# Patient Record
Sex: Female | Born: 1955
Health system: Southern US, Community
[De-identification: ages and names within clinical notes are randomized; demographics above are authoritative.]

## PROBLEM LIST (undated history)

## (undated) DIAGNOSIS — R011 Cardiac murmur, unspecified: Secondary | ICD-10-CM

## (undated) DIAGNOSIS — I10 Essential (primary) hypertension: Secondary | ICD-10-CM

## (undated) DIAGNOSIS — E119 Type 2 diabetes mellitus without complications: Secondary | ICD-10-CM

## (undated) DIAGNOSIS — G4730 Sleep apnea, unspecified: Secondary | ICD-10-CM

## (undated) DIAGNOSIS — E785 Hyperlipidemia, unspecified: Secondary | ICD-10-CM

## (undated) DIAGNOSIS — F32A Depression, unspecified: Secondary | ICD-10-CM

## (undated) DIAGNOSIS — R000 Tachycardia, unspecified: Secondary | ICD-10-CM

## (undated) DIAGNOSIS — I499 Cardiac arrhythmia, unspecified: Secondary | ICD-10-CM

## (undated) DIAGNOSIS — F419 Anxiety disorder, unspecified: Secondary | ICD-10-CM

## (undated) HISTORY — DX: Hyperlipidemia, unspecified: E78.5

## (undated) HISTORY — DX: Type 2 diabetes mellitus without complications: E11.9

## (undated) HISTORY — DX: Essential (primary) hypertension: I10

## (undated) HISTORY — PX: APPENDECTOMY: SHX54

---
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
## 1978-07-29 DIAGNOSIS — A159 Respiratory tuberculosis unspecified: Secondary | ICD-10-CM

## 1978-07-29 HISTORY — DX: Respiratory tuberculosis unspecified: A15.9

## 1998-06-06 ENCOUNTER — Other Ambulatory Visit: Admission: RE | Admit: 1998-06-06 | Discharge: 1998-06-06 | Payer: Self-pay | Admitting: Obstetrics & Gynecology

## 1999-01-13 ENCOUNTER — Encounter: Payer: Self-pay | Admitting: Emergency Medicine

## 1999-01-13 ENCOUNTER — Emergency Department (HOSPITAL_COMMUNITY): Admission: EM | Admit: 1999-01-13 | Discharge: 1999-01-13 | Payer: Self-pay | Admitting: Emergency Medicine

## 1999-06-28 ENCOUNTER — Other Ambulatory Visit: Admission: RE | Admit: 1999-06-28 | Discharge: 1999-06-28 | Payer: Self-pay | Admitting: Obstetrics & Gynecology

## 1999-07-29 ENCOUNTER — Ambulatory Visit (HOSPITAL_COMMUNITY): Admission: RE | Admit: 1999-07-29 | Discharge: 1999-07-29 | Payer: Self-pay | Admitting: *Deleted

## 2000-04-24 ENCOUNTER — Encounter: Payer: Self-pay | Admitting: Emergency Medicine

## 2000-04-24 ENCOUNTER — Emergency Department (HOSPITAL_COMMUNITY): Admission: EM | Admit: 2000-04-24 | Discharge: 2000-04-24 | Payer: Self-pay | Admitting: Emergency Medicine

## 2000-09-04 ENCOUNTER — Other Ambulatory Visit: Admission: RE | Admit: 2000-09-04 | Discharge: 2000-09-04 | Payer: Self-pay | Admitting: Obstetrics & Gynecology

## 2000-11-13 ENCOUNTER — Encounter: Payer: Self-pay | Admitting: Emergency Medicine

## 2000-11-13 ENCOUNTER — Emergency Department (HOSPITAL_COMMUNITY): Admission: EM | Admit: 2000-11-13 | Discharge: 2000-11-13 | Payer: Self-pay | Admitting: Emergency Medicine

## 2002-02-08 ENCOUNTER — Emergency Department (HOSPITAL_COMMUNITY): Admission: EM | Admit: 2002-02-08 | Discharge: 2002-02-08 | Payer: Self-pay | Admitting: *Deleted

## 2002-04-21 ENCOUNTER — Other Ambulatory Visit: Admission: RE | Admit: 2002-04-21 | Discharge: 2002-04-21 | Payer: Self-pay | Admitting: Obstetrics & Gynecology

## 2006-12-19 ENCOUNTER — Ambulatory Visit (HOSPITAL_COMMUNITY): Admission: RE | Admit: 2006-12-19 | Discharge: 2006-12-19 | Payer: Self-pay | Admitting: Obstetrics & Gynecology

## 2007-04-28 ENCOUNTER — Encounter: Admission: RE | Admit: 2007-04-28 | Discharge: 2007-04-28 | Payer: Self-pay | Admitting: Obstetrics & Gynecology

## 2007-12-09 ENCOUNTER — Ambulatory Visit: Payer: Self-pay | Admitting: Cardiovascular Disease

## 2007-12-10 ENCOUNTER — Encounter: Payer: Self-pay | Admitting: Cardiovascular Disease

## 2007-12-10 ENCOUNTER — Ambulatory Visit: Payer: Self-pay

## 2007-12-22 ENCOUNTER — Ambulatory Visit: Payer: Self-pay | Admitting: Internal Medicine

## 2008-01-22 ENCOUNTER — Encounter: Admission: RE | Admit: 2008-01-22 | Discharge: 2008-01-22 | Payer: Self-pay | Admitting: Obstetrics & Gynecology

## 2008-07-18 DIAGNOSIS — E782 Mixed hyperlipidemia: Secondary | ICD-10-CM | POA: Insufficient documentation

## 2008-07-18 DIAGNOSIS — I471 Supraventricular tachycardia: Secondary | ICD-10-CM | POA: Insufficient documentation

## 2008-07-18 DIAGNOSIS — R002 Palpitations: Secondary | ICD-10-CM | POA: Insufficient documentation

## 2008-07-18 DIAGNOSIS — I10 Essential (primary) hypertension: Secondary | ICD-10-CM | POA: Insufficient documentation

## 2009-09-19 ENCOUNTER — Encounter: Admission: RE | Admit: 2009-09-19 | Discharge: 2009-09-19 | Payer: Self-pay | Admitting: Family Medicine

## 2009-11-10 ENCOUNTER — Emergency Department (HOSPITAL_COMMUNITY): Admission: EM | Admit: 2009-11-10 | Discharge: 2009-11-10 | Payer: Self-pay | Admitting: Emergency Medicine

## 2009-11-21 ENCOUNTER — Ambulatory Visit: Payer: Self-pay | Admitting: Internal Medicine

## 2009-11-21 DIAGNOSIS — I951 Orthostatic hypotension: Secondary | ICD-10-CM | POA: Insufficient documentation

## 2009-11-21 DIAGNOSIS — R079 Chest pain, unspecified: Secondary | ICD-10-CM | POA: Insufficient documentation

## 2009-11-21 DIAGNOSIS — R55 Syncope and collapse: Secondary | ICD-10-CM | POA: Insufficient documentation

## 2009-11-27 ENCOUNTER — Encounter: Payer: Self-pay | Admitting: Internal Medicine

## 2009-11-29 ENCOUNTER — Telehealth (INDEPENDENT_AMBULATORY_CARE_PROVIDER_SITE_OTHER): Payer: Self-pay | Admitting: *Deleted

## 2009-11-30 ENCOUNTER — Encounter (HOSPITAL_COMMUNITY): Admission: RE | Admit: 2009-11-30 | Discharge: 2010-01-26 | Payer: Self-pay | Admitting: Internal Medicine

## 2009-11-30 ENCOUNTER — Ambulatory Visit: Payer: Self-pay | Admitting: Internal Medicine

## 2009-11-30 ENCOUNTER — Encounter (INDEPENDENT_AMBULATORY_CARE_PROVIDER_SITE_OTHER): Payer: Self-pay | Admitting: *Deleted

## 2009-11-30 ENCOUNTER — Ambulatory Visit: Payer: Self-pay

## 2010-02-12 ENCOUNTER — Ambulatory Visit: Payer: Self-pay | Admitting: Internal Medicine

## 2010-06-29 ENCOUNTER — Encounter: Admission: RE | Admit: 2010-06-29 | Discharge: 2010-06-29 | Payer: Self-pay | Admitting: Family Medicine

## 2010-07-20 ENCOUNTER — Encounter
Admission: RE | Admit: 2010-07-20 | Discharge: 2010-07-20 | Payer: Self-pay | Source: Home / Self Care | Attending: Family Medicine | Admitting: Family Medicine

## 2010-08-28 NOTE — Miscellaneous (Signed)
Summary: Orders Update  Clinical Lists Changes  Orders: Added new Referral order of Nuclear Stress Test (Nuc Stress Test) - Signed 

## 2010-08-28 NOTE — Letter (Signed)
Summary: Outpatient Coinsurance Notice  Outpatient Coinsurance Notice   Imported By: Marylou Mccoy 12/01/2009 15:33:35  _____________________________________________________________________  External Attachment:    Type:   Image     Comment:   External Document

## 2010-08-28 NOTE — Assessment & Plan Note (Signed)
Summary: f2y   CC:  pt passed out February 6 of this year.  This had caused pt to have a concussion. Pt quit smoking March 17 and 2011.Marland Kitchen  History of Present Illness: [name] is seen at the request of Dr. Tenny Craw because of syncope.  We first met many years ago when she had SVT. It was a adenosin sensitive. She had recurrent episode of SVT for which Dr. Tenny Craw obtained electrogram. This was sinus tachycardia.  Her issues now are quite different. It turns that she has a long-standing I. a greater than 30 year history of hypertension.   She's had problems with orthostatic intolerance for some time but in February she fell and cut her head on a chair. EMS was called the patient remembers being told her blood pressure was low her husband has no recollection. Since that time she is recorded her blood pressures at home and as noted blood pressure falls from sitting to standing of 30-50 mm this is with heart rate increases of 10-30 beats per minute. These have been symptomatic. She also has shower intolerance;  standing intolerance and heat intolerance. in the interim, her blood pressure medications have had to be down titrated.   She has a severe back problems she spends a lot of her time in bed.    She's also had significant problems of late with dyspnea on exertion as well as exertional chest discomfort. These have worsened over the last 6-12 months.  She has a strong family history of heart disease. She is abnormal cholesterol. she did stop smoking a couple of months ago.  After recent fall, she was evaluated in the hospital. She was noted to have vascular disease after her carotid arteries were noted to be calcified on x-ray. She was seen by Dr. early  Current Medications (verified): 1)  Activella 1-0.5 Mg Tabs (Estradiol-Norethindrone Acet) .... Once Daily 2)  Labetalol Hcl 300 Mg Tabs (Labetalol Hcl) .... Take 1/2 Tablet Two Times A Day 3)  Micardis Hct 80-12.5 Mg Tabs (Telmisartan-Hctz) ....  1/2 Tablet Once Daily 4)  Pravachol 20 Mg Tabs (Pravastatin Sodium) .... Take One Tablet Once Daily 5)  Sertraline Hcl 50 Mg Tabs (Sertraline Hcl) .... Take One Tablet Once Daily 6)  Vicodin 5-500 Mg Tabs (Hydrocodone-Acetaminophen) .... As Needed For Pain 7)  Xanax 1 Mg Tabs (Alprazolam) .... Take One Tablet Daily  Allergies (verified): 1)  ! Penicillin  Past History:  Past Medical History: Last updated: 30-Jul-2008 PSVT smoking clinical bronchitis Anxiety Hyperlipidemia Hypertension  Family History: Last updated: 07/30/2008 Mother: Deceased at 38 Heart attack brother alive has blockage Father: father alive had bypass surgery  Social History: Last updated: 11/21/2009 Ongoing heavy cigarrete use. Retired  Tobacco Use - Yes. recently stopped 4 children  Past Surgical History: appendectomy Abdominal surgery Pregnancy surgeries Rectocele Tonsillectomy  Social History: Ongoing heavy cigarrete use. Retired  Tobacco Use - Yes. recently stopped 4 children  Vital Signs:  Patient profile:   55 year old female Height:      56 inches Weight:      158 pounds BMI:     35.55 Pulse rate:   61 / minute Pulse rhythm:   regular BP sitting:   140 / 72  (left arm) Cuff size:   large  Vitals Entered By: Judithe Modest CMA (November 21, 2009 3:35 PM)  Physical Exam  General:  Alert and oriented middle aged caucasion female appearing older than her stated age in no acute distress. HEENT  normal . Neck veins were flat; carotids brisk and full without bruits. No lymphadenopathy. Back without kyphosis. Lungs clear. Heart sounds regular without murmurs or gallops. PMI nondisplaced. Abdomen soft with active bowel sounds without midline pulsation or hepatomegaly. Femoral pulses and distal pulses intact. Extremities were without clubbing cyanosis or edemaSkin warm and dry. Neurological exam grossly normal. affect flat     Impression & Recommendations:  Problem # 1:  HYPOTENSION,  ORTHOSTATIC (ICD-458.0) Pt has significant orthostasis which she documents with her home machine.  In the context of long-standing hypertension this is likely related to vascular stiffness aggravated by her prolonged bed rest issues.  Treatment options would include raising the head of bed 6 inches, increasing volume, using isometric maneuvers, It and not hot baths, as well as possibly Mestinon. I would like to avoid medications right now. Also like to avoid compression hose.  Problem # 2:  CHEST PAIN (ICD-786.50) The patient has multiple risk factors for coronary disease. She has exertional chest discomfort and shortness of breath. I was to rule out recurrent admissions to undergo Myoview scanning. She is unable to walk because of her back. We will need to look at structural integrity of her heart and evidence of diastolic heart failure by 2-D echocardiography.  congratulations on worse and she stop smoking Her updated medication list for this problem includes:    Labetalol Hcl 300 Mg Tabs (Labetalol hcl) .Marland Kitchen... Take 1/2 tablet two times a day  Problem # 3:  HYPERTENSION, BENIGN (ICD-401.1) her blood pressure is quite significant. At this point her symptoms of dizziness are overwhelming and I agree with the don't titration of her antihypertensives. Her updated medication list for this problem includes:    Labetalol Hcl 300 Mg Tabs (Labetalol hcl) .Marland Kitchen... Take 1/2 tablet two times a day    Micardis Hct 80-12.5 Mg Tabs (Telmisartan-hctz) .Marland Kitchen... 1/2 tablet once daily  Other Orders: EKG w/ Interpretation (93000)  Patient Instructions: 1)  Your physician recommends that you schedule a follow-up appointment in: 3 months with Dr Graciela Husbands.

## 2010-08-28 NOTE — Assessment & Plan Note (Signed)
Summary: Cardiology Nuclear Study  Nuclear Med Background Indications for Stress Test: Evaluation for Ischemia   History: Myocardial Perfusion Study  History Comments: '03 ZOX:WRUEAV, EF=64%; h/o PSVT  Symptoms: Chest Pain, Chest Pain with Exertion, Dizziness, DOE, Palpitations, Rapid HR, SOB, Syncope  Symptoms Comments: Last episode of CP:2 days ago.   Nuclear Pre-Procedure Cardiac Risk Factors: Carotid Disease, Family History - CAD, History of Smoking, Hypertension, Lipids Caffeine/Decaff Intake: None NPO After: 7:30 PM Lungs: Clear.  O2 Sat 98% on RA. IV 0.9% NS with Angio Cath: 22g     IV Site: (R) AC IV Started by: Irean Hong RN Chest Size (in) 36     Cup Size C     Height (in): 63 Weight (lb): 161 BMI: 28.62 Tech Comments: Labetalol held x 12 hours. Recently quit smoking, 10/12/09.  Nuclear Med Study 1 or 2 day study:  1 day     Stress Test Type:  Eugenie Birks Reading MD:  Arvilla Meres, MD     Referring MD:  Berton Mount, MD Resting Radionuclide:  Technetium 13m Tetrofosmin     Resting Radionuclide Dose:  11.0 mCi  Stress Radionuclide:  Technetium 96m Tetrofosmin     Stress Radionuclide Dose:  33.0 mCi   Stress Protocol   Lexiscan: 0.4 mg   Stress Test Technologist:  Rea College CMA-N     Nuclear Technologist:  Domenic Polite CNMT  Rest Procedure  Myocardial perfusion imaging was performed at rest 45 minutes following the intravenous administration of Myoview Technetium 32m Tetrofosmin.  Stress Procedure  The patient received IV Lexiscan 0.4 mg over 15-seconds.  Myoview injected at 30-seconds.  There were minor nonspecific ST-T wave changes with lexiscan.  She did c/o chest tightness with lexiscan.  Quantitative spect images were obtained after a 45 minute delay.  QPS Raw Data Images:  Normal; no motion artifact; normal heart/lung ratio. Stress Images:  There is normal uptake in all areas. Rest Images:  Normal homogeneous uptake in all areas of the  myocardium. Subtraction (SDS):  Normal Transient Ischemic Dilatation:  1.06  (Normal <1.22)  Lung/Heart Ratio:  .24  (Normal <0.45)  Quantitative Gated Spect Images QGS EDV:  101 ml QGS ESV:  36 ml QGS EF:  64 % QGS cine images:  Normal  Findings Normal nuclear study      Overall Impression  Exercise Capacity: Lexiscan study with no exercise. ECG Impression: Baseline: NSR; No significant ST segment change with Lexiscan. Overall Impression: Normal stress nuclear study.  Appended Document: Cardiology Nuclear Study Patient notified.

## 2010-08-28 NOTE — Assessment & Plan Note (Signed)
Summary: 3 month rov/sl   CC:  3 month rov.  Pt states she is a little stressed at this time.  She is having a hard time regulating her BP and pulse.  Marland Kitchen  History of Present Illness: [name] is seen in followup for hypertension and orthostatic hypotension as well as a remote history of adenosine sensitive to SVT.  In the interim she has stopped smoking. Her blood pressure is now running lower. She feels her dizziness however is still a problem. Theree remains dyspnea on exertion.  Current Medications (verified): 1)  Activella 1-0.5 Mg Tabs (Estradiol-Norethindrone Acet) .... Once Daily 2)  Labetalol Hcl 300 Mg Tabs (Labetalol Hcl) .... Take 1/2 Tablet Once Daily 3)  Micardis Hct 80-12.5 Mg Tabs (Telmisartan-Hctz) .... 1/2 Tablet Once Daily 4)  Pravachol 20 Mg Tabs (Pravastatin Sodium) .... Take One Tablet Once Daily 5)  Sertraline Hcl 50 Mg Tabs (Sertraline Hcl) .... Take One Tablet Once Daily 6)  Hydrocodone-Acetaminophen 7.5-500 Mg Tabs (Hydrocodone-Acetaminophen) .... Take One Tablet As Needed For Pain 7)  Xanax 1 Mg Tabs (Alprazolam) .... Take One Tablet Daily  Allergies (verified): 1)  ! Penicillin  Past History:  Past Medical History: Last updated: 15-Aug-2008 PSVT smoking clinical bronchitis Anxiety Hyperlipidemia Hypertension  Past Surgical History: Last updated: 11/21/2009 appendectomy Abdominal surgery Pregnancy surgeries Rectocele Tonsillectomy  Family History: Last updated: Aug 15, 2008 Mother: Deceased at 78 Heart attack brother alive has blockage Father: father alive had bypass surgery  Social History: Last updated: 11/21/2009 Ongoing heavy cigarrete use. Retired  Tobacco Use - Yes. recently stopped 4 children  Vital Signs:  Patient profile:   55 year old female Height:      63 inches Weight:      161 pounds BMI:     28.62 Pulse rate:   70 / minute Pulse (ortho):   78 / minute Pulse rhythm:   regular BP sitting:   123 / 82  (left arm) BP  standing:   93 / 69  (left arm) Cuff size:   regular  Vitals Entered By: Judithe Modest CMA (February 12, 2010 10:33 AM)  Physical Exam  General:  The patient was alert and oriented in no acute distress. HEENT Normal.  Neck veins were flat, carotids were brisk.  Lungs were clear.  Heart sounds were regular without murmurs or gallops.  Abdomen was soft with active bowel sounds. There is no clubbing cyanosis or edema. Skin Warm and dry    Impression & Recommendations:  Problem # 1:  HYPERTENSION, BENIGN (ICD-401.1) the patient's blood pressure is much improved following her stopping smoking. We'll plan to discontinue her labetalol and her my card is at this time. She is to follow up with Dr. Tenny Craw in just a few weeks and she will take blood pressure recordings with her to him. The following medications were removed from the medication list:    Labetalol Hcl 300 Mg Tabs (Labetalol hcl) .Marland Kitchen... Take 1/2 tablet once daily    Micardis Hct 80-12.5 Mg Tabs (Telmisartan-hctz) .Marland Kitchen... 1/2 tablet once daily  Problem # 2:  HYPOTENSION, ORTHOSTATIC (ICD-458.0) her orthostasis remains a problem and this may now be aggravated by her relatively low blood pressure. See above  Problem # 3:  PSVT (ICD-427.0) no recurrent SVT The following medications were removed from the medication list:    Labetalol Hcl 300 Mg Tabs (Labetalol hcl) .Marland Kitchen... Take 1/2 tablet once daily  Patient Instructions: 1)  Your physician has recommended you make the following change in your medication:  Stop Labetolol and Micardis 2)  Your physician recommends that you schedule a follow-up appointment in: as needed.

## 2010-08-28 NOTE — Progress Notes (Signed)
Summary: Nuclear Pre-Procedure  Phone Note Outgoing Call   Call placed by: Milana Na, EMT-P,  Nov 29, 2009 3:35 PM Summary of Call: Reviewed information on Myoview Information Sheet (see scanned document for further details).  Spoke with paient.     Nuclear Med Background Indications for Stress Test: Evaluation for Ischemia   History: Myocardial Perfusion Study  History Comments: '03 MPS NL EF 64% H/O PSVT  Symptoms: Chest Pain, Chest Pain with Exertion, Syncope    Nuclear Pre-Procedure Cardiac Risk Factors: Carotid Disease, Family History - CAD, History of Smoking, Hypertension, Lipids Height (in): 56  Nuclear Med Study Referring MD:  S.Klein

## 2010-10-16 LAB — DIFFERENTIAL
Basophils Absolute: 0 10*3/uL (ref 0.0–0.1)
Basophils Relative: 0 % (ref 0–1)
Eosinophils Absolute: 0.2 10*3/uL (ref 0.0–0.7)
Eosinophils Relative: 2 % (ref 0–5)
Lymphocytes Relative: 28 % (ref 12–46)
Lymphs Abs: 2.5 10*3/uL (ref 0.7–4.0)
Monocytes Absolute: 0.7 10*3/uL (ref 0.1–1.0)
Monocytes Relative: 8 % (ref 3–12)
Neutro Abs: 5.8 10*3/uL (ref 1.7–7.7)
Neutrophils Relative %: 62 % (ref 43–77)

## 2010-10-16 LAB — URINE MICROSCOPIC-ADD ON

## 2010-10-16 LAB — CBC
HCT: 40.4 % (ref 36.0–46.0)
Hemoglobin: 14.4 g/dL (ref 12.0–15.0)
MCHC: 35.5 g/dL (ref 30.0–36.0)
MCV: 92.9 fL (ref 78.0–100.0)
Platelets: 157 10*3/uL (ref 150–400)
RBC: 4.35 MIL/uL (ref 3.87–5.11)
RDW: 12.9 % (ref 11.5–15.5)
WBC: 9.3 10*3/uL (ref 4.0–10.5)

## 2010-10-16 LAB — POCT I-STAT, CHEM 8
BUN: 18 mg/dL (ref 6–23)
Calcium, Ion: 1.07 mmol/L — ABNORMAL LOW (ref 1.12–1.32)
Chloride: 105 mEq/L (ref 96–112)
Creatinine, Ser: 0.6 mg/dL (ref 0.4–1.2)
Glucose, Bld: 125 mg/dL — ABNORMAL HIGH (ref 70–99)
HCT: 44 % (ref 36.0–46.0)
Hemoglobin: 15 g/dL (ref 12.0–15.0)
Potassium: 4.2 mEq/L (ref 3.5–5.1)
Sodium: 139 mEq/L (ref 135–145)
TCO2: 25 mmol/L (ref 0–100)

## 2010-10-16 LAB — URINALYSIS, ROUTINE W REFLEX MICROSCOPIC
Bilirubin Urine: NEGATIVE
Glucose, UA: NEGATIVE mg/dL
Ketones, ur: NEGATIVE mg/dL
Leukocytes, UA: NEGATIVE
Nitrite: NEGATIVE
Protein, ur: NEGATIVE mg/dL
Specific Gravity, Urine: 1.006 (ref 1.005–1.030)
Urobilinogen, UA: 0.2 mg/dL (ref 0.0–1.0)
pH: 6 (ref 5.0–8.0)

## 2010-10-16 LAB — POCT CARDIAC MARKERS
CKMB, poc: <1 ng/mL — ABNORMAL LOW (ref 1.0–8.0)
Myoglobin, poc: 56.7 ng/mL (ref 12–200)
Troponin i, poc: <0.05 ng/mL (ref 0.00–0.09)

## 2010-12-11 NOTE — Letter (Signed)
Dec 22, 2007    C. Duane Lope, M.D.  [name]  [place], [place] [address]   RE:  [name], [name]  MRN:  [id number]  /  DOB:  1955-08-09   Dear [name]:   I hope this letter finds you and your family well.  I hope you are  looking to having some time with the family for the summer.   [name] was seen in your office on April 23 because of tachycardia  occurring the day after colonoscopy.  She then got seen by Charlton Haws  here in our office on the 13th who asked her to come back to see me.  I  had seen her in 2003 for recurrent supraventricular tachycardia and  there is a note referring to a tachycardia terminated with adenosine  although I do not have a copy of the strips for this.  She says the  episodes that she had around the time of her colonoscopy was identical  to the prior strip.  When she got to your office, the electrocardiogram  that was obtained demonstrates what appears to be a sinus rhythm  although the P-wave morphology leads V1 and V2 are both monophasic  upright as opposed to the typical biphasic, suggesting that this may  have been an atrial tachycardia.   Given the fact that this is her first episode in six years, her desire  was to continue doing what she is doing and not pursue catheter  ablation.   Her past medical history is notable for:  1. Ongoing heavy cigarette use.  2. Normal thyroid status.  3. Recent normal echocardiogram apart from flattening of the mitral      valve.   REVIEW OF SYSTEMS:  Is notable for significant psychosocial social  stress related to her children.   Her medications currently include:  1. Micardis 80/12.5.  2. Labetalol 300 b.i.d.  3. Diltiazem 360 a day.  4. Activella.  5. Aspirin 81.   SHE IS ALLERGIC TO PENICILLIN.   On examination today, she is a middle-aged Caucasian female appearing  her stated age of 52.  Her blood pressure was 112/60, her weight was  159, and her pulse was 68.  Her neck veins were  flat.  Her carotids were brisk and full bilaterally  without bruits.  HEENT EXAM:  Demonstrated no icterus or xanthomata.  BACK:  Without kyphosis or scoliosis.  LUNGS:  Were clear.  HEART:  Sounds were regular without murmurs or gallops.  ABDOMEN:  Soft with active bowel sounds.  EXTREMITIES:  Without edema.  NEUROLOGICAL EXAM:  Was grossly normal.   IMPRESSION:  1. Supraventricular tachycardia by history.  2. Electrocardiogram demonstrated sinus tachycardia at your office.  3. Hypertension.  4. Recent intercurrent weight loss - intentional.  5. Ongoing cigarette abuse.   [name], [name] has a history of supraventricular tachycardia that was  adenosine sensitive.  We do not have strips of that available.  She says  the episode that she had in the office was similar to that, although the  recorded rhythm was probably a sinus tachycardia.  This means either  that:  (a) the tachycardia had terminated prior to getting to the  office, (b) she has different tachycardia syndrome, (c) that this is in  fact an atrial tachycardia that was adenosine sensitive.  I am not sure  which of these it is; however, I am not sure that it matters a great  deal in  that she would like to do nothing at the current time given the  fact there has been a six-year hiatus since her last episode.   I offered her the opportunity to come back and see [place] in a year to two  years' time.  She said she would just like to follow up with you and get  back with [place] if there is a need.   Thank you very much for allowing [place] to participate in her care.    Sincerely,      Kelly Salvia, MD, [facility]  Electronically Signed    SCK/MedQ  DD: 12/22/2007  DT: 12/22/2007  Job #: [id number]

## 2010-12-11 NOTE — Assessment & Plan Note (Signed)
[facility]                            CARDIOLOGY OFFICE NOTE   [name]                          MRN:          [id number]  DATE:12/09/2007                            DOB:          [birth year]    [name] is a 55 year old patient referred for palpitations.  She has  a history of MVP.  She has seen Dr. Graciela Husbands before.  I reviewed his old  notes.  The patient has had an emergency room visit with PSVT that broke  with adenosine.  Dr. Odessa Fleming note from August 2003 indicated that he  discussed catheter ablation with her.  She was a bit scared to wave the  small likelihood of needing a pacemaker.   She has not had a lot of episodes.  The patient had a colonoscopy on  November 18, 2007, with polypectomy.  After this, on November 19, 2006, she  had tachycardia.  There was somewhat sudden onset.  The tachycardia  slowed over the course of 3-4 hours and then did not recur.  She does  not get very frequent episodes of tachycardia.  There is no associated  presyncope, chest pain, she does get some shortness of breath with it.   She has had normal thyroid studies.  She is a very heavy smoker, two  packs a day, with significant bronchitis.  She does not take any other  stimulants or drugs.   Since seeing her doctor on November 19, 2007, being referred back here, she  has not had any recurrences.  There has also been a lot of stress around  the house.  She has four daughters, one of them recently moved back in.  The husband's parents recently had to be moved to a nursing home.  There  seemed to be a lot of stress at home.   REVIEW OF SYSTEMS:  Otherwise negative.  She has had a normal stress  test back in July 2003.  There is a questionable history of mitral valve  prolapse but I do not have an echocardiogram on the patient.   She previously has had endocarditis prophylaxis but did not receive any  during her colonoscopy.   PAST MEDICAL HISTORY:  Remarkable  for  1. Significant smoking and clinical bronchitis.  2. Hypertension.  3. Hyperlipidemia.  4. PSVT.   ALLERGIES:  She is allergic to PENICILLIN.   MEDICATIONS:  She is on  1. Micardis 80/12.5.  2. Labetalol 300 b.i.d.  3. Cardiac C 360 Activella.   SOCIAL HISTORY:  She is retired from the [employer].  She is  married.  Her husband is with her today.  She has four daughter.  She  still seems to be under a lot of stress.  She smokes a pack a day and  does not drink.  She is otherwise not very active.   FAMILY HISTORY:  Noncontributory.   PHYSICAL EXAMINATION:  GENERAL APPEARANCE:  Remarkable for a bronchitic  white female in no distress.  VITAL SIGNS:  Her weight is 158, blood pressure is 130/70, pulse 71 and  regular,  afebrile, respiratory rate 14.  HEENT:  Unremarkable.  NECK:  Carotids are normal without bruit, no lymphadenopathy,  thyromegaly or JVP elevation.  LUNGS:  Expiratory wheezes and rhonchi.  CARDIOVASCULAR:  S1-S2 with normal heart sounds.  No evidence of MVP or  murmur.  PMI normal.  ABDOMEN:  Benign.  Bowel sounds positive.  No AAA no tenderness, no  bruit, no hepatosplenomegaly or hepatojugular reflux, no tenderness.  EXTREMITIES:  Distal pulses are intact, no edema.  NEURO:  Nonfocal.  SKIN:  Warm and dry.  MUSCULOSKELETAL:  No muscular weakness.   Baseline EKG shows sinus rhythm with nonspecific ST-T wave changes.   I reviewed her records from the family practice   IMPRESSION:  1. Presumed paroxysmal supraventricular tachycardia, infrequent      recurrence possibly precipitated by nicotine and stress.  Continue      beta-blocker and Cartia.  Follow-up with Dr. Graciela Husbands to reassess need      for ablation, although I think the patient will not want to do      this.  2. Question history of mitral valve prolapse, no murmur on exam.      Given her recent paroxysmal supraventricular tachycardia, we will      repeat an echo since we have none in the chart  and assess right      ventricular and left ventricular function and rule out mitral valve      prolapse.  She does not need subacute bacterial endocarditis      prophylaxis in either case.  3. Anxiety.  Lorazepam p.r.n.  4. History of colonoscopy with polyp removal.  Probable follow-up      colonoscopy in 3 years per gastrointestinal.  5. Clinical bronchitis and smoking.  Follow up with primary care      physician.  She has failed Chantix in the past.  May be a candidate      for Wellbutrin.  We will do a chest x-ray today since she has not      had one in a year and I am concerned about her risk of developing      lung cancer.   The patient will follow up with Dr. Graciela Husbands.     Kelly Pick. Eden Emms, MD, [facility]  Electronically Signed    PCN/MedQ  DD: 12/09/2007  DT: 12/09/2007  Job #: [id number]   cc:   Raechel Ache, M.D.

## 2010-12-14 NOTE — Op Note (Signed)
NAMEAMBRIELLE, [name]                 ACCOUNT NO.:  [id number]   MEDICAL RECORD NO.:  [id number]          PATIENT TYPE:  AMB   LOCATION:  SDC                           FACILITY:  [facility]   PHYSICIAN:  Freddy Finner, M.D.   DATE OF BIRTH:  1955/09/11   DATE OF PROCEDURE:  12/19/2006  DATE OF DISCHARGE:                               OPERATIVE REPORT   PREOPERATIVE DIAGNOSIS:  Rectocele with prolapse.   POSTOPERATIVE DIAGNOSIS:  Rectocele with prolapse.   OPERATIVE PROCEDURE:  Posterior vaginal repair.   SURGEON:  Freddy Finner, M.D.   ANESTHESIA:  General.   ESTIMATED INTRAOPERATIVE BLOOD LOSS:  60 mL.   INTRAOPERATIVE COMPLICATIONS:  None.   The patient is a 55 year old who presented with a complaint of prolapse  of a rectocele. She has requested surgical intervention and is admitted  at this time for that purpose. She was admitted on the morning of  surgery. She was given a bolus of Ancef preoperatively. She was brought  to the operating room and placed under adequate general anesthesia,  placed in the dorsal lithotomy position using the [place] stirrup system.  Betadine prep using scrub followed by Betadine solution was carried out  prepping the mons, perineum and vagina. Sterile drapes were applied.  Allis's were placed to the fourchette on either side. A pyramidal-shaped  segment of skin was incised from the perineal body with the apex  inferiorly. Mucosa of the underlying rectum and rectocele was tented  with Allis's, and progressive dissection was carried out and an incision  made along the middle of the vaginal mucosa for a distance of  approximately 8 cm. The rectum was carefully freed from the vaginal  mucosa using sharp and blunt dissection. Plication sutures were then  used to approximate perirectal supportive tissue in the midline to  recreate the rectovaginal shelf. Levators were approximated with  interrupted 0 Monocryl suture. Perineal body was closed with an  interrupted 0 Monocryl suture. The mucosa was closed in a fashion  similar to episiotomy with a running 2-0 Monocryl suture running along  midline with a locking stitch and then along the perineal body to  approximate the subcutaneous tissue and then to reapproximate the mucosa  with a subcuticular stitch.   Approximately 20 mL of half percent Marcaine was then injected into the  incision site and into the deep tissue for postoperative analgesia. The  patient was awakened and taken to recovery in good condition.   The plan is to discharge her home with outpatient surgical instructions  for posterior repair. She has hydrocodone which she can take at home for  pain. She is to return to the office in approximately 2 weeks for  postoperative followup.      Freddy Finner, M.D.  Electronically Signed     WRN/MEDQ  D:  12/19/2006  T:  12/19/2006  Job:  [id number]

## 2011-11-22 ENCOUNTER — Other Ambulatory Visit: Payer: Self-pay | Admitting: Neurosurgery

## 2011-11-22 DIAGNOSIS — M5020 Other cervical disc displacement, unspecified cervical region: Secondary | ICD-10-CM

## 2014-02-16 ENCOUNTER — Other Ambulatory Visit: Payer: Self-pay | Admitting: Family Medicine

## 2014-02-16 DIAGNOSIS — I739 Peripheral vascular disease, unspecified: Principal | ICD-10-CM

## 2014-02-16 DIAGNOSIS — I779 Disorder of arteries and arterioles, unspecified: Secondary | ICD-10-CM

## 2014-02-28 ENCOUNTER — Ambulatory Visit
Admission: RE | Admit: 2014-02-28 | Discharge: 2014-02-28 | Disposition: A | Discharge status: Home / Self Care | Payer: Federal, State, Local not specified - PPO | Source: Ambulatory Visit | Attending: Family Medicine | Admitting: Family Medicine

## 2014-02-28 DIAGNOSIS — I739 Peripheral vascular disease, unspecified: Principal | ICD-10-CM

## 2014-02-28 DIAGNOSIS — I779 Disorder of arteries and arterioles, unspecified: Secondary | ICD-10-CM

## 2014-06-17 ENCOUNTER — Encounter: Payer: Self-pay | Admitting: Cardiovascular Disease

## 2014-06-17 ENCOUNTER — Encounter: Payer: Self-pay | Admitting: Internal Medicine

## 2015-12-21 DIAGNOSIS — F3342 Major depressive disorder, recurrent, in full remission: Secondary | ICD-10-CM | POA: Diagnosis not present

## 2015-12-21 DIAGNOSIS — F900 Attention-deficit hyperactivity disorder, predominantly inattentive type: Secondary | ICD-10-CM | POA: Diagnosis not present

## 2015-12-21 DIAGNOSIS — F410 Panic disorder [episodic paroxysmal anxiety] without agoraphobia: Secondary | ICD-10-CM | POA: Diagnosis not present

## 2016-06-13 DIAGNOSIS — F422 Mixed obsessional thoughts and acts: Secondary | ICD-10-CM | POA: Diagnosis not present

## 2016-06-13 DIAGNOSIS — F410 Panic disorder [episodic paroxysmal anxiety] without agoraphobia: Secondary | ICD-10-CM | POA: Diagnosis not present

## 2016-06-25 DIAGNOSIS — L089 Local infection of the skin and subcutaneous tissue, unspecified: Secondary | ICD-10-CM | POA: Diagnosis not present

## 2016-06-25 DIAGNOSIS — L723 Sebaceous cyst: Secondary | ICD-10-CM | POA: Diagnosis not present

## 2016-08-29 DIAGNOSIS — Z01419 Encounter for gynecological examination (general) (routine) without abnormal findings: Secondary | ICD-10-CM | POA: Diagnosis not present

## 2016-08-29 DIAGNOSIS — Z1231 Encounter for screening mammogram for malignant neoplasm of breast: Secondary | ICD-10-CM | POA: Diagnosis not present

## 2016-08-29 DIAGNOSIS — Z23 Encounter for immunization: Secondary | ICD-10-CM | POA: Diagnosis not present

## 2016-08-29 DIAGNOSIS — Z6833 Body mass index (BMI) 33.0-33.9, adult: Secondary | ICD-10-CM | POA: Diagnosis not present

## 2016-12-05 DIAGNOSIS — F410 Panic disorder [episodic paroxysmal anxiety] without agoraphobia: Secondary | ICD-10-CM | POA: Diagnosis not present

## 2016-12-05 DIAGNOSIS — F3342 Major depressive disorder, recurrent, in full remission: Secondary | ICD-10-CM | POA: Diagnosis not present

## 2017-03-21 DIAGNOSIS — E782 Mixed hyperlipidemia: Secondary | ICD-10-CM | POA: Diagnosis not present

## 2017-03-21 DIAGNOSIS — I10 Essential (primary) hypertension: Secondary | ICD-10-CM | POA: Diagnosis not present

## 2017-03-21 DIAGNOSIS — R000 Tachycardia, unspecified: Secondary | ICD-10-CM | POA: Diagnosis not present

## 2017-05-23 DIAGNOSIS — J209 Acute bronchitis, unspecified: Secondary | ICD-10-CM | POA: Diagnosis not present

## 2017-05-23 DIAGNOSIS — M533 Sacrococcygeal disorders, not elsewhere classified: Secondary | ICD-10-CM | POA: Diagnosis not present

## 2017-05-26 DIAGNOSIS — F3342 Major depressive disorder, recurrent, in full remission: Secondary | ICD-10-CM | POA: Diagnosis not present

## 2017-05-26 DIAGNOSIS — F410 Panic disorder [episodic paroxysmal anxiety] without agoraphobia: Secondary | ICD-10-CM | POA: Diagnosis not present

## 2017-05-28 DIAGNOSIS — S61210A Laceration without foreign body of right index finger without damage to nail, initial encounter: Secondary | ICD-10-CM | POA: Diagnosis not present

## 2017-09-10 DIAGNOSIS — Z6833 Body mass index (BMI) 33.0-33.9, adult: Secondary | ICD-10-CM | POA: Diagnosis not present

## 2017-09-10 DIAGNOSIS — Z1231 Encounter for screening mammogram for malignant neoplasm of breast: Secondary | ICD-10-CM | POA: Diagnosis not present

## 2017-09-10 DIAGNOSIS — Z01419 Encounter for gynecological examination (general) (routine) without abnormal findings: Secondary | ICD-10-CM | POA: Diagnosis not present

## 2017-11-18 DIAGNOSIS — F411 Generalized anxiety disorder: Secondary | ICD-10-CM | POA: Diagnosis not present

## 2017-11-18 DIAGNOSIS — F3342 Major depressive disorder, recurrent, in full remission: Secondary | ICD-10-CM | POA: Diagnosis not present

## 2017-11-27 DIAGNOSIS — K5904 Chronic idiopathic constipation: Secondary | ICD-10-CM | POA: Diagnosis not present

## 2017-11-27 DIAGNOSIS — R1012 Left upper quadrant pain: Secondary | ICD-10-CM | POA: Diagnosis not present

## 2017-11-27 DIAGNOSIS — K5730 Diverticulosis of large intestine without perforation or abscess without bleeding: Secondary | ICD-10-CM | POA: Diagnosis not present

## 2017-11-27 DIAGNOSIS — K625 Hemorrhage of anus and rectum: Secondary | ICD-10-CM | POA: Diagnosis not present

## 2017-12-08 DIAGNOSIS — D120 Benign neoplasm of cecum: Secondary | ICD-10-CM | POA: Diagnosis not present

## 2017-12-08 DIAGNOSIS — K635 Polyp of colon: Secondary | ICD-10-CM | POA: Diagnosis not present

## 2017-12-08 DIAGNOSIS — R1012 Left upper quadrant pain: Secondary | ICD-10-CM | POA: Diagnosis not present

## 2017-12-08 DIAGNOSIS — Z1211 Encounter for screening for malignant neoplasm of colon: Secondary | ICD-10-CM | POA: Diagnosis not present

## 2017-12-08 DIAGNOSIS — K621 Rectal polyp: Secondary | ICD-10-CM | POA: Diagnosis not present

## 2017-12-08 DIAGNOSIS — K625 Hemorrhage of anus and rectum: Secondary | ICD-10-CM | POA: Diagnosis not present

## 2017-12-19 DIAGNOSIS — M25569 Pain in unspecified knee: Secondary | ICD-10-CM | POA: Diagnosis not present

## 2018-01-06 DIAGNOSIS — M25562 Pain in left knee: Secondary | ICD-10-CM | POA: Diagnosis not present

## 2018-01-06 DIAGNOSIS — M1711 Unilateral primary osteoarthritis, right knee: Secondary | ICD-10-CM | POA: Diagnosis not present

## 2018-01-06 DIAGNOSIS — M25561 Pain in right knee: Secondary | ICD-10-CM | POA: Diagnosis not present

## 2018-01-06 DIAGNOSIS — M1712 Unilateral primary osteoarthritis, left knee: Secondary | ICD-10-CM | POA: Diagnosis not present

## 2018-01-28 DIAGNOSIS — K080 Exfoliation of teeth due to systemic causes: Secondary | ICD-10-CM | POA: Diagnosis not present

## 2018-02-03 DIAGNOSIS — M25561 Pain in right knee: Secondary | ICD-10-CM | POA: Diagnosis not present

## 2018-02-04 DIAGNOSIS — K080 Exfoliation of teeth due to systemic causes: Secondary | ICD-10-CM | POA: Diagnosis not present

## 2018-02-09 DIAGNOSIS — K080 Exfoliation of teeth due to systemic causes: Secondary | ICD-10-CM | POA: Diagnosis not present

## 2018-03-31 DIAGNOSIS — R0789 Other chest pain: Secondary | ICD-10-CM | POA: Diagnosis not present

## 2018-04-01 ENCOUNTER — Ambulatory Visit
Admission: RE | Admit: 2018-04-01 | Discharge: 2018-04-01 | Disposition: A | Discharge status: Home / Self Care | Payer: Federal, State, Local not specified - PPO | Source: Ambulatory Visit | Attending: Family Medicine | Admitting: Family Medicine

## 2018-04-01 ENCOUNTER — Other Ambulatory Visit: Payer: Self-pay | Admitting: Obstetrics and Gynecology

## 2018-04-01 DIAGNOSIS — R079 Chest pain, unspecified: Secondary | ICD-10-CM | POA: Diagnosis not present

## 2018-04-01 DIAGNOSIS — S299XXA Unspecified injury of thorax, initial encounter: Secondary | ICD-10-CM | POA: Diagnosis not present

## 2018-04-22 DIAGNOSIS — M1711 Unilateral primary osteoarthritis, right knee: Secondary | ICD-10-CM | POA: Diagnosis not present

## 2018-05-11 DIAGNOSIS — F3342 Major depressive disorder, recurrent, in full remission: Secondary | ICD-10-CM | POA: Diagnosis not present

## 2018-05-11 DIAGNOSIS — F411 Generalized anxiety disorder: Secondary | ICD-10-CM | POA: Diagnosis not present

## 2018-05-23 DIAGNOSIS — M25561 Pain in right knee: Secondary | ICD-10-CM | POA: Diagnosis not present

## 2018-05-26 DIAGNOSIS — M1711 Unilateral primary osteoarthritis, right knee: Secondary | ICD-10-CM | POA: Diagnosis not present

## 2018-05-26 DIAGNOSIS — S83241A Other tear of medial meniscus, current injury, right knee, initial encounter: Secondary | ICD-10-CM | POA: Diagnosis not present

## 2018-06-05 DIAGNOSIS — Z23 Encounter for immunization: Secondary | ICD-10-CM | POA: Diagnosis not present

## 2018-06-23 DIAGNOSIS — F419 Anxiety disorder, unspecified: Secondary | ICD-10-CM | POA: Diagnosis not present

## 2018-06-23 DIAGNOSIS — I10 Essential (primary) hypertension: Secondary | ICD-10-CM | POA: Diagnosis not present

## 2018-06-23 DIAGNOSIS — E782 Mixed hyperlipidemia: Secondary | ICD-10-CM | POA: Diagnosis not present

## 2018-06-23 DIAGNOSIS — R0602 Shortness of breath: Secondary | ICD-10-CM | POA: Diagnosis not present

## 2018-07-07 DIAGNOSIS — S83241A Other tear of medial meniscus, current injury, right knee, initial encounter: Secondary | ICD-10-CM | POA: Diagnosis not present

## 2018-09-16 DIAGNOSIS — Z1231 Encounter for screening mammogram for malignant neoplasm of breast: Secondary | ICD-10-CM | POA: Diagnosis not present

## 2018-09-16 DIAGNOSIS — Z6834 Body mass index (BMI) 34.0-34.9, adult: Secondary | ICD-10-CM | POA: Diagnosis not present

## 2018-09-16 DIAGNOSIS — Z01419 Encounter for gynecological examination (general) (routine) without abnormal findings: Secondary | ICD-10-CM | POA: Diagnosis not present

## 2018-09-17 ENCOUNTER — Other Ambulatory Visit: Payer: Self-pay | Admitting: Obstetrics & Gynecology

## 2018-09-17 DIAGNOSIS — N6310 Unspecified lump in the right breast, unspecified quadrant: Secondary | ICD-10-CM

## 2018-09-23 ENCOUNTER — Ambulatory Visit
Admission: RE | Admit: 2018-09-23 | Discharge: 2018-09-23 | Disposition: A | Discharge status: Home / Self Care | Payer: Federal, State, Local not specified - PPO | Source: Ambulatory Visit | Attending: Obstetrics & Gynecology | Admitting: Obstetrics & Gynecology

## 2018-09-23 DIAGNOSIS — N6310 Unspecified lump in the right breast, unspecified quadrant: Secondary | ICD-10-CM

## 2018-09-23 DIAGNOSIS — R928 Other abnormal and inconclusive findings on diagnostic imaging of breast: Secondary | ICD-10-CM | POA: Diagnosis not present

## 2018-09-23 DIAGNOSIS — N6312 Unspecified lump in the right breast, upper inner quadrant: Secondary | ICD-10-CM | POA: Diagnosis not present

## 2018-11-02 DIAGNOSIS — F3342 Major depressive disorder, recurrent, in full remission: Secondary | ICD-10-CM | POA: Diagnosis not present

## 2018-11-02 DIAGNOSIS — F410 Panic disorder [episodic paroxysmal anxiety] without agoraphobia: Secondary | ICD-10-CM | POA: Diagnosis not present

## 2019-04-27 DIAGNOSIS — F3342 Major depressive disorder, recurrent, in full remission: Secondary | ICD-10-CM | POA: Diagnosis not present

## 2019-04-27 DIAGNOSIS — F410 Panic disorder [episodic paroxysmal anxiety] without agoraphobia: Secondary | ICD-10-CM | POA: Diagnosis not present

## 2019-05-13 ENCOUNTER — Other Ambulatory Visit: Payer: Self-pay | Admitting: Family Medicine

## 2019-05-13 DIAGNOSIS — R911 Solitary pulmonary nodule: Secondary | ICD-10-CM

## 2019-05-13 DIAGNOSIS — R9389 Abnormal findings on diagnostic imaging of other specified body structures: Secondary | ICD-10-CM

## 2019-05-17 IMAGING — CT CT CHEST W/O CM
1 series · 14 of 34 positions shown, 18 images · non-contrast
Comparison: 03/31/2018 chest and left rib radiographs.

CLINICAL DATA: Recent fall.  Nonfocal chest pain.

EXAM:
CT CHEST WITHOUT CONTRAST
TECHNIQUE: Multidetector CT imaging of the chest was performed following the
standard protocol without IV contrast.

[Series 2: chest w/(date) · axial · 0.81mm/px · z∈[-327,-61]mm · 14 of 157 slices shown, 18 images]
[im 12/157  mediastinal]
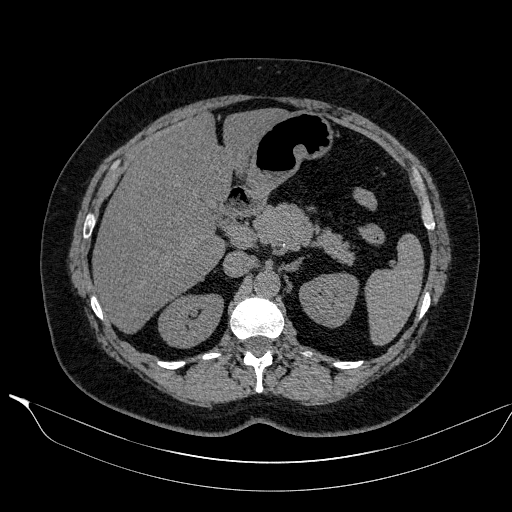
[im 12/157  lung]
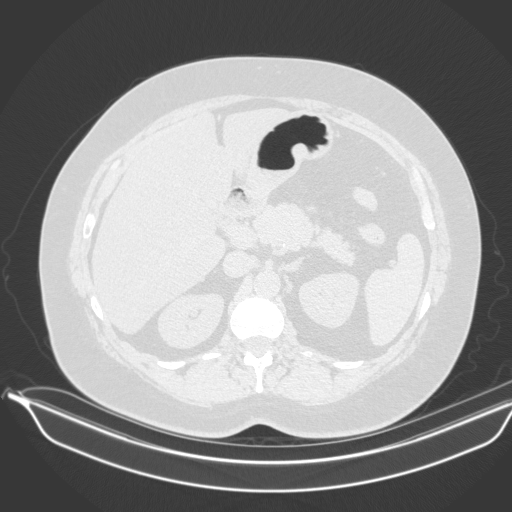
[im 24/157  lung]
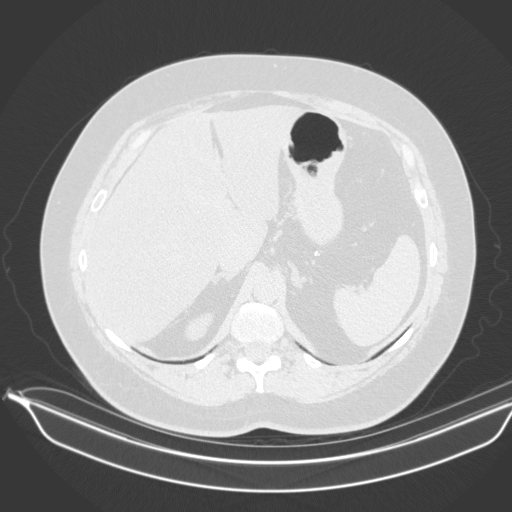
[im 32/157  lung]
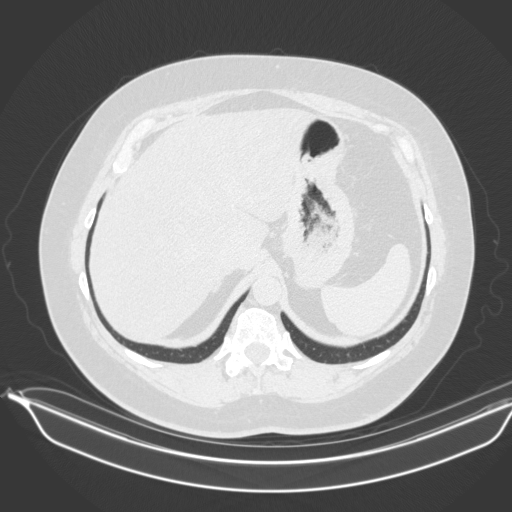
[im 47/157  lung]
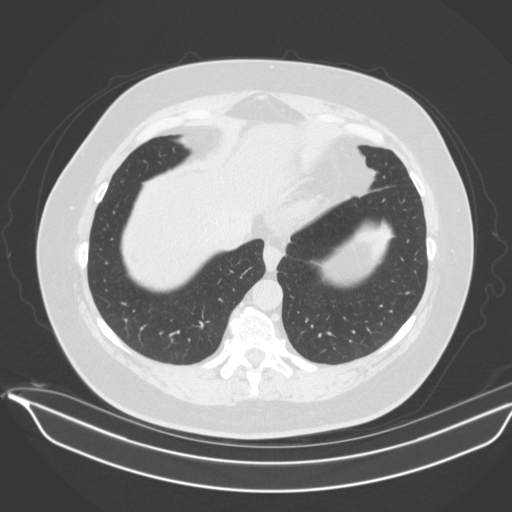
[im 58/157  mediastinal]
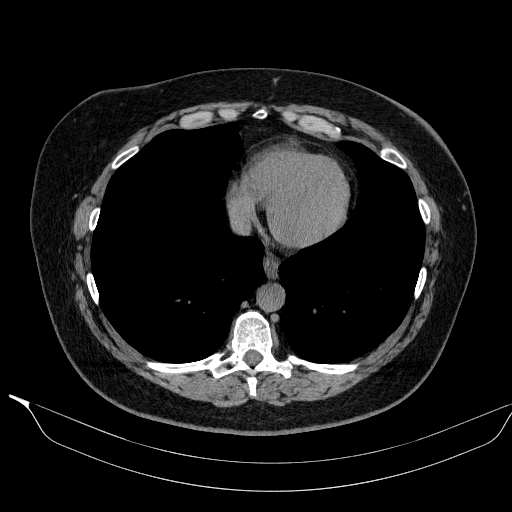
[im 58/157  lung]
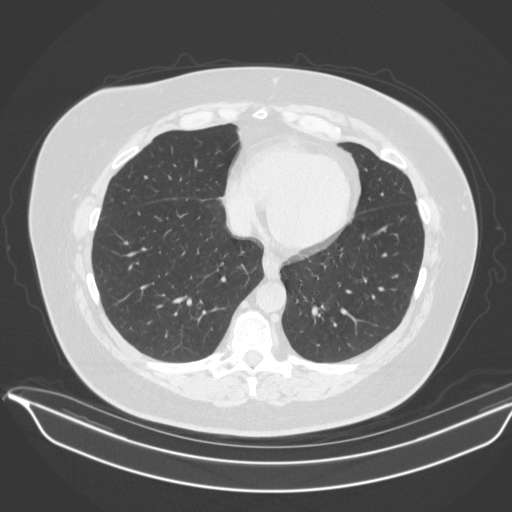
[im 64/157  lung]
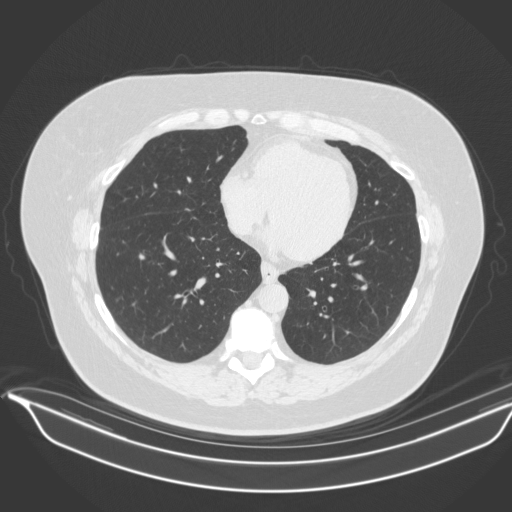
[im 74/157  lung]
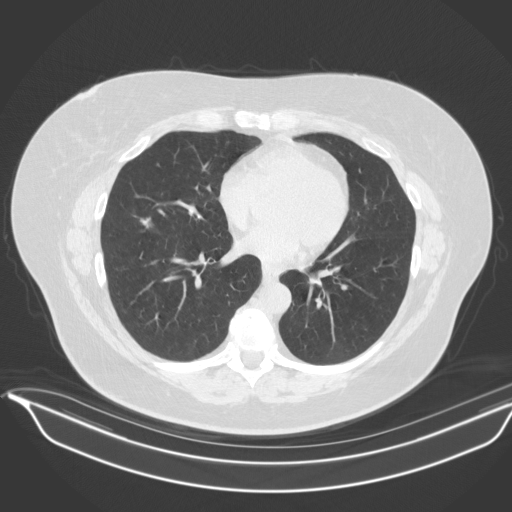
[im 84/157  lung]
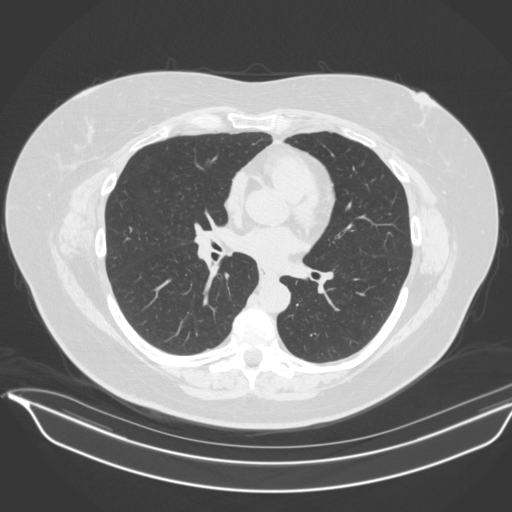
[im 93/157  mediastinal]
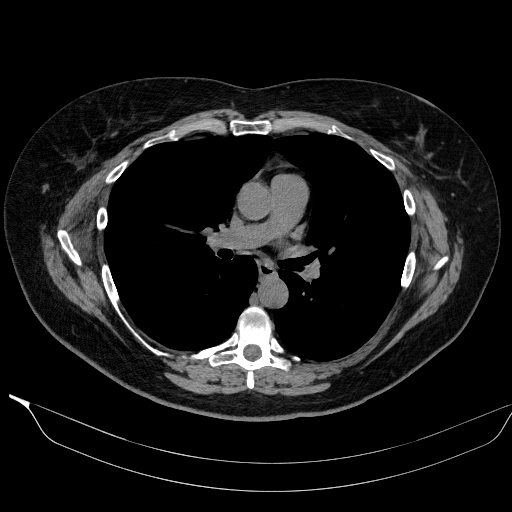
[im 93/157  lung]
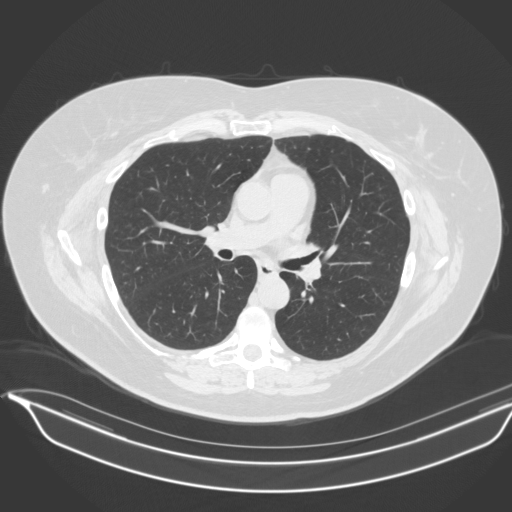
[im 99/157  lung]
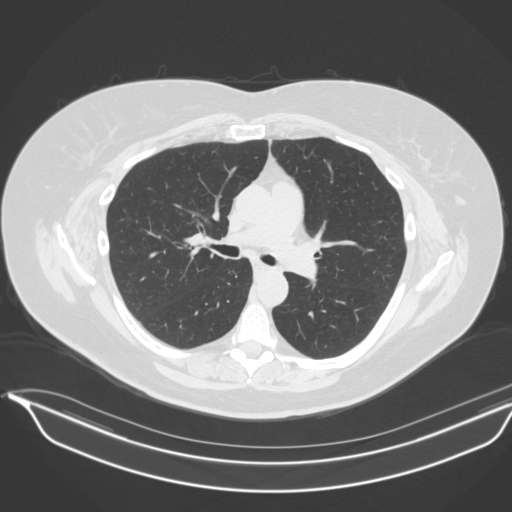
[im 116/157  lung]
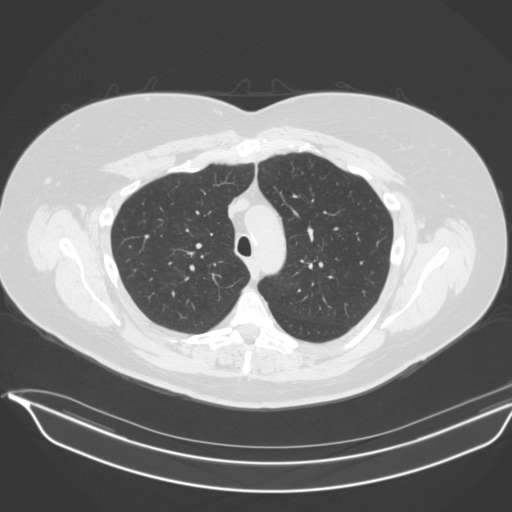
[im 125/157  lung]
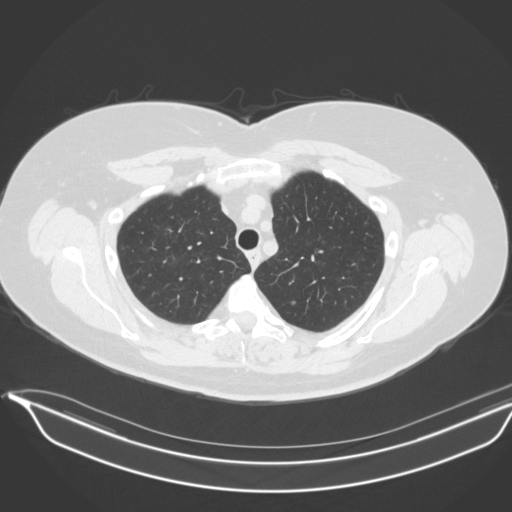
[im 133/157  mediastinal]
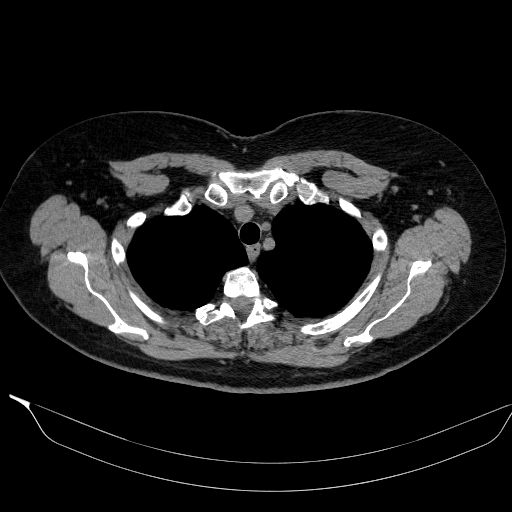
[im 133/157  lung]
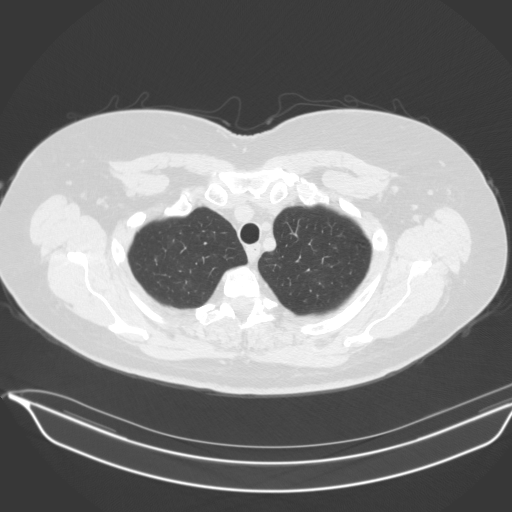
[im 145/157  lung]
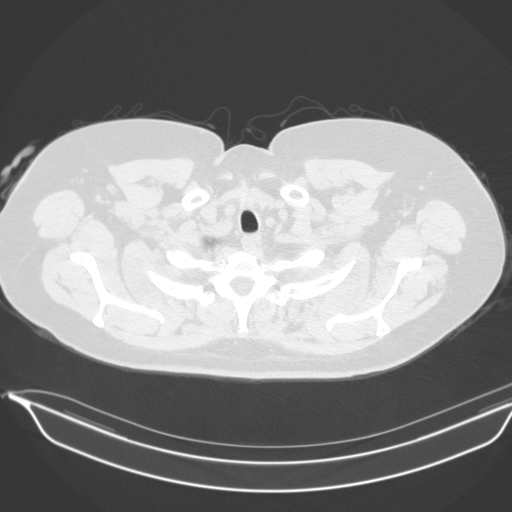

[14 of 34 positions shown; findings below may reference images not displayed]

FINDINGS: Cardiovascular: Normal heart size. No significant pericardial
effusion/thickening. Atherosclerotic nonaneurysmal thoracic aorta.
Normal caliber pulmonary arteries.

Mediastinum/Nodes: No discrete thyroid nodules. Unremarkable
esophagus. No pathologically enlarged axillary, mediastinal or hilar
lymph nodes, noting limited sensitivity for the detection of hilar
adenopathy on this noncontrast study.

Lungs/Pleura: No pneumothorax. No pleural effusion. Mild diffuse
bronchial wall thickening. Irregular 0.8 cm posterior right middle
lobe pulmonary nodule (series 3/image 84). No acute consolidative
airspace disease or lung masses. There is mild patchy upper lobe
predominant centrilobular ground-glass micronodularity in both
lungs.

Upper abdomen: Diffuse hepatic steatosis.

Musculoskeletal: No aggressive appearing focal osseous lesions. No
fractures. Moderate thoracic spondylosis.
IMPRESSION: 1. No acute traumatic injury in the chest.
2. Irregular 0.8 cm posterior right middle lobe pulmonary nodule.
Non-contrast chest CT at 6-12 months is recommended. If the nodule
is stable at time of repeat CT, then future CT at 18-24 months (from
today's scan) is considered optional for low-risk patients, but is
recommended for high-risk patients. This recommendation follows the
consensus statement: Guidelines for Management of Incidental
Pulmonary Nodules Detected on CT Images:From the [HOSPITAL]
1556; published online before print (10.1148/radiol.1143414184).
3. Mild patchy upper lobe predominant centrilobular ground-glass
micronodularity in both lungs. If the patient is a current smoker,
findings are most compatible with smoking related interstitial lung
disease (respiratory bronchiolitis-interstitial lung disease if the
patient has pulmonary symptoms versus respiratory bronchiolitis if
the patient does not have pulmonary symptoms). If the patient is not
a current smoker, findings are suggestive of subacute
hypersensitivity pneumonitis.
4. Diffuse hepatic steatosis.

Aortic Atherosclerosis ([id number]-AYD.D).

## 2019-05-18 DIAGNOSIS — Z23 Encounter for immunization: Secondary | ICD-10-CM | POA: Diagnosis not present

## 2019-05-20 ENCOUNTER — Ambulatory Visit
Admission: RE | Admit: 2019-05-20 | Discharge: 2019-05-20 | Disposition: A | Discharge status: Home / Self Care | Payer: Federal, State, Local not specified - PPO | Source: Ambulatory Visit | Attending: Family Medicine | Admitting: Family Medicine

## 2019-05-20 DIAGNOSIS — R911 Solitary pulmonary nodule: Secondary | ICD-10-CM | POA: Diagnosis not present

## 2019-05-20 DIAGNOSIS — R9389 Abnormal findings on diagnostic imaging of other specified body structures: Secondary | ICD-10-CM

## 2019-07-19 DIAGNOSIS — I700 Atherosclerosis of aorta: Secondary | ICD-10-CM | POA: Diagnosis not present

## 2019-07-19 DIAGNOSIS — I779 Disorder of arteries and arterioles, unspecified: Secondary | ICD-10-CM | POA: Diagnosis not present

## 2019-07-19 DIAGNOSIS — E782 Mixed hyperlipidemia: Secondary | ICD-10-CM | POA: Diagnosis not present

## 2019-07-19 DIAGNOSIS — I10 Essential (primary) hypertension: Secondary | ICD-10-CM | POA: Diagnosis not present

## 2019-09-23 DIAGNOSIS — M7661 Achilles tendinitis, right leg: Secondary | ICD-10-CM | POA: Diagnosis not present

## 2019-09-23 DIAGNOSIS — M25562 Pain in left knee: Secondary | ICD-10-CM | POA: Diagnosis not present

## 2019-10-13 DIAGNOSIS — Z13820 Encounter for screening for osteoporosis: Secondary | ICD-10-CM | POA: Diagnosis not present

## 2019-10-13 DIAGNOSIS — Z01419 Encounter for gynecological examination (general) (routine) without abnormal findings: Secondary | ICD-10-CM | POA: Diagnosis not present

## 2019-10-13 DIAGNOSIS — Z1231 Encounter for screening mammogram for malignant neoplasm of breast: Secondary | ICD-10-CM | POA: Diagnosis not present

## 2019-10-25 DIAGNOSIS — F410 Panic disorder [episodic paroxysmal anxiety] without agoraphobia: Secondary | ICD-10-CM | POA: Diagnosis not present

## 2019-10-25 DIAGNOSIS — F3342 Major depressive disorder, recurrent, in full remission: Secondary | ICD-10-CM | POA: Diagnosis not present

## 2020-01-12 DIAGNOSIS — Z0000 Encounter for general adult medical examination without abnormal findings: Secondary | ICD-10-CM | POA: Diagnosis not present

## 2020-01-12 DIAGNOSIS — E782 Mixed hyperlipidemia: Secondary | ICD-10-CM | POA: Diagnosis not present

## 2020-01-12 DIAGNOSIS — I10 Essential (primary) hypertension: Secondary | ICD-10-CM | POA: Diagnosis not present

## 2020-01-12 DIAGNOSIS — R000 Tachycardia, unspecified: Secondary | ICD-10-CM | POA: Diagnosis not present

## 2020-03-15 DIAGNOSIS — R7309 Other abnormal glucose: Secondary | ICD-10-CM | POA: Diagnosis not present

## 2020-03-27 DIAGNOSIS — E1169 Type 2 diabetes mellitus with other specified complication: Secondary | ICD-10-CM | POA: Diagnosis not present

## 2020-03-27 DIAGNOSIS — Z23 Encounter for immunization: Secondary | ICD-10-CM | POA: Diagnosis not present

## 2020-03-27 DIAGNOSIS — R109 Unspecified abdominal pain: Secondary | ICD-10-CM | POA: Diagnosis not present

## 2020-03-27 DIAGNOSIS — R202 Paresthesia of skin: Secondary | ICD-10-CM | POA: Diagnosis not present

## 2020-04-17 DIAGNOSIS — F410 Panic disorder [episodic paroxysmal anxiety] without agoraphobia: Secondary | ICD-10-CM | POA: Diagnosis not present

## 2020-04-17 DIAGNOSIS — F3342 Major depressive disorder, recurrent, in full remission: Secondary | ICD-10-CM | POA: Diagnosis not present

## 2020-06-15 ENCOUNTER — Encounter: Payer: Self-pay | Admitting: Skilled Nursing Facility1

## 2020-06-15 ENCOUNTER — Encounter: Payer: Federal, State, Local not specified - PPO | Attending: Family Medicine | Admitting: Skilled Nursing Facility1

## 2020-06-15 ENCOUNTER — Other Ambulatory Visit: Payer: Self-pay

## 2020-06-15 DIAGNOSIS — E119 Type 2 diabetes mellitus without complications: Secondary | ICD-10-CM | POA: Insufficient documentation

## 2020-06-15 NOTE — Progress Notes (Signed)
Pt came with her seemingly supportive husband. Pt states her husband does the shopping part. Pt states she is stress eater. Pt states she is not hungry during the day and tend to overeat later during night.  Pt states she is willing to test blood glucose if its prescribed and taught how to use. Pt wanted to know about what food she can eat and what not. Pt collecting information from friends and Internet.   Dietitian educated on physiology of diabetes and long term complication. Dietitian educated pt on risk factors for diabetes like genetics, aging, stress. Dietitian suggested to check blood glucose regularly.  Dietitian educated pt on benefits of physical activity. Dietitian educated pt on detailed my plate. Pt states that she is more willing to drink ensure for breakfast. Dietitian advised glucerna instead.  Pt states she has depression and hyperness which usually last for 4 days.  Pt states she is not aware about the connection between nutrition and mental health. Pt states she doesn't like to take bunch of medication pills. Pt is not taking  Medication for depression stating she was previously but states she took herself off due to not wanting to take multiple medication. Dietitian recommended pt to work with a Financial trader. Pt is not necessarily open to this at this time.  Goals: 10 mins of activity daily Eating within an hour of waking up and then every 3-5 hr after that. In breakfast you can start with yogurt and nuts  Snacks: humus and carrots   For lunch and Dinner: follow detailed my plate guide   Diabetes Self-Management Education  Visit Type: First/Initial  Appt. Start Time: 2 pm Appt. End Time: 3:09pm  06/15/2020  [name], identified by name and date of birth, is a 64 y.o. female with a diagnosis of Diabetes: Type 2.   ASSESSMENT  There were no vitals taken for this visit. There is no height or weight on file to calculate BMI.   Diabetes  Self-Management Education - 06/15/20 1409      Visit Information   Visit Type First/Initial      Initial Visit   Diabetes Type Type 2    Are you currently following a meal plan? No    Are you taking your medications as prescribed? Not on Medications      Health Coping   How would you rate your overall health? Good      Psychosocial Assessment   Patient Belief/Attitude about Diabetes Motivated to manage diabetes    Self-care barriers None    Self-management support Family;Friends    Other persons present Spouse/SO    Patient Concerns Nutrition/Meal planning    Special Needs None    Learning Readiness Contemplating    How often do you need to have someone help you when you read instructions, pamphlets, or other written materials from your doctor or pharmacy? 1 - Never    What is the last grade level you completed in school? 13      Pre-Education Assessment   Patient understands the diabetes disease and treatment process. Needs Instruction    Patient understands incorporating nutritional management into lifestyle. Needs Instruction    Patient undertands incorporating physical activity into lifestyle. Needs Instruction    Patient understands using medications safely. Needs Instruction    Patient understands monitoring blood glucose, interpreting and using results Needs Instruction    Patient understands prevention, detection, and treatment of acute complications. Needs Instruction    Patient understands prevention, detection, and  treatment of chronic complications. Needs Instruction    Patient understands how to develop strategies to address psychosocial issues. Needs Instruction    Patient understands how to develop strategies to promote health/change behavior. Needs Instruction      Complications   Last HgB A1C per patient/outside source 6.7 %    How often do you check your blood sugar? 0 times/day (not testing)    Have you had a dilated eye exam in the past 12 months? No    Have  you had a dental exam in the past 12 months? No    Are you checking your feet? No      Exercise   Exercise Type ADL's      Patient Education   Previous Diabetes Education No    Disease state  Definition of diabetes, type 1 and 2, and the diagnosis of diabetes;Factors that contribute to the development of diabetes    Acute complications Taught treatment of hypoglycemia - the 15 rule.;Discussed and identified patients' treatment of hyperglycemia.;Covered sick day management with medication and food.      Individualized Goals (developed by patient)   Nutrition Follow meal plan discussed    Physical Activity Exercise 5-7 days per week;15 minutes per day    Medications Not Applicable    Monitoring  test my blood glucose as discussed      Post-Education Assessment   Patient understands the diabetes disease and treatment process. Demonstrates understanding / competency    Patient understands incorporating nutritional management into lifestyle. Demonstrates understanding / competency    Patient undertands incorporating physical activity into lifestyle. Demonstrates understanding / competency    Patient understands using medications safely. Demonstrates understanding / competency    Patient understands monitoring blood glucose, interpreting and using results Demonstrates understanding / competency    Patient understands prevention, detection, and treatment of acute complications. Demonstrates understanding / competency    Patient understands prevention, detection, and treatment of chronic complications. Demonstrates understanding / competency    Patient understands how to develop strategies to address psychosocial issues. Demonstrates understanding / competency    Patient understands how to develop strategies to promote health/change behavior. Demonstrates understanding / competency      Outcomes   Expected Outcomes Demonstrated interest in learning. Expect positive outcomes    Future DMSE 4-6  wks    Program Status Completed           Individualized Plan for Diabetes Self-Management Training:   Learning Objective:  Patient will have a greater understanding of diabetes self-management. Patient education plan is to attend individual and/or group sessions per assessed needs and concerns.   Plan:   There are no Patient Instructions on file for this visit.  Expected Outcomes:  Demonstrated interest in learning. Expect positive outcomes  Education material provided: ADA - How to Thrive: A Guide for Your Journey with Diabetes  If problems or questions, patient to contact team via:  Phone  Future DSME appointment: 4-6 wks

## 2020-07-03 DIAGNOSIS — I10 Essential (primary) hypertension: Secondary | ICD-10-CM | POA: Diagnosis not present

## 2020-07-03 DIAGNOSIS — H5203 Hypermetropia, bilateral: Secondary | ICD-10-CM | POA: Diagnosis not present

## 2020-07-03 DIAGNOSIS — E119 Type 2 diabetes mellitus without complications: Secondary | ICD-10-CM | POA: Diagnosis not present

## 2020-07-03 DIAGNOSIS — H35033 Hypertensive retinopathy, bilateral: Secondary | ICD-10-CM | POA: Diagnosis not present

## 2020-07-06 ENCOUNTER — Encounter: Payer: Federal, State, Local not specified - PPO | Attending: Family Medicine | Admitting: Skilled Nursing Facility1

## 2020-07-06 ENCOUNTER — Other Ambulatory Visit: Payer: Self-pay

## 2020-07-06 DIAGNOSIS — E119 Type 2 diabetes mellitus without complications: Secondary | ICD-10-CM | POA: Diagnosis not present

## 2020-07-06 NOTE — Progress Notes (Signed)
Pt came with her seemingly supportive husband. Pt states her husband does the shopping part. Pt states she is stress eater. Pt states she is not hungry during the day and tend to overeat later during night.  Pt states she is willing to test blood glucose if its prescribed and taught how to use. Pt wanted to know about what food she can eat and what not. Pt collecting information from friends and Internet.   Dietitian educated on physiology of diabetes and long term complication. Dietitian educated pt on risk factors for diabetes like genetics, aging, stress. Dietitian suggested to check blood glucose regularly.  Dietitian educated pt on benefits of physical activity. Dietitian educated pt on detailed my plate. Pt states that she is more willing to drink ensure for breakfast. Dietitian advised glucerna instead.  Pt states she has depression and hyperness which usually last for 4 days.  Pt states she is not aware about the connection between nutrition and mental health. Pt states she doesn't like to take bunch of medication pills. Pt is not taking  Medication for depression stating she was previously but states she took herself off due to not wanting to take multiple medication. Dietitian recommended pt to work with a Financial trader. Pt is not necessarily open to this at this time.  Pt states she wonders if she has to eat with balance every time? Dietitiana dvseid pt there is benefit to having the different food groups on the same plate due to suagr release and different nutrients being unlocked when eaten with other nutrients.  Pt states she typically does not eat throughout the day. Pt states she is thinking she will start walking some paths near her house. Pt states she checked her blood sugar once and it was 94.  Dietitian advised pt to focus on activity and eating at least 3 meals to ensure she is reaching her nutrient needs. Dietitian educated pt on her age group needing appropriate  nutrients to mitigate risks of morbidity and mortality.   24 hr recall: First meal: equate meal replacement Snack: cuties  Second meal: sloppy joe-cheese + chips  Snack: ginger snaps Third meal: skipped Snack:  Beverages: water, tea unsweet   Goals: 30 mins of activity daily Eating within an hour of waking up and then every 3-5 hr after that. In breakfast you can start with yogurt and nuts  Snacks: humus and carrots   For lunch and Dinner: follow detailed my plate guide Choose equate supplement for those diabetes to ensure it has an appropriate amount of carbohydrates

## 2020-09-25 DIAGNOSIS — M549 Dorsalgia, unspecified: Secondary | ICD-10-CM | POA: Diagnosis not present

## 2020-09-25 DIAGNOSIS — E1169 Type 2 diabetes mellitus with other specified complication: Secondary | ICD-10-CM | POA: Diagnosis not present

## 2020-09-25 DIAGNOSIS — R9389 Abnormal findings on diagnostic imaging of other specified body structures: Secondary | ICD-10-CM | POA: Diagnosis not present

## 2020-09-27 ENCOUNTER — Other Ambulatory Visit: Payer: Self-pay | Admitting: Family Medicine

## 2020-09-27 DIAGNOSIS — R9389 Abnormal findings on diagnostic imaging of other specified body structures: Secondary | ICD-10-CM

## 2020-10-06 DIAGNOSIS — F4321 Adjustment disorder with depressed mood: Secondary | ICD-10-CM | POA: Diagnosis not present

## 2020-10-06 DIAGNOSIS — F410 Panic disorder [episodic paroxysmal anxiety] without agoraphobia: Secondary | ICD-10-CM | POA: Diagnosis not present

## 2020-10-07 ENCOUNTER — Ambulatory Visit
Admission: RE | Admit: 2020-10-07 | Discharge: 2020-10-07 | Disposition: A | Discharge status: Home / Self Care | Payer: Federal, State, Local not specified - PPO | Source: Ambulatory Visit | Attending: Family Medicine | Admitting: Family Medicine

## 2020-10-07 DIAGNOSIS — R9389 Abnormal findings on diagnostic imaging of other specified body structures: Secondary | ICD-10-CM

## 2020-10-07 DIAGNOSIS — J984 Other disorders of lung: Secondary | ICD-10-CM | POA: Diagnosis not present

## 2020-10-07 DIAGNOSIS — R911 Solitary pulmonary nodule: Secondary | ICD-10-CM | POA: Diagnosis not present

## 2020-10-07 DIAGNOSIS — K449 Diaphragmatic hernia without obstruction or gangrene: Secondary | ICD-10-CM | POA: Diagnosis not present

## 2020-10-07 DIAGNOSIS — I700 Atherosclerosis of aorta: Secondary | ICD-10-CM | POA: Diagnosis not present

## 2020-10-10 ENCOUNTER — Other Ambulatory Visit: Payer: Self-pay | Admitting: Family Medicine

## 2020-10-10 ENCOUNTER — Other Ambulatory Visit (HOSPITAL_COMMUNITY): Payer: Self-pay | Admitting: Family Medicine

## 2020-10-10 DIAGNOSIS — R9389 Abnormal findings on diagnostic imaging of other specified body structures: Secondary | ICD-10-CM

## 2020-10-25 ENCOUNTER — Other Ambulatory Visit: Payer: Self-pay

## 2020-10-25 ENCOUNTER — Ambulatory Visit (HOSPITAL_COMMUNITY)
Admission: RE | Admit: 2020-10-25 | Discharge: 2020-10-25 | Disposition: A | Discharge status: Home / Self Care | Payer: Federal, State, Local not specified - PPO | Source: Ambulatory Visit | Attending: Family Medicine | Admitting: Family Medicine

## 2020-10-25 DIAGNOSIS — R911 Solitary pulmonary nodule: Secondary | ICD-10-CM | POA: Diagnosis not present

## 2020-10-25 DIAGNOSIS — R9389 Abnormal findings on diagnostic imaging of other specified body structures: Secondary | ICD-10-CM | POA: Diagnosis not present

## 2020-10-25 LAB — GLUCOSE, CAPILLARY: Glucose-Capillary: 197 mg/dL — ABNORMAL HIGH (ref 70–99)

## 2020-10-25 MED ORDER — FLUDEOXYGLUCOSE F - 18 (FDG) INJECTION
9.8000 | Freq: Once | INTRAVENOUS | Status: AC
  Administered 2020-10-25: 9.83 via INTRAVENOUS

## 2020-11-06 DIAGNOSIS — Z1231 Encounter for screening mammogram for malignant neoplasm of breast: Secondary | ICD-10-CM | POA: Diagnosis not present

## 2020-11-06 DIAGNOSIS — Z7689 Persons encountering health services in other specified circumstances: Secondary | ICD-10-CM | POA: Diagnosis not present

## 2020-11-06 DIAGNOSIS — Z01419 Encounter for gynecological examination (general) (routine) without abnormal findings: Secondary | ICD-10-CM | POA: Diagnosis not present

## 2021-03-06 DIAGNOSIS — J45909 Unspecified asthma, uncomplicated: Secondary | ICD-10-CM | POA: Diagnosis not present

## 2021-03-06 DIAGNOSIS — I10 Essential (primary) hypertension: Secondary | ICD-10-CM | POA: Diagnosis not present

## 2021-03-06 DIAGNOSIS — E1169 Type 2 diabetes mellitus with other specified complication: Secondary | ICD-10-CM | POA: Diagnosis not present

## 2021-03-06 DIAGNOSIS — E782 Mixed hyperlipidemia: Secondary | ICD-10-CM | POA: Diagnosis not present

## 2021-03-06 DIAGNOSIS — Z0000 Encounter for general adult medical examination without abnormal findings: Secondary | ICD-10-CM | POA: Diagnosis not present

## 2021-03-28 DIAGNOSIS — F411 Generalized anxiety disorder: Secondary | ICD-10-CM | POA: Diagnosis not present

## 2021-09-07 ENCOUNTER — Other Ambulatory Visit: Payer: Self-pay | Admitting: Family Medicine

## 2021-09-07 DIAGNOSIS — E1169 Type 2 diabetes mellitus with other specified complication: Secondary | ICD-10-CM | POA: Diagnosis not present

## 2021-09-07 DIAGNOSIS — I10 Essential (primary) hypertension: Secondary | ICD-10-CM | POA: Diagnosis not present

## 2021-09-07 DIAGNOSIS — R9389 Abnormal findings on diagnostic imaging of other specified body structures: Secondary | ICD-10-CM | POA: Diagnosis not present

## 2021-09-24 DIAGNOSIS — F3342 Major depressive disorder, recurrent, in full remission: Secondary | ICD-10-CM | POA: Diagnosis not present

## 2021-09-24 DIAGNOSIS — F410 Panic disorder [episodic paroxysmal anxiety] without agoraphobia: Secondary | ICD-10-CM | POA: Diagnosis not present

## 2021-09-24 DIAGNOSIS — F4321 Adjustment disorder with depressed mood: Secondary | ICD-10-CM | POA: Diagnosis not present

## 2021-10-05 ENCOUNTER — Ambulatory Visit
Admission: RE | Admit: 2021-10-05 | Discharge: 2021-10-05 | Disposition: A | Discharge status: Home / Self Care | Payer: Federal, State, Local not specified - PPO | Source: Ambulatory Visit | Attending: Family Medicine | Admitting: Family Medicine

## 2021-10-05 DIAGNOSIS — R9389 Abnormal findings on diagnostic imaging of other specified body structures: Secondary | ICD-10-CM

## 2021-10-05 DIAGNOSIS — I700 Atherosclerosis of aorta: Secondary | ICD-10-CM | POA: Diagnosis not present

## 2021-10-05 DIAGNOSIS — R911 Solitary pulmonary nodule: Secondary | ICD-10-CM | POA: Diagnosis not present

## 2022-02-25 DIAGNOSIS — R000 Tachycardia, unspecified: Secondary | ICD-10-CM | POA: Diagnosis not present

## 2022-02-25 DIAGNOSIS — R079 Chest pain, unspecified: Secondary | ICD-10-CM | POA: Diagnosis not present

## 2022-02-25 DIAGNOSIS — E119 Type 2 diabetes mellitus without complications: Secondary | ICD-10-CM | POA: Diagnosis not present

## 2022-02-28 ENCOUNTER — Emergency Department (HOSPITAL_COMMUNITY)
Admission: EM | Admit: 2022-02-28 | Discharge: 2022-03-01 | Disposition: A | Discharge status: Home / Self Care | Payer: Federal, State, Local not specified - PPO | Attending: Emergency Medicine | Admitting: Emergency Medicine

## 2022-02-28 ENCOUNTER — Other Ambulatory Visit: Payer: Self-pay

## 2022-02-28 ENCOUNTER — Emergency Department (HOSPITAL_COMMUNITY): Payer: Federal, State, Local not specified - PPO

## 2022-02-28 DIAGNOSIS — D72829 Elevated white blood cell count, unspecified: Secondary | ICD-10-CM | POA: Diagnosis not present

## 2022-02-28 DIAGNOSIS — I4891 Unspecified atrial fibrillation: Secondary | ICD-10-CM | POA: Insufficient documentation

## 2022-02-28 DIAGNOSIS — R002 Palpitations: Secondary | ICD-10-CM | POA: Diagnosis not present

## 2022-02-28 DIAGNOSIS — I499 Cardiac arrhythmia, unspecified: Secondary | ICD-10-CM | POA: Diagnosis not present

## 2022-02-28 DIAGNOSIS — R079 Chest pain, unspecified: Secondary | ICD-10-CM | POA: Diagnosis not present

## 2022-02-28 DIAGNOSIS — I10 Essential (primary) hypertension: Secondary | ICD-10-CM | POA: Diagnosis not present

## 2022-02-28 LAB — COMPREHENSIVE METABOLIC PANEL
ALT: 19 U/L (ref 0–44)
AST: 23 U/L (ref 15–41)
Albumin: 3.8 g/dL (ref 3.5–5.0)
Alkaline Phosphatase: 51 U/L (ref 38–126)
Anion gap: 12 (ref 5–15)
BUN: 20 mg/dL (ref 8–23)
CO2: 26 mmol/L (ref 22–32)
Calcium: 9.3 mg/dL (ref 8.9–10.3)
Chloride: 101 mmol/L (ref 98–111)
Creatinine, Ser: 0.93 mg/dL (ref 0.44–1.00)
GFR, Estimated: >60 mL/min (ref >60)
Glucose, Bld: 197 mg/dL — ABNORMAL HIGH (ref 70–99)
Potassium: 3.2 mmol/L — ABNORMAL LOW (ref 3.5–5.1)
Sodium: 139 mmol/L (ref 135–145)
Total Bilirubin: 0.5 mg/dL (ref 0.3–1.2)
Total Protein: 5.8 g/dL — ABNORMAL LOW (ref 6.5–8.1)

## 2022-02-28 LAB — CBC WITH DIFFERENTIAL/PLATELET
Abs Immature Granulocytes: 0.03 10*3/uL (ref 0.00–0.07)
Basophils Absolute: 0.1 10*3/uL (ref 0.0–0.1)
Basophils Relative: 1 %
Eosinophils Absolute: 0.3 10*3/uL (ref 0.0–0.5)
Eosinophils Relative: 3 %
HCT: 49.3 % — ABNORMAL HIGH (ref 36.0–46.0)
Hemoglobin: 17.2 g/dL — ABNORMAL HIGH (ref 12.0–15.0)
Immature Granulocytes: 0 %
Lymphocytes Relative: 42 %
Lymphs Abs: 4.7 10*3/uL — ABNORMAL HIGH (ref 0.7–4.0)
MCH: 31 pg (ref 26.0–34.0)
MCHC: 34.9 g/dL (ref 30.0–36.0)
MCV: 88.8 fL (ref 80.0–100.0)
Monocytes Absolute: 0.9 10*3/uL (ref 0.1–1.0)
Monocytes Relative: 8 %
Neutro Abs: 5.3 10*3/uL (ref 1.7–7.7)
Neutrophils Relative %: 46 %
Platelets: 160 10*3/uL (ref 150–400)
RBC: 5.55 MIL/uL — ABNORMAL HIGH (ref 3.87–5.11)
RDW: 13.9 % (ref 11.5–15.5)
WBC: 11.4 10*3/uL — ABNORMAL HIGH (ref 4.0–10.5)
nRBC: 0 % (ref 0.0–0.2)

## 2022-02-28 LAB — PROTIME-INR
INR: 1 (ref 0.8–1.2)
Prothrombin Time: 13 seconds (ref 11.4–15.2)

## 2022-02-28 LAB — TSH: TSH: 3.532 u[IU]/mL (ref 0.350–4.500)

## 2022-02-28 LAB — MAGNESIUM: Magnesium: 1.9 mg/dL (ref 1.7–2.4)

## 2022-02-28 MED ORDER — POTASSIUM CHLORIDE CRYS ER 20 MEQ PO TBCR: 20.0000 meq | EXTENDED_RELEASE_TABLET | Freq: Once | ORAL | Status: DC

## 2022-02-28 MED ORDER — LACTATED RINGERS IV BOLUS
500.0000 mL | Freq: Once | INTRAVENOUS | Status: AC
  Administered 2022-02-28: 500 mL via INTRAVENOUS

## 2022-02-28 NOTE — ED Triage Notes (Signed)
Pt here from  home via [facility] for new onset atrial fibrillation.Pt states she has noticed palpitations and HR fluctuations over the last week (ish). Pt saw pcp last week, but was not in afib. EMS found pt to be in afib w rate 140-150. EMS gave approx fluids - pt converted to NSR at rate of 90. GCS 15, other vitals stable. 20g LFA.

## 2022-02-28 NOTE — ED Provider Notes (Signed)
[facility] EMERGENCY DEPARTMENT Provider Note   CSN: [id number] Arrival date & time: 02/28/22  2111     History  Chief Complaint  Patient presents with   Atrial Fibrillation    [name] is a 66 y.o. female past medical history PSVT who is presenting with palpitations.  Patient brought in by EMS, who contribute to the history.  They report that the patient was experiencing "palpitations", and when they arrived on scene, they found her to be in A-fib with RVR, with rates in the 140s to 150s.  Patient also reported some chest pain that began in the center of her chest, and radiated to the left arm.  This has since improved.  Patient says that she has had these episodes of palpitations on and off for the last week, and saw her primary care doctor for them earlier this week, where she was subsequently referred to cardiology.  She was unable to get an appointment until September.  She denies any dizziness, lightheadedness, but did note some diaphoresis.  She denies any difficulty breathing, abdominal pain, nausea, vomiting, diarrhea.  She denies any cough, congestion, sore throat, fever, dysuria, hematuria, rash.  The history is provided by the patient and the EMS personnel.  Atrial Fibrillation    Home Medications Prior to Admission medications   Not on File      Allergies    Penicillins and Wellbutrin [bupropion]    Review of Systems   Review of Systems As in HPI.  Physical Exam Updated Vital Signs BP (!) 157/82   Pulse 86   Temp 98.6 F (37 C) (Oral)   Resp 17   LMP 10/26/1998 (Within Years) Comment: ~IN 40s  SpO2 96%  Physical Exam Vitals and nursing note reviewed.  Constitutional:      General: She is not in acute distress.    Appearance: Normal appearance. She is well-developed. She is not ill-appearing or diaphoretic.  HENT:     Head: Normocephalic and atraumatic.     Right Ear: External ear normal.     Left Ear: External ear normal.     Nose:  Nose normal.     Mouth/Throat:     Mouth: Mucous membranes are moist.  Cardiovascular:     Rate and Rhythm: Normal rate. Rhythm irregular.     Pulses: Normal pulses.     Heart sounds: No murmur heard. Pulmonary:     Effort: Pulmonary effort is normal. No respiratory distress.     Breath sounds: Normal breath sounds. No wheezing.  Abdominal:     General: There is no distension.     Palpations: Abdomen is soft.     Tenderness: There is no abdominal tenderness. There is no guarding.  Musculoskeletal:     Cervical back: Neck supple.     Right lower leg: No edema.     Left lower leg: No edema.  Skin:    General: Skin is warm and dry.  Neurological:     Mental Status: She is alert.     ED Results / Procedures / Treatments   Labs (all labs ordered are listed, but only abnormal results are displayed) Labs Reviewed - No data to display  EKG EKG Interpretation  Date/Time:  Thursday February 28 2022 21:16:36 EDT Ventricular Rate:  93 PR Interval:  174 QRS Duration: 99 QT Interval:  319 QTC Calculation: 397 R Axis:   70 Text Interpretation: Sinus tachycardia Paired ventricular premature complexes Nonspecific T abnormalities, diffuse leads Baseline wander  in lead(s) II III aVF Confirmed by Pricilla Loveless [phone]) on 02/28/2022 9:37:34 PM  Radiology DG Chest Portable 1 View  Result Date: 02/28/2022 CLINICAL DATA:  Encounter for AFib EXAM: PORTABLE CHEST 1 VIEW COMPARISON:  CT chest 10/05/2021 FINDINGS: No focal consolidation, pleural effusion, or pneumothorax. The right upper lobe nodule on chest CT 10/05/2021 is not visualized radiographically. Normal cardiomediastinal silhouette. No acute osseous abnormality. IMPRESSION: No active disease. Electronically Signed   By: Minerva Fester M.D.   On: 02/28/2022 22:30    Procedures Procedures    Medications Ordered in ED Medications - No data to display  ED Course/ Medical Decision Making/ A&P                           Medical  Decision Making Amount and/or Complexity of Data Reviewed Labs: ordered. Radiology: ordered.  Risk Prescription drug management.   [name] is a 66 y.o. female with significant PMHx PSVT who presented to the ED with palpitations, found to be in A fib w RVR by EMS, and spontaneously converted back to sinus rhythm prior to arrival.  Vitals at presentation within normal limits.  Patient is hemodynamically stable, afebrile, satting well on room air.  Physical exam reassuring.  Normal heart sounds.  Lungs clear to auscultation bilaterally.  Abdomen is soft, nontender to palpation.  No pitting edema.  Patient placed on cardiac monitor, which demonstrates sinus rhythm with frequent PVCs.  EKG obtained, with similar results.  No evidence of acute ischemia.  EKG independently interpreted by myself and my attending.  Lab work ordered, with results pending at the time of signout.  Initial lab work back, CBC with leukocytosis 11.4, and hemoglobin elevated at 17.2.  Imaging was pursued, independently interpreted by me.  Chest x-ray reveals no evidence of significant pleural effusion, pneumothorax, cardiomegaly.  CHA2DS2-VASc score 3.  Patient will likely require anticoagulation upon discharge from the ED and additional outpatient follow-up with primary care doctor as well as cardiology.  The rest of patient's labs are pending.  Patient will remain on cardiac monitor to continue to monitor for potential return to A-fib with RVR.  Ultimate disposition will depend on the results of the studies.  The plan for this patient was discussed with Dr. Criss Alvine, who voiced agreement and who oversaw evaluation and treatment of this patient.    Final Clinical Impression(s) / ED Diagnoses Final diagnoses:  Palpitations    Rx / DC Orders ED Discharge Orders     None         Skeet Simmer, MD 03/01/22 2426    Pricilla Loveless, MD 03/04/22 [phone]

## 2022-03-01 LAB — TROPONIN I (HIGH SENSITIVITY)
Troponin I (High Sensitivity): 8 ng/L (ref <18)
Troponin I (High Sensitivity): 8 ng/L (ref <18)

## 2022-03-01 MED ORDER — POTASSIUM CHLORIDE CRYS ER 20 MEQ PO TBCR: 20.0000 meq | EXTENDED_RELEASE_TABLET | Freq: Two times a day (BID) | ORAL | 0 refills | Status: DC

## 2022-03-01 MED ORDER — APIXABAN 5 MG PO TABS
5.0000 mg | ORAL_TABLET | Freq: Once | ORAL | Status: AC
  Administered 2022-03-01: 5 mg via ORAL
  Filled 2022-03-01: qty 1

## 2022-03-01 MED ORDER — APIXABAN 5 MG PO TABS: 5.0000 mg | ORAL_TABLET | Freq: Two times a day (BID) | ORAL | 0 refills | Status: DC

## 2022-03-01 NOTE — ED Provider Notes (Signed)
I assumed care at signout to follow-up on labs.  Repeat troponin negative.  I reviewed EMS notes, she did have a rhythm strip to reveal heart rate in the 140s in atrial fibrillation She is now back in sinus rhythm She is due to be placed on Eliquis and referral to cardiology Patient has no new complaints this time.  Also advised to take her potassium at home   Zadie Rhine, MD 03/01/22 [phone]

## 2022-03-01 NOTE — Discharge Instructions (Signed)
You were seen today for palpitations.  Your blood work and imaging studies showed you have low potassium.  You should start taking a multivitamin for this.  Please follow-up with cardiology regarding your palpitations. You have been prescribed eliquis. You should take this medication by mouth twice daily until you see your cardiologist. Please follow-up with your primary care doctor in the next 2 to 3 days regarding today's visit and your symptoms. Please return to the emergency department if you experience any chest pain, difficulty breathing, abdominal pain, altered mental status, severe fever, or any other concerning symptom.  Thank you for allowing [place] to participate in your care.

## 2022-03-12 ENCOUNTER — Ambulatory Visit (HOSPITAL_COMMUNITY)
Admission: RE | Admit: 2022-03-12 | Discharge: 2022-03-12 | Disposition: A | Discharge status: Home / Self Care | Payer: Federal, State, Local not specified - PPO | Source: Ambulatory Visit | Attending: Nurse Practitioner | Admitting: Nurse Practitioner

## 2022-03-12 ENCOUNTER — Encounter (HOSPITAL_COMMUNITY): Payer: Self-pay | Admitting: Nurse Practitioner

## 2022-03-12 ENCOUNTER — Other Ambulatory Visit (HOSPITAL_COMMUNITY): Payer: Self-pay

## 2022-03-12 VITALS — BP 154/76 | HR 68 | Ht 63.0 in | Wt 174.8 lb

## 2022-03-12 DIAGNOSIS — I4891 Unspecified atrial fibrillation: Secondary | ICD-10-CM

## 2022-03-12 DIAGNOSIS — Z7901 Long term (current) use of anticoagulants: Secondary | ICD-10-CM | POA: Diagnosis not present

## 2022-03-12 DIAGNOSIS — F17210 Nicotine dependence, cigarettes, uncomplicated: Secondary | ICD-10-CM | POA: Diagnosis not present

## 2022-03-12 DIAGNOSIS — R0681 Apnea, not elsewhere classified: Secondary | ICD-10-CM | POA: Diagnosis not present

## 2022-03-12 DIAGNOSIS — R0683 Snoring: Secondary | ICD-10-CM | POA: Diagnosis not present

## 2022-03-12 DIAGNOSIS — I10 Essential (primary) hypertension: Secondary | ICD-10-CM | POA: Diagnosis not present

## 2022-03-12 DIAGNOSIS — D6869 Other thrombophilia: Secondary | ICD-10-CM | POA: Diagnosis not present

## 2022-03-12 MED ORDER — APIXABAN 5 MG PO TABS: 5.0000 mg | ORAL_TABLET | Freq: Two times a day (BID) | ORAL | 3 refills | Status: DC

## 2022-03-12 NOTE — Progress Notes (Signed)
Primary Care Physician: Daisy Floro, MD Referring Physician: ED f/u    [name] is a 66 y.o. female with a h/o  PSVT who is presenting with palpitations.  Patient brought in by EMS, who contribute to the history.  They report that the patient was experiencing "palpitations", and when they arrived on scene, they found her to be in A-fib with RVR, with rates in the 140s to 150s.  Patient also reported some chest pain that began in the center of her chest, and radiated to the left arm.  This has since improved.  Patient says that she has had these episodes of palpitations on and off for the last week, and saw her primary care doctor for them earlier this week, where she was subsequently referred to cardiology.  When placed on the monitor in the ED, she was  in SR. No evidence of acute ischemia. She has a CHA2DS2VASc  score of 3. She was placed on  anticoagulation, eliquis 5 mg bid.   In the afib clinic today, she has not noted any further afib. She is taking eliquis 5 mg bid. She was already on metoprolol daily. She does not drink alcohol but does smoke around one pack a day, minimal caffeine. She has been witnessed to snore and have periods of apnea. She did have low K+ of 3.3 in the ED and was given 3 days of K+ replacement. She does see her PCP tomorrow and plans to have her K+ rechecked.   Today, she denies symptoms of palpitations, chest pain, shortness of breath, orthopnea, PND, lower extremity edema, dizziness, presyncope, syncope, or neurologic sequela. The patient is tolerating medications without difficulties and is otherwise without complaint today.   Past Medical History:  Diagnosis Date   Diabetes mellitus without complication (HCC)    Hyperlipidemia    Hypertension    Past Surgical History:  Procedure Laterality Date   APPENDECTOMY     CESAREAN SECTION      Current Outpatient Medications  Medication Sig Dispense Refill   apixaban (ELIQUIS) 5 MG TABS tablet Take 1  tablet (5 mg total) by mouth 2 (two) times daily. 60 tablet 0   chlorthalidone (HYGROTON) 25 MG tablet 1 tablet in the morning     metaxalone (SKELAXIN) 800 MG tablet Take 800 mg by mouth 3 (three) times daily as needed.     metoprolol succinate (TOPROL-XL) 50 MG 24 hr tablet 1 TABLET ORALLY ONCE A DAY 90 DAYS     Potassium 99 MG TABS Take 2 tablets by mouth in the morning.     pravastatin (PRAVACHOL) 40 MG tablet Take 1 tablet by mouth daily.     propranolol (INDERAL) 10 MG tablet Taking 1-2 tablets by mouth as needed for A-fib     traZODone (DESYREL) 50 MG tablet Take 50-150 mg by mouth at bedtime.     Coenzyme Q10 (COQ10) 50 MG CAPS See admin instructions.     JARDIANCE 10 MG TABS tablet Take 10 mg by mouth daily.     potassium chloride SA (KLOR-CON M) 20 MEQ tablet Take 1 tablet (20 mEq total) by mouth 2 (two) times daily for 4 days. (Patient not taking: Reported on 03/12/2022) 8 tablet 0   No current facility-administered medications for this encounter.    Allergies  Allergen Reactions   Diltiazem Hcl Hives    Not sure if she has a problem with this medication.   Penicillins    Simvastatin Other (See Comments)  myalgia   Wellbutrin [Bupropion] Hives    Social History   Socioeconomic History   Marital status: Married    Spouse name: Not on file   Number of children: Not on file   Years of education: Not on file   Highest education level: Not on file  Occupational History   Not on file  Tobacco Use   Smoking status: Not on file   Smokeless tobacco: Not on file  Substance and Sexual Activity   Alcohol use: Not on file   Drug use: Not on file   Sexual activity: Not on file  Other Topics Concern   Not on file  Social History Narrative   Not on file   Social Determinants of Health   Financial Resource Strain: Not on file  Food Insecurity: Not on file  Transportation Needs: Not on file  Physical Activity: Not on file  Stress: Not on file  Social Connections: Not  on file  Intimate Partner Violence: Not on file    No family history on file.  ROS- All systems are reviewed and negative except as per the HPI above  Physical Exam: Vitals:   03/12/22 0952  Weight: 79.3 kg  Height: 5\' 3"  (1.6 m)   Wt Readings from Last 3 Encounters:  03/12/22 79.3 kg  07/06/20 89.2 kg    Labs: Lab Results  Component Value Date   NA 139 02/28/2022   K 3.2 (L) 02/28/2022   CL 101 02/28/2022   CO2 26 02/28/2022   GLUCOSE 197 (H) 02/28/2022   BUN 20 02/28/2022   CREATININE 0.93 02/28/2022   CALCIUM 9.3 02/28/2022   MG 1.9 02/28/2022   Lab Results  Component Value Date   INR 1.0 02/28/2022   No results found for: "CHOL", "HDL", "LDLCALC", "TRIG"   GEN- The patient is well appearing, alert and oriented x 3 today.   Head- normocephalic, atraumatic Eyes-  Sclera clear, conjunctiva pink Ears- hearing intact Oropharynx- clear Neck- supple, no JVP Lymph- no cervical lymphadenopathy Lungs- Clear to ausculation bilaterally, normal work of breathing Heart- Regular rate and rhythm, no murmurs, rubs or gallops, PMI not laterally displaced GI- soft, NT, ND, + BS Extremities- no clubbing, cyanosis, or edema MS- no significant deformity or atrophy Skin- no rash or lesion Psych- euthymic mood, full affect Neuro- strength and sensation are intact  EKG-Vent. rate 68 BPM PR interval 148 ms QRS duration 88 ms QT/QTcB 438/465 ms P-R-T axes 58 -8 44 Normal sinus rhythm Normal ECG When compared with ECG of 28-Feb-2022 21:57, PREVIOUS ECG IS PRESENT    Assessment and Plan:  1. Afib General education re afib Triggers discussed  Smoking cessation encouraged Sleep study recommended for snoring and apnea per husband Pt prefers a home study as she feels she will not sleep outside of the home  Echo ordered   2. CHA2DS2VASc  score of 2 female, HTN, echo pending for any LV dysfunction ( ER stated 3) For now continue eliquis 5 mg bid  Bleeding precautions  dicussed   3. HTN Elevated here Check BP at home for f/u  F/u with Dr. 02-Mar-2022 9/5 as scheduled  Afib clinic as needed   Elease Hashimoto C. Lup[facility] [address] [place], [place] [phone]

## 2022-03-13 DIAGNOSIS — E782 Mixed hyperlipidemia: Secondary | ICD-10-CM | POA: Diagnosis not present

## 2022-03-13 DIAGNOSIS — Z0000 Encounter for general adult medical examination without abnormal findings: Secondary | ICD-10-CM | POA: Diagnosis not present

## 2022-03-13 DIAGNOSIS — I471 Supraventricular tachycardia: Secondary | ICD-10-CM | POA: Diagnosis not present

## 2022-03-13 DIAGNOSIS — I10 Essential (primary) hypertension: Secondary | ICD-10-CM | POA: Diagnosis not present

## 2022-03-13 DIAGNOSIS — E1169 Type 2 diabetes mellitus with other specified complication: Secondary | ICD-10-CM | POA: Diagnosis not present

## 2022-03-14 ENCOUNTER — Other Ambulatory Visit: Payer: Self-pay | Admitting: Family Medicine

## 2022-03-14 DIAGNOSIS — N63 Unspecified lump in unspecified breast: Secondary | ICD-10-CM

## 2022-03-18 DIAGNOSIS — F3342 Major depressive disorder, recurrent, in full remission: Secondary | ICD-10-CM | POA: Diagnosis not present

## 2022-03-18 DIAGNOSIS — F410 Panic disorder [episodic paroxysmal anxiety] without agoraphobia: Secondary | ICD-10-CM | POA: Diagnosis not present

## 2022-03-22 ENCOUNTER — Ambulatory Visit (HOSPITAL_COMMUNITY)
Admission: RE | Admit: 2022-03-22 | Discharge: 2022-03-22 | Disposition: A | Discharge status: Home / Self Care | Payer: Federal, State, Local not specified - PPO | Source: Ambulatory Visit | Attending: Nurse Practitioner | Admitting: Nurse Practitioner

## 2022-03-22 DIAGNOSIS — E119 Type 2 diabetes mellitus without complications: Secondary | ICD-10-CM | POA: Insufficient documentation

## 2022-03-22 DIAGNOSIS — I4891 Unspecified atrial fibrillation: Secondary | ICD-10-CM | POA: Insufficient documentation

## 2022-03-22 DIAGNOSIS — E785 Hyperlipidemia, unspecified: Secondary | ICD-10-CM | POA: Insufficient documentation

## 2022-03-22 DIAGNOSIS — I10 Essential (primary) hypertension: Secondary | ICD-10-CM | POA: Insufficient documentation

## 2022-03-22 LAB — ECHOCARDIOGRAM COMPLETE
Area-P 1/2: 2.23 cm2
S' Lateral: 3 cm

## 2022-03-22 NOTE — Progress Notes (Signed)
  Echocardiogram 2D Echocardiogram has been performed.  Leta Jungling M 03/22/2022, 2:26 PM

## 2022-03-25 ENCOUNTER — Telehealth: Payer: Self-pay | Admitting: *Deleted

## 2022-03-25 NOTE — Telephone Encounter (Signed)
Prior Authorization for [facility] TEST sent to [facility] via Phone. Reference # . NO PA REQ -CALL REF# NORMA S. 8/28

## 2022-03-25 NOTE — Telephone Encounter (Signed)
This is not an [facility] Sleep study. This is a Home sleep study set up by our sleep dept. I will remove this from my basket.

## 2022-04-02 ENCOUNTER — Encounter: Payer: Self-pay | Admitting: Cardiovascular Disease

## 2022-04-02 ENCOUNTER — Ambulatory Visit: Payer: Federal, State, Local not specified - PPO | Attending: Cardiovascular Disease | Admitting: Cardiovascular Disease

## 2022-04-02 VITALS — BP 140/86 | HR 88 | Ht 63.0 in | Wt 172.6 lb

## 2022-04-02 DIAGNOSIS — Z01810 Encounter for preprocedural cardiovascular examination: Secondary | ICD-10-CM

## 2022-04-02 DIAGNOSIS — R400 Somnolence: Secondary | ICD-10-CM

## 2022-04-02 DIAGNOSIS — I480 Paroxysmal atrial fibrillation: Secondary | ICD-10-CM | POA: Diagnosis not present

## 2022-04-02 DIAGNOSIS — R079 Chest pain, unspecified: Secondary | ICD-10-CM

## 2022-04-02 MED ORDER — METOPROLOL TARTRATE 100 MG PO TABS: ORAL_TABLET | ORAL | 0 refills | Status: DC

## 2022-04-02 NOTE — Progress Notes (Signed)
Cardiology Office Note:    Date:  04/02/2022   ID:  [name], DOB Jul 02, 1956, MRN [id number]  PCP:  Lawerance Cruel, MD   [place] [facility] Cardiologist: Leanard Dimaio   Referring MD: Angelina Pih, MD   Chief Complaint  Patient presents with   Atrial Fibrillation        History of Present Illness:   Sept. 5, 2023   [name] is a 66 y.o. female with a hx of  atrial fib, HTN, HLD , PSVT  Seen with husband, Sherrese Sanantonio was recently seen in the ER with palpitations - found to have Afib with RVR   Has been started on Elilquis 5 BID Has discussed sleep study with Doristine Devoid Smoking cessation encouraged   Echo 03/22/22: EF 60-65%, indeterminate diastolic function Trivial MR  Had stopped smoking for years,  Then restarted when her daughter passed away 16 months ago .  She had chest pain / back pain / left arm pain with the rapid atrial fib .   Has daytime sleepiness, Fatigue   Past Medical History:  Diagnosis Date   Diabetes mellitus without complication ([place])    Hyperlipidemia    Hypertension     Past Surgical History:  Procedure Laterality Date   APPENDECTOMY     CESAREAN SECTION      Current Medications: Current Meds  Medication Sig   ALPRAZolam (XANAX) 1 MG tablet Take 1 mg by mouth 3 (three) times daily as needed.   apixaban (ELIQUIS) 5 MG TABS tablet Take 1 tablet (5 mg total) by mouth 2 (two) times daily.   B Complex Vitamins (VITAMIN B COMPLEX) TABS Take 1 tablet by mouth 4 (four) times a week.   chlorthalidone (HYGROTON) 25 MG tablet 1 tablet in the morning   cholecalciferol (VITAMIN D3) 25 MCG (1000 UNIT) tablet Take 1,000 Units by mouth as needed. Vitamin D3/K2   Coenzyme Q10 (COQ10) 50 MG CAPS Take 1 tablet by mouth 4 (four) times a week.   JARDIANCE 10 MG TABS tablet Take 10 mg by mouth daily.   levalbuterol (XOPENEX HFA) 45 MCG/ACT inhaler 1 puff as needed   metaxalone (SKELAXIN) 800 MG tablet Take 800 mg by mouth 3  (three) times daily as needed.   metoprolol succinate (TOPROL-XL) 50 MG 24 hr tablet 1 TABLET ORALLY ONCE A DAY 90 DAYS   metoprolol tartrate (LOPRESSOR) 100 MG tablet Take 1 tablet by mouth two hours prior to scan   Multiple Vitamin (MULTIVITAMIN) tablet Take 1 tablet by mouth 4 (four) times a week. Calcium, Magnesium and Zinc   Potassium 99 MG TABS Take 2 tablets by mouth in the morning.   pravastatin (PRAVACHOL) 40 MG tablet Take 1 tablet by mouth daily.   propranolol (INDERAL) 10 MG tablet Taking 1-2 tablets by mouth as needed for A-fib   traZODone (DESYREL) 50 MG tablet Take 50-150 mg by mouth at bedtime.     Allergies:   Diltiazem hcl, Penicillins, Simvastatin, and Wellbutrin [bupropion]   Social History   Socioeconomic History   Marital status: Married    Spouse name: Not on file   Number of children: Not on file   Years of education: Not on file   Highest education level: Not on file  Occupational History   Not on file  Tobacco Use   Smoking status: Every Day    Types: Cigarettes   Smokeless tobacco: Never  Substance and Sexual Activity   Alcohol use: Not on file  Drug use: Not on file   Sexual activity: Not on file  Other Topics Concern   Not on file  Social History Narrative   Not on file   Social Determinants of Health   Financial Resource Strain: Not on file  Food Insecurity: Not on file  Transportation Needs: Not on file  Physical Activity: Not on file  Stress: Not on file  Social Connections: Not on file     Family History: The patient's family history is not on file.  ROS:   Please see the history of present illness.     All other systems reviewed and are negative.  EKGs/Labs/Other Studies Reviewed:    The following studies were reviewed today:   EKG:           Recent Labs: 02/28/2022: ALT 19; BUN 20; Creatinine, Ser 0.93; Hemoglobin 17.2; Magnesium 1.9; Platelets 160; Potassium 3.2; Sodium 139; TSH 3.532  Recent Lipid Panel No results  found for: "CHOL", "TRIG", "HDL", "CHOLHDL", "VLDL", "LDLCALC", "LDLDIRECT"  Risk Assessment/Calculations:    CHA2DS2-VASc Score = 2  This indicates a 2.2% annual risk of stroke. The patient's score is based upon: CHF History: 0 HTN History: 1 Diabetes History: 0 Stroke History: 0 Vascular Disease History: 0 Age Score: 0 Gender Score: 1      STOP-Bang Score:  5       Physical Exam:    VS:  BP (!) 140/86   Pulse 88   Ht 5\' 3"  (1.6 m)   Wt 172 lb 9.6 oz (78.3 kg)   LMP 10/26/1998 (Within Years) Comment: ~IN 40s  SpO2 98%   BMI 30.57 kg/m     Wt Readings from Last 3 Encounters:  04/02/22 172 lb 9.6 oz (78.3 kg)  03/12/22 174 lb 12.8 oz (79.3 kg)  07/06/20 196 lb 11.2 oz (89.2 kg)     GEN:  Well nourished, well developed in no acute distress HEENT: Normal NECK: No JVD; No carotid bruits LYMPHATICS: No lymphadenopathy CARDIAC: RRR, no murmurs, rubs, gallops RESPIRATORY:  Clear to auscultation without rales, wheezing or rhonchi  ABDOMEN: Soft, non-tender, non-distended MUSCULOSKELETAL:  No edema; No deformity  SKIN: Warm and dry NEUROLOGIC:  Alert and oriented x 3 PSYCHIATRIC:  Normal affect   ASSESSMENT:    1. Chest pain, unspecified type   2. Daytime somnolence   3. PAF (paroxysmal atrial fibrillation) (HCC)   4. Preop cardiovascular exam    PLAN:    In order of problems listed above:  Chest tightness: The patient had an episode of chest tightness and pressure with intrascapular pain while she was in atrial fibrillation.  While this may have been just related to her rapid ventricular response she does have a long history of cigarette smoking and is at high risk of having coronary artery disease.  We will schedule her for a coronary CT angiogram for further evaluation  2.  Paroxysmal atrial fibrillation: She was noted to be in atrial fibrillation by EMS.  She is on Eliquis.  We will continue current medications.  3.  Daytime somnolence: She complains of  having lots of sleepiness.  Her husband notes that she snores.  We will do a screening for the home sleep apnea study.           Medication Adjustments/Labs and Tests Ordered: Current medicines are reviewed at length with the patient today.  Concerns regarding medicines are outlined above.  Orders Placed This Encounter  Procedures   CT CORONARY MORPH W/CTA COR W/SCORE  W/CA W/CM &/OR WO/CM   Basic metabolic panel   Ambulatory referral to Cardiac Electrophysiology   Split night study   Meds ordered this encounter  Medications   metoprolol tartrate (LOPRESSOR) 100 MG tablet    Sig: Take 1 tablet by mouth two hours prior to scan    Dispense:  1 tablet    Refill:  0    Patient Instructions  Medication Instructions:  Your physician recommends that you continue on your current medications as directed. Please refer to the Current Medication list given to you today.  *If you need a refill on your cardiac medications before your next appointment, please call your pharmacy*   Lab Work: BMET Today If you have labs (blood work) drawn today and your tests are completely normal, you will receive your results only by: MyChart Message (if you have MyChart) OR A paper copy in the mail If you have any lab test that is abnormal or we need to change your treatment, we will call you to review the results.   Testing/Procedures: [facility] sleep study Your physician has recommended that you have a sleep study. This test records several body functions during sleep, including: brain activity, eye movement, oxygen and carbon dioxide blood levels, heart rate and rhythm, breathing rate and rhythm, the flow of air through your mouth and nose, snoring, body muscle movements, and chest and belly movement.  Ambulatory Referral to EP [facility])  Coronary CT Angiogram Your physician has requested that you have cardiac CT. Cardiac computed tomography (CT) is a painless test that uses an x-ray machine to take  clear, detailed pictures of your heart. For further information please visit [email or web address] Please follow instruction sheet as given.  Follow-Up: At [facility], you and your health needs are our priority.  As part of our continuing mission to provide you with exceptional heart care, we have created designated Provider Care Teams.  These Care Teams include your primary Cardiologist (physician) and Advanced Practice Providers (APPs -  Physician Assistants and Nurse Practitioners) who all work together to provide you with the care you need, when you need it.  We recommend signing up for the patient portal called "MyChart".  Sign up information is provided on this After Visit Summary.  MyChart is used to connect with patients for Virtual Visits (Telemedicine).  Patients are able to view lab/test results, encounter notes, upcoming appointments, etc.  Non-urgent messages can be sent to your provider as well.   To learn more about what you can do with MyChart, go to [email or web address].    Your next appointment:   3 month(s)  The format for your next appointment:   In Person  Provider:   Kristeen Miss, MD  Other Instructions   Your cardiac CT will be scheduled at:   [facility] [address] [place], [place] [address] [phone]  please arrive at the [facility] and Children's Entrance (Entrance C2) of [facility] 30 minutes prior to test start time. You can use the FREE valet parking offered at entrance C (encouraged to control the heart rate for the test)  Proceed to the [facility] Radiology Department (first floor) to check-in and test prep.  All radiology patients and guests should use entrance C2 at [facility], accessed from [facility], even though the hospital's physical address listed is [address]     Please follow these instructions carefully (unless otherwise directed):  On the Night Before the  Test: Be  sure to Drink plenty of water. Do not consume any caffeinated/decaffeinated beverages or chocolate 12 hours prior to your test. Do not take any antihistamines 12 hours prior to your test.  On the Day of the Test: Drink plenty of water until 1 hour prior to the test. Do not eat any food 4 hours prior to the test. You may take your regular medications prior to the test.  Take metoprolol (Lopressor) two hours prior to test. FEMALES- please wear underwire-free bra if available, avoid dresses & tight clothing      After the Test: Drink plenty of water. After receiving IV contrast, you may experience a mild flushed feeling. This is normal. On occasion, you may experience a mild rash up to 24 hours after the test. This is not dangerous. If this occurs, you can take Benadryl 25 mg and increase your fluid intake. If you experience trouble breathing, this can be serious. If it is severe call 911 IMMEDIATELY. If it is mild, please call our office. If you take any of these medications: Glipizide/Metformin, Avandament, Glucavance, please do not take 48 hours after completing test unless otherwise instructed.  We will call to schedule your test 2-4 weeks out understanding that some insurance companies will need an authorization prior to the service being performed.   For non-scheduling related questions, please contact the cardiac imaging nurse navigator should you have any questions/concerns: Marchia Bond, Cardiac Imaging Nurse Navigator Gordy Clement, Cardiac Imaging Nurse Navigator [place] [facility] Direct Office Dial: [phone]   For scheduling needs, including cancellations and rescheduling, please call [place], [phone].   Important Information About Sugar         Signed, Mertie Moores, MD  04/02/2022 5:27 PM    [place]

## 2022-04-02 NOTE — Patient Instructions (Addendum)
Medication Instructions:  Your physician recommends that you continue on your current medications as directed. Please refer to the Current Medication list given to you today.  *If you need a refill on your cardiac medications before your next appointment, please call your pharmacy*   Lab Work: BMET Today If you have labs (blood work) drawn today and your tests are completely normal, you will receive your results only by: MyChart Message (if you have MyChart) OR A paper copy in the mail If you have any lab test that is abnormal or we need to change your treatment, we will call you to review the results.   Testing/Procedures: [facility] sleep study Your physician has recommended that you have a sleep study. This test records several body functions during sleep, including: brain activity, eye movement, oxygen and carbon dioxide blood levels, heart rate and rhythm, breathing rate and rhythm, the flow of air through your mouth and nose, snoring, body muscle movements, and chest and belly movement.  Ambulatory Referral to EP Lalla Brothers)  Coronary CT Angiogram Your physician has requested that you have cardiac CT. Cardiac computed tomography (CT) is a painless test that uses an x-ray machine to take clear, detailed pictures of your heart. For further information please visit [email or web address] Please follow instruction sheet as given.  Follow-Up: At [facility], you and your health needs are our priority.  As part of our continuing mission to provide you with exceptional heart care, we have created designated Provider Care Teams.  These Care Teams include your primary Cardiologist (physician) and Advanced Practice Providers (APPs -  Physician Assistants and Nurse Practitioners) who all work together to provide you with the care you need, when you need it.  We recommend signing up for the patient portal called "MyChart".  Sign up information is provided on this After Visit Summary.  MyChart  is used to connect with patients for Virtual Visits (Telemedicine).  Patients are able to view lab/test results, encounter notes, upcoming appointments, etc.  Non-urgent messages can be sent to your provider as well.   To learn more about what you can do with MyChart, go to [email or web address].    Your next appointment:   3 month(s)  The format for your next appointment:   In Person  Provider:   Kristeen Miss, MD  Other Instructions   Your cardiac CT will be scheduled at:   [facility] [address] [place], [place] [address] [phone]  please arrive at the [facility] and Children's Entrance (Entrance C2) of [facility] 30 minutes prior to test start time. You can use the FREE valet parking offered at entrance C (encouraged to control the heart rate for the test)  Proceed to the [facility] Radiology Department (first floor) to check-in and test prep.  All radiology patients and guests should use entrance C2 at [facility], accessed from [facility], even though the hospital's physical address listed is [address]     Please follow these instructions carefully (unless otherwise directed):  On the Night Before the Test: Be sure to Drink plenty of water. Do not consume any caffeinated/decaffeinated beverages or chocolate 12 hours prior to your test. Do not take any antihistamines 12 hours prior to your test.  On the Day of the Test: Drink plenty of water until 1 hour prior to the test. Do not eat any food 4 hours prior to the test. You may take your regular medications prior to the test.  Take metoprolol (Lopressor) two hours prior to test. FEMALES- please wear underwire-free bra if available, avoid dresses & tight clothing      After the Test: Drink plenty of water. After receiving IV contrast, you may experience a mild flushed feeling. This is normal. On occasion, you may experience a mild rash up to 24 hours  after the test. This is not dangerous. If this occurs, you can take Benadryl 25 mg and increase your fluid intake. If you experience trouble breathing, this can be serious. If it is severe call 911 IMMEDIATELY. If it is mild, please call our office. If you take any of these medications: Glipizide/Metformin, Avandament, Glucavance, please do not take 48 hours after completing test unless otherwise instructed.  We will call to schedule your test 2-4 weeks out understanding that some insurance companies will need an authorization prior to the service being performed.   For non-scheduling related questions, please contact the cardiac imaging nurse navigator should you have any questions/concerns: Rockwell Alexandria, Cardiac Imaging Nurse Navigator Larey Brick, Cardiac Imaging Nurse Navigator  [facility] Services Direct Office Dial: [phone]   For scheduling needs, including cancellations and rescheduling, please call [place], [phone].   Important Information About Sugar

## 2022-04-03 LAB — BASIC METABOLIC PANEL
BUN/Creatinine Ratio: 27 (ref 12–28)
BUN: 27 mg/dL (ref 8–27)
CO2: 29 mmol/L (ref 20–29)
Calcium: 10 mg/dL (ref 8.7–10.3)
Chloride: 97 mmol/L (ref 96–106)
Creatinine, Ser: 1.01 mg/dL — ABNORMAL HIGH (ref 0.57–1.00)
Glucose: 106 mg/dL — ABNORMAL HIGH (ref 70–99)
Potassium: 3.5 mmol/L (ref 3.5–5.2)
Sodium: 142 mmol/L (ref 134–144)
eGFR: 62 mL/min/{1.73_m2} (ref >59)

## 2022-04-11 ENCOUNTER — Other Ambulatory Visit: Payer: Federal, State, Local not specified - PPO

## 2022-04-29 ENCOUNTER — Ambulatory Visit
Admission: RE | Admit: 2022-04-29 | Discharge: 2022-04-29 | Disposition: A | Discharge status: Home / Self Care | Payer: Federal, State, Local not specified - PPO | Source: Ambulatory Visit | Attending: Family Medicine | Admitting: Family Medicine

## 2022-04-29 DIAGNOSIS — N63 Unspecified lump in unspecified breast: Secondary | ICD-10-CM

## 2022-04-29 DIAGNOSIS — N6312 Unspecified lump in the right breast, upper inner quadrant: Secondary | ICD-10-CM | POA: Diagnosis not present

## 2022-04-29 DIAGNOSIS — R928 Other abnormal and inconclusive findings on diagnostic imaging of breast: Secondary | ICD-10-CM | POA: Diagnosis not present

## 2022-05-01 ENCOUNTER — Telehealth (HOSPITAL_COMMUNITY): Payer: Self-pay | Admitting: Emergency Medicine

## 2022-05-01 ENCOUNTER — Telehealth (HOSPITAL_COMMUNITY): Payer: Self-pay | Admitting: *Deleted

## 2022-05-01 NOTE — Telephone Encounter (Signed)
Patient returning call regarding upcoming cardiac imaging study; pt verbalizes understanding of appt date/time, parking situation and where to check in, medications ordered,; name and call back number provided for further questions should they arise  Gordy Clement RN Navigator Cardiac Imaging [facility] [phone] office [phone] cell   Patient to take 100mg  metoprolol tartrate two hours prior to her cardiac CT scan. She is aware to arrive at 12:30pm.

## 2022-05-01 NOTE — Telephone Encounter (Signed)
Attempted to call patient regarding upcoming cardiac CT appointment. °Left message on voicemail with name and callback number °Moises Terpstra RN Navigator Cardiac Imaging °[place] [facility] °[phone] Office °[phone] Cell ° °

## 2022-05-02 ENCOUNTER — Ambulatory Visit (HOSPITAL_COMMUNITY)
Admission: RE | Admit: 2022-05-02 | Discharge: 2022-05-02 | Disposition: A | Discharge status: Home / Self Care | Payer: Federal, State, Local not specified - PPO | Source: Ambulatory Visit | Attending: Cardiovascular Disease | Admitting: Cardiovascular Disease

## 2022-05-02 DIAGNOSIS — R079 Chest pain, unspecified: Secondary | ICD-10-CM | POA: Insufficient documentation

## 2022-05-02 MED ORDER — NITROGLYCERIN 0.4 MG SL SUBL
SUBLINGUAL_TABLET | SUBLINGUAL | Status: AC
  Filled 2022-05-02: qty 2

## 2022-05-02 MED ORDER — IOHEXOL 350 MG/ML SOLN
95.0000 mL | Freq: Once | INTRAVENOUS | Status: AC | PRN
  Administered 2022-05-02: 95 mL via INTRAVENOUS

## 2022-05-02 MED ORDER — NITROGLYCERIN 0.4 MG SL SUBL
0.8000 mg | SUBLINGUAL_TABLET | Freq: Once | SUBLINGUAL | Status: AC
  Administered 2022-05-02: 0.8 mg via SUBLINGUAL

## 2022-05-03 ENCOUNTER — Ambulatory Visit (HOSPITAL_BASED_OUTPATIENT_CLINIC_OR_DEPARTMENT_OTHER): Payer: Federal, State, Local not specified - PPO | Admitting: Cardiology

## 2022-05-03 DIAGNOSIS — R0683 Snoring: Secondary | ICD-10-CM

## 2022-05-03 DIAGNOSIS — R0681 Apnea, not elsewhere classified: Secondary | ICD-10-CM

## 2022-05-07 NOTE — Procedures (Signed)
Erroneous encounter

## 2022-05-07 NOTE — Progress Notes (Signed)
This encounter was created in error - please disregard.

## 2022-05-08 ENCOUNTER — Telehealth: Payer: Self-pay | Admitting: Cardiovascular Disease

## 2022-05-08 ENCOUNTER — Telehealth: Payer: Self-pay

## 2022-05-08 NOTE — Telephone Encounter (Signed)
Pt is returning call in regards to CT results. Transferred to [place], [place]

## 2022-05-08 NOTE — Telephone Encounter (Signed)
-----   Message from Thayer Headings, MD sent at 05/06/2022  5:29 PM EDT ----- RUL lung nodule  Please refer to pulmonary nodule clinic for follow up   Coronary artery calcium score 47 Agatston units. This places the patient in the [place] percentile for age and gender, suggesting intermediate risk for future events.  She has non obstructive CAD presently  Please encourage her to stop smoking . Her LDL goal is < 55. I do not have lipid levels available  She is on Pravastatin,  did not tolerate Simvastatin   Will ask her to work on improving her diet , exercise Lipids / ALT in 3 months   Consider referral to lipid clinic for consideration of a PCSK9 inhibitor if her levels are not at goal.

## 2022-05-08 NOTE — Telephone Encounter (Signed)
Called and spoke with patient about CT results. She states that Dr Harrington Challenger, her PCP, monitors her lipids and she will be having them checked again in February. I asked that she have a copy of these sent to our office and she understands that based on her coronary calcium, her LDL goal needs to be less than 55. She is also aware of the pulmonary nodule, states she has this scanned annually to bi-annually also ordered through PCP and that she knows the nodule has grown slightly, but states it's been there since she was in her early 20's. She does not want the referral placed and states she will call them to have it checked in 6 months as recommended.

## 2022-05-17 ENCOUNTER — Ambulatory Visit (HOSPITAL_BASED_OUTPATIENT_CLINIC_OR_DEPARTMENT_OTHER): Payer: Federal, State, Local not specified - PPO | Attending: Nurse Practitioner | Admitting: Cardiovascular Disease

## 2022-05-17 DIAGNOSIS — R0683 Snoring: Secondary | ICD-10-CM | POA: Insufficient documentation

## 2022-05-17 DIAGNOSIS — G4733 Obstructive sleep apnea (adult) (pediatric): Secondary | ICD-10-CM | POA: Insufficient documentation

## 2022-05-17 DIAGNOSIS — R0681 Apnea, not elsewhere classified: Secondary | ICD-10-CM

## 2022-05-27 ENCOUNTER — Encounter (HOSPITAL_BASED_OUTPATIENT_CLINIC_OR_DEPARTMENT_OTHER): Payer: Self-pay | Admitting: Cardiovascular Disease

## 2022-05-27 NOTE — Procedures (Signed)
      Patient Name: [name], [name] Date: 05/17/2022 Gender: Female D.O.B: 1956-04-13 Age (years): 66 Referring Provider: Sherran Needs Height (inches): 27 Interpreting Physician: Shelva Majestic MD, ABSM Weight (lbs): 168 RPSGT: Jacolyn Reedy BMI: 30 MRN: [id number] Neck Size: 14.00  CLINICAL INFORMATION Sleep Study Type: HST  Indication for sleep study: Atrial Fibrillation, HTN, snoring, witnessed apnea  Epworth Sleepiness Score: 4  SLEEP STUDY TECHNIQUE A multi-channel overnight portable sleep study was performed. The channels recorded were: nasal airflow, thoracic respiratory movement, and oxygen saturation with a pulse oximetry. Snoring was also monitored.  MEDICATIONS ALPRAZolam (XANAX) 1 MG tablet apixaban (ELIQUIS) 5 MG TABS tablet (Expired) B Complex Vitamins (VITAMIN B COMPLEX) TABS chlorthalidone (HYGROTON) 25 MG tablet cholecalciferol (VITAMIN D3) 25 MCG (1000 UNIT) tablet Coenzyme Q10 (COQ10) 50 MG CAPS JARDIANCE 10 MG TABS tablet levalbuterol (XOPENEX HFA) 45 MCG/ACT inhaler metaxalone (SKELAXIN) 800 MG tablet metoprolol succinate (TOPROL-XL) 50 MG 24 hr tablet metoprolol tartrate (LOPRESSOR) 100 MG tablet Multiple Vitamin (MULTIVITAMIN) tablet Potassium 99 MG TABS pravastatin (PRAVACHOL) 40 MG tablet propranolol (INDERAL) 10 MG tablet traZODone (DESYREL) 50 MG tablet Patient self administered medications include: N/A.  SLEEP ARCHITECTURE Patient was studied for 431 minutes. The sleep efficiency was 100.0 % and the patient was supine for 0%. The arousal index was 0.0 per hour.  RESPIRATORY PARAMETERS The overall AHI was 24.2 per hour, with a central apnea index of 0 per hour. There is a positional component with severe slep apnea with supine sleep (AHI 41.6/h) versus non-supine sleep (AHI 11.4/h)  The oxygen nadir was 88% during sleep.  CARDIAC DATA Mean heart rate during sleep was 68.5 bpm. Heart rate range 51 - 88 bpm.    IMPRESSIONS - Moderate obstructive sleep apnea overall (AHI  24.2/h); however, sleep apnea was severe with supine sleep (AHI 41.6/h) - Mild oxygen desaturation to a nmair of 88%. - Patient snored for 116.7 minutes (27.1%) during the sleep.  DIAGNOSIS - Obstructive Sleep Apnea (G47.33)  RECOMMENDATIONS - In this patient with cardiovascular comorbidities recommend therapeutic CPAP for treatment of her sleep disordered breathing. Can itiate a trial of Auto-PAP with EPR of 3 at 7 - 16 cm of water. - Effort should be made to optimize nasal and oropharyngeal patency - Positional therapy avoiding supine position during sleep. - Avoid alcohol, sedatives and other CNS depressants that may worsen sleep apnea and disrupt normal sleep architecture. - Sleep hygiene should be reviewed to assess factors that may improve sleep quality. - Weight management and regular exercise should be initiated or continued. - Return to Sleep Center to discuss the results of this study   [Electronically signed] 05/27/2022 07:20 AM  Shelva Majestic MD, [facility], ABSM Diplomate, American Board of Sleep Medicine  NPI: [id number]  [place] PH: [phone]   FX: [phone] [place]

## 2022-05-29 NOTE — Progress Notes (Unsigned)
Electrophysiology Office Note:    Date:  05/29/2022   ID:  [name], DOB 09-11-1955, MRN [id number]  PCP:  Lawerance Cruel, MD  [facility] Cardiologist:  None  [facility] Electrophysiologist:  Vickie Epley, MD   Referring MD: Thayer Headings, MD   Chief Complaint: Atrial fibrillation  History of Present Illness:    [name] is a 66 y.o. female who presents for an evaluation of atrial fibrillation at the request of Dr. Acie Fredrickson. Their medical history includes diabetes, hypertension, hyperlipidemia.  The patient last saw Dr. Acie Fredrickson April 02, 2022.  She is on Eliquis 5 mg by mouth twice daily. While in atrial fibrillation she experienced chest pain.  EMS found her in tachycardia.  The automated read of the EKG showed atrial fibrillation although the rhythm is regular.    Past Medical History:  Diagnosis Date   Diabetes mellitus without complication ([place])    Hyperlipidemia    Hypertension     Past Surgical History:  Procedure Laterality Date   APPENDECTOMY     CESAREAN SECTION      Current Medications: No outpatient medications have been marked as taking for the 05/30/22 encounter (Appointment) with Vickie Epley, MD.     Allergies:   Diltiazem hcl, Penicillins, Simvastatin, and Wellbutrin [bupropion]   Social History   Socioeconomic History   Marital status: Married    Spouse name: Not on file   Number of children: Not on file   Years of education: Not on file   Highest education level: Not on file  Occupational History   Not on file  Tobacco Use   Smoking status: Every Day    Types: Cigarettes   Smokeless tobacco: Never  Substance and Sexual Activity   Alcohol use: Not on file   Drug use: Not on file   Sexual activity: Not on file  Other Topics Concern   Not on file  Social History Narrative   Not on file   Social Determinants of Health   Financial Resource Strain: Not on file  Food Insecurity: Not on file  Transportation  Needs: Not on file  Physical Activity: Not on file  Stress: Not on file  Social Connections: Not on file     Family History: The patient's family history includes Breast cancer in her paternal aunt. There is no history of BRCA 1/2.  ROS:   Please see the history of present illness.    All other systems reviewed and are negative.  EKGs/Labs/Other Studies Reviewed:    The following studies were reviewed today:  EMS records reviewed.  Her initial EKG appears to be in SVT.  It is narrow complex and regular with intermittent PVCs.   May 02, 2022 CTA coronary  IMPRESSION: 1. Coronary artery calcium score 47 Agatston units. This places the patient in the [place] percentile for age and gender, suggesting intermediate risk for future events. 2.  Mild nonobstructive coronary disease.   March 22, 2022 echo EF normal, 60% RV normal Trivial MR   EKG:  The ekg ordered today demonstrates ***   Recent Labs: 02/28/2022: ALT 19; Hemoglobin 17.2; Magnesium 1.9; Platelets 160; TSH 3.532 04/02/2022: BUN 27; Creatinine, Ser 1.01; Potassium 3.5; Sodium 142  Recent Lipid Panel No results found for: "CHOL", "TRIG", "HDL", "CHOLHDL", "VLDL", "LDLCALC", "LDLDIRECT"  Physical Exam:    VS:  LMP 10/26/1998 (Within Years) Comment: ~IN 40s    Wt Readings from Last 3 Encounters:  04/02/22 172 lb 9.6 oz (78.3  kg)  03/12/22 174 lb 12.8 oz (79.3 kg)  07/06/20 196 lb 11.2 oz (89.2 kg)     GEN: *** Well nourished, well developed in no acute distress HEENT: Normal NECK: No JVD; No carotid bruits LYMPHATICS: No lymphadenopathy CARDIAC: ***RRR, no murmurs, rubs, gallops RESPIRATORY:  Clear to auscultation without rales, wheezing or rhonchi  ABDOMEN: Soft, non-tender, non-distended MUSCULOSKELETAL:  No edema; No deformity  SKIN: Warm and dry NEUROLOGIC:  Alert and oriented x 3 PSYCHIATRIC:  Normal affect       ASSESSMENT:    1. Supraventricular tachycardia    PLAN:    In order of  problems listed above:  #SVT The patient carries a diagnosis of paroxysmal atrial fibrillation although I can only see an EKG from an EMS crew that appears to demonstrate SVT.  2-week ZIO monitor  Follow-up in 4 weeks.  If no recurrent episode, loop recorder.       Total time spent with patient today *** minutes. This includes reviewing records, evaluating the patient and coordinating care.  Medication Adjustments/Labs and Tests Ordered: Current medicines are reviewed at length with the patient today.  Concerns regarding medicines are outlined above.  No orders of the defined types were placed in this encounter.  No orders of the defined types were placed in this encounter.    Signed, Hilton Cork. Quentin Ore, MD, [facility], [facility] 05/29/2022 1:29 PM    Electrophysiology [facility]

## 2022-05-30 ENCOUNTER — Ambulatory Visit: Payer: Federal, State, Local not specified - PPO | Attending: Cardiology | Admitting: Cardiology

## 2022-05-30 ENCOUNTER — Ambulatory Visit: Payer: Federal, State, Local not specified - PPO | Attending: Cardiology

## 2022-05-30 ENCOUNTER — Encounter: Payer: Self-pay | Admitting: Cardiology

## 2022-05-30 VITALS — BP 132/82 | HR 83 | Ht 63.0 in | Wt 170.0 lb

## 2022-05-30 DIAGNOSIS — I471 Supraventricular tachycardia, unspecified: Secondary | ICD-10-CM

## 2022-05-30 NOTE — Progress Notes (Signed)
Electrophysiology Office Note:    Date:  05/30/2022   ID:  Darleny Turner, DOB [birth year], MRN [id number]  PCP:  Lawerance Cruel, MD  [facility] Cardiologist:  None  [facility] Electrophysiologist:  Vickie Epley, MD   Referring MD: Thayer Headings, MD   Chief Complaint: Atrial fibrillation  History of Present Illness:    [name] is a 66 y.o. female who presents for an evaluation of atrial fibrillation at the request of Dr. Acie Fredrickson. Their medical history includes diabetes, hypertension, hyperlipidemia.  The patient last saw Dr. Acie Fredrickson April 02, 2022.  She is on Eliquis 5 mg by mouth twice daily.  While in atrial fibrillation she experienced chest pain.  EMS found her in tachycardia.  The automated read of the EKG showed atrial fibrillation although the rhythm is regular.  Today, she is accompanied by a family member. She reports knowing about her fast heart beats since her mid 20's. These episodes may occur more frequently depending on factors such as stress. However, she has not noticed it enough to be sure how frequent the episodes may be in a month. She has only presented to the hospital 3-4 times to resolve her arrhythmia, which was resolved each time with adenosine.  Of note, she states that she passed multiple blood clots through her bowels when she was last at the hospital.  Typically she is active with yard work.  She denies any chest pain, shortness of breath, or peripheral edema. No lightheadedness, headaches, syncope, orthopnea, or PND.     Past Medical History:  Diagnosis Date   Diabetes mellitus without complication ([place])    Hyperlipidemia    Hypertension     Past Surgical History:  Procedure Laterality Date   APPENDECTOMY     CESAREAN SECTION      Current Medications: Current Meds  Medication Sig   ALPRAZolam (XANAX) 1 MG tablet Take 1 mg by mouth 3 (three) times daily as needed.   B Complex Vitamins (VITAMIN B COMPLEX) TABS Take 1 tablet  by mouth 4 (four) times a week.   chlorthalidone (HYGROTON) 25 MG tablet 1 tablet in the morning   cholecalciferol (VITAMIN D3) 25 MCG (1000 UNIT) tablet Take 1,000 Units by mouth as needed. Vitamin D3/K2   Coenzyme Q10 (COQ10) 50 MG CAPS Take 1 tablet by mouth 4 (four) times a week.   JARDIANCE 10 MG TABS tablet Take 10 mg by mouth daily.   levalbuterol (XOPENEX HFA) 45 MCG/ACT inhaler 1 puff as needed   metaxalone (SKELAXIN) 800 MG tablet Take 800 mg by mouth 3 (three) times daily as needed.   metoprolol succinate (TOPROL-XL) 50 MG 24 hr tablet 1 TABLET ORALLY ONCE A DAY 90 DAYS   metoprolol tartrate (LOPRESSOR) 100 MG tablet Take 1 tablet by mouth two hours prior to scan   Multiple Vitamin (MULTIVITAMIN) tablet Take 1 tablet by mouth 4 (four) times a week. Calcium, Magnesium and Zinc   Potassium 99 MG TABS Take 2 tablets by mouth in the morning.   pravastatin (PRAVACHOL) 40 MG tablet Take 1 tablet by mouth daily.   propranolol (INDERAL) 10 MG tablet Taking 1-2 tablets by mouth as needed for A-fib   traZODone (DESYREL) 50 MG tablet Take 50-150 mg by mouth at bedtime.     Allergies:   Diltiazem hcl, Penicillins, Simvastatin, and Wellbutrin [bupropion]   Social History   Socioeconomic History   Marital status: Married    Spouse name: Not on file   Number  of children: Not on file   Years of education: Not on file   Highest education level: Not on file  Occupational History   Not on file  Tobacco Use   Smoking status: Every Day    Types: Cigarettes   Smokeless tobacco: Never  Substance and Sexual Activity   Alcohol use: Not on file   Drug use: Not on file   Sexual activity: Not on file  Other Topics Concern   Not on file  Social History Narrative   Not on file   Social Determinants of Health   Financial Resource Strain: Not on file  Food Insecurity: Not on file  Transportation Needs: Not on file  Physical Activity: Not on file  Stress: Not on file  Social Connections:  Not on file     Family History: The patient's family history includes Breast cancer in her paternal aunt. There is no history of BRCA 1/2.  ROS:   Please see the history of present illness.    (+) Palpitations All other systems reviewed and are negative.  EKGs/Labs/Other Studies Reviewed:    The following studies were reviewed today:  EMS records reviewed.  Her initial EKG appears to be in SVT.  It is narrow complex and regular with intermittent PVCs.   May 02, 2022 CTA coronary  IMPRESSION: 1. Coronary artery calcium score 47 Agatston units. This places the patient in the [place] percentile for age and gender, suggesting intermediate risk for future events. 2.  Mild nonobstructive coronary disease.  March 22, 2022 echo EF normal, 60% RV normal Trivial MR  EKG:  The ekg ordered today demonstrates sinus rhythm.  No preexcitation.  1 PVC.   Recent Labs: 02/28/2022: ALT 19; Hemoglobin 17.2; Magnesium 1.9; Platelets 160; TSH 3.532 04/02/2022: BUN 27; Creatinine, Ser 1.01; Potassium 3.5; Sodium 142   Recent Lipid Panel No results found for: "CHOL", "TRIG", "HDL", "CHOLHDL", "VLDL", "LDLCALC", "LDLDIRECT"  Physical Exam:    VS:  BP 132/82   Pulse 83   Ht _0  (1.6 m)   Wt 170 lb (77.1 kg)   LMP 10/26/1998 (Within Years) Comment: ~IN 40s  SpO2 97%   BMI 30.11 kg/m     Wt Readings from Last 3 Encounters:  05/30/22 170 lb (77.1 kg)  04/02/22 172 lb 9.6 oz (78.3 kg)  03/12/22 174 lb 12.8 oz (79.3 kg)     GEN: Well nourished, well developed in no acute distress HEENT: Normal NECK: No JVD; No carotid bruits LYMPHATICS: No lymphadenopathy CARDIAC: RRR, no murmurs, rubs, gallops RESPIRATORY:  Clear to auscultation without rales, wheezing or rhonchi  ABDOMEN: Soft, non-tender, non-distended MUSCULOSKELETAL:  No edema; No deformity  SKIN: Warm and dry NEUROLOGIC:  Alert and oriented x 3 PSYCHIATRIC:  Normal affect       ASSESSMENT:    1. Supraventricular  tachycardia    PLAN:    In order of problems listed above:  #SVT The patient carries a diagnosis of paroxysmal atrial fibrillation although I can only see an EKG from an EMS crew that appears to demonstrate SVT.  I suspect she is actually having salvos of SVT.  This also fits with her history better since her symptoms started in her 4s.  I am planning to do a 2-week ZIO monitor to confirm no episodes of atrial fibrillation.  If there are no episodes of atrial fibrillation she can stop her Eliquis and start a baby aspirin once daily.  I will have her follow-up in 6 to 8  weeks with an APP.  2-week ZIO monitor  Medication Adjustments/Labs and Tests Ordered: Current medicines are reviewed at length with the patient today.  Concerns regarding medicines are outlined above.   No orders of the defined types were placed in this encounter.  No orders of the defined types were placed in this encounter.  I,Mathew Stumpf,acting as a Education administrator for Vickie Epley, MD.,have documented all relevant documentation on the behalf of Vickie Epley, MD,as directed by  Vickie Epley, MD while in the presence of Vickie Epley, MD.  I, Vickie Epley, MD, have reviewed all documentation for this visit. The documentation on 05/30/22 for the exam, diagnosis, procedures, and orders are all accurate and complete.   Signed, Hilton Cork. Quentin Ore, MD, [facility], [facility] 05/30/2022 10:14 AM    Electrophysiology [facility]

## 2022-05-30 NOTE — Progress Notes (Unsigned)
Enrolled patient for a 14 day Zio XT  monitor to be mailed to patients home  °

## 2022-05-30 NOTE — Patient Instructions (Signed)
Medication Instructions:  none *If you need a refill on your cardiac medications before your next appointment, please call your pharmacy*   Lab Work: none If you have labs (blood work) drawn today and your tests are completely normal, you will receive your results only by: [place] (if you have MyChart) OR A paper copy in the mail If you have any lab test that is abnormal or we need to change your treatment, we will call you to review the results.   Testing/Procedures: Your physician has recommended that you wear a heart monitor. Heart monitors are medical devices that record the heart's electrical activity. Doctors most often use these monitors to diagnose arrhythmias. Arrhythmias are problems with the speed or rhythm of the heartbeat. The monitor is a small, portable device. You can wear one while you do your normal daily activities. This is usually used to diagnose what is causing palpitations/syncope (passing out).    Follow-Up: At [facility], you and your health needs are our priority.  As part of our continuing mission to provide you with exceptional heart care, we have created designated Provider Care Teams.  These Care Teams include your primary Cardiologist (physician) and Advanced Practice Providers (APPs -  Physician Assistants and Nurse Practitioners) who all work together to provide you with the care you need, when you need it.  We recommend signing up for the patient portal called "MyChart".  Sign up information is provided on this After Visit Summary.  MyChart is used to connect with patients for Virtual Visits (Telemedicine).  Patients are able to view lab/test results, encounter notes, upcoming appointments, etc.  Non-urgent messages can be sent to your provider as well.   To learn more about what you can do with MyChart, go to [email or web address].    Your next appointment:   6-8 week(s)  The format for your next appointment:   In Person  Provider:    You will see one of the following Advanced Practice Providers on your designated Care Team:   Tommye Standard, [place] Legrand Como "Jonni Sanger" Chalmers Cater, [place]      Other Instructions Bryn Gulling- Long Term Monitor Instructions  Your physician has requested you wear a ZIO patch monitor for 14 days.  This is a single patch monitor. [employer] supplies one patch monitor per enrollment. Additional stickers are not available. Please do not apply patch if you will be having a Nuclear Stress Test,  Echocardiogram, Cardiac CT, MRI, or Chest Xray during the period you would be wearing the  monitor. The patch cannot be worn during these tests. You cannot remove and re-apply the  ZIO XT patch monitor.  Your ZIO patch monitor will be mailed 3 day USPS to your address on file. It may take 3-5 days  to receive your monitor after you have been enrolled.  Once you have received your monitor, please review the enclosed instructions. Your monitor  has already been registered assigning a specific monitor serial # to you.  Billing and Patient Assistance Program Information  We have supplied [employer] with any of your insurance information on file for billing purposes. [employer] offers a sliding scale Patient Assistance Program for patients that do not have  insurance, or whose insurance does not completely cover the cost of the ZIO monitor.  You must apply for the Patient Assistance Program to qualify for this discounted rate.  To apply, please call [employer] at [phone], select option 4, select option 2, ask to apply for  Patient Assistance Program. [name]  will ask your household income, and how many people  are in your household. They will quote your out-of-pocket cost based on that information.  [employer] will also be able to set up a 58-month, interest-free payment plan if needed.  Applying the monitor   Shave hair from upper left chest.  Hold abrader disc by orange tab. Rub abrader in 40 strokes over the upper left chest  as  indicated in your monitor instructions.  Clean area with 4 enclosed alcohol pads. Let dry.  Apply patch as indicated in monitor instructions. Patch will be placed under collarbone on left  side of chest with arrow pointing upward.  Rub patch adhesive wings for 2 minutes. Remove white label marked "1". Remove the white  label marked "2". Rub patch adhesive wings for 2 additional minutes.  While looking in a mirror, press and release button in center of patch. A small green light will  flash 3-4 times. This will be your only indicator that the monitor has been turned on.  Do not shower for the first 24 hours. You may shower after the first 24 hours.  Press the button if you feel a symptom. You will hear a small click. Record Date, Time and  Symptom in the Patient Logbook.  When you are ready to remove the patch, follow instructions on the last 2 pages of Patient  Logbook. Stick patch monitor onto the last page of Patient Logbook.  Place Patient Logbook in the blue and white box. Use locking tab on box and tape box closed  securely. The blue and white box has prepaid postage on it. Please place it in the mailbox as  soon as possible. Your physician should have your test results approximately 7 days after the  monitor has been mailed back to [facility].  Call [facility] at [phone] if you have questions regarding  your ZIO XT patch monitor. Call them immediately if you see an orange light blinking on your  monitor.  If your monitor falls off in less than 4 days, contact our Monitor department at [phone].  If your monitor becomes loose or falls off after 4 days call [employer] at [phone] for  suggestions on securing your monitor   Important Information About [place]

## 2022-06-02 DIAGNOSIS — I471 Supraventricular tachycardia, unspecified: Secondary | ICD-10-CM | POA: Diagnosis not present

## 2022-06-04 ENCOUNTER — Telehealth: Payer: Self-pay | Admitting: *Deleted

## 2022-06-04 NOTE — Telephone Encounter (Signed)
Patient notified of sleep study results and recommendations. She agrees to proceed with MD recommendations. Order will be sent to [facility].

## 2022-06-04 NOTE — Telephone Encounter (Signed)
-----   Message from Troy Sine, MD sent at 05/27/2022  7:24 AM EDT ----- Plese ,please notify pt of results; can initiate a trial of Auto-PAP

## 2022-07-07 NOTE — Progress Notes (Unsigned)
Cardiology Office Note Date:  07/10/2022  Patient ID:  Kelly, Turner 12-04-1955, MRN [id number] PCP:  Lawerance Cruel, MD  Cardiologist:  Dr. Acie Fredrickson Electrophysiologist: Dr. Quentin Ore    Chief Complaint:  6-8 week f/u  History of Present Illness: [name] is a 66 y.o. female with history of HTN, HLD, SVT, AFib.  She saw Dr. Acie Fredrickson 04/02/22, unfortunately back to smoking triggered by the death of her daughter a year or so prior. Had ER visit with CP, palpitations and diagnosed with AFib in August > Afib clinic. Started on eliquis. Echo 03/22/22: EF 60-65%, indeterminate diastolic function Trivial MR Planned for coronary CTa, and sleep study.  CT with mild, nonobstructive disease Sleep study showed severe sleep apnea during supine sleep  Referred to EP, saw Dr. Quentin Ore 05/30/22, in discussion with her, she had had previous heart racing going to the ER and reported being given adenosine with success. (Going back to her 50's). In his review of her record, only EMS tracings to review with her tachycardia and felt to represent an SVT NOT Afib -> 2wk zio If no AFib to stop Eliquis > ASA 81mg  daily  + sleep study pending auto PAP trial  HR 52 - 182 bpm, average 76 bpm. 2 nonsustained VT episodes, longest 9 beats 2 nonsustained SVT episodes, longest 6 beats. Occasional supraventricular ectopy, 1.6% Frequent ventricular ectopy, 7% No sustained arrhythmias. Symptom triggered episodes correspond to sinus rhythm with PVCs.  Today, patient reports doing well. She has not had any episodes of sustained elevated HR, continues to have palpitations every now and then but they are very manageable. She has not needed any PRN propanolol. Denies CP, SOB, DOE, edema.  She continues to smoke about 1ppd. Husband also smokes, but he has significantly reduce amount to about 4 cigarettes/day. They both desire to quit.  She is diligent about taking eliquis, requests to stop it if possible.     Past Medical History:  Diagnosis Date   Diabetes mellitus without complication ([place])    Hyperlipidemia    Hypertension     Past Surgical History:  Procedure Laterality Date   APPENDECTOMY     CESAREAN SECTION      Current Outpatient Medications  Medication Sig Dispense Refill   ALPRAZolam (XANAX) 1 MG tablet Take 1 mg by mouth 3 (three) times daily as needed.     apixaban (ELIQUIS) 5 MG TABS tablet Take 1 tablet (5 mg total) by mouth 2 (two) times daily. 60 tablet 3   B Complex Vitamins (VITAMIN B COMPLEX) TABS Take 1 tablet by mouth 4 (four) times a week.     chlorthalidone (HYGROTON) 25 MG tablet 1 tablet in the morning     cholecalciferol (VITAMIN D3) 25 MCG (1000 UNIT) tablet Take 1,000 Units by mouth as needed. Vitamin D3/K2     Coenzyme Q10 (COQ10) 50 MG CAPS Take 1 tablet by mouth 4 (four) times a week.     JARDIANCE 10 MG TABS tablet Take 10 mg by mouth daily.     levalbuterol (XOPENEX HFA) 45 MCG/ACT inhaler 1 puff as needed     metaxalone (SKELAXIN) 800 MG tablet Take 800 mg by mouth 3 (three) times daily as needed.     metoprolol succinate (TOPROL-XL) 50 MG 24 hr tablet 1 TABLET ORALLY ONCE A DAY 90 DAYS     Multiple Vitamin (MULTIVITAMIN) tablet Take 1 tablet by mouth 4 (four) times a week. Calcium, Magnesium and Zinc  pravastatin (PRAVACHOL) 40 MG tablet Take 1 tablet by mouth daily.     propranolol (INDERAL) 10 MG tablet Taking 1-2 tablets by mouth as needed for A-fib     traZODone (DESYREL) 50 MG tablet Take 50-150 mg by mouth at bedtime.     Potassium 99 MG TABS Take 2 tablets by mouth in the morning. (Patient not taking: Reported on 07/10/2022)     No current facility-administered medications for this visit.    Allergies:   Diltiazem hcl, Penicillins, Simvastatin, and Wellbutrin [bupropion]   Social History:  The patient  reports that she has been smoking cigarettes. She has never used smokeless tobacco.   Family History:  The patient's family history  includes Breast cancer in her paternal aunt.  ROS:  Please see the history of present illness.    All other systems are reviewed and otherwise negative.   PHYSICAL EXAM:  VS:  BP 138/70 (BP Location: Left Arm, Patient Position: Sitting, Cuff Size: Normal)   Pulse 72   Ht 5\' 3"  (1.6 m)   Wt 166 lb 6 oz (75.5 kg)   LMP 10/26/1998 (Within Years) Comment: ~IN 40s  SpO2 99%   BMI 29.47 kg/m  BMI: Body mass index is 29.47 kg/m. Well nourished, well developed, in no acute distress HEENT: normocephalic, atraumatic Neck: no JVD, carotid bruits or masses Cardiac:  RRR; no significant murmurs, no rubs, or gallops Lungs:  CTA b/l, no wheezing, rhonchi or rales Abd: soft, nontender MS: no deformity or atrophy Ext: no edema Skin: warm and dry, no rash Neuro:  No gross deficits appreciated Psych: euthymic mood, full affect   EKG:  not ordered today   Nov 2023. Monitor HR 52 - 182 bpm, average 76 bpm. 2 nonsustained VT episodes, longest 9 beats 2 nonsustained SVT episodes, longest 6 beats. Occasional supraventricular ectopy, 1.6% Frequent ventricular ectopy, 7% No sustained arrhythmias. Symptom triggered episodes correspond to sinus rhythm with PVCs.  05/02/22: Coronary CT IMPRESSION: 1. Coronary artery calcium score 47 Agatston units. This places the patient in the [place] percentile for age and gender, suggesting intermediate risk for future events. 2.  Mild nonobstructive coronary disease.  Radiology over-read IMPRESSION: Grossly stable 11 x 6 mm right upper lobe nodular density is noted. Follow-up unenhanced chest CT in 6 months is recommended to ensure stability.   03/22/2022: TTE 1. Left ventricular ejection fraction, by estimation, is 60 to 65%. The  left ventricle has normal function. The left ventricle has no regional  wall motion abnormalities. Left ventricular diastolic parameters are  indeterminate.   2. Right ventricular systolic function is normal. The right  ventricular  size is normal.   3. The mitral valve is normal in structure. Trivial mitral valve  regurgitation.   4. The aortic valve is tricuspid. Aortic valve regurgitation is not  visualized.   5. The inferior vena cava is normal in size with greater than 50%  respiratory variability, suggesting right atrial pressure of 3 mmHg.   Recent Labs: 02/28/2022: ALT 19; Hemoglobin 17.2; Magnesium 1.9; Platelets 160; TSH 3.532 04/02/2022: BUN 27; Creatinine, Ser 1.01; Potassium 3.5; Sodium 142  No results found for requested labs within last 365 days.   CrCl cannot be calculated (Patient's most recent lab result is older than the maximum 21 days allowed.).   Wt Readings from Last 3 Encounters:  07/10/22 166 lb 6 oz (75.5 kg)  05/30/22 170 lb (77.1 kg)  04/02/22 172 lb 9.6 oz (78.3 kg)     Other  studies reviewed: Additional studies/records reviewed today include: summarized above  ASSESSMENT AND PLAN:  SVT NSVT Symptom episodes on zio were associated with PVCs No AF; stop eliquis Preserved LVEF No significant CAD She is happy with current level of control with metop xl 50mg  daily Has PRN propranolol, not needed   HTN Well-controlled,   4. OSA Recently diagnosed Recommended she continue to work with sleep med colleagues about moving forward with obtaining equipment  Disposition: F/u with Dr. in 6 months  Current medicines are reviewed at length with the patient today.  The patient did not have any concerns regarding medicines.  SignedLalla Brothers, NP 07/10/22 11:53 AM   [facility] [address] [place] [phone] (office)  [phone] (fax)

## 2022-07-08 ENCOUNTER — Ambulatory Visit: Payer: Federal, State, Local not specified - PPO | Admitting: Cardiovascular Disease

## 2022-07-10 ENCOUNTER — Encounter: Payer: Self-pay | Admitting: Physician Assistant

## 2022-07-10 ENCOUNTER — Ambulatory Visit: Payer: Federal, State, Local not specified - PPO | Attending: Physician Assistant | Admitting: Cardiology

## 2022-07-10 VITALS — BP 138/70 | HR 72 | Ht 63.0 in | Wt 166.4 lb

## 2022-07-10 DIAGNOSIS — I493 Ventricular premature depolarization: Secondary | ICD-10-CM

## 2022-07-10 DIAGNOSIS — I4729 Other ventricular tachycardia: Secondary | ICD-10-CM | POA: Diagnosis not present

## 2022-07-10 DIAGNOSIS — I10 Essential (primary) hypertension: Secondary | ICD-10-CM | POA: Diagnosis not present

## 2022-07-10 DIAGNOSIS — G4733 Obstructive sleep apnea (adult) (pediatric): Secondary | ICD-10-CM

## 2022-07-10 MED ORDER — METOPROLOL SUCCINATE ER 50 MG PO TB24: ORAL_TABLET | ORAL | 3 refills | Status: AC

## 2022-07-10 NOTE — Patient Instructions (Addendum)
Medication Instructions:   START TAKING : ASPIRIN 81 MG ONCE A DAY   STOP TAKING : ELIQUIS   STOP TAKING :  METOPROLOL ( LOPRESSOR).  CONTINUE TAKING METOPROLOL XL EVERY DAY  *If you need a refill on your cardiac medications before your next appointment, please call your pharmacy*   Lab Work: NONE ORDERED  TODAY    If you have labs (blood work) drawn today and your tests are completely normal, you will receive your results only by: MyChart Message (if you have MyChart) OR A paper copy in the mail If you have any lab test that is abnormal or we need to change your treatment, we will call you to review the results.   Testing/Procedures: NONE ORDERED  TODAY     Follow-Up: At [facility], you and your health needs are our priority.  As part of our continuing mission to provide you with exceptional heart care, we have created designated Provider Care Teams.  These Care Teams include your primary Cardiologist (physician) and Advanced Practice Providers (APPs -  Physician Assistants and Nurse Practitioners) who all work together to provide you with the care you need, when you need it.  We recommend signing up for the patient portal called "MyChart".  Sign up information is provided on this After Visit Summary.  MyChart is used to connect with patients for Virtual Visits (Telemedicine).  Patients are able to view lab/test results, encounter notes, upcoming appointments, etc.  Non-urgent messages can be sent to your provider as well.   To learn more about what you can do with MyChart, go to [email or web address].    Your next appointment:   6 month(s)  The format for your next appointment:   In Person  Provider:   You may see Lanier Prude, MD or one of the following Advanced Practice Providers on your designated Care Team:   Francis Dowse, [place] Casimiro Needle "Mardelle Matte" Lanna Poche, [place]    Other Instructions   Important Information About Sugar

## 2022-07-19 ENCOUNTER — Telehealth: Payer: Self-pay | Admitting: *Deleted

## 2022-07-19 NOTE — Telephone Encounter (Signed)
Received a fax from [facility] informing us patient has refused the machine. Order has been voided. Ordering provider will be notified.

## 2022-09-05 ENCOUNTER — Encounter (HOSPITAL_COMMUNITY): Payer: Self-pay | Admitting: *Deleted

## 2022-09-12 DIAGNOSIS — F3342 Major depressive disorder, recurrent, in full remission: Secondary | ICD-10-CM | POA: Diagnosis not present

## 2022-09-12 DIAGNOSIS — F411 Generalized anxiety disorder: Secondary | ICD-10-CM | POA: Diagnosis not present

## 2022-09-12 DIAGNOSIS — G4700 Insomnia, unspecified: Secondary | ICD-10-CM | POA: Diagnosis not present

## 2022-09-18 DIAGNOSIS — E1169 Type 2 diabetes mellitus with other specified complication: Secondary | ICD-10-CM | POA: Diagnosis not present

## 2022-09-18 DIAGNOSIS — R799 Abnormal finding of blood chemistry, unspecified: Secondary | ICD-10-CM | POA: Diagnosis not present

## 2023-03-06 DIAGNOSIS — F3342 Major depressive disorder, recurrent, in full remission: Secondary | ICD-10-CM | POA: Diagnosis not present

## 2023-03-06 DIAGNOSIS — F5101 Primary insomnia: Secondary | ICD-10-CM | POA: Diagnosis not present

## 2023-03-06 DIAGNOSIS — F410 Panic disorder [episodic paroxysmal anxiety] without agoraphobia: Secondary | ICD-10-CM | POA: Diagnosis not present

## 2023-03-28 DIAGNOSIS — E1169 Type 2 diabetes mellitus with other specified complication: Secondary | ICD-10-CM | POA: Diagnosis not present

## 2023-03-28 DIAGNOSIS — E782 Mixed hyperlipidemia: Secondary | ICD-10-CM | POA: Diagnosis not present

## 2023-03-28 DIAGNOSIS — I10 Essential (primary) hypertension: Secondary | ICD-10-CM | POA: Diagnosis not present

## 2023-03-28 DIAGNOSIS — R799 Abnormal finding of blood chemistry, unspecified: Secondary | ICD-10-CM | POA: Diagnosis not present

## 2023-04-02 DIAGNOSIS — E782 Mixed hyperlipidemia: Secondary | ICD-10-CM | POA: Diagnosis not present

## 2023-04-02 DIAGNOSIS — R000 Tachycardia, unspecified: Secondary | ICD-10-CM | POA: Diagnosis not present

## 2023-04-02 DIAGNOSIS — J45909 Unspecified asthma, uncomplicated: Secondary | ICD-10-CM | POA: Diagnosis not present

## 2023-04-02 DIAGNOSIS — I10 Essential (primary) hypertension: Secondary | ICD-10-CM | POA: Diagnosis not present

## 2023-04-02 DIAGNOSIS — E119 Type 2 diabetes mellitus without complications: Secondary | ICD-10-CM | POA: Diagnosis not present

## 2023-04-02 DIAGNOSIS — Z0000 Encounter for general adult medical examination without abnormal findings: Secondary | ICD-10-CM | POA: Diagnosis not present

## 2023-04-02 DIAGNOSIS — E1169 Type 2 diabetes mellitus with other specified complication: Secondary | ICD-10-CM | POA: Diagnosis not present

## 2023-04-02 DIAGNOSIS — Z9181 History of falling: Secondary | ICD-10-CM | POA: Diagnosis not present

## 2023-06-28 ENCOUNTER — Other Ambulatory Visit: Payer: Self-pay

## 2023-06-28 ENCOUNTER — Emergency Department (HOSPITAL_COMMUNITY)
Admission: EM | Admit: 2023-06-28 | Discharge: 2023-06-28 | Disposition: A | Discharge status: Home / Self Care | Payer: Medicare Other | Attending: Emergency Medicine | Admitting: Emergency Medicine

## 2023-06-28 ENCOUNTER — Encounter (HOSPITAL_COMMUNITY): Payer: Self-pay

## 2023-06-28 ENCOUNTER — Emergency Department (HOSPITAL_COMMUNITY): Payer: Medicare Other

## 2023-06-28 DIAGNOSIS — R319 Hematuria, unspecified: Secondary | ICD-10-CM | POA: Diagnosis not present

## 2023-06-28 DIAGNOSIS — Z7982 Long term (current) use of aspirin: Secondary | ICD-10-CM | POA: Insufficient documentation

## 2023-06-28 DIAGNOSIS — I10 Essential (primary) hypertension: Secondary | ICD-10-CM | POA: Insufficient documentation

## 2023-06-28 DIAGNOSIS — Z7984 Long term (current) use of oral hypoglycemic drugs: Secondary | ICD-10-CM | POA: Diagnosis not present

## 2023-06-28 DIAGNOSIS — R1032 Left lower quadrant pain: Secondary | ICD-10-CM | POA: Diagnosis not present

## 2023-06-28 DIAGNOSIS — E119 Type 2 diabetes mellitus without complications: Secondary | ICD-10-CM | POA: Insufficient documentation

## 2023-06-28 DIAGNOSIS — D72829 Elevated white blood cell count, unspecified: Secondary | ICD-10-CM | POA: Diagnosis not present

## 2023-06-28 DIAGNOSIS — Z79899 Other long term (current) drug therapy: Secondary | ICD-10-CM | POA: Insufficient documentation

## 2023-06-28 DIAGNOSIS — R109 Unspecified abdominal pain: Secondary | ICD-10-CM | POA: Diagnosis not present

## 2023-06-28 DIAGNOSIS — D696 Thrombocytopenia, unspecified: Secondary | ICD-10-CM | POA: Insufficient documentation

## 2023-06-28 DIAGNOSIS — K760 Fatty (change of) liver, not elsewhere classified: Secondary | ICD-10-CM | POA: Diagnosis not present

## 2023-06-28 DIAGNOSIS — K4020 Bilateral inguinal hernia, without obstruction or gangrene, not specified as recurrent: Secondary | ICD-10-CM | POA: Diagnosis not present

## 2023-06-28 DIAGNOSIS — K5900 Constipation, unspecified: Secondary | ICD-10-CM | POA: Diagnosis not present

## 2023-06-28 DIAGNOSIS — D751 Secondary polycythemia: Secondary | ICD-10-CM | POA: Diagnosis not present

## 2023-06-28 DIAGNOSIS — K5730 Diverticulosis of large intestine without perforation or abscess without bleeding: Secondary | ICD-10-CM | POA: Insufficient documentation

## 2023-06-28 LAB — COMPREHENSIVE METABOLIC PANEL
ALT: 12 U/L (ref 0–44)
AST: 14 U/L — ABNORMAL LOW (ref 15–41)
Albumin: 4.4 g/dL (ref 3.5–5.0)
Alkaline Phosphatase: 51 U/L (ref 38–126)
Anion gap: 11 (ref 5–15)
BUN: 18 mg/dL (ref 8–23)
CO2: 25 mmol/L (ref 22–32)
Calcium: 9.4 mg/dL (ref 8.9–10.3)
Chloride: 99 mmol/L (ref 98–111)
Creatinine, Ser: 0.87 mg/dL (ref 0.44–1.00)
GFR, Estimated: >60 mL/min (ref >60)
Glucose, Bld: 108 mg/dL — ABNORMAL HIGH (ref 70–99)
Potassium: 3.5 mmol/L (ref 3.5–5.1)
Sodium: 135 mmol/L (ref 135–145)
Total Bilirubin: 0.9 mg/dL (ref <1.2)
Total Protein: 7 g/dL (ref 6.5–8.1)

## 2023-06-28 LAB — URINALYSIS, ROUTINE W REFLEX MICROSCOPIC
Bacteria, UA: NONE SEEN
Bilirubin Urine: NEGATIVE
Glucose, UA: >=500 mg/dL — AB
Ketones, ur: NEGATIVE mg/dL
Nitrite: NEGATIVE
Protein, ur: NEGATIVE mg/dL
Specific Gravity, Urine: 1.003 — ABNORMAL LOW (ref 1.005–1.030)
pH: 6 (ref 5.0–8.0)

## 2023-06-28 LAB — CBC
HCT: 48.4 % — ABNORMAL HIGH (ref 36.0–46.0)
Hemoglobin: 17.2 g/dL — ABNORMAL HIGH (ref 12.0–15.0)
MCH: 32.8 pg (ref 26.0–34.0)
MCHC: 35.5 g/dL (ref 30.0–36.0)
MCV: 92.4 fL (ref 80.0–100.0)
Platelets: 143 10*3/uL — ABNORMAL LOW (ref 150–400)
RBC: 5.24 MIL/uL — ABNORMAL HIGH (ref 3.87–5.11)
RDW: 12.8 % (ref 11.5–15.5)
WBC: 10.9 10*3/uL — ABNORMAL HIGH (ref 4.0–10.5)
nRBC: 0 % (ref 0.0–0.2)

## 2023-06-28 LAB — LIPASE, BLOOD: Lipase: 30 U/L (ref 11–51)

## 2023-06-28 LAB — CBG MONITORING, ED: Glucose-Capillary: 110 mg/dL — ABNORMAL HIGH (ref 70–99)

## 2023-06-28 MED ORDER — DICYCLOMINE HCL 20 MG PO TABS: 20.0000 mg | ORAL_TABLET | Freq: Two times a day (BID) | ORAL | 0 refills | Status: DC | PRN

## 2023-06-28 MED ORDER — ONDANSETRON 4 MG PO TBDP: 4.0000 mg | ORAL_TABLET | Freq: Three times a day (TID) | ORAL | 0 refills | Status: DC | PRN

## 2023-06-28 MED ORDER — ONDANSETRON HCL 4 MG/2ML IJ SOLN
4.0000 mg | Freq: Once | INTRAMUSCULAR | Status: AC
Start: 2023-06-28 — End: 2023-06-28
  Administered 2023-06-28: 4 mg via INTRAVENOUS
  Filled 2023-06-28: qty 2

## 2023-06-28 MED ORDER — DICYCLOMINE HCL 10 MG PO CAPS
10.0000 mg | ORAL_CAPSULE | Freq: Once | ORAL | Status: AC
  Administered 2023-06-28: 10 mg via ORAL
  Filled 2023-06-28: qty 1

## 2023-06-28 MED ORDER — IOHEXOL 300 MG/ML  SOLN
100.0000 mL | Freq: Once | INTRAMUSCULAR | Status: AC | PRN
  Administered 2023-06-28: 100 mL via INTRAVENOUS

## 2023-06-28 MED ORDER — MORPHINE SULFATE (PF) 4 MG/ML IV SOLN
4.0000 mg | Freq: Once | INTRAVENOUS | Status: AC
  Administered 2023-06-28: 4 mg via INTRAVENOUS
  Filled 2023-06-28: qty 1

## 2023-06-28 NOTE — Discharge Instructions (Signed)
You have been seen today for your complaint of abdominal pain. Your lab work was reassuring, however does show blood in your urine. Your imaging showed some constipation but was otherwise reassuring. Your discharge medications include Zofran.  This is a nausea medicine.  Take it as needed in order to eat and drink normal diet. Bentyl.  This is a medicine used to help with your abdominal pain.  Only taking as needed as this may cause significant side effects. MiraLAX.  Take 1 capful twice per day until you have a normal bowel movement and your symptoms improved. Follow up with: Dr. Ewing Schlein.  He is a GI doctor.  Call to schedule an appointment for a follow-up visit if your symptoms do not improve after successful bowel movements. Dr. Arita Miss.  She is a urologist.  Call to schedule a follow-up appointment regarding your blood in your urine. Please seek immediate medical care if you develop any of the following symptoms: You have a fever and your symptoms suddenly get worse. You leak stool or have blood in your stool. Your abdomen is bloated. You have severe pain in your abdomen. You feel dizzy or you faint. At this time there does not appear to be the presence of an emergent medical condition, however there is always the potential for conditions to change. Please read and follow the below instructions.  Do not take your medicine if  develop an itchy rash, swelling in your mouth or lips, or difficulty breathing; call 911 and seek immediate emergency medical attention if this occurs.  You may review your lab tests and imaging results in their entirety on your MyChart account.  Please discuss all results of fully with your primary care provider and other specialist at your follow-up visit.  Note: Portions of this text may have been transcribed using voice recognition software. Every effort was made to ensure accuracy; however, inadvertent computerized transcription errors may still be present.

## 2023-06-28 NOTE — ED Provider Notes (Signed)
[place] EMERGENCY DEPARTMENT AT [facility] Provider Note   CSN: [id number] Arrival date & time: 06/28/23  1130     History  Chief Complaint  Patient presents with   Diarrhea    [name] is a 67 y.o. female.  With history of type 2 diabetes, hypertension, hyperlipidemia presenting to the emergency department for evaluation of abdominal pain.  This began 3 to 4 weeks ago.  She had diarrhea for the first 2 weeks of this abdominal pain.  This is since stopped and her bowel movements are now more normal.  She denies any melena or hematochezia but reports that she has a bleeding hemorrhoid and has had some blood on the toilet paper.  Pain is localized to the left lower quadrant and occasionally radiates to the left low back.  No aggravating or alleviating factors that she is aware of.  It is constant and described as a sharp stabbing sensation.  She denies any fevers or chills, reports some nausea but no vomiting.  No dysuria, frequency or urgency.  No vaginal complaints.  She does report history of diverticulosis found on colonoscopy.   Diarrhea Associated symptoms: abdominal pain        Home Medications Prior to Admission medications   Medication Sig Start Date End Date Taking? Authorizing Provider  dicyclomine (BENTYL) 20 MG tablet Take 1 tablet (20 mg total) by mouth 2 (two) times daily as needed for up to 3 days for spasms. 06/28/23 07/01/23 Yes Merrill Villarruel, Edsel Petrin, PA-C  ondansetron (ZOFRAN-ODT) 4 MG disintegrating tablet Take 1 tablet (4 mg total) by mouth every 8 (eight) hours as needed for nausea or vomiting. 06/28/23  Yes Pharell Rolfson, Edsel Petrin, PA-C  ALPRAZolam Prudy Feeler) 1 MG tablet Take 1 mg by mouth 3 (three) times daily as needed. 02/11/22   [provider]  aspirin EC 81 MG tablet Take 81 mg by mouth daily. Swallow whole.    [provider]  B Complex Vitamins (VITAMIN B COMPLEX) TABS Take 1 tablet by mouth 4 (four) times a week.    [provider]  chlorthalidone (HYGROTON) 25 MG tablet 1 tablet in the morning 09/11/15   [provider]  cholecalciferol (VITAMIN D3) 25 MCG (1000 UNIT) tablet Take 1,000 Units by mouth as needed. Vitamin D3/K2    [provider]  Coenzyme Q10 (COQ10) 50 MG CAPS Take 1 tablet by mouth 4 (four) times a week.    [provider]  JARDIANCE 10 MG TABS tablet Take 10 mg by mouth daily. 02/01/22   [provider]  levalbuterol Pauline Aus HFA) 45 MCG/ACT inhaler 1 puff as needed 06/23/18   [provider]  metaxalone (SKELAXIN) 800 MG tablet Take 800 mg by mouth 3 (three) times daily as needed. 03/03/22   [provider]  metoprolol succinate (TOPROL-XL) 50 MG 24 hr tablet 1 TABLET ORALLY ONCE A DAY 90 DAYS 07/10/22   Sherie Don, NP  Multiple Vitamin (MULTIVITAMIN) tablet Take 1 tablet by mouth 4 (four) times a week. Calcium, Magnesium and Zinc    [provider]  Potassium 99 MG TABS Take 2 tablets by mouth in the morning. Patient not taking: Reported on 07/10/2022    [provider]  pravastatin (PRAVACHOL) 40 MG tablet Take 1 tablet by mouth daily.    [provider]  propranolol (INDERAL) 10 MG tablet Taking 1-2 tablets by mouth as needed for A-fib 09/11/15   [provider]  traZODone (DESYREL) 50 MG tablet  Take 50-150 mg by mouth at bedtime. 02/13/22   [provider]      Allergies    Diltiazem hcl, Penicillins, Simvastatin, and Wellbutrin [bupropion]    Review of Systems   Review of Systems  Gastrointestinal:  Positive for abdominal pain and diarrhea.  All other systems reviewed and are negative.   Physical Exam Updated Vital Signs BP (!) 179/67   Pulse 65   Temp 98.5 F (36.9 C) (Oral)   Resp 18   Ht 5\' 3"  (1.6 m)   Wt 68 kg   LMP 10/26/1998 (Within Years) Comment: ~IN 40s  SpO2 100%   BMI 26.57 kg/m  Physical Exam Vitals and nursing note reviewed.  Constitutional:      General:  She is not in acute distress.    Appearance: She is well-developed.     Comments: Resting comfortably in bed  HENT:     Head: Normocephalic and atraumatic.  Eyes:     Conjunctiva/sclera: Conjunctivae normal.  Cardiovascular:     Rate and Rhythm: Normal rate and regular rhythm.     Heart sounds: No murmur heard. Pulmonary:     Effort: Pulmonary effort is normal. No respiratory distress.     Breath sounds: Normal breath sounds.  Abdominal:     Palpations: Abdomen is soft.     Comments: Minimal tenderness to the left lower quadrant.  Abdomen soft.  No rigidity or guarding  Musculoskeletal:        General: No swelling.     Cervical back: Neck supple.  Skin:    General: Skin is warm and dry.     Capillary Refill: Capillary refill takes less than 2 seconds.  Neurological:     General: No focal deficit present.     Mental Status: She is alert and oriented to person, place, and time.  Psychiatric:        Mood and Affect: Mood normal.     ED Results / Procedures / Treatments   Labs (all labs ordered are listed, but only abnormal results are displayed) Labs Reviewed  COMPREHENSIVE METABOLIC PANEL - Abnormal; Notable for the following components:      Result Value   Glucose, Bld 108 (*)    AST 14 (*)    All other components within normal limits  CBC - Abnormal; Notable for the following components:   WBC 10.9 (*)    RBC 5.24 (*)    Hemoglobin 17.2 (*)    HCT 48.4 (*)    Platelets 143 (*)    All other components within normal limits  URINALYSIS, ROUTINE W REFLEX MICROSCOPIC - Abnormal; Notable for the following components:   Color, Urine STRAW (*)    Specific Gravity, Urine 1.003 (*)    Glucose, UA >=500 (*)    Hgb urine dipstick MODERATE (*)    Leukocytes,Ua TRACE (*)    All other components within normal limits  CBG MONITORING, ED - Abnormal; Notable for the following components:   Glucose-Capillary 110 (*)    All other components within normal limits  LIPASE, BLOOD     EKG None  Radiology CT ABDOMEN PELVIS W CONTRAST  Result Date: 06/28/2023 CLINICAL DATA:  Left lower quadrant abdominal pain. EXAM: CT ABDOMEN AND PELVIS WITH CONTRAST TECHNIQUE: Multidetector CT imaging of the abdomen and pelvis was performed using the standard protocol following bolus administration of intravenous contrast. RADIATION DOSE REDUCTION: This exam was performed according to the departmental dose-optimization program which includes automated exposure control, adjustment of  the mA and/or kV according to patient size and/or use of iterative reconstruction technique. CONTRAST:  OMNIPAQUE IOHEXOL 300 MG/ML  SOLN COMPARISON:  PET-CT scan 10/25/2020. FINDINGS: Lower chest: Mild linear opacity along the lung bases likely scar or atelectasis. No pleural effusion Hepatobiliary: Fatty liver infiltration. Patent portal vein. Gallbladder is nondilated. Pancreas: Unremarkable. No pancreatic ductal dilatation or surrounding inflammatory changes. Spleen: Normal in size without focal abnormality. Adrenals/Urinary Tract: Stable nodular thickening of the adrenal glands. 9 mm low-attenuation lesion in the right kidney on series 12, image 11, too small to completely characterize but likely a benign cystic lesion, Bosniak 2. No specific imaging follow-up. No enhancing renal mass. Preserved contours of the urinary bladder. Stomach/Bowel: Diffuse colonic stool. Large bowel has a normal course and caliber. Few sigmoid colon diverticula. Stomach is underdistended. There are some small bowel loops in the midabdomen which are mildly distended up to 2.7 cm. There is some small bowel stool appearance and some fluid in the bowel in this location, nonspecific. Appendix not well seen in the right lower quadrant but no pericecal stranding or fluid. Vascular/Lymphatic: Aortic atherosclerosis. No enlarged abdominal or pelvic lymph nodes. Circumaortic left renal vein. Reproductive: Uterus and bilateral adnexa are  unremarkable. Other: No free air or free fluid. Small bilateral fat containing inguinal hernias Musculoskeletal: Mild degenerative changes along the spine. IMPRESSION: Fatty liver infiltration. No bowel obstruction, free air or free fluid. Scattered stool. Few colonic diverticula. Few small bowel loops distally are mildly distended with debris and fluid. Nonspecific Electronically Signed   By: Karen Kays M.D.   On: 06/28/2023 13:13    Procedures Procedures    Medications Ordered in ED Medications  ondansetron (ZOFRAN) injection 4 mg (4 mg Intravenous Given 06/28/23 1227)  morphine (PF) 4 MG/ML injection 4 mg (4 mg Intravenous Given 06/28/23 1226)  iohexol (OMNIPAQUE) 300 MG/ML solution 100 mL (100 mLs Intravenous Contrast Given 06/28/23 1249)  dicyclomine (BENTYL) capsule 10 mg (10 mg Oral Given 06/28/23 1339)    ED Course/ Medical Decision Making/ A&P                                 Medical Decision Making Amount and/or Complexity of Data Reviewed Labs: ordered.  This patient presents to the ED for concern of left lower quadrant abdominal pain, this involves an extensive number of treatment options, and is a complaint that carries with it a high risk of complications and morbidity. Differential diagnosis of her lower abdominal considerations include pelvic inflammatory disease, ectopic pregnancy, appendicitis, urinary calculi, primary dysmenorrhea, septic abortion, ruptured ovarian cyst or tumor, ovarian torsion, tubo-ovarian abscess, degeneration of fibroid, endometriosis, diverticulitis, cystitis.   My initial workup includes labs, imaging, symptom control  Additional history obtained from: Nursing notes from this visit. Daughter at bedside provides a portion of the history  I ordered, reviewed and interpreted labs which include: CBC, CMP, lipase, urinalysis.  Leukocytosis of 10.9.  Polycythemia with a hemoglobin of 17.2.  Mild thrombocytopenia with platelets of 143.  I ordered  imaging studies including CT abdomen pelvis I independently visualized and interpreted imaging which showed diverticulosis without diverticulitis.  No free air or fluid.  Moderate diffuse colonic stool burden I agree with the radiologist interpretation  Afebrile, hypertensive but otherwise hemodynamically stable.  67 year old female presenting for evaluation of left lower abdominal pain for the past 4 weeks.  Initially had some diarrhea, however has since progressed to having some  constipation.  She denies any fevers or chills.  No urinary complaints.  No vaginal complaints.  Nausea but no vomiting.  She appears well on physical exam.  Minimal left lower quadrant abdominal tenderness.  No rebounding, guarding or rigidity.  Lab workup as stated above.  CT abdomen pelvis with evidence of constipation.  No acute emergent intra-abdominal abnormalities.  Suspect constipation is the cause of her symptoms.  She was encouraged to take a laxative in order to have normal bowel movements.  She sent a short course of Bentyl for her symptoms and a prescription for Zofran.  She was given contact information for GI and encouraged to follow-up if her symptoms do not improve in 2 days.  She was given contact information for urology to follow-up regarding her hematuria.  She was given return precautions.  Stable at discharge.  At this time there does not appear to be any evidence of an acute emergency medical condition and the patient appears stable for discharge with appropriate outpatient follow up. Diagnosis was discussed with patient who verbalizes understanding of care plan and is agreeable to discharge. I have discussed return precautions with patient and daughter who verbalizes understanding. Patient encouraged to follow-up with their PCP within 1 week. All questions answered.  Patient's case discussed with Dr. Wallace Cullens who agrees with plan to discharge with follow-up.   Note: Portions of this report may have been  transcribed using voice recognition software. Every effort was made to ensure accuracy; however, inadvertent computerized transcription errors may still be present.        Final Clinical Impression(s) / ED Diagnoses Final diagnoses:  Left lower quadrant abdominal pain  Hematuria, unspecified type  Constipation, unspecified constipation type    Rx / DC Orders ED Discharge Orders          Ordered    ondansetron (ZOFRAN-ODT) 4 MG disintegrating tablet  Every 8 hours PRN        06/28/23 1416    dicyclomine (BENTYL) 20 MG tablet  2 times daily PRN        06/28/23 1416              Michelle Piper, PA-C 06/28/23 1420    Franne Forts, DO 07/04/23 2353

## 2023-06-28 NOTE — ED Triage Notes (Signed)
Patient reports left flank pain x 3-4 weeks. Diarrhea and nausea x 2 weeks. Patient is diabetic, BGL 110 on arrival.

## 2023-07-07 ENCOUNTER — Observation Stay (HOSPITAL_COMMUNITY)
Admission: EM | Admit: 2023-07-07 | Discharge: 2023-07-08 | Disposition: A | Discharge status: Home / Self Care | Payer: Medicare Other | Attending: Family Medicine | Admitting: Family Medicine

## 2023-07-07 ENCOUNTER — Other Ambulatory Visit: Payer: Self-pay

## 2023-07-07 ENCOUNTER — Emergency Department (HOSPITAL_COMMUNITY): Payer: Medicare Other

## 2023-07-07 ENCOUNTER — Observation Stay (HOSPITAL_COMMUNITY): Payer: Medicare Other

## 2023-07-07 DIAGNOSIS — F17210 Nicotine dependence, cigarettes, uncomplicated: Secondary | ICD-10-CM | POA: Insufficient documentation

## 2023-07-07 DIAGNOSIS — I10 Essential (primary) hypertension: Secondary | ICD-10-CM | POA: Insufficient documentation

## 2023-07-07 DIAGNOSIS — K760 Fatty (change of) liver, not elsewhere classified: Secondary | ICD-10-CM | POA: Diagnosis not present

## 2023-07-07 DIAGNOSIS — R9389 Abnormal findings on diagnostic imaging of other specified body structures: Secondary | ICD-10-CM | POA: Diagnosis not present

## 2023-07-07 DIAGNOSIS — Z79899 Other long term (current) drug therapy: Secondary | ICD-10-CM | POA: Insufficient documentation

## 2023-07-07 DIAGNOSIS — K648 Other hemorrhoids: Secondary | ICD-10-CM | POA: Insufficient documentation

## 2023-07-07 DIAGNOSIS — K644 Residual hemorrhoidal skin tags: Secondary | ICD-10-CM

## 2023-07-07 DIAGNOSIS — R1032 Left lower quadrant pain: Secondary | ICD-10-CM | POA: Diagnosis not present

## 2023-07-07 DIAGNOSIS — R1084 Generalized abdominal pain: Secondary | ICD-10-CM | POA: Diagnosis not present

## 2023-07-07 DIAGNOSIS — N854 Malposition of uterus: Secondary | ICD-10-CM | POA: Diagnosis not present

## 2023-07-07 DIAGNOSIS — K5730 Diverticulosis of large intestine without perforation or abscess without bleeding: Secondary | ICD-10-CM | POA: Diagnosis not present

## 2023-07-07 DIAGNOSIS — K922 Gastrointestinal hemorrhage, unspecified: Secondary | ICD-10-CM | POA: Diagnosis not present

## 2023-07-07 DIAGNOSIS — E119 Type 2 diabetes mellitus without complications: Secondary | ICD-10-CM | POA: Insufficient documentation

## 2023-07-07 DIAGNOSIS — D259 Leiomyoma of uterus, unspecified: Secondary | ICD-10-CM | POA: Diagnosis not present

## 2023-07-07 DIAGNOSIS — R109 Unspecified abdominal pain: Secondary | ICD-10-CM | POA: Diagnosis present

## 2023-07-07 DIAGNOSIS — B029 Zoster without complications: Secondary | ICD-10-CM | POA: Insufficient documentation

## 2023-07-07 DIAGNOSIS — Z7982 Long term (current) use of aspirin: Secondary | ICD-10-CM | POA: Insufficient documentation

## 2023-07-07 LAB — CBC
HCT: 45.2 % (ref 36.0–46.0)
Hemoglobin: 15.8 g/dL — ABNORMAL HIGH (ref 12.0–15.0)
MCH: 33.1 pg (ref 26.0–34.0)
MCHC: 35 g/dL (ref 30.0–36.0)
MCV: 94.6 fL (ref 80.0–100.0)
Platelets: 129 10*3/uL — ABNORMAL LOW (ref 150–400)
RBC: 4.78 MIL/uL (ref 3.87–5.11)
RDW: 12.9 % (ref 11.5–15.5)
WBC: 9.9 10*3/uL (ref 4.0–10.5)
nRBC: 0 % (ref 0.0–0.2)

## 2023-07-07 LAB — URINALYSIS, ROUTINE W REFLEX MICROSCOPIC
Bilirubin Urine: NEGATIVE
Glucose, UA: >=500 mg/dL — AB
Ketones, ur: NEGATIVE mg/dL
Leukocytes,Ua: NEGATIVE
Nitrite: NEGATIVE
Protein, ur: NEGATIVE mg/dL
Specific Gravity, Urine: 1.016 (ref 1.005–1.030)
pH: 5 (ref 5.0–8.0)

## 2023-07-07 LAB — COMPREHENSIVE METABOLIC PANEL
ALT: 10 U/L (ref 0–44)
AST: 12 U/L — ABNORMAL LOW (ref 15–41)
Albumin: 3.6 g/dL (ref 3.5–5.0)
Alkaline Phosphatase: 48 U/L (ref 38–126)
Anion gap: 8 (ref 5–15)
BUN: 17 mg/dL (ref 8–23)
CO2: 25 mmol/L (ref 22–32)
Calcium: 8.4 mg/dL — ABNORMAL LOW (ref 8.9–10.3)
Chloride: 106 mmol/L (ref 98–111)
Creatinine, Ser: 0.78 mg/dL (ref 0.44–1.00)
GFR, Estimated: >60 mL/min (ref >60)
Glucose, Bld: 99 mg/dL (ref 70–99)
Potassium: 3.5 mmol/L (ref 3.5–5.1)
Sodium: 139 mmol/L (ref 135–145)
Total Bilirubin: 0.7 mg/dL (ref <1.2)
Total Protein: 5.9 g/dL — ABNORMAL LOW (ref 6.5–8.1)

## 2023-07-07 LAB — POC OCCULT BLOOD, ED: Fecal Occult Bld: POSITIVE — AB

## 2023-07-07 LAB — GLUCOSE, CAPILLARY
Glucose-Capillary: 128 mg/dL — ABNORMAL HIGH (ref 70–99)
Glucose-Capillary: 146 mg/dL — ABNORMAL HIGH (ref 70–99)

## 2023-07-07 LAB — HEMOGLOBIN A1C
Hgb A1c MFr Bld: 5.7 % — ABNORMAL HIGH (ref 4.8–5.6)
Mean Plasma Glucose: 116.89 mg/dL

## 2023-07-07 LAB — LIPASE, BLOOD: Lipase: 36 U/L (ref 11–51)

## 2023-07-07 MED ORDER — ENOXAPARIN SODIUM 40 MG/0.4ML IJ SOSY
40.0000 mg | PREFILLED_SYRINGE | INTRAMUSCULAR | Status: DC
  Administered 2023-07-07: 40 mg via SUBCUTANEOUS
  Filled 2023-07-07: qty 0.4

## 2023-07-07 MED ORDER — ONDANSETRON HCL 4 MG/2ML IJ SOLN
4.0000 mg | Freq: Four times a day (QID) | INTRAMUSCULAR | Status: DC | PRN
  Administered 2023-07-08: 4 mg via INTRAVENOUS
  Filled 2023-07-07: qty 2

## 2023-07-07 MED ORDER — INSULIN ASPART 100 UNIT/ML IJ SOLN
0.0000 [IU] | Freq: Three times a day (TID) | INTRAMUSCULAR | Status: DC
  Administered 2023-07-08: 2 [IU] via SUBCUTANEOUS
  Filled 2023-07-07: qty 0.15

## 2023-07-07 MED ORDER — IRBESARTAN 150 MG PO TABS
150.0000 mg | ORAL_TABLET | Freq: Every day | ORAL | Status: DC
  Administered 2023-07-07 – 2023-07-08 (×2): 150 mg via ORAL
  Filled 2023-07-07 (×3): qty 1

## 2023-07-07 MED ORDER — INSULIN ASPART 100 UNIT/ML IJ SOLN
0.0000 [IU] | Freq: Every day | INTRAMUSCULAR | Status: DC
  Filled 2023-07-07: qty 0.05

## 2023-07-07 MED ORDER — HYDROMORPHONE HCL 1 MG/ML IJ SOLN
1.0000 mg | Freq: Once | INTRAMUSCULAR | Status: AC
  Administered 2023-07-07: 1 mg via INTRAVENOUS
  Filled 2023-07-07: qty 1

## 2023-07-07 MED ORDER — SODIUM CHLORIDE 0.9 % IV BOLUS
1000.0000 mL | Freq: Once | INTRAVENOUS | Status: AC
  Administered 2023-07-07: 1000 mL via INTRAVENOUS

## 2023-07-07 MED ORDER — IOHEXOL 300 MG/ML  SOLN
100.0000 mL | Freq: Once | INTRAMUSCULAR | Status: AC | PRN
  Administered 2023-07-07: 100 mL via INTRAVENOUS

## 2023-07-07 MED ORDER — PRAVASTATIN SODIUM 20 MG PO TABS
40.0000 mg | ORAL_TABLET | Freq: Every day | ORAL | Status: DC
  Administered 2023-07-07: 40 mg via ORAL
  Filled 2023-07-07: qty 2

## 2023-07-07 MED ORDER — METOPROLOL SUCCINATE ER 50 MG PO TB24
50.0000 mg | ORAL_TABLET | Freq: Every day | ORAL | Status: DC
  Administered 2023-07-08: 50 mg via ORAL
  Filled 2023-07-07: qty 1

## 2023-07-07 MED ORDER — ACETAMINOPHEN 325 MG PO TABS: 650.0000 mg | ORAL_TABLET | Freq: Four times a day (QID) | ORAL | Status: DC | PRN

## 2023-07-07 MED ORDER — FENTANYL CITRATE PF 50 MCG/ML IJ SOSY
50.0000 ug | PREFILLED_SYRINGE | Freq: Once | INTRAMUSCULAR | Status: AC
  Administered 2023-07-07: 50 ug via INTRAVENOUS
  Filled 2023-07-07: qty 1

## 2023-07-07 MED ORDER — ALBUTEROL SULFATE (2.5 MG/3ML) 0.083% IN NEBU: 2.5000 mg | INHALATION_SOLUTION | RESPIRATORY_TRACT | Status: DC | PRN

## 2023-07-07 MED ORDER — ONDANSETRON HCL 4 MG PO TABS: 4.0000 mg | ORAL_TABLET | Freq: Four times a day (QID) | ORAL | Status: DC | PRN

## 2023-07-07 MED ORDER — HYDROMORPHONE HCL 1 MG/ML IJ SOLN
0.5000 mg | INTRAMUSCULAR | Status: DC | PRN
  Administered 2023-07-07 – 2023-07-08 (×4): 1 mg via INTRAVENOUS
  Filled 2023-07-07 (×4): qty 1

## 2023-07-07 MED ORDER — ASPIRIN 81 MG PO TBEC
81.0000 mg | DELAYED_RELEASE_TABLET | Freq: Every day | ORAL | Status: DC
  Administered 2023-07-07 – 2023-07-08 (×2): 81 mg via ORAL
  Filled 2023-07-07 (×2): qty 1

## 2023-07-07 MED ORDER — OXYCODONE HCL 5 MG PO TABS
5.0000 mg | ORAL_TABLET | ORAL | Status: DC | PRN
Start: 2023-07-07 — End: 2023-07-08
  Administered 2023-07-07: 5 mg via ORAL
  Filled 2023-07-07 (×2): qty 1

## 2023-07-07 MED ORDER — ACETAMINOPHEN 650 MG RE SUPP: 650.0000 mg | Freq: Four times a day (QID) | RECTAL | Status: DC | PRN

## 2023-07-07 MED ORDER — ALPRAZOLAM 0.5 MG PO TABS: 1.0000 mg | ORAL_TABLET | Freq: Three times a day (TID) | ORAL | Status: DC | PRN

## 2023-07-07 MED ORDER — TRAZODONE HCL 50 MG PO TABS
25.0000 mg | ORAL_TABLET | Freq: Every evening | ORAL | Status: DC | PRN
  Administered 2023-07-07: 25 mg via ORAL
  Filled 2023-07-07: qty 1

## 2023-07-07 NOTE — ED Provider Notes (Signed)
[place] EMERGENCY DEPARTMENT AT [facility] Provider Note   CSN: [id number] Arrival date & time: 07/07/23  1057     History  Chief Complaint  Patient presents with   Abdominal Pain   Constipation    [name] is a 67 y.o. female.   Abdominal Pain Associated symptoms: no nausea and no vomiting   Constipation Associated symptoms: abdominal pain   Associated symptoms: no nausea and no vomiting      67 year old female with medical history significant for diabetes mellitus, hypertension, hyperlipidemia presenting to the emergency department with abdominal pain.  The patient was seen in the emergency department on 06/28/2023 during which time she underwent CT imaging and was told she was constipated.  She has been trying MiraLAX at home and has had only worsening abdominal pain.  She endorses 8 out of 10 pain diffusely throughout her abdomen.  She is moving her bowels only small amounts.  She denies any fevers or chills, denies any nausea or vomiting.  She has had episodes of bright red blood per rectum which she attributes to her known hemorrhoids.  She denies any genitourinary symptoms.  Home Medications Prior to Admission medications   Medication Sig Start Date End Date Taking? Authorizing Provider  irbesartan (AVAPRO) 150 MG tablet Take 150 mg by mouth daily. 05/12/23  Yes [provider]  sertraline (ZOLOFT) 100 MG tablet Take 100 mg by mouth daily. 06/14/23  Yes [provider]  ALPRAZolam Prudy Feeler) 1 MG tablet Take 1 mg by mouth 3 (three) times daily as needed. 02/11/22   [provider]  aspirin EC 81 MG tablet Take 81 mg by mouth daily. Swallow whole.    [provider]  B Complex Vitamins (VITAMIN B COMPLEX) TABS Take 1 tablet by mouth 4 (four) times a week.    [provider]  chlorthalidone (HYGROTON) 25 MG tablet Take 25 mg by mouth daily. 09/11/15   [provider]  cholecalciferol (VITAMIN D3) 25 MCG (1000  UNIT) tablet Take 1,000 Units by mouth as needed. Vitamin D3/K2    [provider]  Coenzyme Q10 (COQ10) 50 MG CAPS Take 1 tablet by mouth 4 (four) times a week.    [provider]  dicyclomine (BENTYL) 20 MG tablet Take 1 tablet (20 mg total) by mouth 2 (two) times daily as needed for up to 3 days for spasms. 06/28/23 07/07/23  Schutt, Edsel Petrin, PA-C  JARDIANCE 10 MG TABS tablet Take 10 mg by mouth daily. 02/01/22   [provider]  levalbuterol (XOPENEX HFA) 45 MCG/ACT inhaler Inhale 1 puff into the lungs every 8 (eight) hours as needed for wheezing or shortness of breath. 06/23/18   [provider]  metaxalone (SKELAXIN) 800 MG tablet Take 800 mg by mouth 3 (three) times daily as needed. 03/03/22   [provider]  metoprolol succinate (TOPROL-XL) 50 MG 24 hr tablet 1 TABLET ORALLY ONCE A DAY 90 DAYS 07/10/22   Sherie Don, NP  Multiple Vitamin (MULTIVITAMIN) tablet Take 1 tablet by mouth 4 (four) times a week. Calcium, Magnesium and Zinc    [provider]  ondansetron (ZOFRAN-ODT) 4 MG disintegrating tablet Take 1 tablet (4 mg total) by mouth every 8 (eight) hours as needed for nausea or vomiting. 06/28/23   Schutt, Edsel Petrin, PA-C  Potassium 99 MG TABS Take 2 tablets by mouth in the morning. Patient not taking: Reported on 07/10/2022    [provider]  pravastatin (PRAVACHOL) 40 MG  tablet Take 1 tablet by mouth daily.    [provider]  propranolol (INDERAL) 10 MG tablet Take 10-20 mg by mouth daily as needed. 09/11/15   [provider]  traZODone (DESYREL) 50 MG tablet Take 50-150 mg by mouth at bedtime. 02/13/22   [provider]      Allergies    Diltiazem hcl, Penicillins, Simvastatin, and Wellbutrin [bupropion]    Review of Systems   Review of Systems  Gastrointestinal:  Positive for abdominal pain and anal bleeding. Negative for nausea and vomiting.  All other systems reviewed and are  negative.   Physical Exam Updated Vital Signs BP (!) 202/90   Pulse 78   Temp 98 F (36.7 C) (Oral)   Resp 18   Ht 5\' 3"  (1.6 m)   Wt 65.8 kg   LMP 10/26/1998 (Within Years) Comment: ~IN 40s  SpO2 99%   BMI 25.69 kg/m  Physical Exam Vitals and nursing note reviewed. Exam conducted with a chaperone present.  Constitutional:      General: She is not in acute distress.    Appearance: She is well-developed.  HENT:     Head: Normocephalic and atraumatic.  Eyes:     Conjunctiva/sclera: Conjunctivae normal.  Cardiovascular:     Rate and Rhythm: Normal rate and regular rhythm.     Heart sounds: No murmur heard. Pulmonary:     Effort: Pulmonary effort is normal. No respiratory distress.     Breath sounds: Normal breath sounds.  Abdominal:     Palpations: Abdomen is soft.     Tenderness: There is generalized abdominal tenderness. There is guarding. There is no rebound.  Genitourinary:    Comments: External hemorrhoid present, nonthrombosed and not actively bleeding, guaiac positive Musculoskeletal:        General: No swelling.     Cervical back: Neck supple.  Skin:    General: Skin is warm and dry.     Capillary Refill: Capillary refill takes less than 2 seconds.  Neurological:     Mental Status: She is alert.  Psychiatric:        Mood and Affect: Mood normal.     ED Results / Procedures / Treatments   Labs (all labs ordered are listed, but only abnormal results are displayed) Labs Reviewed  COMPREHENSIVE METABOLIC PANEL - Abnormal; Notable for the following components:      Result Value   Calcium 8.4 (*)    Total Protein 5.9 (*)    AST 12 (*)    All other components within normal limits  CBC - Abnormal; Notable for the following components:   Hemoglobin 15.8 (*)    Platelets 129 (*)    All other components within normal limits  URINALYSIS, ROUTINE W REFLEX MICROSCOPIC - Abnormal; Notable for the following components:   Glucose, UA >=500 (*)    Hgb urine  dipstick MODERATE (*)    Bacteria, UA RARE (*)    All other components within normal limits  POC OCCULT BLOOD, ED - Abnormal; Notable for the following components:   Fecal Occult Bld POSITIVE (*)    All other components within normal limits  LIPASE, BLOOD    EKG None  Radiology CT ABDOMEN PELVIS W CONTRAST  Result Date: 07/07/2023 CLINICAL DATA:  Acute non localized abdominal pain. EXAM: CT ABDOMEN AND PELVIS WITH CONTRAST TECHNIQUE: Multidetector CT imaging of the abdomen and pelvis was performed using the standard protocol following bolus administration of intravenous contrast. RADIATION DOSE REDUCTION: This exam  was performed according to the departmental dose-optimization program which includes automated exposure control, adjustment of the mA and/or kV according to patient size and/or use of iterative reconstruction technique. CONTRAST:  OMNIPAQUE IOHEXOL 300 MG/ML  SOLN COMPARISON:  06/28/2023 FINDINGS: Lower Chest: No acute findings. Hepatobiliary: No suspicious hepatic masses identified. Mild diffuse hepatic steatosis noted. Gallbladder is unremarkable. No evidence of biliary ductal dilatation. Pancreas:  No mass or inflammatory changes. Spleen: Within normal limits in size and appearance. Adrenals/Urinary Tract: No suspicious masses identified. No evidence of ureteral calculi or hydronephrosis. Stomach/Bowel: No evidence of obstruction, inflammatory process or abnormal fluid collections. Diverticulosis is seen mainly involving the sigmoid colon, however there is no evidence of diverticulitis. Vascular/Lymphatic: No pathologically enlarged lymph nodes. No acute vascular findings. Reproductive: 1 cm right-sided uterine fibroid noted. Adnexal regions are unremarkable. Other:  None. Musculoskeletal:  No suspicious bone lesions identified. IMPRESSION: No acute findings. Colonic diverticulosis, without radiographic evidence of diverticulitis. Mild hepatic steatosis. Tiny uterine fibroid.  Electronically Signed   By: Danae Orleans M.D.   On: 07/07/2023 15:21    Procedures Procedures    Medications Ordered in ED Medications  fentaNYL (SUBLIMAZE) injection 50 mcg (50 mcg Intravenous Given 07/07/23 1146)  sodium chloride 0.9 % bolus 1,000 mL (1,000 mLs Intravenous New Bag/Given 07/07/23 1147)  fentaNYL (SUBLIMAZE) injection 50 mcg (50 mcg Intravenous Given 07/07/23 1258)  iohexol (OMNIPAQUE) 300 MG/ML solution 100 mL (100 mLs Intravenous Contrast Given 07/07/23 1345)  fentaNYL (SUBLIMAZE) injection 50 mcg (50 mcg Intravenous Given 07/07/23 1513)  HYDROmorphone (DILAUDID) injection 1 mg (1 mg Intravenous Given 07/07/23 1616)    ED Course/ Medical Decision Making/ A&P Clinical Course as of 07/07/23 1629  Mon Jul 07, 2023  1621 Fecal Occult Blood, POC(!): POSITIVE [JL]    Clinical Course User Index [JL] Ernie Avena, MD                                 Medical Decision Making Amount and/or Complexity of Data Reviewed Labs: ordered. Decision-making details documented in ED Course. Radiology: ordered.  Risk Prescription drug management. Decision regarding hospitalization.    67 year old female with medical history significant for diabetes mellitus, hypertension, hyperlipidemia presenting to the emergency department with abdominal pain.  The patient was seen in the emergency department on 06/28/2023 during which time she underwent CT imaging and was told she was constipated.  She has been trying MiraLAX at home and has had only worsening abdominal pain.  She endorses 8 out of 10 pain diffusely throughout her abdomen.  She is moving her bowels only small amounts.  She denies any fevers or chills, denies any nausea or vomiting.  She has had episodes of bright red blood per rectum which she attributes to her known hemorrhoids.  She denies any genitourinary symptoms.  On arrival, the patient was afebrile, not tachycardic or tachypneic, hemodynamically stable, BP 134/66, saturating  94% on room air.  Physical exam revealed an abdomen that was mildly tender to palpation, no melena or hematochezia, external hemorrhoid present, not actively bleeding and nonthrombosed, fecal occult positive.  Initial Assessment:   With the patient's presentation of abdominal pain, most likely diagnosis is constipation, colitis, diverticulitis. Other diagnoses were considered including (but not limited to) gastroenteritis, small bowel obstruction, appendicitis, cholecystitis, pancreatitis, nephrolithiasis, UTI, pyelonephritis. These are considered less likely due to history of present illness and physical exam findings.      Initial Plan:  CBC/CMP to evaluate for underlying infectious/metabolic etiology for patient's abdominal pain  Lipase to evaluate for pancreatitis  EKG to evaluate for cardiac source of pain  CTAB/Pelvis with contrast to evaluate for structural/surgical etiology of patients' severe abdominal pain.  Urinalysis and repeat physical assessment to evaluate for UTI/Pyelonpehritis  Empiric management of symptoms with escalating pain control and antiemetics as needed.   Initial Study Results:   Laboratory  All laboratory results reviewed without evidence of clinically relevant pathology.   Exceptions include: Guaiac positive stool, 3 bacteria in the urinalysis without clear evidence of UTI, moderate hemoglobin present    Radiology  CT ABDOMEN PELVIS W CONTRAST  Result Date: 07/07/2023 CLINICAL DATA:  Acute non localized abdominal pain. EXAM: CT ABDOMEN AND PELVIS WITH CONTRAST TECHNIQUE: Multidetector CT imaging of the abdomen and pelvis was performed using the standard protocol following bolus administration of intravenous contrast. RADIATION DOSE REDUCTION: This exam was performed according to the departmental dose-optimization program which includes automated exposure control, adjustment of the mA and/or kV according to patient size and/or use of iterative reconstruction  technique. CONTRAST:  OMNIPAQUE IOHEXOL 300 MG/ML  SOLN COMPARISON:  06/28/2023 FINDINGS: Lower Chest: No acute findings. Hepatobiliary: No suspicious hepatic masses identified. Mild diffuse hepatic steatosis noted. Gallbladder is unremarkable. No evidence of biliary ductal dilatation. Pancreas:  No mass or inflammatory changes. Spleen: Within normal limits in size and appearance. Adrenals/Urinary Tract: No suspicious masses identified. No evidence of ureteral calculi or hydronephrosis. Stomach/Bowel: No evidence of obstruction, inflammatory process or abnormal fluid collections. Diverticulosis is seen mainly involving the sigmoid colon, however there is no evidence of diverticulitis. Vascular/Lymphatic: No pathologically enlarged lymph nodes. No acute vascular findings. Reproductive: 1 cm right-sided uterine fibroid noted. Adnexal regions are unremarkable. Other:  None. Musculoskeletal:  No suspicious bone lesions identified. IMPRESSION: No acute findings. Colonic diverticulosis, without radiographic evidence of diverticulitis. Mild hepatic steatosis. Tiny uterine fibroid. Electronically Signed   By: Danae Orleans M.D.   On: 07/07/2023 15:21   CT ABDOMEN PELVIS W CONTRAST  Result Date: 06/28/2023 CLINICAL DATA:  Left lower quadrant abdominal pain. EXAM: CT ABDOMEN AND PELVIS WITH CONTRAST TECHNIQUE: Multidetector CT imaging of the abdomen and pelvis was performed using the standard protocol following bolus administration of intravenous contrast. RADIATION DOSE REDUCTION: This exam was performed according to the departmental dose-optimization program which includes automated exposure control, adjustment of the mA and/or kV according to patient size and/or use of iterative reconstruction technique. CONTRAST:  OMNIPAQUE IOHEXOL 300 MG/ML  SOLN COMPARISON:  PET-CT scan 10/25/2020. FINDINGS: Lower chest: Mild linear opacity along the lung bases likely scar or atelectasis. No pleural effusion  Hepatobiliary: Fatty liver infiltration. Patent portal vein. Gallbladder is nondilated. Pancreas: Unremarkable. No pancreatic ductal dilatation or surrounding inflammatory changes. Spleen: Normal in size without focal abnormality. Adrenals/Urinary Tract: Stable nodular thickening of the adrenal glands. 9 mm low-attenuation lesion in the right kidney on series 12, image 11, too small to completely characterize but likely a benign cystic lesion, Bosniak 2. No specific imaging follow-up. No enhancing renal mass. Preserved contours of the urinary bladder. Stomach/Bowel: Diffuse colonic stool. Large bowel has a normal course and caliber. Few sigmoid colon diverticula. Stomach is underdistended. There are some small bowel loops in the midabdomen which are mildly distended up to 2.7 cm. There is some small bowel stool appearance and some fluid in the bowel in this location, nonspecific. Appendix not well seen in the right lower quadrant but no pericecal stranding or fluid. Vascular/Lymphatic: Aortic  atherosclerosis. No enlarged abdominal or pelvic lymph nodes. Circumaortic left renal vein. Reproductive: Uterus and bilateral adnexa are unremarkable. Other: No free air or free fluid. Small bilateral fat containing inguinal hernias Musculoskeletal: Mild degenerative changes along the spine. IMPRESSION: Fatty liver infiltration. No bowel obstruction, free air or free fluid. Scattered stool. Few colonic diverticula. Few small bowel loops distally are mildly distended with debris and fluid. Nonspecific Electronically Signed   By: Karen Kays M.D.   On: 06/28/2023 13:13       Final Reassessment and Plan:   Patient required escalating doses of opiates in the emergency department, received 150 mcg of fentanyl and subsequently required 1 mg of IV Dilaudid.  She continued to endorse severe abdominal pain.  She remains hemodynamically stable.  Given her intractable pain, I recommended admission for further pain control and  further management.  Hospitalist medicine consulted for admission, Dr. Erenest Blank accepting.   Final Clinical Impression(s) / ED Diagnoses Final diagnoses:  Generalized abdominal pain  Lower GI bleed  External hemorrhoid    Rx / DC Orders ED Discharge Orders     None         Ernie Avena, MD 07/07/23 [phone]

## 2023-07-07 NOTE — ED Triage Notes (Signed)
Pt arrived via POV. C/o LLQ abd pain for several weeks. Was seen recently for same. Has been taking miralax daily w/no relief.    AOx4

## 2023-07-07 NOTE — ED Notes (Signed)
ED TO INPATIENT HANDOFF REPORT  Name/Age/Gender [name] 67 y.o. female  Code Status    Code Status Orders  (From admission, onward)           Start     Ordered   07/07/23 1642  Full code  Continuous       Question:  By:  Answer:  Consent: discussion documented in EHR   07/07/23 1642           Code Status History     This patient has a current code status but no historical code status.       Home/SNF/Other Home  Chief Complaint Abdominal pain [R10.9]  Level of Care/Admitting Diagnosis ED Disposition     ED Disposition  Admit   Condition  --   Comment  Hospital Area: [facility] [[id number]]  Level of Care: Med-Surg [16]  May place patient in observation at [facility] or [facility] if equivalent level of care is available:: Yes  Covid Evaluation: Asymptomatic - no recent exposure (last 10 days) testing not required  Diagnosis: Abdominal pain [744753]  Admitting Physician: Maryln Gottron [[id number]]  Attending Physician: Kirby Crigler, MIR [email or web address] [[id number]]          Medical History Past Medical History:  Diagnosis Date   Diabetes mellitus without complication (HCC)    Hyperlipidemia    Hypertension     Allergies Allergies  Allergen Reactions   Diltiazem Hcl Hives    Not sure if she has a problem with this medication.   Penicillins    Simvastatin Other (See Comments)    myalgia   Wellbutrin [Bupropion] Hives    IV Location/Drains/Wounds Patient Lines/Drains/Airways Status     Active Line/Drains/Airways     Name Placement date Placement time Site Days   Peripheral IV 07/07/23 Anterior;Left;Proximal Forearm 07/07/23  1126  Forearm  less than 1            Labs/Imaging Results for orders placed or performed during the hospital encounter of 07/07/23 (from the past 48 hour(s))  Lipase, blood     Status: None   Collection Time: 07/07/23 11:11 AM  Result Value Ref Range   Lipase 36 11 - 51 U/L    Comment: Performed at  [facility], [address] [place], [place] [address]  Comprehensive metabolic panel     Status: Abnormal   Collection Time: 07/07/23 11:11 AM  Result Value Ref Range   Sodium 139 135 - 145 mmol/L   Potassium 3.5 3.5 - 5.1 mmol/L   Chloride 106 98 - 111 mmol/L   CO2 25 22 - 32 mmol/L   Glucose, Bld 99 70 - 99 mg/dL    Comment: Glucose reference range applies only to samples taken after fasting for at least 8 hours.   BUN 17 8 - 23 mg/dL   Creatinine, Ser 8.29 0.44 - 1.00 mg/dL   Calcium 8.4 (L) 8.9 - 10.3 mg/dL   Total Protein 5.9 (L) 6.5 - 8.1 g/dL   Albumin 3.6 3.5 - 5.0 g/dL   AST 12 (L) 15 - 41 U/L   ALT 10 0 - 44 U/L   Alkaline Phosphatase 48 38 - 126 U/L   Total Bilirubin 0.7 <1.2 mg/dL   GFR, Estimated >56 >21 mL/min    Comment: (NOTE) Calculated using the CKD-EPI Creatinine Equation (2021)    Anion gap 8 5 - 15    Comment: Performed at [facility], [address]  [place], [place] [address]  CBC     Status: Abnormal   Collection Time: 07/07/23 11:11 AM  Result Value Ref Range   WBC 9.9 4.0 - 10.5 K/uL   RBC 4.78 3.87 - 5.11 MIL/uL   Hemoglobin 15.8 (H) 12.0 - 15.0 g/dL   HCT 78.2 95.6 - 21.3 %   MCV 94.6 80.0 - 100.0 fL   MCH 33.1 26.0 - 34.0 pg   MCHC 35.0 30.0 - 36.0 g/dL   RDW 08.6 57.8 - 46.9 %   Platelets 129 (L) 150 - 400 K/uL   nRBC 0.0 0.0 - 0.2 %    Comment: Performed at [facility], [address], [place], [place] [address]  Urinalysis, Routine w reflex microscopic -Urine, Clean Catch     Status: Abnormal   Collection Time: 07/07/23  1:16 PM  Result Value Ref Range   Color, Urine YELLOW YELLOW   APPearance CLEAR CLEAR   Specific Gravity, Urine 1.016 1.005 - 1.030   pH 5.0 5.0 - 8.0   Glucose, UA >=500 (A) NEGATIVE mg/dL   Hgb urine dipstick MODERATE (A) NEGATIVE   Bilirubin Urine NEGATIVE NEGATIVE   Ketones, ur NEGATIVE NEGATIVE mg/dL   Protein, ur NEGATIVE NEGATIVE mg/dL   Nitrite  NEGATIVE NEGATIVE   Leukocytes,Ua NEGATIVE NEGATIVE   RBC / HPF 0-5 0 - 5 RBC/hpf   WBC, UA 0-5 0 - 5 WBC/hpf   Bacteria, UA RARE (A) NONE SEEN   Squamous Epithelial / HPF 0-5 0 - 5 /HPF    Comment: Performed at [facility], [address] [place], [place] [address]  POC occult blood, ED     Status: Abnormal   Collection Time: 07/07/23  4:18 PM  Result Value Ref Range   Fecal Occult Bld POSITIVE (A) NEGATIVE   CT ABDOMEN PELVIS W CONTRAST  Result Date: 07/07/2023 CLINICAL DATA:  Acute non localized abdominal pain. EXAM: CT ABDOMEN AND PELVIS WITH CONTRAST TECHNIQUE: Multidetector CT imaging of the abdomen and pelvis was performed using the standard protocol following bolus administration of intravenous contrast. RADIATION DOSE REDUCTION: This exam was performed according to the departmental dose-optimization program which includes automated exposure control, adjustment of the mA and/or kV according to patient size and/or use of iterative reconstruction technique. CONTRAST:  OMNIPAQUE IOHEXOL 300 MG/ML  SOLN COMPARISON:  06/28/2023 FINDINGS: Lower Chest: No acute findings. Hepatobiliary: No suspicious hepatic masses identified. Mild diffuse hepatic steatosis noted. Gallbladder is unremarkable. No evidence of biliary ductal dilatation. Pancreas:  No mass or inflammatory changes. Spleen: Within normal limits in size and appearance. Adrenals/Urinary Tract: No suspicious masses identified. No evidence of ureteral calculi or hydronephrosis. Stomach/Bowel: No evidence of obstruction, inflammatory process or abnormal fluid collections. Diverticulosis is seen mainly involving the sigmoid colon, however there is no evidence of diverticulitis. Vascular/Lymphatic: No pathologically enlarged lymph nodes. No acute vascular findings. Reproductive: 1 cm right-sided uterine fibroid noted. Adnexal regions are unremarkable. Other:  None. Musculoskeletal:  No suspicious bone lesions identified.  IMPRESSION: No acute findings. Colonic diverticulosis, without radiographic evidence of diverticulitis. Mild hepatic steatosis. Tiny uterine fibroid. Electronically Signed   By: Danae Orleans M.D.   On: 07/07/2023 15:21    Pending Labs Unresulted Labs (From admission, onward)     Start     Ordered   07/08/23 0500  Basic metabolic panel  Tomorrow morning,   R        07/07/23 1642   07/08/23 0500  CBC  Tomorrow morning,  R        07/07/23 1642   07/07/23 1642  HIV Antibody (routine testing w rflx)  (HIV Antibody (Routine testing w reflex) panel)  Once,   R        07/07/23 1642   07/07/23 1642  Hemoglobin A1c  (Glycemic Control (SSI)  Q 4 Hours / Glycemic Control (SSI)  AC +/- HS)  Once,   R       Comments: To assess prior glycemic control    07/07/23 1642            Vitals/Pain Today's Vitals   07/07/23 1415 07/07/23 1537 07/07/23 1605 07/07/23 1618  BP: (!) 178/95  (!) 202/90   Pulse: 71  78   Resp: 18  18   Temp:  98 F (36.7 C)    TempSrc:  Oral    SpO2: 97%  99%   Weight:      Height:      PainSc:    10-Worst pain ever    Isolation Precautions No active isolations  Medications Medications  enoxaparin (LOVENOX) injection 40 mg (has no administration in time range)  insulin aspart (novoLOG) injection 0-15 Units (has no administration in time range)  insulin aspart (novoLOG) injection 0-5 Units (has no administration in time range)  acetaminophen (TYLENOL) tablet 650 mg (has no administration in time range)    Or  acetaminophen (TYLENOL) suppository 650 mg (has no administration in time range)  oxyCODONE (Oxy IR/ROXICODONE) immediate release tablet 5 mg (has no administration in time range)  HYDROmorphone (DILAUDID) injection 0.5-1 mg (has no administration in time range)  traZODone (DESYREL) tablet 25 mg (has no administration in time range)  ondansetron (ZOFRAN) tablet 4 mg (has no administration in time range)    Or  ondansetron (ZOFRAN) injection 4 mg (has no  administration in time range)  albuterol (PROVENTIL) (2.5 MG/3ML) 0.083% nebulizer solution 2.5 mg (has no administration in time range)  fentaNYL (SUBLIMAZE) injection 50 mcg (50 mcg Intravenous Given 07/07/23 1146)  sodium chloride 0.9 % bolus 1,000 mL (1,000 mLs Intravenous New Bag/Given 07/07/23 1147)  fentaNYL (SUBLIMAZE) injection 50 mcg (50 mcg Intravenous Given 07/07/23 1258)  iohexol (OMNIPAQUE) 300 MG/ML solution 100 mL (100 mLs Intravenous Contrast Given 07/07/23 1345)  fentaNYL (SUBLIMAZE) injection 50 mcg (50 mcg Intravenous Given 07/07/23 1513)  HYDROmorphone (DILAUDID) injection 1 mg (1 mg Intravenous Given 07/07/23 1616)    Mobility walks

## 2023-07-07 NOTE — ED Notes (Addendum)
Pt allowed to take metoprolol from home, pt endorses taking her dose of metoprolol

## 2023-07-07 NOTE — H&P (Signed)
History and Physical  [name] UVO:[id number] DOB: 1955-08-01 DOA: 07/07/2023  PCP: Daisy Floro, MD   Chief Complaint: LLQ abdominal pain   HPI: [name] is a 67 y.o. female with medical history significant for hypertension, hyperlipidemia, non-insulin-dependent type 2 diabetes being admitted to the hospital with continued left lower quadrant abdominal pain.  Patient states she has been having this pain for the last month, it seems to be intermittent, but has gradually been worsening, in both frequency and intensity.  She was evaluated in the emergency department 11/30 for the same complaints was told that she was constipated has been aggressively taking bowel regimen since that time.  Has been having bowel movements on a regular basis, does not feel constipated.  Denies any nausea, vomiting, fevers, chills, diarrhea.  Pain is in the left lower quadrant, this feels like a sharp stabbing pain.  Came back to the emergency department today due to continued discomfort.  Review of Systems: Please see HPI for pertinent positives and negatives. A complete 10 system review of systems are otherwise negative.  Past Medical History:  Diagnosis Date   Diabetes mellitus without complication (HCC)    Hyperlipidemia    Hypertension    Past Surgical History:  Procedure Laterality Date   APPENDECTOMY     CESAREAN SECTION      Social History:  reports that she has been smoking cigarettes. She has never used smokeless tobacco. No history on file for alcohol use and drug use.   Allergies  Allergen Reactions   Diltiazem Hcl Hives    Not sure if she has a problem with this medication.   Penicillins    Simvastatin Other (See Comments)    myalgia   Wellbutrin [Bupropion] Hives    Family History  Problem Relation Age of Onset   Breast cancer Paternal Aunt        @ unknown age   BRCA 1/2 Neg Hx      Prior to Admission medications   Medication Sig Start Date End Date Taking?  Authorizing Provider  acetaminophen (TYLENOL) 500 MG tablet Take 1,000 mg by mouth every 6 (six) hours as needed for mild pain (pain score 1-3) or moderate pain (pain score 4-6).   Yes [provider]  ALPRAZolam Prudy Feeler) 1 MG tablet Take 1 mg by mouth 3 (three) times daily as needed. 02/11/22  Yes [provider]  aspirin EC 81 MG tablet Take 81 mg by mouth daily. Swallow whole.   Yes [provider]  bismuth subsalicylate (PEPTO BISMOL) 262 MG/15ML suspension Take 30 mLs by mouth every 6 (six) hours as needed for indigestion or diarrhea or loose stools.   Yes [provider]  calcium carbonate (TUMS - DOSED IN MG ELEMENTAL CALCIUM) 500 MG chewable tablet Chew 1 tablet by mouth daily as needed for indigestion or heartburn.   Yes [provider]  HYDROcodone-acetaminophen (NORCO) 10-325 MG tablet Take 1 tablet by mouth every 6 (six) hours as needed for moderate pain (pain score 4-6) or severe pain (pain score 7-10).   Yes [provider]  ibuprofen (ADVIL) 200 MG tablet Take 400-800 mg by mouth every 6 (six) hours as needed for mild pain (pain score 1-3) or moderate pain (pain score 4-6).   Yes [provider]  irbesartan (AVAPRO) 150 MG tablet Take 150 mg by mouth daily. 05/12/23  Yes [provider]  JARDIANCE 10 MG TABS tablet Take 10 mg by mouth daily. 02/01/22  Yes [provider]  levalbuterol (XOPENEX HFA) 45 MCG/ACT inhaler Inhale 1 puff into the lungs every 8 (eight) hours as needed for wheezing or shortness of breath. 06/23/18  Yes [provider]  loperamide (IMODIUM A-D) 2 MG tablet Take 2 mg by mouth 4 (four) times daily as needed for diarrhea or loose stools.   Yes [provider]  metaxalone (SKELAXIN) 800 MG tablet Take 800 mg by mouth 3 (three) times daily as needed. 03/03/22  Yes [provider]  metoprolol succinate (TOPROL-XL) 50 MG 24 hr tablet 1 TABLET ORALLY ONCE A DAY 90  DAYS Patient taking differently: Take 50 mg by mouth daily. 07/10/22  Yes Riddle, Luella Cook, NP  nicotine (NICODERM CQ - DOSED IN MG/24 HOURS) 21 mg/24hr patch Place 10.5 mg onto the skin daily. Cuts the patch   Yes [provider]  ondansetron (ZOFRAN-ODT) 4 MG disintegrating tablet Take 1 tablet (4 mg total) by mouth every 8 (eight) hours as needed for nausea or vomiting. 06/28/23  Yes Schutt, Edsel Petrin, PA-C  Potassium 99 MG TABS Take 2 tablets by mouth daily as needed.   Yes [provider]  pravastatin (PRAVACHOL) 40 MG tablet Take 1 tablet by mouth daily.   Yes [provider]  propranolol (INDERAL) 10 MG tablet Take 10-20 mg by mouth daily as needed (tachacardia). 09/11/15  Yes [provider]  traZODone (DESYREL) 100 MG tablet Take 200 mg by mouth at bedtime. 02/13/22  Yes [provider]    Physical Exam: BP (!) 202/90   Pulse 78   Temp 98 F (36.7 C) (Oral)   Resp 18   Ht 5\' 3"  (1.6 m)   Wt 65.8 kg   LMP 10/26/1998 (Within Years) Comment: ~IN 40s  SpO2 99%   BMI 25.69 kg/m   General:  Alert, oriented, calm, in no acute distress, looks comfortable currently, her husband is at the bedside Eyes: EOMI, clear conjuctivae, white sclerea Neck: supple, no masses, trachea mildline  Cardiovascular: RRR, no murmurs or rubs, no peripheral edema  Respiratory: clear to auscultation bilaterally, no wheezes, no crackles  Abdomen: soft, nontender, nondistended, normal bowel tones heard  Skin: dry, no rashes  Musculoskeletal: no joint effusions, normal range of motion  Psychiatric: appropriate affect, normal speech  Neurologic: extraocular muscles intact, clear speech, moving all extremities with intact sensorium         Labs on Admission:  Basic Metabolic Panel: Recent Labs  Lab 07/07/23 1111  NA 139  K 3.5  CL 106  CO2 25  GLUCOSE 99  BUN 17  CREATININE 0.78  CALCIUM 8.4*   Liver Function Tests: Recent Labs  Lab 07/07/23 1111   AST 12*  ALT 10  ALKPHOS 48  BILITOT 0.7  PROT 5.9*  ALBUMIN 3.6   Recent Labs  Lab 07/07/23 1111  LIPASE 36   No results for input(s): "AMMONIA" in the last 168 hours. CBC: Recent Labs  Lab 07/07/23 1111  WBC 9.9  HGB 15.8*  HCT 45.2  MCV 94.6  PLT 129*   Cardiac Enzymes: No results for input(s): "CKTOTAL", "CKMB", "CKMBINDEX", "TROPONINI" in the last 168 hours.  BNP (last 3 results) No results for input(s): "BNP" in the last 8760 hours.  ProBNP (last 3 results) No results for input(s): "PROBNP" in the last 8760 hours.  CBG: No results for input(s): "GLUCAP" in the last 168 hours.  Radiological Exams on Admission: CT ABDOMEN PELVIS W CONTRAST  Result Date: 07/07/2023 CLINICAL DATA:  Acute non localized  abdominal pain. EXAM: CT ABDOMEN AND PELVIS WITH CONTRAST TECHNIQUE: Multidetector CT imaging of the abdomen and pelvis was performed using the standard protocol following bolus administration of intravenous contrast. RADIATION DOSE REDUCTION: This exam was performed according to the departmental dose-optimization program which includes automated exposure control, adjustment of the mA and/or kV according to patient size and/or use of iterative reconstruction technique. CONTRAST:  OMNIPAQUE IOHEXOL 300 MG/ML  SOLN COMPARISON:  06/28/2023 FINDINGS: Lower Chest: No acute findings. Hepatobiliary: No suspicious hepatic masses identified. Mild diffuse hepatic steatosis noted. Gallbladder is unremarkable. No evidence of biliary ductal dilatation. Pancreas:  No mass or inflammatory changes. Spleen: Within normal limits in size and appearance. Adrenals/Urinary Tract: No suspicious masses identified. No evidence of ureteral calculi or hydronephrosis. Stomach/Bowel: No evidence of obstruction, inflammatory process or abnormal fluid collections. Diverticulosis is seen mainly involving the sigmoid colon, however there is no evidence of diverticulitis. Vascular/Lymphatic: No  pathologically enlarged lymph nodes. No acute vascular findings. Reproductive: 1 cm right-sided uterine fibroid noted. Adnexal regions are unremarkable. Other:  None. Musculoskeletal:  No suspicious bone lesions identified. IMPRESSION: No acute findings. Colonic diverticulosis, without radiographic evidence of diverticulitis. Mild hepatic steatosis. Tiny uterine fibroid. Electronically Signed   By: Danae Orleans M.D.   On: 07/07/2023 15:21    Assessment/Plan [name] is a 67 y.o. female with medical history significant for hypertension, hyperlipidemia, non-insulin-dependent type 2 diabetes being admitted to the hospital with continued left lower quadrant abdominal pain.   Left lower quadrant abdominal pain-etiology is unclear.  No evidence of acute infection, bowel ischemia, history not consistent with inflammatory or irritable bowel disease. -Observation admission -Regular diet as tolerated -Pain and nausea medication as needed -Check pelvic ultrasound to rule out ovarian cyst, pelvic adhesion, etc.  Hyperlipidemia-continue home statin  Hypertension-blood pressure initially uncontrolled due to abdominal pain, now improving.  Will continue her home Toprol-XL, irbesartan.  Anxiety-continue home Xanax as needed  DVT prophylaxis: Lovenox     Code Status: Full Code  Consults called: None  Admission status: Observation  Time spent: 56 minutes  Mattix Imhof Sharlette Dense MD [facility] Pager [phone]  If 7PM-7AM, please contact night-coverage [email or web address] Password [email or web address]  07/07/2023, 5:11 PM

## 2023-07-07 NOTE — ED Notes (Signed)
Pt back from CT scan, systolic pressures trending around 200, EDP notified

## 2023-07-08 ENCOUNTER — Encounter (HOSPITAL_COMMUNITY): Payer: Self-pay | Admitting: Internal Medicine

## 2023-07-08 ENCOUNTER — Other Ambulatory Visit (HOSPITAL_COMMUNITY): Payer: Self-pay

## 2023-07-08 DIAGNOSIS — B029 Zoster without complications: Secondary | ICD-10-CM | POA: Insufficient documentation

## 2023-07-08 DIAGNOSIS — R1032 Left lower quadrant pain: Secondary | ICD-10-CM | POA: Diagnosis not present

## 2023-07-08 LAB — BASIC METABOLIC PANEL
Anion gap: 9 (ref 5–15)
BUN: 13 mg/dL (ref 8–23)
CO2: 31 mmol/L (ref 22–32)
Calcium: 9.3 mg/dL (ref 8.9–10.3)
Chloride: 97 mmol/L — ABNORMAL LOW (ref 98–111)
Creatinine, Ser: 0.82 mg/dL (ref 0.44–1.00)
GFR, Estimated: >60 mL/min (ref >60)
Glucose, Bld: 108 mg/dL — ABNORMAL HIGH (ref 70–99)
Potassium: 3.7 mmol/L (ref 3.5–5.1)
Sodium: 137 mmol/L (ref 135–145)

## 2023-07-08 LAB — CBC
HCT: 51.2 % — ABNORMAL HIGH (ref 36.0–46.0)
Hemoglobin: 17.2 g/dL — ABNORMAL HIGH (ref 12.0–15.0)
MCH: 32 pg (ref 26.0–34.0)
MCHC: 33.6 g/dL (ref 30.0–36.0)
MCV: 95.2 fL (ref 80.0–100.0)
Platelets: 139 10*3/uL — ABNORMAL LOW (ref 150–400)
RBC: 5.38 MIL/uL — ABNORMAL HIGH (ref 3.87–5.11)
RDW: 12.7 % (ref 11.5–15.5)
WBC: 12.3 10*3/uL — ABNORMAL HIGH (ref 4.0–10.5)
nRBC: 0 % (ref 0.0–0.2)

## 2023-07-08 LAB — GLUCOSE, CAPILLARY: Glucose-Capillary: 130 mg/dL — ABNORMAL HIGH (ref 70–99)

## 2023-07-08 LAB — HIV ANTIBODY (ROUTINE TESTING W REFLEX): HIV Screen 4th Generation wRfx: NONREACTIVE

## 2023-07-08 MED ORDER — ALUM & MAG HYDROXIDE-SIMETH 200-200-20 MG/5ML PO SUSP
30.0000 mL | ORAL | Status: DC | PRN
  Administered 2023-07-08 (×2): 30 mL via ORAL
  Filled 2023-07-08 (×2): qty 30

## 2023-07-08 MED ORDER — TRAZODONE HCL 100 MG PO TABS: 200.0000 mg | ORAL_TABLET | Freq: Every day | ORAL | Status: DC

## 2023-07-08 MED ORDER — VALACYCLOVIR HCL 1 G PO TABS
2000.0000 mg | ORAL_TABLET | Freq: Three times a day (TID) | ORAL | 0 refills | Status: AC
  Filled 2023-07-08: qty 42, 11d supply, fill #0

## 2023-07-08 NOTE — Progress Notes (Signed)
 Patient discharged home, IV removed, discharge paperwork provided and explained, patient verbalized understanding.

## 2023-07-08 NOTE — Discharge Summary (Signed)
Physician Discharge Summary  [name] QMV:[id number] DOB: July 27, 1956 DOA: 07/07/2023  PCP: Daisy Floro, MD  Admit date: 07/07/2023 Discharge date: 07/08/2023    Admitted From: Home Disposition: Home  Recommendations for Outpatient Follow-up:  Follow up with PCP in 1-2 weeks Please obtain BMP/CBC in one week Please follow up with your PCP on the following pending results: Unresulted Labs (From admission, onward)     Start     Ordered   07/07/23 1642  HIV Antibody (routine testing w rflx)  (HIV Antibody (Routine testing w reflex) panel)  Once,   R        07/07/23 1642              Home Health: None Equipment/Devices: None  Discharge Condition: Stable CODE STATUS: Full code Diet recommendation: Cardiac  Following HPI is copied from admitting hospitalist H&P. HPI: [name] is a 67 y.o. female with medical history significant for hypertension, hyperlipidemia, non-insulin-dependent type 2 diabetes being admitted to the hospital with continued left lower quadrant abdominal pain.  Patient states she has been having this pain for the last month, it seems to be intermittent, but has gradually been worsening, in both frequency and intensity.  She was evaluated in the emergency department 11/30 for the same complaints was told that she was constipated has been aggressively taking bowel regimen since that time.  Has been having bowel movements on a regular basis, does not feel constipated.  Denies any nausea, vomiting, fevers, chills, diarrhea.  Pain is in the left lower quadrant, this feels like a sharp stabbing pain.  Came back to the emergency department today due to continued discomfort.   Subjective: Seen and examined, daughter at the bedside.  Patient says that she is feeling better.  She has no more abdominal pain or back pain.  She is good with going home.  Brief/Interim Summary: Patient was admitted to hospitalist service with left lower quadrant abdominal pain,  etiology is unclear, CT abdomen pelvis was unremarkable, pelvic ultrasound did not show ovaries or ovarian cyst and no other acute pathology.  She was admitted for pain management, she has no pain this morning.  However she told me that she had developed a rash on her flank/left area 2 days after her last presentation to the ED about 10 days ago.  She has been having some itching.  Last night she had some burning pain over there and itching as well.  On my examination, she does have some rash which appears to be crusted with cycles and in single dermatome.  It is not involving the frontal dermatome.  Clinically, she does appear to have shingles.  That can explain her symptoms as well.  For that, I am going to discharge her on 7 days of valacyclovir.  I have discussed this with the patient and her daughter and they are in agreement with going home.  I advised her to drink plenty of fluids while on valacyclovir and focus her diet/meals on more vegetables to avoid constipation for hemorrhoids.  She was FOBT positive in the ED which was secondary to hemorrhoids, she has frequent history of bright red blood per rectum secondary to hemorrhoids.  Resuming rest of the medications.  Discharge plan was discussed with patient and/or family member and they verbalized understanding and agreed with it.  Discharge Diagnoses:  Principal Problem:   Abdominal pain Active Problems:   Essential hypertension, benign   Shingles rash    Discharge Instructions   Allergies as  of 07/08/2023       Reactions   Diltiazem Hcl Hives   Not sure if she has a problem with this medication.   Penicillins    Simvastatin Other (See Comments)   myalgia   Wellbutrin [bupropion] Hives        Medication List     TAKE these medications    acetaminophen 500 MG tablet Commonly known as: TYLENOL Take 1,000 mg by mouth every 6 (six) hours as needed for mild pain (pain score 1-3) or moderate pain (pain score 4-6).   ALPRAZolam  1 MG tablet Commonly known as: XANAX Take 1 mg by mouth 3 (three) times daily as needed.   aspirin EC 81 MG tablet Take 81 mg by mouth daily. Swallow whole.   bismuth subsalicylate 262 MG/15ML suspension Commonly known as: PEPTO BISMOL Take 30 mLs by mouth every 6 (six) hours as needed for indigestion or diarrhea or loose stools.   calcium carbonate 500 MG chewable tablet Commonly known as: TUMS - dosed in mg elemental calcium Chew 1 tablet by mouth daily as needed for indigestion or heartburn.   HYDROcodone-acetaminophen 10-325 MG tablet Commonly known as: NORCO Take 1 tablet by mouth every 6 (six) hours as needed for moderate pain (pain score 4-6) or severe pain (pain score 7-10).   ibuprofen 200 MG tablet Commonly known as: ADVIL Take 400-800 mg by mouth every 6 (six) hours as needed for mild pain (pain score 1-3) or moderate pain (pain score 4-6).   irbesartan 150 MG tablet Commonly known as: AVAPRO Take 150 mg by mouth daily.   Jardiance 10 MG Tabs tablet Generic drug: empagliflozin Take 10 mg by mouth daily.   levalbuterol 45 MCG/ACT inhaler Commonly known as: XOPENEX HFA Inhale 1 puff into the lungs every 8 (eight) hours as needed for wheezing or shortness of breath.   loperamide 2 MG tablet Commonly known as: IMODIUM A-D Take 2 mg by mouth 4 (four) times daily as needed for diarrhea or loose stools.   metaxalone 800 MG tablet Commonly known as: SKELAXIN Take 800 mg by mouth 3 (three) times daily as needed.   metoprolol succinate 50 MG 24 hr tablet Commonly known as: TOPROL-XL 1 TABLET ORALLY ONCE A DAY 90 DAYS What changed:  how much to take how to take this when to take this additional instructions   nicotine 21 mg/24hr patch Commonly known as: NICODERM CQ - dosed in mg/24 hours Place 10.5 mg onto the skin daily. Cuts the patch   ondansetron 4 MG disintegrating tablet Commonly known as: ZOFRAN-ODT Take 1 tablet (4 mg total) by mouth every 8 (eight)  hours as needed for nausea or vomiting.   Potassium 99 MG Tabs Take 2 tablets by mouth daily as needed.   pravastatin 40 MG tablet Commonly known as: PRAVACHOL Take 1 tablet by mouth daily.   propranolol 10 MG tablet Commonly known as: INDERAL Take 10-20 mg by mouth daily as needed (tachacardia).   traZODone 100 MG tablet Commonly known as: DESYREL Take 200 mg by mouth at bedtime.   valACYclovir 1000 MG tablet Commonly known as: Valtrex Take 2 tablets (2,000 mg total) by mouth 3 (three) times daily for 7 days.        Follow-up Information     Daisy Floro, MD Follow up in 1 week(s).   Specialty: Family Medicine Contact information: [address] [place] [address] [phone]  Allergies  Allergen Reactions   Diltiazem Hcl Hives    Not sure if she has a problem with this medication.   Penicillins    Simvastatin Other (See Comments)    myalgia   Wellbutrin [Bupropion] Hives    Consultations: None   Procedures/Studies: US PELVIC COMPLETE WITH TRANSVAGINAL  Result Date: 07/07/2023 CLINICAL DATA:  Left lower quadrant abdominal pain. EXAM: TRANSABDOMINAL AND TRANSVAGINAL ULTRASOUND OF PELVIS TECHNIQUE: Both transabdominal and transvaginal ultrasound examinations of the pelvis were performed. Transabdominal technique was performed for global imaging of the pelvis including uterus, ovaries, adnexal regions, and pelvic cul-de-sac. It was necessary to proceed with endovaginal exam following the transabdominal exam to visualize the endometrium and ovaries. COMPARISON:  CT abdomen pelvis dated 07/07/2023. FINDINGS: Evaluation is limited due to overlying bowel gas. Uterus Measurements: 4.8 x 3.0 x 4.3 cm = volume: 37 mL. The uterus is anteverted and demonstrates a heterogeneous echotexture. There is a 1.5 x 1.7 x 1.4 cm posterior upper body fibroid. Endometrium Thickness: 7 mm. The endometrium is suboptimally visualized and poorly evaluated.  The endometrium is however thickened for a post menopausal female. Findings may represent endometrial hyperplasia, polyp, or neoplasm. Further evaluation with hysteroscopy is recommended. Right ovary Not visualized. Left ovary Not visualized. Other findings No abnormal free fluid. IMPRESSION: 1. Heterogeneous uterus with a small fibroid. 2. Thickened endometrium. Further evaluation with hysteroscopy is recommended 3. Nonvisualization of the ovaries. Electronically Signed   By: Elgie Collard M.D.   On: 07/07/2023 19:13   CT ABDOMEN PELVIS W CONTRAST  Result Date: 07/07/2023 CLINICAL DATA:  Acute non localized abdominal pain. EXAM: CT ABDOMEN AND PELVIS WITH CONTRAST TECHNIQUE: Multidetector CT imaging of the abdomen and pelvis was performed using the standard protocol following bolus administration of intravenous contrast. RADIATION DOSE REDUCTION: This exam was performed according to the departmental dose-optimization program which includes automated exposure control, adjustment of the mA and/or kV according to patient size and/or use of iterative reconstruction technique. CONTRAST:  OMNIPAQUE IOHEXOL 300 MG/ML  SOLN COMPARISON:  06/28/2023 FINDINGS: Lower Chest: No acute findings. Hepatobiliary: No suspicious hepatic masses identified. Mild diffuse hepatic steatosis noted. Gallbladder is unremarkable. No evidence of biliary ductal dilatation. Pancreas:  No mass or inflammatory changes. Spleen: Within normal limits in size and appearance. Adrenals/Urinary Tract: No suspicious masses identified. No evidence of ureteral calculi or hydronephrosis. Stomach/Bowel: No evidence of obstruction, inflammatory process or abnormal fluid collections. Diverticulosis is seen mainly involving the sigmoid colon, however there is no evidence of diverticulitis. Vascular/Lymphatic: No pathologically enlarged lymph nodes. No acute vascular findings. Reproductive: 1 cm right-sided uterine fibroid noted. Adnexal regions are  unremarkable. Other:  None. Musculoskeletal:  No suspicious bone lesions identified. IMPRESSION: No acute findings. Colonic diverticulosis, without radiographic evidence of diverticulitis. Mild hepatic steatosis. Tiny uterine fibroid. Electronically Signed   By: Danae Orleans M.D.   On: 07/07/2023 15:21   CT ABDOMEN PELVIS W CONTRAST  Result Date: 06/28/2023 CLINICAL DATA:  Left lower quadrant abdominal pain. EXAM: CT ABDOMEN AND PELVIS WITH CONTRAST TECHNIQUE: Multidetector CT imaging of the abdomen and pelvis was performed using the standard protocol following bolus administration of intravenous contrast. RADIATION DOSE REDUCTION: This exam was performed according to the departmental dose-optimization program which includes automated exposure control, adjustment of the mA and/or kV according to patient size and/or use of iterative reconstruction technique. CONTRAST:  OMNIPAQUE IOHEXOL 300 MG/ML  SOLN COMPARISON:  PET-CT scan 10/25/2020. FINDINGS: Lower chest: Mild linear opacity along the lung bases likely  scar or atelectasis. No pleural effusion Hepatobiliary: Fatty liver infiltration. Patent portal vein. Gallbladder is nondilated. Pancreas: Unremarkable. No pancreatic ductal dilatation or surrounding inflammatory changes. Spleen: Normal in size without focal abnormality. Adrenals/Urinary Tract: Stable nodular thickening of the adrenal glands. 9 mm low-attenuation lesion in the right kidney on series 12, image 11, too small to completely characterize but likely a benign cystic lesion, Bosniak 2. No specific imaging follow-up. No enhancing renal mass. Preserved contours of the urinary bladder. Stomach/Bowel: Diffuse colonic stool. Large bowel has a normal course and caliber. Few sigmoid colon diverticula. Stomach is underdistended. There are some small bowel loops in the midabdomen which are mildly distended up to 2.7 cm. There is some small bowel stool appearance and some fluid in the bowel in this  location, nonspecific. Appendix not well seen in the right lower quadrant but no pericecal stranding or fluid. Vascular/Lymphatic: Aortic atherosclerosis. No enlarged abdominal or pelvic lymph nodes. Circumaortic left renal vein. Reproductive: Uterus and bilateral adnexa are unremarkable. Other: No free air or free fluid. Small bilateral fat containing inguinal hernias Musculoskeletal: Mild degenerative changes along the spine. IMPRESSION: Fatty liver infiltration. No bowel obstruction, free air or free fluid. Scattered stool. Few colonic diverticula. Few small bowel loops distally are mildly distended with debris and fluid. Nonspecific Electronically Signed   By: Karen Kays M.D.   On: 06/28/2023 13:13     Discharge Exam: Vitals:   07/08/23 0532 07/08/23 1021  BP: (!) 177/78 129/65  Pulse: 66 73  Resp:  17  Temp:  99.1 F (37.3 C)  SpO2:  92%   Vitals:   07/08/23 0138 07/08/23 0454 07/08/23 0532 07/08/23 1021  BP: (!) 173/76 (!) 191/99 (!) 177/78 129/65  Pulse: (!) 58 75 66 73  Resp: 18 18  17   Temp: 99 F (37.2 C) 99.1 F (37.3 C)  99.1 F (37.3 C)  TempSrc: Oral Oral  Oral  SpO2: 98% 98%  92%  Weight:      Height:        General: Pt is alert, awake, not in acute distress Cardiovascular: RRR, S1/S2 +, no rubs, no gallops Respiratory: CTA bilaterally, no wheezing, no rhonchi Abdominal: Soft, NT, ND, bowel sounds + Extremities: no edema, no cyanosis Crusted vesicles on the left flank area.    The results of significant diagnostics from this hospitalization (including imaging, microbiology, ancillary and laboratory) are listed below for reference.     Microbiology: No results found for this or any previous visit (from the past 240 hour(s)).   Labs: BNP (last 3 results) No results for input(s): "BNP" in the last 8760 hours. Basic Metabolic Panel: Recent Labs  Lab 07/07/23 1111 07/08/23 0407  NA 139 137  K 3.5 3.7  CL 106 97*  CO2 25 31  GLUCOSE 99 108*  BUN 17  13  CREATININE 0.78 0.82  CALCIUM 8.4* 9.3   Liver Function Tests: Recent Labs  Lab 07/07/23 1111  AST 12*  ALT 10  ALKPHOS 48  BILITOT 0.7  PROT 5.9*  ALBUMIN 3.6   Recent Labs  Lab 07/07/23 1111  LIPASE 36   No results for input(s): "AMMONIA" in the last 168 hours. CBC: Recent Labs  Lab 07/07/23 1111 07/08/23 0407  WBC 9.9 12.3*  HGB 15.8* 17.2*  HCT 45.2 51.2*  MCV 94.6 95.2  PLT 129* 139*   Cardiac Enzymes: No results for input(s): "CKTOTAL", "CKMB", "CKMBINDEX", "TROPONINI" in the last 168 hours. BNP: Invalid input(s): "POCBNP" CBG: Recent Labs  Lab 07/07/23 1836 07/07/23 2122 07/08/23 0729  GLUCAP 128* 146* 130*   D-Dimer No results for input(s): "DDIMER" in the last 72 hours. Hgb A1c Recent Labs    07/07/23 1830  HGBA1C 5.7*   Lipid Profile No results for input(s): "CHOL", "HDL", "LDLCALC", "TRIG", "CHOLHDL", "LDLDIRECT" in the last 72 hours. Thyroid function studies No results for input(s): "TSH", "T4TOTAL", "T3FREE", "THYROIDAB" in the last 72 hours.  Invalid input(s): "FREET3" Anemia work up No results for input(s): "VITAMINB12", "FOLATE", "FERRITIN", "TIBC", "IRON", "RETICCTPCT" in the last 72 hours. Urinalysis    Component Value Date/Time   COLORURINE YELLOW 07/07/2023 1316   APPEARANCEUR CLEAR 07/07/2023 1316   LABSPEC 1.016 07/07/2023 1316   PHURINE 5.0 07/07/2023 1316   GLUCOSEU >=500 (A) 07/07/2023 1316   HGBUR MODERATE (A) 07/07/2023 1316   BILIRUBINUR NEGATIVE 07/07/2023 1316   KETONESUR NEGATIVE 07/07/2023 1316   PROTEINUR NEGATIVE 07/07/2023 1316   UROBILINOGEN 0.2 11/10/2009 0919   NITRITE NEGATIVE 07/07/2023 1316   LEUKOCYTESUR NEGATIVE 07/07/2023 1316   Sepsis Labs Recent Labs  Lab 07/07/23 1111 07/08/23 0407  WBC 9.9 12.3*   Microbiology No results found for this or any previous visit (from the past 240 hour(s)).  FURTHER DISCHARGE INSTRUCTIONS:   Get Medicines reviewed and adjusted: Please take all your  medications with you for your next visit with your Primary MD   Laboratory/radiological data: Please request your Primary MD to go over all hospital tests and procedure/radiological results at the follow up, please ask your Primary MD to get all Hospital records sent to his/her office.   In some cases, they will be blood work, cultures and biopsy results pending at the time of your discharge. Please request that your primary care M.D. goes through all the records of your hospital data and follows up on these results.   Also Note the following: If you experience worsening of your admission symptoms, develop shortness of breath, life threatening emergency, suicidal or homicidal thoughts you must seek medical attention immediately by calling 911 or calling your MD immediately  if symptoms less severe.   You must read complete instructions/literature along with all the possible adverse reactions/side effects for all the Medicines you take and that have been prescribed to you. Take any new Medicines after you have completely understood and accpet all the possible adverse reactions/side effects.    Do not drive when taking Pain medications or sleeping medications (Benzodaizepines)   Do not take more than prescribed Pain, Sleep and Anxiety Medications. It is not advisable to combine anxiety,sleep and pain medications without talking with your primary care practitioner   Special Instructions: If you have smoked or chewed Tobacco  in the last 2 yrs please stop smoking, stop any regular Alcohol  and or any Recreational drug use.   Wear Seat belts while driving.   Please note: You were cared for by a hospitalist during your hospital stay. Once you are discharged, your primary care physician will handle any further medical issues. Please note that NO REFILLS for any discharge medications will be authorized once you are discharged, as it is imperative that you return to your primary care physician (or  establish a relationship with a primary care physician if you do not have one) for your post hospital discharge needs so that they can reassess your need for medications and monitor your lab values  Time coordinating discharge: Over 30 minutes  SIGNED:   Hughie Closs, MD  [facility] 07/08/2023, 11:18 AM *Please note  that this is a verbal dictation therefore any spelling or grammatical errors are due to the "[employer]" system interpretation. If 7PM-7AM, please contact night-coverage [email or web address]

## 2023-07-11 ENCOUNTER — Other Ambulatory Visit (HOSPITAL_COMMUNITY): Payer: Self-pay

## 2023-07-15 DIAGNOSIS — Z09 Encounter for follow-up examination after completed treatment for conditions other than malignant neoplasm: Secondary | ICD-10-CM | POA: Diagnosis not present

## 2023-07-15 DIAGNOSIS — I471 Supraventricular tachycardia, unspecified: Secondary | ICD-10-CM | POA: Diagnosis not present

## 2023-07-15 DIAGNOSIS — E119 Type 2 diabetes mellitus without complications: Secondary | ICD-10-CM | POA: Diagnosis not present

## 2023-07-15 DIAGNOSIS — D696 Thrombocytopenia, unspecified: Secondary | ICD-10-CM | POA: Diagnosis not present

## 2023-07-15 DIAGNOSIS — B0229 Other postherpetic nervous system involvement: Secondary | ICD-10-CM | POA: Diagnosis not present

## 2023-07-15 DIAGNOSIS — I700 Atherosclerosis of aorta: Secondary | ICD-10-CM | POA: Diagnosis not present

## 2023-07-15 DIAGNOSIS — R1032 Left lower quadrant pain: Secondary | ICD-10-CM | POA: Diagnosis not present

## 2023-07-18 DIAGNOSIS — K5904 Chronic idiopathic constipation: Secondary | ICD-10-CM | POA: Diagnosis not present

## 2023-07-18 DIAGNOSIS — R933 Abnormal findings on diagnostic imaging of other parts of digestive tract: Secondary | ICD-10-CM | POA: Diagnosis not present

## 2023-07-18 DIAGNOSIS — K625 Hemorrhage of anus and rectum: Secondary | ICD-10-CM | POA: Diagnosis not present

## 2023-07-18 DIAGNOSIS — B0229 Other postherpetic nervous system involvement: Secondary | ICD-10-CM | POA: Diagnosis not present

## 2023-07-18 DIAGNOSIS — K5730 Diverticulosis of large intestine without perforation or abscess without bleeding: Secondary | ICD-10-CM | POA: Diagnosis not present

## 2023-07-18 DIAGNOSIS — K219 Gastro-esophageal reflux disease without esophagitis: Secondary | ICD-10-CM | POA: Diagnosis not present

## 2023-07-18 DIAGNOSIS — K760 Fatty (change of) liver, not elsewhere classified: Secondary | ICD-10-CM | POA: Diagnosis not present

## 2023-07-18 DIAGNOSIS — R1032 Left lower quadrant pain: Secondary | ICD-10-CM | POA: Diagnosis not present

## 2023-09-04 DIAGNOSIS — F5101 Primary insomnia: Secondary | ICD-10-CM | POA: Diagnosis not present

## 2023-09-04 DIAGNOSIS — F3342 Major depressive disorder, recurrent, in full remission: Secondary | ICD-10-CM | POA: Diagnosis not present

## 2023-09-04 DIAGNOSIS — F410 Panic disorder [episodic paroxysmal anxiety] without agoraphobia: Secondary | ICD-10-CM | POA: Diagnosis not present

## 2023-09-12 DIAGNOSIS — R6884 Jaw pain: Secondary | ICD-10-CM | POA: Diagnosis not present

## 2023-10-03 DIAGNOSIS — I10 Essential (primary) hypertension: Secondary | ICD-10-CM | POA: Diagnosis not present

## 2023-10-03 DIAGNOSIS — E1169 Type 2 diabetes mellitus with other specified complication: Secondary | ICD-10-CM | POA: Diagnosis not present

## 2024-03-04 DIAGNOSIS — F410 Panic disorder [episodic paroxysmal anxiety] without agoraphobia: Secondary | ICD-10-CM | POA: Diagnosis not present

## 2024-03-04 DIAGNOSIS — F3342 Major depressive disorder, recurrent, in full remission: Secondary | ICD-10-CM | POA: Diagnosis not present

## 2024-03-04 DIAGNOSIS — F5101 Primary insomnia: Secondary | ICD-10-CM | POA: Diagnosis not present

## 2024-04-06 DIAGNOSIS — M791 Myalgia, unspecified site: Secondary | ICD-10-CM | POA: Diagnosis not present

## 2024-04-06 DIAGNOSIS — I4729 Other ventricular tachycardia: Secondary | ICD-10-CM | POA: Diagnosis not present

## 2024-04-06 DIAGNOSIS — Z1231 Encounter for screening mammogram for malignant neoplasm of breast: Secondary | ICD-10-CM | POA: Diagnosis not present

## 2024-04-06 DIAGNOSIS — J45909 Unspecified asthma, uncomplicated: Secondary | ICD-10-CM | POA: Diagnosis not present

## 2024-04-06 DIAGNOSIS — R9389 Abnormal findings on diagnostic imaging of other specified body structures: Secondary | ICD-10-CM | POA: Diagnosis not present

## 2024-04-06 DIAGNOSIS — Z23 Encounter for immunization: Secondary | ICD-10-CM | POA: Diagnosis not present

## 2024-04-06 DIAGNOSIS — Z0000 Encounter for general adult medical examination without abnormal findings: Secondary | ICD-10-CM | POA: Diagnosis not present

## 2024-04-06 DIAGNOSIS — E782 Mixed hyperlipidemia: Secondary | ICD-10-CM | POA: Diagnosis not present

## 2024-04-06 DIAGNOSIS — I10 Essential (primary) hypertension: Secondary | ICD-10-CM | POA: Diagnosis not present

## 2024-04-06 DIAGNOSIS — E1169 Type 2 diabetes mellitus with other specified complication: Secondary | ICD-10-CM | POA: Diagnosis not present

## 2024-04-07 ENCOUNTER — Other Ambulatory Visit: Payer: Self-pay | Admitting: Family Medicine

## 2024-04-07 DIAGNOSIS — R9389 Abnormal findings on diagnostic imaging of other specified body structures: Secondary | ICD-10-CM

## 2024-04-14 ENCOUNTER — Ambulatory Visit
Admission: RE | Admit: 2024-04-14 | Discharge: 2024-04-14 | Disposition: A | Discharge status: Home / Self Care | Source: Ambulatory Visit | Attending: Family Medicine | Admitting: Family Medicine

## 2024-04-14 DIAGNOSIS — R911 Solitary pulmonary nodule: Secondary | ICD-10-CM | POA: Diagnosis not present

## 2024-04-14 DIAGNOSIS — J90 Pleural effusion, not elsewhere classified: Secondary | ICD-10-CM | POA: Diagnosis not present

## 2024-04-14 DIAGNOSIS — R9389 Abnormal findings on diagnostic imaging of other specified body structures: Secondary | ICD-10-CM

## 2024-05-03 DIAGNOSIS — R9389 Abnormal findings on diagnostic imaging of other specified body structures: Secondary | ICD-10-CM | POA: Diagnosis not present

## 2024-05-04 ENCOUNTER — Other Ambulatory Visit (HOSPITAL_COMMUNITY): Payer: Self-pay | Admitting: Family Medicine

## 2024-05-04 DIAGNOSIS — R9389 Abnormal findings on diagnostic imaging of other specified body structures: Secondary | ICD-10-CM

## 2024-05-10 ENCOUNTER — Other Ambulatory Visit (HOSPITAL_COMMUNITY)

## 2024-05-13 ENCOUNTER — Encounter (HOSPITAL_COMMUNITY)
Admission: RE | Admit: 2024-05-13 | Discharge: 2024-05-13 | Disposition: A | Discharge status: Home / Self Care | Source: Ambulatory Visit | Attending: Family Medicine | Admitting: Family Medicine

## 2024-05-13 DIAGNOSIS — R911 Solitary pulmonary nodule: Secondary | ICD-10-CM | POA: Diagnosis not present

## 2024-05-13 DIAGNOSIS — R9389 Abnormal findings on diagnostic imaging of other specified body structures: Secondary | ICD-10-CM | POA: Insufficient documentation

## 2024-05-13 DIAGNOSIS — R590 Localized enlarged lymph nodes: Secondary | ICD-10-CM | POA: Diagnosis not present

## 2024-05-13 LAB — GLUCOSE, CAPILLARY: Glucose-Capillary: 107 mg/dL — ABNORMAL HIGH (ref 70–99)

## 2024-05-13 MED ORDER — FLUDEOXYGLUCOSE F - 18 (FDG) INJECTION
7.5000 | Freq: Once | INTRAVENOUS | Status: AC
  Administered 2024-05-13: 7.43 via INTRAVENOUS

## 2024-05-29 NOTE — Progress Notes (Signed)
 New Patient Pulmonology Office Visit   Subjective:  Patient ID: [name], female    DOB: 1956/07/06  MRN: [id number]  Referred by: Okey Carlin Redbird, MD  CC:  Chief Complaint  Patient presents with   Consult    Pulmonary nodules    HPI [name] is a 68 y.o. female with DM, HTN, HLD who presents for initial consultation in the setting of RML Solitary Pulmonary Nodule.  Discussed the use of AI scribe software for clinical note transcription with the patient, who gave verbal consent to proceed.  History of Present Illness [name] is a 68 year old female with a history of tuberculosis who presents with a growing lung nodule.  A lung nodule was first identified at age 29 during pregnancy, coinciding with a positive tuberculosis test and subsequent year-long treatment. The nodule was initially noted in 2019 as 8 millimeters in size and has grown to 15 by 8 millimeters as of September this year. Two PET scans, one in 2020, showed activity in the nodule and a lymph node in the right lung.  She experiences coughing and weight loss, which she attributes to managing her diabetes. No recent pneumonia or significant respiratory infections aside from her past tuberculosis diagnosis.  Her past medical history includes diabetes, managed with Jardiance, and tachycardia, managed with metoprolol  and propranolol. She also takes Xanax  for anxiety. She has a history of smoking, having quit 10 days ago, and has smoked intermittently since age 56. Her family history includes breast cancer in an aunt but no known lung cancer. She reports a past allergy to penicillin, which she believes she may have outgrown, and denies any latex allergy. She is not on blood thinners but takes aspirin  occasionally.  Symptoms Associated with Lung cancer:   Central Tumor Sx: Dyspnea and Cough  Peripheral Tumor Sx: Cough  Sx of Metastasis: none  Conditions associated with lung cancer & imp to identify prior to  bronch: COPD  Hx of Anesthesia reactions: none  PMH:  - HTN - HLD - DM  Important Medications: Aspirin  81 mg daily  Allergies: penicillins  Social History:  Smoking and [employer] Exposure: Tobacco Smoking 50 PY  No occupational or military exposures  Family History: History of other types of cancers: aunt with breast CA  ASA grade:  ASA 2 - Patient with mild systemic disease with no functional limitations  Karnofsky Performance Status: 90 Able to carry on normal activity, minor signs, or symptoms of disease  ECOG Performance Status: (0) Fully active, able to carry on all predisease performance without restriction  ROS  Allergies: Diltiazem hcl, Penicillins, Simvastatin, and Wellbutrin [bupropion]  Current Outpatient Medications:    acetaminophen  (TYLENOL ) 500 MG tablet, Take 1,000 mg by mouth every 6 (six) hours as needed for mild pain (pain score 1-3) or moderate pain (pain score 4-6)., Disp: , Rfl:    ALPRAZolam  (XANAX ) 1 MG tablet, Take 1 mg by mouth 3 (three) times daily as needed., Disp: , Rfl:    aspirin  EC 81 MG tablet, Take 81 mg by mouth daily. Swallow whole., Disp: , Rfl:    calcium carbonate (TUMS - DOSED IN MG ELEMENTAL CALCIUM) 500 MG chewable tablet, Chew 1 tablet by mouth daily as needed for indigestion or heartburn., Disp: , Rfl:    ibuprofen (ADVIL) 200 MG tablet, Take 400-800 mg by mouth every 6 (six) hours as needed for mild pain (pain score 1-3) or moderate pain (pain score 4-6)., Disp: , Rfl:  irbesartan  (AVAPRO ) 150 MG tablet, Take 150 mg by mouth daily., Disp: , Rfl:    JARDIANCE 10 MG TABS tablet, Take 10 mg by mouth daily., Disp: , Rfl:    levalbuterol (XOPENEX HFA) 45 MCG/ACT inhaler, Inhale 1 puff into the lungs every 8 (eight) hours as needed for wheezing or shortness of breath., Disp: , Rfl:    loperamide (IMODIUM A-D) 2 MG tablet, Take 2 mg by mouth 4 (four) times daily as needed for diarrhea or loose stools., Disp: , Rfl:    metaxalone  (SKELAXIN) 800 MG tablet, Take 800 mg by mouth 3 (three) times daily as needed., Disp: , Rfl:    metoprolol  succinate (TOPROL -XL) 50 MG 24 hr tablet, 1 TABLET ORALLY ONCE A DAY 90 DAYS, Disp: 90 tablet, Rfl: 3   nicotine (NICODERM CQ - DOSED IN MG/24 HOURS) 21 mg/24hr patch, Place 10.5 mg onto the skin daily. Cuts the patch, Disp: , Rfl:    pravastatin  (PRAVACHOL ) 40 MG tablet, Take 1 tablet by mouth daily., Disp: , Rfl:    propranolol (INDERAL) 10 MG tablet, Take 10-20 mg by mouth daily as needed (tachacardia)., Disp: , Rfl:    traZODone  (DESYREL ) 100 MG tablet, Take 200 mg by mouth at bedtime., Disp: , Rfl:    bismuth subsalicylate (PEPTO BISMOL) 262 MG/15ML suspension, Take 30 mLs by mouth every 6 (six) hours as needed for indigestion or diarrhea or loose stools. (Patient not taking: Reported on 05/31/2024), Disp: , Rfl:    HYDROcodone-acetaminophen  (NORCO) 10-325 MG tablet, Take 1 tablet by mouth every 6 (six) hours as needed for moderate pain (pain score 4-6) or severe pain (pain score 7-10). (Patient not taking: Reported on 05/31/2024), Disp: , Rfl:    ondansetron  (ZOFRAN -ODT) 4 MG disintegrating tablet, Take 1 tablet (4 mg total) by mouth every 8 (eight) hours as needed for nausea or vomiting. (Patient not taking: Reported on 05/31/2024), Disp: 20 tablet, Rfl: 0   Potassium 99 MG TABS, Take 2 tablets by mouth daily as needed. (Patient not taking: Reported on 05/31/2024), Disp: , Rfl:  Past Medical History:  Diagnosis Date   Diabetes mellitus without complication (HCC)    Hyperlipidemia    Hypertension    Past Surgical History:  Procedure Laterality Date   APPENDECTOMY     CESAREAN SECTION     Family History  Problem Relation Age of Onset   Breast cancer Paternal Aunt        @ unknown age   BRCA 1/2 Neg Hx    Social History   Socioeconomic History   Marital status: Married    Spouse name: Not on file   Number of children: Not on file   Years of education: Not on file   Highest  education level: Not on file  Occupational History   Not on file  Tobacco Use   Smoking status: Former    Current packs/day: 0.00    Average packs/day: 0.5 packs/day for 3.8 years (1.9 ttl pk-yrs)    Types: Cigarettes    Start date: 2022    Quit date: 05/22/2024    Years since quitting: 0.0   Smokeless tobacco: Never  Substance and Sexual Activity   Alcohol use: Not on file   Drug use: Not on file   Sexual activity: Not on file  Other Topics Concern   Not on file  Social History Narrative   Not on file   Social Drivers of Health   Financial Resource Strain: Not on file  Food Insecurity: No Food Insecurity (07/07/2023)   Hunger Vital Sign    Worried About Running Out of Food in the Last Year: Never true    Ran Out of Food in the Last Year: Never true  Transportation Needs: No Transportation Needs (07/07/2023)   PRAPARE - Administrator, Civil Service (Medical): No    Lack of Transportation (Non-Medical): No  Physical Activity: Not on file  Stress: Not on file  Social Connections: Not on file  Intimate Partner Violence: Not At Risk (07/07/2023)   Humiliation, Afraid, Rape, and Kick questionnaire    Fear of Current or Ex-Partner: No    Emotionally Abused: No    Physically Abused: No    Sexually Abused: No       Objective:  BP (!) 177/88   Pulse 62   Ht 5' 3 (1.6 m)   Wt 150 lb 1.6 oz (68.1 kg)   LMP 10/26/1998 (Within Years) Comment: ~IN 40s  SpO2 99%   BMI 26.59 kg/m  Wt Readings from Last 3 Encounters:  05/31/24 150 lb 1.6 oz (68.1 kg)  05/13/24 149 lb (67.6 kg)  07/07/23 145 lb (65.8 kg)   BMI Readings from Last 3 Encounters:  05/31/24 26.59 kg/m  05/13/24 26.39 kg/m  07/07/23 25.69 kg/m   Physical Exam General: NAD, alert, WD, WN Eyes: PERRL, no scleral icterus ENMT: oropharynx clear, good dentition, no oral lesions, mallampati score I Skin: warm, intact, no rashes Neck: JVD flat, ROM and lymph node assessment normal CV: RRR, no MRG,  nl S1 and S2, no peripheral edema Resp: clear to auscultation bilaterally, no wheezes, rales, or rhonchi, normal effort, no clubbing/cyanosis Neuro: Awake alert oriented to person place time and situation  Diagnostic Review:  Last CBC Lab Results  Component Value Date   WBC 12.3 (H) 07/08/2023   HGB 17.2 (H) 07/08/2023   HCT 51.2 (H) 07/08/2023   MCV 95.2 07/08/2023   MCH 32.0 07/08/2023   RDW 12.7 07/08/2023   PLT 139 (L) 07/08/2023   Last metabolic panel Lab Results  Component Value Date   GLUCOSE 108 (H) 07/08/2023   NA 137 07/08/2023   K 3.7 07/08/2023   CL 97 (L) 07/08/2023   CO2 31 07/08/2023   BUN 13 07/08/2023   CREATININE 0.82 07/08/2023   GFRNONAA >60 07/08/2023   CALCIUM 9.3 07/08/2023   PROT 5.9 (L) 07/07/2023   ALBUMIN 3.6 07/07/2023   BILITOT 0.7 07/07/2023   ALKPHOS 48 07/07/2023   AST 12 (L) 07/07/2023   ALT 10 07/07/2023   ANIONGAP 9 07/08/2023    CT Chest 04/14/2024: IMPRESSION: 1. Spiculated pulmonary nodule in the right middle lobe with pleural retraction, demonstrating slow interval growth since 2019, consistent with an indolent bronchogenic neoplasm. 2. Scattered bilateral upper lobe peribronchial ground glass pulmonary infiltrate, progressive since prior examination, likely reflecting progressive changes of respiratory bronchiolitis/smoking related lung disease or hypersensitivity pneumonitis.  PET/CT 05/13/2024: IMPRESSION: 1. Spiculated right middle lobe pulmonary nodule with mildly abnormally elevated maximum SUV of 2.7, concerning for potential malignancy. 2. Small right lower hilar lymph node with mildly elevated maximum SUV of 3.1. 3. Sigmoid colon diverticulosis. 4. Atherosclerosis.    Assessment & Plan:   Assessment & Plan Chronic hoarseness Dysphonia Likely related to long-term smoking history. Possible vocal cord pathology. - Referred to ENT for vocal cord evaluation. Solitary pulmonary nodule Right lung solitary pulmonary  nodule with increasing size and associated w/ PET avid right hilar lymphadenopathy Nodule increased from 8  mm in 2019 to 15 mm by 8 mm in 2025. PET scan showed lymph node uptake, raising concern for metastasis. Differential includes slow-growing lung cancer or inflammation. Biopsy needed for diagnosis. - Ordered super D scan - Scheduled robotic bronchoscopy with biopsy under general anesthesia. - Instructed to stop aspirin  and Jardiance two days before procedure. - Discussed biopsy risks: sore throat, coughing, blood-tinged phlegm, infection, bleeding, pneumothorax (<3% risk). - PFTs Cigarette nicotine dependence without complication Quit 10 days ago.  Orders Placed This Encounter  Procedures   Procedural/ Surgical Case Request: VIDEO BRONCHOSCOPY WITH ENDOBRONCHIAL NAVIGATION, ENDOBRONCHIAL ULTRASOUND (EBUS)   CT SUPER D CHEST WO CONTRAST   Ambulatory referral to ENT   Pulmonary function test   I spent 45 minutes reviewing patient's chart including prior consultant notes, imaging, and PFTs as well as face-to-face with the patient, over half in discussion of the diagnosis and the importance of compliance with the treatment plan.  Return in about 4 weeks (around 06/28/2024).   Jakin Pavao, MD

## 2024-05-29 NOTE — H&P (View-Only) (Signed)
 New Patient Pulmonology Office Visit   Subjective:  Patient ID: [name], female    DOB: 1956/07/06  MRN: [id number]  Referred by: Okey Carlin Redbird, MD  CC:  Chief Complaint  Patient presents with   Consult    Pulmonary nodules    HPI [name] is a 68 y.o. female with DM, HTN, HLD who presents for initial consultation in the setting of RML Solitary Pulmonary Nodule.  Discussed the use of AI scribe software for clinical note transcription with the patient, who gave verbal consent to proceed.  History of Present Illness [name] is a 68 year old female with a history of tuberculosis who presents with a growing lung nodule.  A lung nodule was first identified at age 29 during pregnancy, coinciding with a positive tuberculosis test and subsequent year-long treatment. The nodule was initially noted in 2019 as 8 millimeters in size and has grown to 15 by 8 millimeters as of September this year. Two PET scans, one in 2020, showed activity in the nodule and a lymph node in the right lung.  She experiences coughing and weight loss, which she attributes to managing her diabetes. No recent pneumonia or significant respiratory infections aside from her past tuberculosis diagnosis.  Her past medical history includes diabetes, managed with Jardiance, and tachycardia, managed with metoprolol  and propranolol. She also takes Xanax  for anxiety. She has a history of smoking, having quit 10 days ago, and has smoked intermittently since age 56. Her family history includes breast cancer in an aunt but no known lung cancer. She reports a past allergy to penicillin, which she believes she may have outgrown, and denies any latex allergy. She is not on blood thinners but takes aspirin  occasionally.  Symptoms Associated with Lung cancer:   Central Tumor Sx: Dyspnea and Cough  Peripheral Tumor Sx: Cough  Sx of Metastasis: none  Conditions associated with lung cancer & imp to identify prior to  bronch: COPD  Hx of Anesthesia reactions: none  PMH:  - HTN - HLD - DM  Important Medications: Aspirin  81 mg daily  Allergies: penicillins  Social History:  Smoking and [employer] Exposure: Tobacco Smoking 50 PY  No occupational or military exposures  Family History: History of other types of cancers: aunt with breast CA  ASA grade:  ASA 2 - Patient with mild systemic disease with no functional limitations  Karnofsky Performance Status: 90 Able to carry on normal activity, minor signs, or symptoms of disease  ECOG Performance Status: (0) Fully active, able to carry on all predisease performance without restriction  ROS  Allergies: Diltiazem hcl, Penicillins, Simvastatin, and Wellbutrin [bupropion]  Current Outpatient Medications:    acetaminophen  (TYLENOL ) 500 MG tablet, Take 1,000 mg by mouth every 6 (six) hours as needed for mild pain (pain score 1-3) or moderate pain (pain score 4-6)., Disp: , Rfl:    ALPRAZolam  (XANAX ) 1 MG tablet, Take 1 mg by mouth 3 (three) times daily as needed., Disp: , Rfl:    aspirin  EC 81 MG tablet, Take 81 mg by mouth daily. Swallow whole., Disp: , Rfl:    calcium carbonate (TUMS - DOSED IN MG ELEMENTAL CALCIUM) 500 MG chewable tablet, Chew 1 tablet by mouth daily as needed for indigestion or heartburn., Disp: , Rfl:    ibuprofen (ADVIL) 200 MG tablet, Take 400-800 mg by mouth every 6 (six) hours as needed for mild pain (pain score 1-3) or moderate pain (pain score 4-6)., Disp: , Rfl:  irbesartan  (AVAPRO ) 150 MG tablet, Take 150 mg by mouth daily., Disp: , Rfl:    JARDIANCE 10 MG TABS tablet, Take 10 mg by mouth daily., Disp: , Rfl:    levalbuterol (XOPENEX HFA) 45 MCG/ACT inhaler, Inhale 1 puff into the lungs every 8 (eight) hours as needed for wheezing or shortness of breath., Disp: , Rfl:    loperamide (IMODIUM A-D) 2 MG tablet, Take 2 mg by mouth 4 (four) times daily as needed for diarrhea or loose stools., Disp: , Rfl:    metaxalone  (SKELAXIN) 800 MG tablet, Take 800 mg by mouth 3 (three) times daily as needed., Disp: , Rfl:    metoprolol  succinate (TOPROL -XL) 50 MG 24 hr tablet, 1 TABLET ORALLY ONCE A DAY 90 DAYS, Disp: 90 tablet, Rfl: 3   nicotine (NICODERM CQ - DOSED IN MG/24 HOURS) 21 mg/24hr patch, Place 10.5 mg onto the skin daily. Cuts the patch, Disp: , Rfl:    pravastatin  (PRAVACHOL ) 40 MG tablet, Take 1 tablet by mouth daily., Disp: , Rfl:    propranolol (INDERAL) 10 MG tablet, Take 10-20 mg by mouth daily as needed (tachacardia)., Disp: , Rfl:    traZODone  (DESYREL ) 100 MG tablet, Take 200 mg by mouth at bedtime., Disp: , Rfl:    bismuth subsalicylate (PEPTO BISMOL) 262 MG/15ML suspension, Take 30 mLs by mouth every 6 (six) hours as needed for indigestion or diarrhea or loose stools. (Patient not taking: Reported on 05/31/2024), Disp: , Rfl:    HYDROcodone-acetaminophen  (NORCO) 10-325 MG tablet, Take 1 tablet by mouth every 6 (six) hours as needed for moderate pain (pain score 4-6) or severe pain (pain score 7-10). (Patient not taking: Reported on 05/31/2024), Disp: , Rfl:    ondansetron  (ZOFRAN -ODT) 4 MG disintegrating tablet, Take 1 tablet (4 mg total) by mouth every 8 (eight) hours as needed for nausea or vomiting. (Patient not taking: Reported on 05/31/2024), Disp: 20 tablet, Rfl: 0   Potassium 99 MG TABS, Take 2 tablets by mouth daily as needed. (Patient not taking: Reported on 05/31/2024), Disp: , Rfl:  Past Medical History:  Diagnosis Date   Diabetes mellitus without complication (HCC)    Hyperlipidemia    Hypertension    Past Surgical History:  Procedure Laterality Date   APPENDECTOMY     CESAREAN SECTION     Family History  Problem Relation Age of Onset   Breast cancer Paternal Aunt        @ unknown age   BRCA 1/2 Neg Hx    Social History   Socioeconomic History   Marital status: Married    Spouse name: Not on file   Number of children: Not on file   Years of education: Not on file   Highest  education level: Not on file  Occupational History   Not on file  Tobacco Use   Smoking status: Former    Current packs/day: 0.00    Average packs/day: 0.5 packs/day for 3.8 years (1.9 ttl pk-yrs)    Types: Cigarettes    Start date: 2022    Quit date: 05/22/2024    Years since quitting: 0.0   Smokeless tobacco: Never  Substance and Sexual Activity   Alcohol use: Not on file   Drug use: Not on file   Sexual activity: Not on file  Other Topics Concern   Not on file  Social History Narrative   Not on file   Social Drivers of Health   Financial Resource Strain: Not on file  Food Insecurity: No Food Insecurity (07/07/2023)   Hunger Vital Sign    Worried About Running Out of Food in the Last Year: Never true    Ran Out of Food in the Last Year: Never true  Transportation Needs: No Transportation Needs (07/07/2023)   PRAPARE - Administrator, Civil Service (Medical): No    Lack of Transportation (Non-Medical): No  Physical Activity: Not on file  Stress: Not on file  Social Connections: Not on file  Intimate Partner Violence: Not At Risk (07/07/2023)   Humiliation, Afraid, Rape, and Kick questionnaire    Fear of Current or Ex-Partner: No    Emotionally Abused: No    Physically Abused: No    Sexually Abused: No       Objective:  BP (!) 177/88   Pulse 62   Ht 5' 3 (1.6 m)   Wt 150 lb 1.6 oz (68.1 kg)   LMP 10/26/1998 (Within Years) Comment: ~IN 40s  SpO2 99%   BMI 26.59 kg/m  Wt Readings from Last 3 Encounters:  05/31/24 150 lb 1.6 oz (68.1 kg)  05/13/24 149 lb (67.6 kg)  07/07/23 145 lb (65.8 kg)   BMI Readings from Last 3 Encounters:  05/31/24 26.59 kg/m  05/13/24 26.39 kg/m  07/07/23 25.69 kg/m   Physical Exam General: NAD, alert, WD, WN Eyes: PERRL, no scleral icterus ENMT: oropharynx clear, good dentition, no oral lesions, mallampati score I Skin: warm, intact, no rashes Neck: JVD flat, ROM and lymph node assessment normal CV: RRR, no MRG,  nl S1 and S2, no peripheral edema Resp: clear to auscultation bilaterally, no wheezes, rales, or rhonchi, normal effort, no clubbing/cyanosis Neuro: Awake alert oriented to person place time and situation  Diagnostic Review:  Last CBC Lab Results  Component Value Date   WBC 12.3 (H) 07/08/2023   HGB 17.2 (H) 07/08/2023   HCT 51.2 (H) 07/08/2023   MCV 95.2 07/08/2023   MCH 32.0 07/08/2023   RDW 12.7 07/08/2023   PLT 139 (L) 07/08/2023   Last metabolic panel Lab Results  Component Value Date   GLUCOSE 108 (H) 07/08/2023   NA 137 07/08/2023   K 3.7 07/08/2023   CL 97 (L) 07/08/2023   CO2 31 07/08/2023   BUN 13 07/08/2023   CREATININE 0.82 07/08/2023   GFRNONAA >60 07/08/2023   CALCIUM 9.3 07/08/2023   PROT 5.9 (L) 07/07/2023   ALBUMIN 3.6 07/07/2023   BILITOT 0.7 07/07/2023   ALKPHOS 48 07/07/2023   AST 12 (L) 07/07/2023   ALT 10 07/07/2023   ANIONGAP 9 07/08/2023    CT Chest 04/14/2024: IMPRESSION: 1. Spiculated pulmonary nodule in the right middle lobe with pleural retraction, demonstrating slow interval growth since 2019, consistent with an indolent bronchogenic neoplasm. 2. Scattered bilateral upper lobe peribronchial ground glass pulmonary infiltrate, progressive since prior examination, likely reflecting progressive changes of respiratory bronchiolitis/smoking related lung disease or hypersensitivity pneumonitis.  PET/CT 05/13/2024: IMPRESSION: 1. Spiculated right middle lobe pulmonary nodule with mildly abnormally elevated maximum SUV of 2.7, concerning for potential malignancy. 2. Small right lower hilar lymph node with mildly elevated maximum SUV of 3.1. 3. Sigmoid colon diverticulosis. 4. Atherosclerosis.    Assessment & Plan:   Assessment & Plan Chronic hoarseness Dysphonia Likely related to long-term smoking history. Possible vocal cord pathology. - Referred to ENT for vocal cord evaluation. Solitary pulmonary nodule Right lung solitary pulmonary  nodule with increasing size and associated w/ PET avid right hilar lymphadenopathy Nodule increased from 8  mm in 2019 to 15 mm by 8 mm in 2025. PET scan showed lymph node uptake, raising concern for metastasis. Differential includes slow-growing lung cancer or inflammation. Biopsy needed for diagnosis. - Ordered super D scan - Scheduled robotic bronchoscopy with biopsy under general anesthesia. - Instructed to stop aspirin  and Jardiance two days before procedure. - Discussed biopsy risks: sore throat, coughing, blood-tinged phlegm, infection, bleeding, pneumothorax (<3% risk). - PFTs Cigarette nicotine dependence without complication Quit 10 days ago.  Orders Placed This Encounter  Procedures   Procedural/ Surgical Case Request: VIDEO BRONCHOSCOPY WITH ENDOBRONCHIAL NAVIGATION, ENDOBRONCHIAL ULTRASOUND (EBUS)   CT SUPER D CHEST WO CONTRAST   Ambulatory referral to ENT   Pulmonary function test   I spent 45 minutes reviewing patient's chart including prior consultant notes, imaging, and PFTs as well as face-to-face with the patient, over half in discussion of the diagnosis and the importance of compliance with the treatment plan.  Return in about 4 weeks (around 06/28/2024).   Jakin Pavao, MD

## 2024-05-31 ENCOUNTER — Encounter (HOSPITAL_BASED_OUTPATIENT_CLINIC_OR_DEPARTMENT_OTHER): Payer: Self-pay | Admitting: Pulmonary Disease

## 2024-05-31 ENCOUNTER — Encounter: Payer: Self-pay | Admitting: Pulmonary Disease

## 2024-05-31 ENCOUNTER — Ambulatory Visit (INDEPENDENT_AMBULATORY_CARE_PROVIDER_SITE_OTHER): Admitting: Pulmonary Disease

## 2024-05-31 ENCOUNTER — Telehealth: Payer: Self-pay | Admitting: Pulmonary Disease

## 2024-05-31 VITALS — BP 177/88 | HR 62 | Ht 63.0 in | Wt 150.1 lb

## 2024-05-31 DIAGNOSIS — R911 Solitary pulmonary nodule: Secondary | ICD-10-CM | POA: Diagnosis not present

## 2024-05-31 DIAGNOSIS — R490 Dysphonia: Secondary | ICD-10-CM | POA: Diagnosis not present

## 2024-05-31 DIAGNOSIS — F17211 Nicotine dependence, cigarettes, in remission: Secondary | ICD-10-CM

## 2024-05-31 DIAGNOSIS — F17210 Nicotine dependence, cigarettes, uncomplicated: Secondary | ICD-10-CM

## 2024-05-31 NOTE — Telephone Encounter (Signed)
 Please schedule the following:  Provider performing procedure:Vy Badley/HATTAR Diagnosis: Solitary Pulmonary Nodule Which side if for nodule / mass? Right Procedure: Navigation and EBUS  Has patient been spoken to by Provider and given informed consent? Yes Anesthesia: General Do you need Fluro? Yes Duration of procedure: 1.5 hours Date: 11/6 Alternate Date: 11/10  Time: Any Location: [facility] Does patient have OSA? No DM? Yes Or Latex allergy? No Medication Restriction/ Anticoagulate/Antiplatelet: Stop aspirin  2 day prior to procedure Pre-op Labs Ordered:determined by Anesthesia Imaging request: Super D CT prior to bronchoscopy  (If, SuperDimension CT Chest, please have STAT courier sent to ENDO)

## 2024-05-31 NOTE — Telephone Encounter (Signed)
 Sending to Shakimah to auth. Letter given to Jamie

## 2024-05-31 NOTE — Patient Instructions (Signed)
  VISIT SUMMARY: During your visit, we discussed the growth of a lung nodule and planned a biopsy to determine its nature. We also addressed your voice changes, diabetes management, and recent smoking cessation.  YOUR PLAN: RIGHT LUNG SOLITARY PULMONARY NODULE: A nodule in your right lung has grown and needs further evaluation to rule out cancer or inflammation. -We have ordered a super D scan to get a detailed map of the nodule and lymph node. -You are scheduled for a robotic bronchoscopy with biopsy under general anesthesia. -Stop taking aspirin  and Jardiance two days before the procedure. -Be aware of the biopsy risks: sore throat, coughing, blood-tinged phlegm, infection, bleeding, and a small risk of lung collapse. -If the biopsy confirms cancer and lymph node involvement, we will refer you to a thoracic surgeon.  DYSPHONIA: You have changes in your voice likely related to your history of smoking. -We have referred you to an ENT specialist for a vocal cord evaluation.  TYPE 2 DIABETES MELLITUS: Your diabetes is well-managed with Jardiance, and your recent A1c is 6. -Stop taking Jardiance two days before the biopsy procedure.  HISTORY OF TACHYCARDIA: Your tachycardia is managed with metoprolol  and propranolol. -Continue taking metoprolol  and propranolol as prescribed. -You may take Xanax  on the day of the procedure if needed.  HISTORY OF SMOKING, RECENTLY QUIT: You recently quit smoking, which is crucial for your lung health and reducing cancer risk. -Continue to stay off smoking.  Contains text generated by [employer].

## 2024-05-31 NOTE — Assessment & Plan Note (Signed)
 Right lung solitary pulmonary nodule with increasing size and associated w/ PET avid right hilar lymphadenopathy Nodule increased from 8 mm in 2019 to 15 mm by 8 mm in 2025. PET scan showed lymph node uptake, raising concern for metastasis. Differential includes slow-growing lung cancer or inflammation. Biopsy needed for diagnosis. - Ordered super D scan - Scheduled robotic bronchoscopy with biopsy under general anesthesia. - Instructed to stop aspirin  and Jardiance two days before procedure. - Discussed biopsy risks: sore throat, coughing, blood-tinged phlegm, infection, bleeding, pneumothorax (<3% risk). - PFTs

## 2024-06-02 ENCOUNTER — Ambulatory Visit: Payer: Self-pay | Admitting: Pulmonary Disease

## 2024-06-02 ENCOUNTER — Ambulatory Visit (HOSPITAL_BASED_OUTPATIENT_CLINIC_OR_DEPARTMENT_OTHER)
Admission: RE | Admit: 2024-06-02 | Discharge: 2024-06-02 | Disposition: A | Discharge status: Home / Self Care | Source: Ambulatory Visit | Attending: Pulmonary Disease | Admitting: Pulmonary Disease

## 2024-06-02 ENCOUNTER — Other Ambulatory Visit: Payer: Self-pay

## 2024-06-02 ENCOUNTER — Encounter (HOSPITAL_COMMUNITY): Payer: Self-pay | Admitting: Pulmonary Disease

## 2024-06-02 DIAGNOSIS — R911 Solitary pulmonary nodule: Secondary | ICD-10-CM | POA: Diagnosis not present

## 2024-06-02 NOTE — Telephone Encounter (Signed)
 Spoke with pt the correct test was ordered which is a CT explained this to her and also gave her the number for Imaging to ask about fasting questions

## 2024-06-02 NOTE — Progress Notes (Signed)
 Anesthesia Chart Review:  Case: [id number] Date/Time: 06/03/24 1245   Procedures:      VIDEO BRONCHOSCOPY WITH ENDOBRONCHIAL NAVIGATION (Right)     ENDOBRONCHIAL ULTRASOUND (EBUS) (Bilateral)   Anesthesia type: General   Diagnosis: Solitary pulmonary nodule [R91.1]   Pre-op diagnosis: solitary pulmonary nodule   [facility] / [facility]   Surgeons: Catherine Cools, MD       DISCUSSION: Patient is a 68 year old female scheduled for the above procedure. She has a RML spiculated lung nodule that increased in size by 03/2014 CT. PCP referred her to pulmonology for further evaluation and biopsy.  History includes recent former smoker (quit 05/22/2024), DM2, HTN, HLD, tachycardia (SVT, s/p adenosine ~ 2008; recurrence ~ 02/2022), OSA (moderate 04/2022). Per pulmonology notes she received a year of treatment following a + TB test.   She has had sporadic cardiology evaluations. Initially ~ 2008 for SVT treated with adenosine, and then in 2011 following syncope/orthostatic hypotension. Most recently, she was evaluated in 2023 after EMS evaluation for palpitations and thought to be in afib with RVR with rate ~ 140's-150's--later reviewed by EP and thought likely SVT. Eliquis  was discontinued after 05/2022 Zio monitor showed no afib or sustained arrhythmias. 02/2022 TTE showed normal LV function and no significant valvular disease. 04/2022 CCTA showed CAC 47 (68th percentile), mild (1-24%) plaque in the mLAD and pRCA. Sleep study in 04/2022 showed moderate OSA.  Last visit 07/10/2022 with Riddle, Suzann, NP. She had no recurrent sustained tachycardia and had propranolol to take as needed for tachycardia. She reported last propranolol last week. She is on Toprol  50 mg daily and pravastatin  40 mg daily. She denied chest pain and SOB.   Last Jardiance 05/30/2024. A1c 5.7% on 07/07/2023.  Last aspirin  05/30/2024.  Anesthesia team to evaluate on the day of surgery.   VS: Ht 5' 3 (1.6 m)    Wt 68.1 kg   LMP 10/26/1998 (Within Years) Comment: ~IN 40s  BMI 26.59 kg/m  BP Readings from Last 3 Encounters:  05/31/24 (!) 177/88  07/08/23 129/65  06/28/23 (!) 168/99   Pulse Readings from Last 3 Encounters:  05/31/24 62  07/08/23 73  06/28/23 66     PROVIDERS: Okey Carlin Redbird, MD is PCP  Catherine Cools, MD is pulmonologist Cindie Smalls, MD is EP cardiologist    LABS: For day of surgery. Most recent results in [facility] include: Lab Results  Component Value Date   WBC 12.3 (H) 07/08/2023   HGB 17.2 (H) 07/08/2023   HCT 51.2 (H) 07/08/2023   PLT 139 (L) 07/08/2023   GLUCOSE 108 (H) 07/08/2023   ALT 10 07/07/2023   AST 12 (L) 07/07/2023   NA 137 07/08/2023   K 3.7 07/08/2023   CL 97 (L) 07/08/2023   CREATININE 0.82 07/08/2023   BUN 13 07/08/2023   CO2 31 07/08/2023   TSH 3.532 02/28/2022   INR 1.0 02/28/2022   HGBA1C 5.7 (H) 07/07/2023     Sleep Study 05/17/2022: IMPRESSIONS - Moderate obstructive sleep apnea overall (AHI  24.2/h); however, sleep apnea was severe with supine sleep (AHI 41.6/h) - Mild oxygen desaturation to a nmair of 88%. - Patient snored for 116.7 minutes (27.1%) during the sleep. RECOMMENDATIONS - In this patient with cardiovascular comorbidities recommend therapeutic CPAP for treatment of her sleep disordered breathing. Can itiate a trial of Auto-PAP with EPR of 3 at 7 - 16 cm of water...   IMAGES: PET Scan 05/13/2024:  IMPRESSION: 1. Spiculated right  middle lobe pulmonary nodule with mildly abnormally elevated maximum SUV of 2.7, concerning for potential malignancy. 2. Small right lower hilar lymph node with mildly elevated maximum SUV of 3.1. 3. Sigmoid colon diverticulosis. 4. Atherosclerosis.  CT Chest 04/14/2024: IMPRESSION: 1. Spiculated pulmonary nodule in the right middle lobe with pleural retraction, demonstrating slow interval growth since 2019, consistent with an indolent bronchogenic neoplasm. 2. Scattered  bilateral upper lobe peribronchial ground glass pulmonary infiltrate, progressive since prior examination, likely reflecting progressive changes of respiratory bronchiolitis/smoking related lung disease or hypersensitivity pneumonitis.   EKG: For day of surgery as indicated.  Last EKG tracing noted is from 05/30/2022 showing sinus rhythm with occasional PVCs, nonspecific ST abnormality, prolonged QT (QT 416 ms, QTc 495 ms).   CV: Long term monitor 06/02/2022 - 06/16/2022: HR 52 - 182 bpm, average 76 bpm. 2 nonsustained VT episodes, longest 9 beats 2 nonsustained SVT episodes, longest 6 beats. Occasional supraventricular ectopy, 1.6% Frequent ventricular ectopy, 7% No sustained arrhythmias. Symptom triggered episodes correspond to sinus rhythm with PVCs.   CT Coronary Morph w/CTA 05/02/2022: Coronary Arteries: Right dominant with no anomalies LM: No plaque or stenosis. LAD system: Noncalcified plaque in the mid LAD, mild (1-24%) stenosis. Circumflex system: No plaque or stenosis. RCA system: Calcified plaque proximal RCA, mild (1-24%) stenosis.  IMPRESSION: 1. Coronary artery calcium score 47 Agatston units. This places the patient in the 68th percentile for age and gender, suggesting intermediate risk for future events. 2.  Mild nonobstructive coronary disease.    Echo 03/22/2022: IMPRESSIONS   1. Left ventricular ejection fraction, by estimation, is 60 to 65%. The  left ventricle has normal function. The left ventricle has no regional  wall motion abnormalities. Left ventricular diastolic parameters are  indeterminate.   2. Right ventricular systolic function is normal. The right ventricular  size is normal.   3. The mitral valve is normal in structure. Trivial mitral valve  regurgitation.   4. The aortic valve is tricuspid. Aortic valve regurgitation is not  visualized.   5. The inferior vena cava is normal in size with greater than 50%  respiratory variability,  suggesting right atrial pressure of 3 mmHg.     Past Medical History:  Diagnosis Date   Diabetes mellitus without complication (HCC)    Hyperlipidemia    Hypertension    Tachycardia     Past Surgical History:  Procedure Laterality Date   APPENDECTOMY     CESAREAN SECTION      MEDICATIONS: No current facility-administered medications for this encounter.    acetaminophen  (TYLENOL ) 500 MG tablet   ALPRAZolam  (XANAX ) 1 MG tablet   aspirin  EC 81 MG tablet   bismuth subsalicylate (PEPTO BISMOL) 262 MG/15ML suspension   calcium carbonate (TUMS - DOSED IN MG ELEMENTAL CALCIUM) 500 MG chewable tablet   HYDROcodone-acetaminophen  (NORCO) 10-325 MG tablet   ibuprofen (ADVIL) 200 MG tablet   irbesartan  (AVAPRO ) 150 MG tablet   JARDIANCE 10 MG TABS tablet   levalbuterol (XOPENEX HFA) 45 MCG/ACT inhaler   loperamide (IMODIUM A-D) 2 MG tablet   metaxalone (SKELAXIN) 800 MG tablet   metoprolol  succinate (TOPROL -XL) 50 MG 24 hr tablet   nicotine (NICODERM CQ - DOSED IN MG/24 HOURS) 21 mg/24hr patch   ondansetron  (ZOFRAN -ODT) 4 MG disintegrating tablet   Potassium 99 MG TABS   pravastatin  (PRAVACHOL ) 40 MG tablet   propranolol (INDERAL) 10 MG tablet   traZODone  (DESYREL ) 100 MG tablet    Isaiah Ruder, PA-C Surgical Short Stay/Anesthesiology [facility] Phone [phone]  [phone] [facility] Phone [phone] 06/02/2024 2:59 PM

## 2024-06-02 NOTE — Anesthesia Preprocedure Evaluation (Signed)
 Anesthesia Evaluation  Patient identified by MRN, date of birth, ID band Patient awake    Reviewed: Allergy & Precautions, NPO status , Patient's Chart, lab work & pertinent test results  Airway Mallampati: II  TM Distance: >3 FB Neck ROM: Full    Dental no notable dental hx.    Pulmonary sleep apnea , former smoker   Pulmonary exam normal breath sounds clear to auscultation       Cardiovascular hypertension, Pt. on home beta blockers and Pt. on medications Normal cardiovascular exam Rhythm:Regular Rate:Normal  CT Coronary Morph w/CTA 05/02/2022: Coronary Arteries: Right dominant with no anomalies LM: No plaque or stenosis. LAD system: Noncalcified plaque in the mid LAD, mild (1-24%) stenosis. Circumflex system: No plaque or stenosis. RCA system: Calcified plaque proximal RCA, mild (1-24%) stenosis.   IMPRESSION: 1. Coronary artery calcium score 47 Agatston units. This places the patient in the 68th percentile for age and gender, suggesting intermediate risk for future events. 2.  Mild nonobstructive coronary disease.     Echo 03/22/2022: IMPRESSIONS   1. Left ventricular ejection fraction, by estimation, is 60 to 65%. The  left ventricle has normal function. The left ventricle has no regional  wall motion abnormalities. Left ventricular diastolic parameters are  indeterminate.   2. Right ventricular systolic function is normal. The right ventricular  size is normal.   3. The mitral valve is normal in structure. Trivial mitral valve  regurgitation.   4. The aortic valve is tricuspid. Aortic valve regurgitation is not  visualized.   5. The inferior vena cava is normal in size with greater than 50%  respiratory variability, suggesting right atrial pressure of 3 mmHg.     Neuro/Psych negative neurological ROS     GI/Hepatic negative GI ROS, Neg liver ROS,,,  Endo/Other  diabetes    Renal/GU negative Renal ROS      Musculoskeletal negative musculoskeletal ROS (+)    Abdominal   Peds  Hematology negative hematology ROS (+)   Anesthesia Other Findings   Reproductive/Obstetrics                              Anesthesia Physical Anesthesia Plan  ASA: 3  Anesthesia Plan: General   Post-op Pain Management: Minimal or no pain anticipated   Induction: Intravenous  PONV Risk Score and Plan: Treatment may vary due to age or medical condition, Propofol infusion, TIVA, Ondansetron  and Dexamethasone  Airway Management Planned: Oral ETT and Video Laryngoscope Planned  Additional Equipment:   Intra-op Plan:   Post-operative Plan: Extubation in OR  Informed Consent: I have reviewed the patients History and Physical, chart, labs and discussed the procedure including the risks, benefits and alternatives for the proposed anesthesia with the patient or authorized representative who has indicated his/her understanding and acceptance.     Dental advisory given  Plan Discussed with: CRNA  Anesthesia Plan Comments: (PAT note written 06/02/2024 by Allison Zelenak, PA-C.  )         Anesthesia Quick Evaluation

## 2024-06-02 NOTE — Telephone Encounter (Signed)
 Patient states that she had a recent CT and a recent PET scan and was wondering if she is supposed to have an MRI today instead of a CT. She also states that she wasn't given any instructions beforehand prior to today's test.  Patient thought that the doctor needed an MRI.  Patient wanted clarification--she thought she was supposed to have an MRI today but is scheduled for a CT Patient wants to know more about what she should do as far as eating or drinking prior to this test today at 5:15pm. She is advised to call us  back with any further questions or concerns.  FYI Only or Action Required?: Action required by provider: clinical question for provider.  Patient is followed in Pulmonology for chronic hoarseness, last seen on 05/31/2024 by Catherine Cools, MD.  Called Nurse Triage reporting Question about Imaging.  Triage Disposition: Call PCP When Office is Open  Patient/caregiver understands and will follow disposition?: Yes             Copied from CRM [id number]. Topic: Clinical - Medical Advice >> Jun 02, 2024 11:52 AM Nathanel DEL wrote: Reason for CRM: pt is having surgery tomorrow, and she thought he said the dr needed an MRI.  Pt is scheduled for a CT.  Pt wants to make sure she is getting the right procedure. Reason for Disposition  [1] Caller requesting NON-URGENT health information AND [2] PCP's office is the best resource  Answer Assessment - Initial Assessment Questions Patient states that she had a recent CT and a recent PET scan and was wondering if she is supposed to have an MRI today instead of a CT. She also states that she wasn't given any instructions beforehand prior to today's test.  Patient thought that the doctor needed an MRI.  Patient wants to know more about what she should do as far as eating or drinking prior to this test today at 5:15pm. She is advised to call us  back with any further questions or concerns.  Answer Assessment - Initial Assessment  Questions Patient states that she had a recent CT and a recent PET scan and was wondering if she is supposed to have an MRI today instead of a CT. She also states that she wasn't given any instructions beforehand prior to today's test.  Patient thought that the doctor needed an MRI.  Patient wants to know more about what she should do as far as eating or drinking prior to this test today at 5:15pm. She is advised to call us  back with any further questions or concerns.  Protocols used: PCP Call - No Triage-A-AH, Information Only Call - No Triage-A-AH

## 2024-06-02 NOTE — Progress Notes (Signed)
 PCP - Okey Carlin Redbird, MD   Cardiologist - No longer follows up with cardiology  PPM/ICD - denies Device Orders - n/a Rep Notified - n/a  Chest x-ray - n/a EKG - DOS Stress Test - 2011 ECHO - 03-22-22 Cardiac Cath -   CPAP - denies  GLP-1 -denies  Dm -denies JARDIANCE last dose 05-31-24  Blood Thinner Instructions: denies Aspirin  Instructions: last dose 05-30-24  ERAS Protcol - n/a  COVID TEST- n/a  Anesthesia review: yes hx of htn tachycardia. Per patient she took propranolol last week for increase heart rate  Patient verbally denies any shortness of breath, fever, cough and chest pain during phone call   -------------  SDW INSTRUCTIONS given:  Your procedure is scheduled on June 03, 2024.  Report to [facility] Main Entrance A at 10:15 A.M., and check in at the Admitting office.  Call this number if you have problems the morning of surgery:  [phone]   Remember:  Do not eat or drink after midnight the night before your surgery      Take these medicines the morning of surgery with A SIP OF WATER  acetaminophen  (TYLENOL )  ALPRAZolam  (XANAX )  levalbuterol (XOPENEX HFA)  inhaler  metoprolol  succinate (TOPROL -XL)  metaxalone (SKELAXIN)  pravastatin  (PRAVACHOL )  propranolol (INDERAL)   aspirin  LAST DOSE 05-30-24  As of today, STOP taking any Aspirin  (unless otherwise instructed by your surgeon) Aleve, Naproxen, Ibuprofen, Motrin, Advil, Goody's, BC's, all herbal medications, fish oil, and all vitamins. WHAT DO I DO ABOUT MY DIABETES MEDICATION?   Do not take oral diabetes medicines JARDIANCE  ( the morning of surgery.   The day of surgery, do not take other diabetes injectables, including Byetta (exenatide), Bydureon (exenatide ER), Victoza (liraglutide), or Trulicity (dulaglutide).  If your CBG is greater than 220 mg/dL, you may take  of your sliding scale (correction) dose of insulin .   HOW TO MANAGE YOUR DIABETES BEFORE AND AFTER  SURGERY  Why is it important to control my blood sugar before and after surgery? Improving blood sugar levels before and after surgery helps healing and can limit problems. A way of improving blood sugar control is eating a healthy diet by:  Eating less sugar and carbohydrates  Increasing activity/exercise  Talking with your doctor about reaching your blood sugar goals High blood sugars (greater than 180 mg/dL) can raise your risk of infections and slow your recovery, so you will need to focus on controlling your diabetes during the weeks before surgery. Make sure that the doctor who takes care of your diabetes knows about your planned surgery including the date and location.  How do I manage my blood sugar before surgery? Check your blood sugar at least 4 times a day, starting 2 days before surgery, to make sure that the level is not too high or low.  Check your blood sugar the morning of your surgery when you wake up and every 2 hours until you get to the Short Stay unit.  If your blood sugar is less than 70 mg/dL, you will need to treat for low blood sugar: Do not take insulin . Treat a low blood sugar (less than 70 mg/dL) with  cup of clear juice (cranberry or apple), 4 glucose tablets, OR glucose gel. Recheck blood sugar in 15 minutes after treatment (to make sure it is greater than 70 mg/dL). If your blood sugar is not greater than 70 mg/dL on recheck, call [phone] for further instructions. Report your blood sugar to the short  stay nurse when you get to Short Stay.  If you are admitted to the hospital after surgery: Your blood sugar will be checked by the staff and you will probably be given insulin  after surgery (instead of oral diabetes medicines) to make sure you have good blood sugar levels. The goal for blood sugar control after surgery is 80-180 mg/dL.                      Do not wear jewelry, make up, or nail polish            Do not wear lotions, powders,  perfumes/colognes, or deodorant.            Do not shave 48 hours prior to surgery.  Men may shave face and neck.            Do not bring valuables to the hospital.            [facility] is not responsible for any belongings or valuables.  Do NOT Smoke (Tobacco/Vaping) 24 hours prior to your procedure If you use a CPAP at night, you may bring all equipment for your overnight stay.   Contacts, glasses, dentures or bridgework may not be worn into surgery.      For patients admitted to the hospital, discharge time will be determined by your treatment team.   Patients discharged the day of surgery will not be allowed to drive home, and someone needs to stay with them for 24 hours.    Special instructions:   [facility]- Preparing For Surgery  Before surgery, you can play an important role. Because skin is not sterile, your skin needs to be as free of germs as possible. You can reduce the number of germs on your skin by washing with CHG (chlorahexidine gluconate) Soap before surgery.  CHG is an antiseptic cleaner which kills germs and bonds with the skin to continue killing germs even after washing.    Oral Hygiene is also important to reduce your risk of infection.  Remember - BRUSH YOUR TEETH THE MORNING OF SURGERY WITH YOUR REGULAR TOOTHPASTE  Please do not use if you have an allergy to CHG or antibacterial soaps. If your skin becomes reddened/irritated stop using the CHG.  Do not shave (including legs and underarms) for at least 48 hours prior to first CHG shower. It is OK to shave your face.  Please follow these instructions carefully.   Shower the NIGHT BEFORE SURGERY and the MORNING OF SURGERY with DIAL Soap.   Pat yourself dry with a CLEAN TOWEL.  Wear CLEAN PAJAMAS to bed the night before surgery  Place CLEAN SHEETS on your bed the night of your first shower and DO NOT SLEEP WITH PETS.   Day of Surgery: Please shower morning of surgery  Wear Clean/Comfortable clothing the  morning of surgery Do not apply any deodorants/lotions.   Remember to brush your teeth WITH YOUR REGULAR TOOTHPASTE.   Questions were answered. Patient verbalized understanding of instructions.

## 2024-06-03 ENCOUNTER — Ambulatory Visit (HOSPITAL_COMMUNITY)
Admission: RE | Admit: 2024-06-03 | Discharge: 2024-06-03 | Disposition: A | Discharge status: Home / Self Care | Attending: Pulmonary Disease | Admitting: Pulmonary Disease

## 2024-06-03 ENCOUNTER — Ambulatory Visit (HOSPITAL_COMMUNITY)

## 2024-06-03 ENCOUNTER — Encounter (HOSPITAL_COMMUNITY): Payer: Self-pay | Admitting: Pulmonary Disease

## 2024-06-03 ENCOUNTER — Ambulatory Visit (HOSPITAL_COMMUNITY): Admitting: Vascular Surgery

## 2024-06-03 ENCOUNTER — Ambulatory Visit (HOSPITAL_BASED_OUTPATIENT_CLINIC_OR_DEPARTMENT_OTHER): Admitting: Vascular Surgery

## 2024-06-03 ENCOUNTER — Encounter (HOSPITAL_COMMUNITY)
Admission: RE | Disposition: A | Discharge status: Home / Self Care | Payer: Self-pay | Source: Home / Self Care | Attending: Pulmonary Disease

## 2024-06-03 ENCOUNTER — Ambulatory Visit: Payer: Self-pay | Admitting: Pulmonary Disease

## 2024-06-03 DIAGNOSIS — F419 Anxiety disorder, unspecified: Secondary | ICD-10-CM | POA: Insufficient documentation

## 2024-06-03 DIAGNOSIS — G4733 Obstructive sleep apnea (adult) (pediatric): Secondary | ICD-10-CM | POA: Diagnosis not present

## 2024-06-03 DIAGNOSIS — Z87891 Personal history of nicotine dependence: Secondary | ICD-10-CM | POA: Diagnosis not present

## 2024-06-03 DIAGNOSIS — I7090 Unspecified atherosclerosis: Secondary | ICD-10-CM | POA: Diagnosis not present

## 2024-06-03 DIAGNOSIS — Z7984 Long term (current) use of oral hypoglycemic drugs: Secondary | ICD-10-CM | POA: Insufficient documentation

## 2024-06-03 DIAGNOSIS — E119 Type 2 diabetes mellitus without complications: Secondary | ICD-10-CM | POA: Insufficient documentation

## 2024-06-03 DIAGNOSIS — R911 Solitary pulmonary nodule: Secondary | ICD-10-CM

## 2024-06-03 DIAGNOSIS — Z79899 Other long term (current) drug therapy: Secondary | ICD-10-CM | POA: Diagnosis not present

## 2024-06-03 DIAGNOSIS — I10 Essential (primary) hypertension: Secondary | ICD-10-CM | POA: Diagnosis not present

## 2024-06-03 DIAGNOSIS — C342 Malignant neoplasm of middle lobe, bronchus or lung: Secondary | ICD-10-CM | POA: Insufficient documentation

## 2024-06-03 DIAGNOSIS — J449 Chronic obstructive pulmonary disease, unspecified: Secondary | ICD-10-CM | POA: Diagnosis not present

## 2024-06-03 DIAGNOSIS — E785 Hyperlipidemia, unspecified: Secondary | ICD-10-CM | POA: Diagnosis not present

## 2024-06-03 HISTORY — PX: ENDOBRONCHIAL ULTRASOUND: SHX5096

## 2024-06-03 HISTORY — PX: VIDEO BRONCHOSCOPY WITH ENDOBRONCHIAL NAVIGATION: SHX6175

## 2024-06-03 HISTORY — DX: Tachycardia, unspecified: R00.0

## 2024-06-03 LAB — CBC
HCT: 47.5 % — ABNORMAL HIGH (ref 36.0–46.0)
Hemoglobin: 16.3 g/dL — ABNORMAL HIGH (ref 12.0–15.0)
MCH: 32 pg (ref 26.0–34.0)
MCHC: 34.3 g/dL (ref 30.0–36.0)
MCV: 93.1 fL (ref 80.0–100.0)
Platelets: 119 K/uL — ABNORMAL LOW (ref 150–400)
RBC: 5.1 MIL/uL (ref 3.87–5.11)
RDW: 12.6 % (ref 11.5–15.5)
WBC: 8.3 K/uL (ref 4.0–10.5)
nRBC: 0 % (ref 0.0–0.2)

## 2024-06-03 LAB — BASIC METABOLIC PANEL WITH GFR
Anion gap: 9 (ref 5–15)
BUN: 17 mg/dL (ref 8–23)
CO2: 24 mmol/L (ref 22–32)
Calcium: 9 mg/dL (ref 8.9–10.3)
Chloride: 108 mmol/L (ref 98–111)
Creatinine, Ser: 0.86 mg/dL (ref 0.44–1.00)
GFR, Estimated: >60 mL/min (ref >60)
Glucose, Bld: 108 mg/dL — ABNORMAL HIGH (ref 70–99)
Potassium: 3.8 mmol/L (ref 3.5–5.1)
Sodium: 141 mmol/L (ref 135–145)

## 2024-06-03 LAB — GLUCOSE, CAPILLARY: Glucose-Capillary: 122 mg/dL — ABNORMAL HIGH (ref 70–99)

## 2024-06-03 SURGERY — VIDEO BRONCHOSCOPY WITH ENDOBRONCHIAL NAVIGATION
Anesthesia: General | Laterality: Right

## 2024-06-03 MED ORDER — ROCURONIUM BROMIDE 100 MG/10ML IV SOLN
INTRAVENOUS | Status: DC | PRN
  Administered 2024-06-03: 50 mg via INTRAVENOUS
  Administered 2024-06-03: 5 mg via INTRAVENOUS

## 2024-06-03 MED ORDER — LIDOCAINE 2% (20 MG/ML) 5 ML SYRINGE
INTRAMUSCULAR | Status: DC | PRN
  Administered 2024-06-03: 60 mg via INTRAVENOUS

## 2024-06-03 MED ORDER — MIDAZOLAM HCL 2 MG/2ML IJ SOLN
INTRAMUSCULAR | Status: AC
Start: 2024-06-03 — End: 2024-06-03
  Filled 2024-06-03: qty 2

## 2024-06-03 MED ORDER — ONDANSETRON HCL 4 MG/2ML IJ SOLN
INTRAMUSCULAR | Status: DC | PRN
  Administered 2024-06-03: 4 mg via INTRAVENOUS

## 2024-06-03 MED ORDER — PROPOFOL 10 MG/ML IV BOLUS
INTRAVENOUS | Status: DC | PRN
  Administered 2024-06-03: 120 mg via INTRAVENOUS
  Administered 2024-06-03: 20 mg via INTRAVENOUS
  Administered 2024-06-03: 30 ug via INTRAVENOUS

## 2024-06-03 MED ORDER — FENTANYL CITRATE (PF) 100 MCG/2ML IJ SOLN
INTRAMUSCULAR | Status: DC | PRN
  Administered 2024-06-03 (×2): 50 ug via INTRAVENOUS

## 2024-06-03 MED ORDER — PROPOFOL 500 MG/50ML IV EMUL
INTRAVENOUS | Status: DC | PRN
  Administered 2024-06-03: 150 ug/kg/min via INTRAVENOUS

## 2024-06-03 MED ORDER — FENTANYL CITRATE (PF) 100 MCG/2ML IJ SOLN
INTRAMUSCULAR | Status: AC
  Filled 2024-06-03: qty 2

## 2024-06-03 MED ORDER — DROPERIDOL 2.5 MG/ML IJ SOLN
0.6250 mg | Freq: Once | INTRAMUSCULAR | Status: DC | PRN
Start: 2024-06-03 — End: 2024-06-03

## 2024-06-03 MED ORDER — FENTANYL CITRATE (PF) 100 MCG/2ML IJ SOLN: 25.0000 ug | INTRAMUSCULAR | Status: DC | PRN

## 2024-06-03 MED ORDER — LACTATED RINGERS IV SOLN: INTRAVENOUS | Status: DC

## 2024-06-03 MED ORDER — SODIUM CHLORIDE 0.9 % IV SOLN: Freq: Once | INTRAVENOUS | Status: DC

## 2024-06-03 MED ORDER — CHLORHEXIDINE GLUCONATE 0.12 % MT SOLN
OROMUCOSAL | Status: AC
  Administered 2024-06-03: 15 mL
  Filled 2024-06-03: qty 15

## 2024-06-03 MED ORDER — SUGAMMADEX SODIUM 200 MG/2ML IV SOLN
INTRAVENOUS | Status: DC | PRN
Start: 2024-06-03 — End: 2024-06-03
  Administered 2024-06-03: 200 mg via INTRAVENOUS

## 2024-06-03 MED ORDER — MIDAZOLAM HCL 5 MG/5ML IJ SOLN
INTRAMUSCULAR | Status: DC | PRN
  Administered 2024-06-03 (×2): 1 mg via INTRAVENOUS

## 2024-06-03 NOTE — Op Note (Signed)
 Procedure Note  Patient: [name]  [employer] Cios mobile C-arm was utilized to identify and biopsy RML SPN.  Needle-in-lesion was confirmed using real-time Cios imaging, and images were uploaded to PACS.    Paula Southerly, MD [facility]

## 2024-06-03 NOTE — Op Note (Signed)
 Video Bronchoscopy with Endobronchial Ultrasound and Electromagnetic Navigation Procedure Note  Date of Operation: 06/03/2024  Pre-op Diagnosis: RML solitary pulmonary nodule  Post-op Diagnosis: Same  Surgeon: Paula Southerly, MD  Assistants: NA  Anesthesia: General endotracheal anesthesia  Operation: Flexible video fiberoptic bronchoscopy with endobronchial ultrasound, robotic assisted navigation and biopsies.  Estimated Blood Loss: less than 50   Complications: NONE  Indications and History: [name] is a 68 y.o. female with hx of nicotine dependence and RML nodule.  Recommendation was made to achieve a tissue diagnosis using endobronchial ultrasound and robotic assisted navigational bronchoscopy. The risks, benefits, complications, treatment options and expected outcomes were discussed with the patient.  The possibilities of pneumothorax, pneumonia, reaction to medication, pulmonary aspiration, perforation of a viscus, bleeding, failure to diagnose a condition and creating a complication requiring transfusion or operation were discussed with the patient who freely signed the consent.    Description of Procedure: The patient was seen in the Preoperative Area, was examined and was deemed appropriate to proceed.  The patient was taken to [facility] Endoscopy room 3, identified as [name] and the procedure verified as Flexible Video Fiberoptic Bronchoscopy with robotic assisted navigation and endobronchial ultrasound.  A Time Out was held and the above information confirmed.   Robotic assisted navigation: Prior to the date of the procedure a high-resolution CT scan of the chest was performed. Utilizing ION software program a virtual tracheobronchial tree was generated to allow the creation of distinct navigation pathways to the patient's parenchymal abnormalities. After being taken to the operating room general anesthesia was initiated and the patient  was orally intubated. The video  fiberoptic bronchoscope was introduced via the endotracheal tube and a general inspection was performed which showed normal right and left lung anatomy. Aspiration of the bilateral mainstems was completed to remove any remaining secretions. Robotic catheter inserted into patient's endotracheal tube.   Target #1 - RML nodule: The distinct navigation pathways prepared prior to this procedure were then utilized to navigate to patient's lesion identified on CT scan. The robotic catheter was secured into place and the vision probe was withdrawn.  Lesion location was approximated using fluoroscopy.  Local registration and targeting was performed using [employer] Cios mobile C-arm three-dimensional imaging. Under fluoroscopic guidance transbronchial needle biopsies, and transbronchial cryoprobe biopsies were performed to be sent for cytology and pathology.  Needle-in-lesion was confirmed using Cios mobile C-arm.  A bronchioalveolar lavage was performed in the RML Nodule and sent for cytology and microbiology.  Endobronchial ultrasound: The robotic scope was then withdrawn and the endobronchial ultrasound was used to identify and characterize the peritracheal, hilar and bronchial lymph nodes. Inspection showed < 5 mm 11 L, 4 L, 7, 4 R, and 15 mm 11Ri LN. Using real-time ultrasound guidance Wang needle biopsies were take from Station 11Ri nodes and were sent for cytology.   At the end of the procedure a general airway inspection was performed and there was no evidence of active bleeding. The bronchoscope was removed.  The patient tolerated the procedure well. There was no significant blood loss and there were no obvious complications. A post-procedural chest x-ray is pending.  Samples Target #1: 1. Transbronchial Wang needle biopsies from RML SPN 2. Transbronchial cryoprobe biopsies from RML SPN 3. Bronchoalveolar lavage from RML SPN  EBUS Samples: 1. Wang needle biopsies from 11Ri node, 5  passes.  Paula Southerly, MD [facility]

## 2024-06-03 NOTE — Interval H&P Note (Signed)
 History and Physical Interval Note:  06/03/2024 11:10 AM  [name]  has presented today for surgery, with the diagnosis of solitary pulmonary nodule.  The various methods of treatment have been discussed with the patient and family. After consideration of risks, benefits and other options for treatment, the patient has consented to  Procedure(s): VIDEO BRONCHOSCOPY WITH ENDOBRONCHIAL NAVIGATION (Right) ENDOBRONCHIAL ULTRASOUND (EBUS) (Bilateral) as a surgical intervention.  The patient's history has been reviewed, patient examined, no change in status, stable for surgery.  I have reviewed the patient's chart and labs.  Questions were answered to the patient's satisfaction.     Gurshaan Matsuoka

## 2024-06-03 NOTE — Anesthesia Procedure Notes (Signed)
 Procedure Name: Intubation Date/Time: 06/03/2024 1:36 PM  Performed by: Bess Josette ORN, CRNAPre-anesthesia Checklist: Patient identified, Emergency Drugs available, Suction available and Patient being monitored Patient Re-evaluated:Patient Re-evaluated prior to induction Oxygen Delivery Method: Circle system utilized Preoxygenation: Pre-oxygenation with 100% oxygen Induction Type: IV induction Ventilation: Mask ventilation without difficulty Laryngoscope Size: Glidescope Grade View: Grade I Tube size: 8.5 mm Number of attempts: 1 Airway Equipment and Method: Stylet Placement Confirmation: ETT inserted through vocal cords under direct vision, positive ETCO2 and breath sounds checked- equal and bilateral Secured at: 22 cm Tube secured with: Tape

## 2024-06-03 NOTE — Transfer of Care (Signed)
 Immediate Anesthesia Transfer of Care Note  Patient: [name]  Procedure(s) Performed: VIDEO BRONCHOSCOPY WITH ENDOBRONCHIAL NAVIGATION (Right) ENDOBRONCHIAL ULTRASOUND (EBUS) (Bilateral)  Patient Location: PACU  Anesthesia Type:General  Level of Consciousness: awake  Airway & Oxygen Therapy: Patient Spontanous Breathing  Post-op Assessment: Report given to RN  Post vital signs: stable  Last Vitals:  Vitals Value Taken Time  BP 100/88 06/03/24 14:29  Temp    Pulse 66 06/03/24 14:32  Resp 17 06/03/24 14:32  SpO2 99 % 06/03/24 14:32  Vitals shown include unfiled device data.  Last Pain:  Vitals:   06/03/24 1138  TempSrc:   PainSc: 4          Complications: No notable events documented.

## 2024-06-03 NOTE — Discharge Instructions (Signed)

## 2024-06-04 ENCOUNTER — Encounter (HOSPITAL_COMMUNITY): Payer: Self-pay | Admitting: Pulmonary Disease

## 2024-06-04 NOTE — Anesthesia Postprocedure Evaluation (Signed)
 Anesthesia Post Note  Patient: [name]  Procedure(s) Performed: VIDEO BRONCHOSCOPY WITH ENDOBRONCHIAL NAVIGATION (Right) ENDOBRONCHIAL ULTRASOUND (EBUS) (Bilateral)     Patient location during evaluation: PACU Anesthesia Type: General Level of consciousness: sedated and patient cooperative Pain management: pain level controlled Vital Signs Assessment: post-procedure vital signs reviewed and stable Respiratory status: spontaneous breathing Cardiovascular status: stable Anesthetic complications: no   No notable events documented.  Last Vitals:  Vitals:   06/03/24 1500 06/03/24 1510  BP: 110/65   Pulse: (!) 55 60  Resp: 13 11  Temp:    SpO2: 99% 94%    Last Pain:  Vitals:   06/03/24 1510  TempSrc:   PainSc: 0-No pain                 Norleen Pope

## 2024-06-07 LAB — CYTOLOGY - NON PAP

## 2024-06-08 ENCOUNTER — Ambulatory Visit: Payer: Self-pay | Admitting: Pulmonary Disease

## 2024-06-10 ENCOUNTER — Other Ambulatory Visit: Payer: Self-pay

## 2024-06-10 NOTE — Progress Notes (Signed)
 The proposed treatment discussed in conference is for discussion purpose only and is not a binding recommendation.  The patients have not been physically examined, or presented with their treatment options.  Therefore, final treatment plans cannot be decided.

## 2024-06-14 ENCOUNTER — Encounter: Payer: Self-pay | Admitting: Acute Care

## 2024-06-14 ENCOUNTER — Ambulatory Visit

## 2024-06-14 ENCOUNTER — Ambulatory Visit: Admitting: Acute Care

## 2024-06-14 VITALS — BP 109/63 | HR 63 | Temp 98.6°F | Ht 63.0 in | Wt 152.2 lb

## 2024-06-14 DIAGNOSIS — R059 Cough, unspecified: Secondary | ICD-10-CM | POA: Diagnosis not present

## 2024-06-14 DIAGNOSIS — Z9889 Other specified postprocedural states: Secondary | ICD-10-CM

## 2024-06-14 DIAGNOSIS — J449 Chronic obstructive pulmonary disease, unspecified: Secondary | ICD-10-CM

## 2024-06-14 DIAGNOSIS — C3490 Malignant neoplasm of unspecified part of unspecified bronchus or lung: Secondary | ICD-10-CM

## 2024-06-14 DIAGNOSIS — Z87891 Personal history of nicotine dependence: Secondary | ICD-10-CM | POA: Diagnosis not present

## 2024-06-14 DIAGNOSIS — C342 Malignant neoplasm of middle lobe, bronchus or lung: Secondary | ICD-10-CM

## 2024-06-14 DIAGNOSIS — R911 Solitary pulmonary nodule: Secondary | ICD-10-CM

## 2024-06-14 DIAGNOSIS — F17210 Nicotine dependence, cigarettes, uncomplicated: Secondary | ICD-10-CM | POA: Diagnosis not present

## 2024-06-14 DIAGNOSIS — R9389 Abnormal findings on diagnostic imaging of other specified body structures: Secondary | ICD-10-CM

## 2024-06-14 LAB — PULMONARY FUNCTION TEST
DL/VA % pred: 93 %
DL/VA: 3.96 ml/min/mmHg/L
DLCO unc % pred: 108 %
DLCO unc: 20.59 ml/min/mmHg
FEF 25-75 Pre: 2.04 L/s
FEF2575-%Pred-Pre: 100 %
FEV1-%Pred-Pre: 106 %
FEV1-Pre: 2.44 L
FEV1FVC-%Pred-Pre: 98 %
FEV6-%Pred-Pre: 110 %
FEV6-Pre: 3.18 L
FEV6FVC-%Pred-Pre: 103 %
FVC-%Pred-Pre: 107 %
FVC-Pre: 3.22 L
Pre FEV1/FVC ratio: 76 %
Pre FEV6/FVC Ratio: 100 %
RV % pred: 150 %
RV: 3.09 L
TLC % pred: 125 %
TLC: 6.14 L

## 2024-06-14 NOTE — Progress Notes (Signed)
 History of Present Illness [name] is a 68 y.o. female former smoker with PMH of DM, HTN, HLD referred to pulmonary for a Solitary Pulmonary Nodule.  She was seen for consult by  Dr. Catherine.    06/14/2024 Discussed the use of AI scribe software for clinical note transcription with the patient, who gave verbal consent to proceed.  History of Present Illness Pt. Presents for follow up after bronchoscopy with biopsies on 06/03/2024. She states she has done well since the procedure.   Minor bleeding occurred immediately after the procedure but resolved spontaneously. No worsening shortness of breath, fever, or discolored secretions post-procedure. Anesthesia was well tolerated.Last cigarette 05/22/2024. She has a 60 year pack smoking history  She underwent bronchoscopy with biopsies on 06/03/2024. She is here today to review the results, and for her post procedure evaluation.    We have discussed the results of her biopsy. It was positive for adenocarcinoma of the right middle lobe . The right middle lobe lavage showed atypical cells, and the 11 R lymph node was negative for malignancy, although had SUV of 3.1 on PET.   She has a significant smoking history, having smoked for approximately 40 years at a rate of 1.5 packs per day. She quit smoking on October 25th, 2025, with a brief relapse on a single day thereafter.  She was discussed in Thoracic Conference on 06/10/2024. Plan is for  Eval by surgery if PFTs are adequate. 11R LN negative on biopsy, but active on PET. If LN dissection confirms malignancy, treat with adjuvant chemotherapy  I have schedule PFT's for today at 4 pm. Pt. Was willing to drive to [facility] to expedite referral to thoracic surgery. I have referred to thoracic surgery. I have also ordered MRI of the brain to complete staging. I have also referred to radiation oncology in the event she is not a surgical candidate. Pt.and her husband both verbalized understanding of the  above.They had no further questions at completion of the OV today.   She has a history of a raspy voice and was referred  to see an ENT specialist. She understands she will get a call to get this scheduled.       Test Results: Cytology 06/03/2024 FINAL MICROSCOPIC DIAGNOSIS:  A. LUNG, RML, FINE NEEDLE ASPIRATION  BIOPSY:  - Positive for malignancy.  - Non-small cell carcinoma, favor adenocarcinoma.   Comment:  The carcinoma is positive for TTF-1 and negative for p40. Stain controls  worked appropriately.  Dr. Catherine was notified on 06/07/2024.  B. LUNG, RML, LAVAGE:    FINAL MICROSCOPIC DIAGNOSIS:  - Atypical cells present   C. LYMPH NODE, STATION 11R, FINE NEEDLE ASPIRATION:  - Negative for malignancy.  - Lymphoid tissue consistent with sampling a lymph node.     Latest Ref Rng & Units 06/03/2024   11:20 AM 07/08/2023    4:07 AM 07/07/2023   11:11 AM  CBC  WBC 4.0 - 10.5 K/uL 8.3  12.3  9.9   Hemoglobin 12.0 - 15.0 g/dL 83.6  82.7  84.1   Hematocrit 36.0 - 46.0 % 47.5  51.2  45.2   Platelets 150 - 400 K/uL 119  139  129        Latest Ref Rng & Units 06/03/2024   11:20 AM 07/08/2023    4:07 AM 07/07/2023   11:11 AM  BMP  Glucose 70 - 99 mg/dL 891  891  99   BUN 8 - 23 mg/dL 17  13  17   Creatinine 0.44 - 1.00 mg/dL 9.13  9.17  9.21   Sodium 135 - 145 mmol/L 141  137  139   Potassium 3.5 - 5.1 mmol/L 3.8  3.7  3.5   Chloride 98 - 111 mmol/L 108  97  106   CO2 22 - 32 mmol/L 24  31  25    Calcium 8.9 - 10.3 mg/dL 9.0  9.3  8.4     BNP No results found for: BNP  ProBNP No results found for: PROBNP  PFT No results found for: FEV1PRE, FEV1POST, FVCPRE, FVCPOST, TLC, DLCOUNC, PREFEV1FVCRT, PSTFEV1FVCRT  DG Chest Port 1 View Result Date: 06/03/2024 CLINICAL DATA:  Post bronchoscopy. EXAM: PORTABLE CHEST 1 VIEW COMPARISON:  Chest radiograph dated 02/28/2022. FINDINGS: No focal consolidation, pleural effusion, pneumothorax. The cardiac silhouette  is within limits. Atherosclerotic calcification of the aortic arch. No acute osseous pathology. IMPRESSION: No acute cardiopulmonary process. Electronically Signed   By: Vanetta Chou M.D.   On: 06/03/2024 15:02   DG C-ARM BRONCHOSCOPY Result Date: 06/03/2024 C-ARM BRONCHOSCOPY: Fluoroscopy was utilized by the requesting physician.  No radiographic interpretation.   CT SUPER D CHEST WO CONTRAST Result Date: 06/02/2024 CLINICAL DATA:  Solitary pulmonary nodule. Scheduled for lung biopsy. EXAM: CT CHEST WITHOUT CONTRAST TECHNIQUE: Multidetector CT imaging of the chest was performed using thin slice collimation for electromagnetic bronchoscopy planning purposes, without intravenous contrast. RADIATION DOSE REDUCTION: This exam was performed according to the departmental dose-optimization program which includes automated exposure control, adjustment of the mA and/or kV according to patient size and/or use of iterative reconstruction technique. COMPARISON:  Chest CT dated 04/14/2024 and PET CT dated 05/13/2024. FINDINGS: Evaluation of this exam is limited in the absence of intravenous contrast. Cardiovascular: There is no cardiomegaly or pericardial effusion. There is coronary vascular calcification. Mild atherosclerotic calcification of the thoracic aorta. No aneurysmal dilatation. The central pulmonary arteries are grossly unremarkable. Mediastinum/Nodes: No hilar or mediastinal adenopathy. The esophagus is grossly unremarkable. No mediastinal fluid collection. Lungs/Pleura: A 12 mm right middle lobe nodule as seen prior on the prior CT. Upper Abdomen: No acute abnormality. Musculoskeletal: Osteopenia with degenerative changes of the spine. No acute osseous pathology. IMPRESSION: 1. No acute intrathoracic pathology. 2. A 12 mm right middle lobe nodule. 3.  Aortic Atherosclerosis (ICD10-I70.0). Electronically Signed   By: Vanetta Chou M.D.   On: 06/02/2024 17:46     Past medical hx Past Medical  History:  Diagnosis Date   Diabetes mellitus without complication (HCC)    Hyperlipidemia    Hypertension    Tachycardia      Social History   Tobacco Use   Smoking status: Former    Current packs/day: 0.00    Average packs/day: 0.5 packs/day for 3.8 years (1.9 ttl pk-yrs)    Types: Cigarettes    Start date: 2022    Quit date: 05/22/2024    Years since quitting: 0.0   Smokeless tobacco: Never    Ms.Lahey reports that she quit smoking about 3 weeks ago. Her smoking use included cigarettes. She started smoking about 3 years ago. She has a 1.9 pack-year smoking history. She has never used smokeless tobacco.  Tobacco Cessation: Counseling given: Not Answered Former smoker with a 60 pack year smoking history. Quit about 25 days ago.  She has been counseled on smoking cessation x 3  minutes today.   Past surgical hx, Family hx, Social hx all reviewed.  Current Outpatient Medications on File Prior to Visit  Medication Sig  acetaminophen  (TYLENOL ) 500 MG tablet Take 1,000 mg by mouth every 6 (six) hours as needed for mild pain (pain score 1-3) or moderate pain (pain score 4-6).   ALPRAZolam  (XANAX ) 1 MG tablet Take 1 mg by mouth 3 (three) times daily as needed.   aspirin  EC 81 MG tablet Take 81 mg by mouth daily. Swallow whole.   bismuth subsalicylate (PEPTO BISMOL) 262 MG/15ML suspension Take 30 mLs by mouth every 6 (six) hours as needed for indigestion or diarrhea or loose stools.   HYDROcodone-acetaminophen  (NORCO) 10-325 MG tablet Take 1 tablet by mouth every 6 (six) hours as needed for moderate pain (pain score 4-6) or severe pain (pain score 7-10).   ibuprofen (ADVIL) 200 MG tablet Take 400-800 mg by mouth every 6 (six) hours as needed for mild pain (pain score 1-3) or moderate pain (pain score 4-6).   irbesartan  (AVAPRO ) 150 MG tablet Take 150 mg by mouth daily.   JARDIANCE 10 MG TABS tablet Take 10 mg by mouth daily.   levalbuterol (XOPENEX HFA) 45 MCG/ACT inhaler Inhale 1  puff into the lungs every 8 (eight) hours as needed for wheezing or shortness of breath.   metaxalone (SKELAXIN) 800 MG tablet Take 800 mg by mouth 3 (three) times daily as needed.   metoprolol  succinate (TOPROL -XL) 50 MG 24 hr tablet 1 TABLET ORALLY ONCE A DAY 90 DAYS   nicotine (NICODERM CQ - DOSED IN MG/24 HOURS) 21 mg/24hr patch Place 10.5 mg onto the skin daily. Cuts the patch   pravastatin  (PRAVACHOL ) 40 MG tablet Take 1 tablet by mouth daily.   propranolol (INDERAL) 10 MG tablet Take 10-20 mg by mouth daily as needed (tachacardia).   tinidazole (TINDAMAX) 500 MG tablet Take by mouth.   tiZANidine (ZANAFLEX) 2 MG tablet 1-2 tablets Orally 3 times a day; Duration: 30 days As needed for tight muscles   traZODone  (DESYREL ) 100 MG tablet Take 200 mg by mouth at bedtime.   calcium carbonate (TUMS - DOSED IN MG ELEMENTAL CALCIUM) 500 MG chewable tablet Chew 1 tablet by mouth daily as needed for indigestion or heartburn. (Patient not taking: Reported on 06/14/2024)   loperamide (IMODIUM A-D) 2 MG tablet Take 2 mg by mouth 4 (four) times daily as needed for diarrhea or loose stools. (Patient not taking: Reported on 06/14/2024)   ondansetron  (ZOFRAN -ODT) 4 MG disintegrating tablet Take 1 tablet (4 mg total) by mouth every 8 (eight) hours as needed for nausea or vomiting. (Patient not taking: Reported on 06/14/2024)   Potassium 99 MG TABS Take 2 tablets by mouth daily as needed. (Patient not taking: Reported on 06/14/2024)   No current facility-administered medications on file prior to visit.     Allergies  Allergen Reactions   Diltiazem Hcl Hives    Not sure if she has a problem with this medication.   Penicillins    Simvastatin Other (See Comments)    myalgia   Wellbutrin [Bupropion] Hives    Review Of Systems:  Constitutional:   No  weight loss, night sweats,  Fevers, chills, fatigue, or  lassitude.  HEENT:   No headaches,  Difficulty swallowing,  Tooth/dental problems, or  Sore  throat,                No sneezing, itching, ear ache, nasal congestion, post nasal drip,   CV:  No chest pain,  Orthopnea, PND, swelling in lower extremities, anasarca, dizziness, palpitations, syncope.   GI  No heartburn, indigestion, abdominal pain, nausea, vomiting,  diarrhea, change in bowel habits, loss of appetite, bloody stools.   Resp: + shortness of breath with exertion none  at rest.  No excess mucus, no productive cough,  + non-productive cough,  No coughing up of blood.  No change in color of mucus.  No wheezing.  No chest wall deformity  Skin: no rash or lesions.  GU: no dysuria, change in color of urine, no urgency or frequency.  No flank pain, no hematuria   MS:  No joint pain or swelling.  No decreased range of motion.  No back pain.  Psych:  No change in mood or affect. No depression or anxiety.  No memory loss.   Vital Signs BP 109/63   Pulse 63   Temp 98.6 F (37 C)   Ht 5' 3 (1.6 m)   Wt 152 lb 3.2 oz (69 kg)   LMP 10/26/1998 (Within Years) Comment: ~IN 40s  SpO2 98% Comment: ra  BMI 26.96 kg/m    Physical Exam:   Physical Exam GENERAL: No distress, alert and oriented times 3. EARS NOSE THROAT: No sinus tenderness, tympanic membranes clear, pale nasal mucosa, no oral exudate, no post nasal drip, no lymphadenopathy.Raspy voice CHEST: Lungs clear to auscultation bilaterally. No wheeze, rales, dullness, no accessory muscle use, no nasal flaring, no sternal retractions. CARDIAC: S1, S2, regular rate and rhythm, no murmur. ABDOMINAL: Soft, non tender. ND, BS present, EXTREMITIES: No clubbing, cyanosis, edema. No obvious deformities. NEUROLOGICAL: Normal strength. Alert and oriented x 3, MAE x 4. SKIN: No rashes, warm and dry. No obvious skin lesions. PSYCHIATRIC: Normal mood and behavior.   Assessment/Plan Assessment & Plan Adenocarcinoma of right middle lobe of lung PET scan showed lymph node uptake, but  biopsy negative. Surgical resection planned  pending pulmonary function.  MRI needed for staging.  Radiation therapy considered if surgery not feasible. - Scheduled PFTs today at 4 PM in [place]. - Ordered MRI of the brain for staging. - Referred to thoracic surgery for evaluation and potential resection. - Referred to radiation oncology if not a surgical candidate.  Tobacco use disorder She quit smoking with a brief relapse.  40-year history of 1.5 packs/day.  Aware of smoking's impact on surgery and motivated to remain smoke-free. - Continue to abstain from smoking to improve surgical candidacy. - Counseled x 3 minutes on smoking cessation    I spent 35 minutes dedicated to the care of this patient on the date of this encounter to include pre-visit review of records, face-to-face time with the patient discussing conditions above, post visit ordering of testing, clinical documentation with the electronic health record, making appropriate referrals as documented, and communicating necessary information to the patient's healthcare team.    Lauraine JULIANNA Lites, NP 06/14/2024  1:09 PM

## 2024-06-14 NOTE — Patient Instructions (Signed)
 Full PFT completed today without post.

## 2024-06-14 NOTE — Patient Instructions (Addendum)
 It is good to see you today. I am glad you have done well after the procedure.  We have reviewed you results , which did show non small lung cancer in the RML of the lung. This is  an adenocarcinoma of the lung . Plan is for referral to surgery and if PFT's are adequate, surgery . PFT's are scheduled for later today, in [place] at 4 pm.  We have ordered an MR Brain, and we have made referral to thoracic surgery.  You will get a call to get the thoracic surgery consult , and the MRI scheduled.  Good luck with treatment.  I will also refer you to radiation oncology in case you are not a surgical candidate.  The will take great care of you ar the cancer center.  Call us  if you need us .  Please contact office for sooner follow up if symptoms do not improve or worsen or seek emergency care

## 2024-06-14 NOTE — Progress Notes (Signed)
 Full PFT completed today without post.

## 2024-06-16 ENCOUNTER — Encounter: Payer: Self-pay | Admitting: Radiation Oncology

## 2024-06-16 ENCOUNTER — Ambulatory Visit
Admission: RE | Admit: 2024-06-16 | Discharge: 2024-06-16 | Disposition: A | Discharge status: Home / Self Care | Source: Ambulatory Visit | Attending: Radiation Oncology | Admitting: Radiation Oncology

## 2024-06-16 VITALS — BP 139/79 | HR 67 | Temp 97.3°F | Resp 18 | Ht 63.0 in | Wt 156.0 lb

## 2024-06-16 DIAGNOSIS — E119 Type 2 diabetes mellitus without complications: Secondary | ICD-10-CM | POA: Insufficient documentation

## 2024-06-16 DIAGNOSIS — I700 Atherosclerosis of aorta: Secondary | ICD-10-CM | POA: Insufficient documentation

## 2024-06-16 DIAGNOSIS — Z7982 Long term (current) use of aspirin: Secondary | ICD-10-CM | POA: Diagnosis not present

## 2024-06-16 DIAGNOSIS — Z79899 Other long term (current) drug therapy: Secondary | ICD-10-CM | POA: Diagnosis not present

## 2024-06-16 DIAGNOSIS — I10 Essential (primary) hypertension: Secondary | ICD-10-CM | POA: Insufficient documentation

## 2024-06-16 DIAGNOSIS — C342 Malignant neoplasm of middle lobe, bronchus or lung: Secondary | ICD-10-CM

## 2024-06-16 DIAGNOSIS — E785 Hyperlipidemia, unspecified: Secondary | ICD-10-CM | POA: Diagnosis not present

## 2024-06-16 DIAGNOSIS — R000 Tachycardia, unspecified: Secondary | ICD-10-CM | POA: Diagnosis not present

## 2024-06-16 DIAGNOSIS — Z87891 Personal history of nicotine dependence: Secondary | ICD-10-CM | POA: Insufficient documentation

## 2024-06-16 NOTE — Progress Notes (Signed)
 Location of tumor and Histology per Pathology Report: Right middle lobe  Biopsy:     Past/Anticipated interventions by surgeon, if any:    Past/Anticipated interventions by medical oncology/Pulmonolgy, if any: Lauraine Lites NP    Pain issues, if any:  yes, constant pain to abdomen and rectum.   SAFETY ISSUES: Prior radiation? no Pacemaker/ICD? no Possible current pregnancy? no Is the patient on methotrexate? no  Current Complaints / other details:   BP 139/79 (BP Location: Left Arm, Patient Position: Sitting)   Pulse 67   Temp (!) 97.3 F (36.3 C) (Temporal)   Resp 18   Ht 5' 3 (1.6 m)   Wt 156 lb (70.8 kg)   LMP 10/26/1998 (Within Years) Comment: ~IN 40s  SpO2 99%   BMI 27.63 kg/m

## 2024-06-16 NOTE — Progress Notes (Signed)
 Radiation Oncology         [phone] ________________________________  Initial Outpatient Consultation Note  Name: [name] MRN: [id number]  Date: 06/16/2024  DOB: July 16, 1956  RR:Mndd, Carlin Redbird, MD  Catherine Cools, MD   REFERRING PHYSICIAN: Catherine Cools, MD  DIAGNOSIS: The encounter diagnosis was Malignant neoplasm of middle lobe, bronchus or lung (HCC).  Non-small cell lung cancer favoring adenocarcinoma    HISTORY OF PRESENT ILLNESS::[name] is a 68 y.o. female who is accompanied by her husband. she is seen as a courtesy of Dr. Alghanim for an opinion concerning radiation therapy as part of management for her recently diagnosed lung cancer. Patient is a former smoker with a 60 pack year smoking history. Quit about 25 days ago.    The patient has a history of a lung nodule was first identified at age 25 during pregnancy, coinciding with a positive tuberculosis test and subsequent year-long treatment. The nodule was initially noted in 2019 as 8 millimeters in size and has grown to 15 by 8 millimeters as of September this year. Two PET scans, one in 2020, showed activity in the nodule and a lymph node in the right lung.   During a follow up with Dr. Catherine on 05/31/24, she complained of worsening voice hoarseness and unexplained weight loss. She then underwent a CT chest on 04/14/24 showed a 12 mm right middle lobe nodule. A restaging PET scan done on 05/13/24 showed a spiculated right middle lobe pulmonary nodule with mildly abnormally elevated maximum SUV of 2.7, concerning for potential malignancy. Scan also noted a small right lower hilar lymph node with mildly elevated maximum SUV of 3.1.    She then underwent a video bronchoscopy with biopsies on 06/03/2024 under the care of Dr. Catherine. Surgical pathology on right lung FNA  showed non-small cell carcinoma, favoring adenocarcinoma. Examined lymph nodes are negative for malignancy. Immunohistological stains are positive  for TTF-1 and negative for p40.   She was discussed in Thoracic Conference on 06/10/2024. Plan is for  Eval by surgery if PFTs are adequate. 11R LN negative on biopsy, but active on PET. If LN dissection confirms malignancy, treat with adjuvant chemotherapy.   During her most recent follow up with NP Groce on 06/14/24, they opted to proceed with further imaging for staging purposes and referrals for surgery and radiation oncology for evaluations.    Pulmonary function studies completed late yesterday afternoon.  PREVIOUS RADIATION THERAPY: No  PAST MEDICAL HISTORY:  Past Medical History:  Diagnosis Date   Diabetes mellitus without complication (HCC)    Hyperlipidemia    Hypertension    Tachycardia     PAST SURGICAL HISTORY: Past Surgical History:  Procedure Laterality Date   APPENDECTOMY     CESAREAN SECTION     ENDOBRONCHIAL ULTRASOUND Bilateral 06/03/2024   Procedure: ENDOBRONCHIAL ULTRASOUND (EBUS);  Surgeon: Catherine Cools, MD;  Location: [facility] ENDOSCOPY;  Service: Pulmonary;  Laterality: Bilateral;   VIDEO BRONCHOSCOPY WITH ENDOBRONCHIAL NAVIGATION Right 06/03/2024   Procedure: VIDEO BRONCHOSCOPY WITH ENDOBRONCHIAL NAVIGATION;  Surgeon: Catherine Cools, MD;  Location: [facility] ENDOSCOPY;  Service: Pulmonary;  Laterality: Right;    FAMILY HISTORY:  Family History  Problem Relation Age of Onset   Heart attack Mother    Coronary artery disease Father        bypass surgery   Heart block Brother    Breast cancer Paternal Aunt        @ unknown age   BRCA 1/2 Neg Hx  SOCIAL HISTORY:  Social History   Tobacco Use   Smoking status: Former    Current packs/day: 0.00    Average packs/day: 0.5 packs/day for 3.8 years (1.9 ttl pk-yrs)    Types: Cigarettes    Start date: 2022    Quit date: 05/22/2024    Years since quitting: 0.0   Smokeless tobacco: Never    ALLERGIES:  Allergies  Allergen Reactions   Diltiazem Hcl Hives    Not sure if she has a problem with this  medication.   Penicillins    Simvastatin Other (See Comments)    myalgia   Wellbutrin [Bupropion] Hives    MEDICATIONS:  Current Outpatient Medications  Medication Sig Dispense Refill   acetaminophen  (TYLENOL ) 500 MG tablet Take 1,000 mg by mouth every 6 (six) hours as needed for mild pain (pain score 1-3) or moderate pain (pain score 4-6).     ALPRAZolam  (XANAX ) 1 MG tablet Take 1 mg by mouth 3 (three) times daily as needed.     aspirin  EC 81 MG tablet Take 81 mg by mouth daily. Swallow whole.     bismuth subsalicylate (PEPTO BISMOL) 262 MG/15ML suspension Take 30 mLs by mouth every 6 (six) hours as needed for indigestion or diarrhea or loose stools.     calcium carbonate (TUMS - DOSED IN MG ELEMENTAL CALCIUM) 500 MG chewable tablet Chew 1 tablet by mouth daily as needed for indigestion or heartburn. (Patient not taking: Reported on 06/14/2024)     HYDROcodone-acetaminophen  (NORCO) 10-325 MG tablet Take 1 tablet by mouth every 6 (six) hours as needed for moderate pain (pain score 4-6) or severe pain (pain score 7-10).     ibuprofen (ADVIL) 200 MG tablet Take 400-800 mg by mouth every 6 (six) hours as needed for mild pain (pain score 1-3) or moderate pain (pain score 4-6).     irbesartan  (AVAPRO ) 150 MG tablet Take 150 mg by mouth daily.     JARDIANCE 10 MG TABS tablet Take 10 mg by mouth daily.     levalbuterol (XOPENEX HFA) 45 MCG/ACT inhaler Inhale 1 puff into the lungs every 8 (eight) hours as needed for wheezing or shortness of breath.     loperamide (IMODIUM A-D) 2 MG tablet Take 2 mg by mouth 4 (four) times daily as needed for diarrhea or loose stools. (Patient not taking: Reported on 06/14/2024)     metaxalone (SKELAXIN) 800 MG tablet Take 800 mg by mouth 3 (three) times daily as needed.     metoprolol  succinate (TOPROL -XL) 50 MG 24 hr tablet 1 TABLET ORALLY ONCE A DAY 90 DAYS 90 tablet 3   nicotine (NICODERM CQ - DOSED IN MG/24 HOURS) 21 mg/24hr patch Place 10.5 mg onto the skin  daily. Cuts the patch     ondansetron  (ZOFRAN -ODT) 4 MG disintegrating tablet Take 1 tablet (4 mg total) by mouth every 8 (eight) hours as needed for nausea or vomiting. (Patient not taking: Reported on 06/14/2024) 20 tablet 0   Potassium 99 MG TABS Take 2 tablets by mouth daily as needed. (Patient not taking: Reported on 06/14/2024)     pravastatin  (PRAVACHOL ) 40 MG tablet Take 1 tablet by mouth daily.     propranolol (INDERAL) 10 MG tablet Take 10-20 mg by mouth daily as needed (tachacardia).     tinidazole (TINDAMAX) 500 MG tablet Take by mouth.     tiZANidine (ZANAFLEX) 2 MG tablet 1-2 tablets Orally 3 times a day; Duration: 30 days As needed for tight muscles  traZODone  (DESYREL ) 100 MG tablet Take 200 mg by mouth at bedtime.     No current facility-administered medications for this encounter.    REVIEW OF SYSTEMS:  A 10+ POINT REVIEW OF SYSTEMS WAS OBTAINED including neurology, dermatology, psychiatry, cardiac, respiratory, lymph, extremities, GI, GU, musculoskeletal, constitutional, reproductive, HEENT.  She denies any pain within the chest area significant cough or hemoptysis.  She reports being able to ambulate up a flight of stairs without stopping.  She denies any visual changes headaches or confusion.   PHYSICAL EXAM:  height is 5' 3 (1.6 m) and weight is 156 lb (70.8 kg). Her temporal temperature is 97.3 F (36.3 C) (abnormal). Her blood pressure is 139/79 and her pulse is 67. Her respiration is 18 and oxygen saturation is 99%.   General: Alert and oriented, in no acute distress HEENT: Head is normocephalic. Extraocular movements are intact.  Neck: Neck is supple, no palpable cervical or supraclavicular lymphadenopathy. Heart: Regular in rate and rhythm with no murmurs, rubs, or gallops. Chest: Clear to auscultation bilaterally, with no rhonchi, wheezes, or rales. Abdomen: Soft, nontender, nondistended, with no rigidity or guarding. Extremities: No cyanosis or  edema. Lymphatics: see Neck Exam Skin: No concerning lesions. Musculoskeletal: symmetric strength and muscle tone throughout. Neurologic: Cranial nerves II through XII are grossly intact. No obvious focalities. Speech is fluent. Coordination is intact. Psychiatric: Judgment and insight are intact. Affect is appropriate.   ECOG = 1  0 - Asymptomatic (Fully active, able to carry on all predisease activities without restriction)  1 - Symptomatic but completely ambulatory (Restricted in physically strenuous activity but ambulatory and able to carry out work of a light or sedentary nature. For example, light housework, office work)  2 - Symptomatic, <50% in bed during the day (Ambulatory and capable of all self care but unable to carry out any work activities. Up and about more than 50% of waking hours)  3 - Symptomatic, >50% in bed, but not bedbound (Capable of only limited self-care, confined to bed or chair 50% or more of waking hours)  4 - Bedbound (Completely disabled. Cannot carry on any self-care. Totally confined to bed or chair)  5 - Death   Raylene MM, Creech RH, Tormey DC, et al. [phone]). Toxicity and response criteria of the [facility]. Am. DOROTHA Bridges. Oncol. 5 (6): 649-55  LABORATORY DATA:  Lab Results  Component Value Date   WBC 8.3 06/03/2024   HGB 16.3 (H) 06/03/2024   HCT 47.5 (H) 06/03/2024   MCV 93.1 06/03/2024   PLT 119 (L) 06/03/2024   NEUTROABS 5.3 02/28/2022   Lab Results  Component Value Date   NA 141 06/03/2024   K 3.8 06/03/2024   CL 108 06/03/2024   CO2 24 06/03/2024   GLUCOSE 108 (H) 06/03/2024   BUN 17 06/03/2024   CREATININE 0.86 06/03/2024   CALCIUM 9.0 06/03/2024      RADIOGRAPHY: DG Chest Port 1 View Result Date: 06/03/2024 CLINICAL DATA:  Post bronchoscopy. EXAM: PORTABLE CHEST 1 VIEW COMPARISON:  Chest radiograph dated 02/28/2022. FINDINGS: No focal consolidation, pleural effusion, pneumothorax. The cardiac silhouette is  within limits. Atherosclerotic calcification of the aortic arch. No acute osseous pathology. IMPRESSION: No acute cardiopulmonary process. Electronically Signed   By: Vanetta Chou M.D.   On: 06/03/2024 15:02   DG C-ARM BRONCHOSCOPY Result Date: 06/03/2024 C-ARM BRONCHOSCOPY: Fluoroscopy was utilized by the requesting physician.  No radiographic interpretation.   CT SUPER D CHEST WO CONTRAST Result Date:  06/02/2024 CLINICAL DATA:  Solitary pulmonary nodule. Scheduled for lung biopsy. EXAM: CT CHEST WITHOUT CONTRAST TECHNIQUE: Multidetector CT imaging of the chest was performed using thin slice collimation for electromagnetic bronchoscopy planning purposes, without intravenous contrast. RADIATION DOSE REDUCTION: This exam was performed according to the departmental dose-optimization program which includes automated exposure control, adjustment of the mA and/or kV according to patient size and/or use of iterative reconstruction technique. COMPARISON:  Chest CT dated 04/14/2024 and PET CT dated 05/13/2024. FINDINGS: Evaluation of this exam is limited in the absence of intravenous contrast. Cardiovascular: There is no cardiomegaly or pericardial effusion. There is coronary vascular calcification. Mild atherosclerotic calcification of the thoracic aorta. No aneurysmal dilatation. The central pulmonary arteries are grossly unremarkable. Mediastinum/Nodes: No hilar or mediastinal adenopathy. The esophagus is grossly unremarkable. No mediastinal fluid collection. Lungs/Pleura: A 12 mm right middle lobe nodule as seen prior on the prior CT. Upper Abdomen: No acute abnormality. Musculoskeletal: Osteopenia with degenerative changes of the spine. No acute osseous pathology. IMPRESSION: 1. No acute intrathoracic pathology. 2. A 12 mm right middle lobe nodule. 3.  Aortic Atherosclerosis (ICD10-I70.0). Electronically Signed   By: Vanetta Chou M.D.   On: 06/02/2024 17:46      IMPRESSION: Non-small cell lung cancer  presenting in the right middle lobe.  Today I reviewed the patient's biopsy findings and PET scan in detail.  We discussed that she has a biopsy-proven non-small cell lung cancer presenting in the right middle lobe.  PET scan shows mild activity in the right hilar lymph node which on biopsy returned negative.  She would be a good candidate for SBRT directed at the right middle lobe lesion.  Options for management of the right hilar lymph node would be to proceed with radiation treatment to this area as well or to follow closely on serial scans.    We discussed the natural history of non-small cell lung cancer and general treatment, highlighting the role of radiotherapy in the management.  We discussed the available radiation techniques, and focused on the details of logistics and delivery.  We reviewed the anticipated acute and late sequelae associated with radiation in this setting.  The patient was encouraged to ask questions that I answered to the best of my ability.   PLAN: She will meet with Dr. Kerrin in early December for cardiothoracic evaluation.  She will be evaluated for surgery.  If she is not a candidate for surgery or decides against surgery, I have asked her to call my office so we can schedule her for radiation treatment.  Contact information given to her today.   60 minutes of total time was spent for this patient encounter, including preparation, face-to-face counseling with the patient and coordination of care, physical exam, and documentation of the encounter.   ------------------------------------------------  Lynwood CHARM Nasuti, PhD, MD  This document serves as a record of services personally performed by Lynwood Nasuti, MD. It was created on his behalf by Reymundo Cartwright, a trained medical scribe. The creation of this record is based on the scribe's personal observations and the provider's statements to them. This document has been checked and approved by the attending  provider.

## 2024-06-16 NOTE — Progress Notes (Signed)
Pt notified on VM. 

## 2024-06-29 ENCOUNTER — Encounter: Payer: Self-pay | Admitting: Thoracic Surgery (Cardiothoracic Vascular Surgery)

## 2024-06-29 ENCOUNTER — Ambulatory Visit
Attending: Thoracic Surgery (Cardiothoracic Vascular Surgery) | Admitting: Thoracic Surgery (Cardiothoracic Vascular Surgery)

## 2024-06-29 VITALS — BP 111/70 | HR 59 | Resp 20 | Ht 63.0 in | Wt 153.2 lb

## 2024-06-29 DIAGNOSIS — C342 Malignant neoplasm of middle lobe, bronchus or lung: Secondary | ICD-10-CM

## 2024-06-29 NOTE — Progress Notes (Signed)
 [address] [place] [address]             [phone]     HPI: [name] sent for consultation regarding a right middle lobe non-small cell carcinoma.  [name] is a 68 year old woman with a history of tobacco abuse, positive tuberculosis test as a young woman, hypertension, hyperlipidemia, heart murmur, type 2 diabetes, and anxiety.  Has been followed for a right middle lobe lung nodule since about 2019.  Originally 8 mm.  Over time has slowly increased in size and developed a solid component.  Recently had a PET/CT which showed hypermetabolic activity in the nodule and also question of a right hilar node.  She underwent robotic bronchoscopy and endobronchial ultrasound by Dr. Catherine.  The nodule was positive for non-small cell carcinoma favor adeno.  There were lymphoid cells but no cancer cells from an 11R node.  She describes pain in the left side of her chest under her left shoulder blade when she coughs.  No exertional component.  She does have palpitations and has a history of paroxysmal SVT.  Occasionally has dizzy spells.  Complains of a productive cough.  No hemoptysis.  No recent weight loss.  She smoked about a pack and a half of cigarettes on average for 40 years prior to quitting in October (60-pack-year)  Zubrod Score: At the time of surgery this patient's most appropriate activity status/level should be described as: [x]     0    Normal activity, no symptoms []     1    Restricted in physical strenuous activity but ambulatory, able to do out light work []     2    Ambulatory and capable of self care, unable to do work activities, up and about >50 % of waking hours                              []     3    Only limited self care, in bed greater than 50% of waking hours []     4    Completely disabled, no self care, confined to bed or chair []     5    Moribund  Patient Active Problem List   Diagnosis Date Noted   Malignant neoplasm of middle  lobe, bronchus or lung (HCC) 06/16/2024   Solitary pulmonary nodule 05/31/2024   Shingles rash 07/08/2023   Abdominal pain 07/07/2023   Diverticular disease of colon 06/28/2023   OSA (obstructive sleep apnea) 07/10/2022   HYPOTENSION, ORTHOSTATIC 11/21/2009   SYNCOPE 11/21/2009   CHEST PAIN 11/21/2009   MIXED HYPERLIPIDEMIA 07/18/2008   Essential hypertension, benign 07/18/2008   PSVT 07/18/2008   PALPITATIONS 07/18/2008   Past Surgical History:  Procedure Laterality Date   APPENDECTOMY     CESAREAN SECTION     ENDOBRONCHIAL ULTRASOUND Bilateral 06/03/2024   Procedure: ENDOBRONCHIAL ULTRASOUND (EBUS);  Surgeon: Catherine Cools, MD;  Location: [facility] ENDOSCOPY;  Service: Pulmonary;  Laterality: Bilateral;   VIDEO BRONCHOSCOPY WITH ENDOBRONCHIAL NAVIGATION Right 06/03/2024   Procedure: VIDEO BRONCHOSCOPY WITH ENDOBRONCHIAL NAVIGATION;  Surgeon: Catherine Cools, MD;  Location: [facility] ENDOSCOPY;  Service: Pulmonary;  Laterality: Right;   Breast Cancer-related family history includes Breast cancer in her paternal aunt. There is no history of BRCA 1/2. Social History   Socioeconomic History   Marital status: Married    Spouse name: Not on file   Number of children: Not  on file   Years of education: Not on file   Highest education level: Not on file  Occupational History   Not on file  Tobacco Use   Smoking status: Former    Current packs/day: 0.00    Average packs/day: 0.5 packs/day for 3.8 years (1.9 ttl pk-yrs)    Types: Cigarettes    Start date: 2022    Quit date: 05/22/2024    Years since quitting: 0.1   Smokeless tobacco: Never  Substance and Sexual Activity   Alcohol use: Not on file   Drug use: Not on file   Sexual activity: Not on file  Other Topics Concern   Not on file  Social History Narrative   Not on file   Social Drivers of Health   Financial Resource Strain: Not on file  Food Insecurity: No Food Insecurity (06/16/2024)   Hunger Vital Sign    Worried About  Running Out of Food in the Last Year: Never true    Ran Out of Food in the Last Year: Never true  Transportation Needs: No Transportation Needs (06/16/2024)   PRAPARE - Administrator, Civil Service (Medical): No    Lack of Transportation (Non-Medical): No  Physical Activity: Not on file  Stress: Not on file  Social Connections: Not on file  Intimate Partner Violence: Not At Risk (06/16/2024)   Humiliation, Afraid, Rape, and Kick questionnaire    Fear of Current or Ex-Partner: No    Emotionally Abused: No    Physically Abused: No    Sexually Abused: No    Current Outpatient Medications  Medication Sig Dispense Refill   acetaminophen  (TYLENOL ) 500 MG tablet Take 1,000 mg by mouth every 6 (six) hours as needed for mild pain (pain score 1-3) or moderate pain (pain score 4-6).     ALPRAZolam  (XANAX ) 1 MG tablet Take 1 mg by mouth 3 (three) times daily as needed.     aspirin  EC 81 MG tablet Take 81 mg by mouth daily. Swallow whole.     bismuth subsalicylate (PEPTO BISMOL) 262 MG/15ML suspension Take 30 mLs by mouth every 6 (six) hours as needed for indigestion or diarrhea or loose stools.     calcium carbonate (TUMS - DOSED IN MG ELEMENTAL CALCIUM) 500 MG chewable tablet Chew 1 tablet by mouth daily as needed for indigestion or heartburn.     HYDROcodone-acetaminophen  (NORCO) 10-325 MG tablet Take 1 tablet by mouth every 6 (six) hours as needed for moderate pain (pain score 4-6) or severe pain (pain score 7-10).     ibuprofen (ADVIL) 200 MG tablet Take 400-800 mg by mouth every 6 (six) hours as needed for mild pain (pain score 1-3) or moderate pain (pain score 4-6).     irbesartan  (AVAPRO ) 150 MG tablet Take 150 mg by mouth daily.     JARDIANCE 10 MG TABS tablet Take 10 mg by mouth daily.     levalbuterol (XOPENEX HFA) 45 MCG/ACT inhaler Inhale 1 puff into the lungs every 8 (eight) hours as needed for wheezing or shortness of breath.     loperamide (IMODIUM A-D) 2 MG tablet Take 2  mg by mouth 4 (four) times daily as needed for diarrhea or loose stools.     metaxalone (SKELAXIN) 800 MG tablet Take 800 mg by mouth 3 (three) times daily as needed.     metoprolol  succinate (TOPROL -XL) 50 MG 24 hr tablet 1 TABLET ORALLY ONCE A DAY 90 DAYS 90 tablet 3   nicotine (  NICODERM CQ - DOSED IN MG/24 HOURS) 21 mg/24hr patch Place 10.5 mg onto the skin daily. Cuts the patch     ondansetron  (ZOFRAN -ODT) 4 MG disintegrating tablet Take 1 tablet (4 mg total) by mouth every 8 (eight) hours as needed for nausea or vomiting. 20 tablet 0   Potassium 99 MG TABS Take 2 tablets by mouth daily as needed.     pravastatin  (PRAVACHOL ) 40 MG tablet Take 1 tablet by mouth daily.     propranolol (INDERAL) 10 MG tablet Take 10-20 mg by mouth daily as needed (tachacardia).     tinidazole (TINDAMAX) 500 MG tablet Take by mouth.     tiZANidine (ZANAFLEX) 2 MG tablet 1-2 tablets Orally 3 times a day; Duration: 30 days As needed for tight muscles     traZODone  (DESYREL ) 100 MG tablet Take 200 mg by mouth at bedtime.     No current facility-administered medications for this visit.    Physical Exam Vitals reviewed.  Constitutional:      General: She is not in acute distress.    Appearance: Normal appearance.  HENT:     Head: Normocephalic and atraumatic.  Eyes:     General: No scleral icterus.    Extraocular Movements: Extraocular movements intact.  Cardiovascular:     Rate and Rhythm: Normal rate and regular rhythm.     Heart sounds: Murmur (Faint systolic murmur) heard.  Pulmonary:     Effort: No respiratory distress.     Breath sounds: Normal breath sounds. No wheezing or rales.  Abdominal:     General: There is no distension.     Palpations: Abdomen is soft.     Tenderness: There is no abdominal tenderness.  Musculoskeletal:     Right lower leg: No edema.     Left lower leg: No edema.  Lymphadenopathy:     Cervical: No cervical adenopathy.  Skin:    General: Skin is dry.  Neurological:      General: No focal deficit present.     Mental Status: She is alert and oriented to person, place, and time.     Cranial Nerves: No cranial nerve deficit.     Motor: No weakness.    Diagnostic Tests: PET AND CT SKULL BASE TO MID THIGH 05/13/2024 12:42:04 PM   TECHNIQUE:   RADIOPHARMACEUTICAL: 7.43 mCi F-18 FDG Uptake time 60 minutes. Glucose level 107 mg/dl.   PET imaging was acquired from the base of the skull to the mid thighs. Non-contrast enhanced computed tomography was obtained for attenuation correction and anatomic localization.   COMPARISON: None available.   CLINICAL HISTORY: Spiculated right middle lobe pulmonary nodule.   Blood pool maximum suv 2.4   FINDINGS:   HEAD AND NECK: Bilateral common carotid atheromatous vascular calcification.   CHEST: The 1.2 x 0.7 cm right middle lobe nodule of concern on image 35 series 7 has a maximum suv of 2.7, mildly abnormally elevated and relative to the size of the lesion, the appearance is concerning for potential malignancy. A small right lower hilar node has a maximum suv of 3.1 mildly above blood pool. Thoracic aortic, coronary artery, and branch vessel atheromatous vascular calcifications.   ABDOMEN AND PELVIS: Abdominal aortic atherosclerosis. Punctate nonobstructive left nephrolithiasis. Sigmoid colon diverticulosis.   BONES AND SOFT TISSUE: No abnormal FDG activity localizes to the bones. No metabolically active aggressive osseous lesion.   IMPRESSION: 1. Spiculated right middle lobe pulmonary nodule with mildly abnormally elevated maximum SUV of 2.7, concerning for potential  malignancy. 2. Small right lower hilar lymph node with mildly elevated maximum SUV of 3.1. 3. Sigmoid colon diverticulosis. 4. Atherosclerosis.   Electronically signed by: Ryan Salvage MD 05/13/2024 01:40 PM EDT RP Workstation: [id number] I personally reviewed the CT and PET/CT images.  There is a 12 x 8 mm mixed density  nodule in the right middle lobe.  Some activity in a right hilar node, but without definite adenopathy.  Pulmonary function testing 06/14/2024 FVC 3.22 (107%) FEV1 2.44 (106%) DLCO 20.59 (108%)  Impression: [name] is a 68 year old woman with a history of tobacco abuse, positive tuberculosis test as a young woman, hypertension, hyperlipidemia, heart murmur, type 2 diabetes, anxiety, and a newly diagnosed non-small cell carcinoma of the right middle lobe.  Non-small cell carcinoma right middle lobe, probable adenocarcinoma-clinical stage Ia (T1, N0) versus 2B (T1, N1) depending on whether or not there is a positive hilar node.  I suspect that it is just reactive and the endobronchial ultrasound was negative.  However we must be prepared for the possibility that it is positive.  Either way she still has surgically resectable disease.  Lymph node status will determine whether she needs any additional therapy.  We discussed treatment options for the nodule including stereotactic radiation versus surgical resection.  We discussed the advantages and disadvantages of each approach.  She strongly favors surgical resection.  She has adequate pulmonary reserve to tolerate resection.  The plan would be to do a right middle lobectomy.  It is possible depending on whether there is lymph node involvement and exactly where it is that she might require a middle and lower bilobectomy.  I think that is unlikely but she does have adequate function to tolerate that should it be necessary.  I described the postoperative procedure to [name] and her family.  I informed them of the general nature of the procedure including the need for general anesthesia, the incisions to be used, the use of the surgical robot, the possible need for conversion to an open procedure, the use of a drainage tube postoperatively, the expected hospital stay, and the overall recovery.  I informed them of the indications, risks, benefits,  and alternatives.  She understands the risks include, but not limited to death, MI, DVT, PE, bleeding, possible need for transfusion, infection, prolonged air leak, cardiac arrhythmias, chronic pain issues, as well as possibility of other unforeseeable complications.  She does wish to proceed.  She is scheduled to have a brain MRI on 07/18/2024.  Tobacco use-she has quit smoking.  She quit in late October.  Did have 1 breakdown where she smoked some cigarettes on a single day.  Overall doing well with that.  Plan: Will call patient to schedule robotic right middle lobectomy, possible bilobectomy after MRI brain on 07/18/2024  I spent over 45 minutes in review of records, images, and consultation with [name] today. [name], MD [facility] [phone]

## 2024-06-29 NOTE — H&P (View-Only) (Signed)
 [address] [place] [address]             [phone]     HPI: [name] sent for consultation regarding a right middle lobe non-small cell carcinoma.  [name] is a 68 year old woman with a history of tobacco abuse, positive tuberculosis test as a young woman, hypertension, hyperlipidemia, heart murmur, type 2 diabetes, and anxiety.  Has been followed for a right middle lobe lung nodule since about 2019.  Originally 8 mm.  Over time has slowly increased in size and developed a solid component.  Recently had a PET/CT which showed hypermetabolic activity in the nodule and also question of a right hilar node.  She underwent robotic bronchoscopy and endobronchial ultrasound by Dr. Catherine.  The nodule was positive for non-small cell carcinoma favor adeno.  There were lymphoid cells but no cancer cells from an 11R node.  She describes pain in the left side of her chest under her left shoulder blade when she coughs.  No exertional component.  She does have palpitations and has a history of paroxysmal SVT.  Occasionally has dizzy spells.  Complains of a productive cough.  No hemoptysis.  No recent weight loss.  She smoked about a pack and a half of cigarettes on average for 40 years prior to quitting in October (60-pack-year)  Zubrod Score: At the time of surgery this patient's most appropriate activity status/level should be described as: [x]     0    Normal activity, no symptoms []     1    Restricted in physical strenuous activity but ambulatory, able to do out light work []     2    Ambulatory and capable of self care, unable to do work activities, up and about >50 % of waking hours                              []     3    Only limited self care, in bed greater than 50% of waking hours []     4    Completely disabled, no self care, confined to bed or chair []     5    Moribund  Patient Active Problem List   Diagnosis Date Noted   Malignant neoplasm of middle  lobe, bronchus or lung (HCC) 06/16/2024   Solitary pulmonary nodule 05/31/2024   Shingles rash 07/08/2023   Abdominal pain 07/07/2023   Diverticular disease of colon 06/28/2023   OSA (obstructive sleep apnea) 07/10/2022   HYPOTENSION, ORTHOSTATIC 11/21/2009   SYNCOPE 11/21/2009   CHEST PAIN 11/21/2009   MIXED HYPERLIPIDEMIA 07/18/2008   Essential hypertension, benign 07/18/2008   PSVT 07/18/2008   PALPITATIONS 07/18/2008   Past Surgical History:  Procedure Laterality Date   APPENDECTOMY     CESAREAN SECTION     ENDOBRONCHIAL ULTRASOUND Bilateral 06/03/2024   Procedure: ENDOBRONCHIAL ULTRASOUND (EBUS);  Surgeon: Catherine Cools, MD;  Location: [facility] ENDOSCOPY;  Service: Pulmonary;  Laterality: Bilateral;   VIDEO BRONCHOSCOPY WITH ENDOBRONCHIAL NAVIGATION Right 06/03/2024   Procedure: VIDEO BRONCHOSCOPY WITH ENDOBRONCHIAL NAVIGATION;  Surgeon: Catherine Cools, MD;  Location: [facility] ENDOSCOPY;  Service: Pulmonary;  Laterality: Right;   Breast Cancer-related family history includes Breast cancer in her paternal aunt. There is no history of BRCA 1/2. Social History   Socioeconomic History   Marital status: Married    Spouse name: Not on file   Number of children: Not  on file   Years of education: Not on file   Highest education level: Not on file  Occupational History   Not on file  Tobacco Use   Smoking status: Former    Current packs/day: 0.00    Average packs/day: 0.5 packs/day for 3.8 years (1.9 ttl pk-yrs)    Types: Cigarettes    Start date: 2022    Quit date: 05/22/2024    Years since quitting: 0.1   Smokeless tobacco: Never  Substance and Sexual Activity   Alcohol use: Not on file   Drug use: Not on file   Sexual activity: Not on file  Other Topics Concern   Not on file  Social History Narrative   Not on file   Social Drivers of Health   Financial Resource Strain: Not on file  Food Insecurity: No Food Insecurity (06/16/2024)   Hunger Vital Sign    Worried About  Running Out of Food in the Last Year: Never true    Ran Out of Food in the Last Year: Never true  Transportation Needs: No Transportation Needs (06/16/2024)   PRAPARE - Administrator, Civil Service (Medical): No    Lack of Transportation (Non-Medical): No  Physical Activity: Not on file  Stress: Not on file  Social Connections: Not on file  Intimate Partner Violence: Not At Risk (06/16/2024)   Humiliation, Afraid, Rape, and Kick questionnaire    Fear of Current or Ex-Partner: No    Emotionally Abused: No    Physically Abused: No    Sexually Abused: No    Current Outpatient Medications  Medication Sig Dispense Refill   acetaminophen  (TYLENOL ) 500 MG tablet Take 1,000 mg by mouth every 6 (six) hours as needed for mild pain (pain score 1-3) or moderate pain (pain score 4-6).     ALPRAZolam  (XANAX ) 1 MG tablet Take 1 mg by mouth 3 (three) times daily as needed.     aspirin  EC 81 MG tablet Take 81 mg by mouth daily. Swallow whole.     bismuth subsalicylate (PEPTO BISMOL) 262 MG/15ML suspension Take 30 mLs by mouth every 6 (six) hours as needed for indigestion or diarrhea or loose stools.     calcium carbonate (TUMS - DOSED IN MG ELEMENTAL CALCIUM) 500 MG chewable tablet Chew 1 tablet by mouth daily as needed for indigestion or heartburn.     HYDROcodone-acetaminophen  (NORCO) 10-325 MG tablet Take 1 tablet by mouth every 6 (six) hours as needed for moderate pain (pain score 4-6) or severe pain (pain score 7-10).     ibuprofen (ADVIL) 200 MG tablet Take 400-800 mg by mouth every 6 (six) hours as needed for mild pain (pain score 1-3) or moderate pain (pain score 4-6).     irbesartan  (AVAPRO ) 150 MG tablet Take 150 mg by mouth daily.     JARDIANCE 10 MG TABS tablet Take 10 mg by mouth daily.     levalbuterol (XOPENEX HFA) 45 MCG/ACT inhaler Inhale 1 puff into the lungs every 8 (eight) hours as needed for wheezing or shortness of breath.     loperamide (IMODIUM A-D) 2 MG tablet Take 2  mg by mouth 4 (four) times daily as needed for diarrhea or loose stools.     metaxalone (SKELAXIN) 800 MG tablet Take 800 mg by mouth 3 (three) times daily as needed.     metoprolol  succinate (TOPROL -XL) 50 MG 24 hr tablet 1 TABLET ORALLY ONCE A DAY 90 DAYS 90 tablet 3   nicotine (  NICODERM CQ - DOSED IN MG/24 HOURS) 21 mg/24hr patch Place 10.5 mg onto the skin daily. Cuts the patch     ondansetron  (ZOFRAN -ODT) 4 MG disintegrating tablet Take 1 tablet (4 mg total) by mouth every 8 (eight) hours as needed for nausea or vomiting. 20 tablet 0   Potassium 99 MG TABS Take 2 tablets by mouth daily as needed.     pravastatin  (PRAVACHOL ) 40 MG tablet Take 1 tablet by mouth daily.     propranolol (INDERAL) 10 MG tablet Take 10-20 mg by mouth daily as needed (tachacardia).     tinidazole (TINDAMAX) 500 MG tablet Take by mouth.     tiZANidine (ZANAFLEX) 2 MG tablet 1-2 tablets Orally 3 times a day; Duration: 30 days As needed for tight muscles     traZODone  (DESYREL ) 100 MG tablet Take 200 mg by mouth at bedtime.     No current facility-administered medications for this visit.    Physical Exam Vitals reviewed.  Constitutional:      General: She is not in acute distress.    Appearance: Normal appearance.  HENT:     Head: Normocephalic and atraumatic.  Eyes:     General: No scleral icterus.    Extraocular Movements: Extraocular movements intact.  Cardiovascular:     Rate and Rhythm: Normal rate and regular rhythm.     Heart sounds: Murmur (Faint systolic murmur) heard.  Pulmonary:     Effort: No respiratory distress.     Breath sounds: Normal breath sounds. No wheezing or rales.  Abdominal:     General: There is no distension.     Palpations: Abdomen is soft.     Tenderness: There is no abdominal tenderness.  Musculoskeletal:     Right lower leg: No edema.     Left lower leg: No edema.  Lymphadenopathy:     Cervical: No cervical adenopathy.  Skin:    General: Skin is dry.  Neurological:      General: No focal deficit present.     Mental Status: She is alert and oriented to person, place, and time.     Cranial Nerves: No cranial nerve deficit.     Motor: No weakness.    Diagnostic Tests: PET AND CT SKULL BASE TO MID THIGH 05/13/2024 12:42:04 PM   TECHNIQUE:   RADIOPHARMACEUTICAL: 7.43 mCi F-18 FDG Uptake time 60 minutes. Glucose level 107 mg/dl.   PET imaging was acquired from the base of the skull to the mid thighs. Non-contrast enhanced computed tomography was obtained for attenuation correction and anatomic localization.   COMPARISON: None available.   CLINICAL HISTORY: Spiculated right middle lobe pulmonary nodule.   Blood pool maximum suv 2.4   FINDINGS:   HEAD AND NECK: Bilateral common carotid atheromatous vascular calcification.   CHEST: The 1.2 x 0.7 cm right middle lobe nodule of concern on image 35 series 7 has a maximum suv of 2.7, mildly abnormally elevated and relative to the size of the lesion, the appearance is concerning for potential malignancy. A small right lower hilar node has a maximum suv of 3.1 mildly above blood pool. Thoracic aortic, coronary artery, and branch vessel atheromatous vascular calcifications.   ABDOMEN AND PELVIS: Abdominal aortic atherosclerosis. Punctate nonobstructive left nephrolithiasis. Sigmoid colon diverticulosis.   BONES AND SOFT TISSUE: No abnormal FDG activity localizes to the bones. No metabolically active aggressive osseous lesion.   IMPRESSION: 1. Spiculated right middle lobe pulmonary nodule with mildly abnormally elevated maximum SUV of 2.7, concerning for potential  malignancy. 2. Small right lower hilar lymph node with mildly elevated maximum SUV of 3.1. 3. Sigmoid colon diverticulosis. 4. Atherosclerosis.   Electronically signed by: Ryan Salvage MD 05/13/2024 01:40 PM EDT RP Workstation: [id number] I personally reviewed the CT and PET/CT images.  There is a 12 x 8 mm mixed density  nodule in the right middle lobe.  Some activity in a right hilar node, but without definite adenopathy.  Pulmonary function testing 06/14/2024 FVC 3.22 (107%) FEV1 2.44 (106%) DLCO 20.59 (108%)  Impression: [name] is a 68 year old woman with a history of tobacco abuse, positive tuberculosis test as a young woman, hypertension, hyperlipidemia, heart murmur, type 2 diabetes, anxiety, and a newly diagnosed non-small cell carcinoma of the right middle lobe.  Non-small cell carcinoma right middle lobe, probable adenocarcinoma-clinical stage Ia (T1, N0) versus 2B (T1, N1) depending on whether or not there is a positive hilar node.  I suspect that it is just reactive and the endobronchial ultrasound was negative.  However we must be prepared for the possibility that it is positive.  Either way she still has surgically resectable disease.  Lymph node status will determine whether she needs any additional therapy.  We discussed treatment options for the nodule including stereotactic radiation versus surgical resection.  We discussed the advantages and disadvantages of each approach.  She strongly favors surgical resection.  She has adequate pulmonary reserve to tolerate resection.  The plan would be to do a right middle lobectomy.  It is possible depending on whether there is lymph node involvement and exactly where it is that she might require a middle and lower bilobectomy.  I think that is unlikely but she does have adequate function to tolerate that should it be necessary.  I described the postoperative procedure to Mrs. Bensman and her family.  I informed them of the general nature of the procedure including the need for general anesthesia, the incisions to be used, the use of the surgical robot, the possible need for conversion to an open procedure, the use of a drainage tube postoperatively, the expected hospital stay, and the overall recovery.  I informed them of the indications, risks, benefits,  and alternatives.  She understands the risks include, but not limited to death, MI, DVT, PE, bleeding, possible need for transfusion, infection, prolonged air leak, cardiac arrhythmias, chronic pain issues, as well as possibility of other unforeseeable complications.  She does wish to proceed.  She is scheduled to have a brain MRI on 07/18/2024.  Tobacco use-she has quit smoking.  She quit in late October.  Did have 1 breakdown where she smoked some cigarettes on a single day.  Overall doing well with that.  Plan: Will call patient to schedule robotic right middle lobectomy, possible bilobectomy after MRI brain on 07/18/2024  I spent over 45 minutes in review of records, images, and consultation with Mrs. Daring today. [name], MD [facility] [phone]

## 2024-06-30 ENCOUNTER — Encounter: Payer: Self-pay | Admitting: *Deleted

## 2024-06-30 ENCOUNTER — Other Ambulatory Visit: Payer: Self-pay | Admitting: *Deleted

## 2024-06-30 DIAGNOSIS — C342 Malignant neoplasm of middle lobe, bronchus or lung: Secondary | ICD-10-CM

## 2024-06-30 NOTE — Progress Notes (Unsigned)
 Established Patient Pulmonology Office Visit   Subjective:  Patient ID: [name], female    DOB: 07-09-1956  MRN: [id number]  CC: No chief complaint on file.   HPI  [name] is a 68 y.o. female with DM, HTN, HLD who presents for follow up in the setting of RML Solitary Pulmonary Nodule now found to be stage I NSCLC (adenocarcinoma).    {PULM QUESTIONNAIRES (Optional):33196}  ROS  {History (Optional):23778}  Current Outpatient Medications:    acetaminophen  (TYLENOL ) 500 MG tablet, Take 1,000 mg by mouth every 6 (six) hours as needed for mild pain (pain score 1-3) or moderate pain (pain score 4-6)., Disp: , Rfl:    ALPRAZolam  (XANAX ) 1 MG tablet, Take 1 mg by mouth 3 (three) times daily as needed., Disp: , Rfl:    aspirin  EC 81 MG tablet, Take 81 mg by mouth daily. Swallow whole., Disp: , Rfl:    bismuth subsalicylate (PEPTO BISMOL) 262 MG/15ML suspension, Take 30 mLs by mouth every 6 (six) hours as needed for indigestion or diarrhea or loose stools., Disp: , Rfl:    calcium carbonate (TUMS - DOSED IN MG ELEMENTAL CALCIUM) 500 MG chewable tablet, Chew 1 tablet by mouth daily as needed for indigestion or heartburn., Disp: , Rfl:    HYDROcodone-acetaminophen  (NORCO) 10-325 MG tablet, Take 1 tablet by mouth every 6 (six) hours as needed for moderate pain (pain score 4-6) or severe pain (pain score 7-10)., Disp: , Rfl:    ibuprofen (ADVIL) 200 MG tablet, Take 400-800 mg by mouth every 6 (six) hours as needed for mild pain (pain score 1-3) or moderate pain (pain score 4-6)., Disp: , Rfl:    irbesartan  (AVAPRO ) 150 MG tablet, Take 150 mg by mouth daily., Disp: , Rfl:    JARDIANCE 10 MG TABS tablet, Take 10 mg by mouth daily., Disp: , Rfl:    levalbuterol (XOPENEX HFA) 45 MCG/ACT inhaler, Inhale 1 puff into the lungs every 8 (eight) hours as needed for wheezing or shortness of breath., Disp: , Rfl:    loperamide (IMODIUM A-D) 2 MG tablet, Take 2 mg by mouth 4 (four) times daily as  needed for diarrhea or loose stools., Disp: , Rfl:    metaxalone (SKELAXIN) 800 MG tablet, Take 800 mg by mouth 3 (three) times daily as needed., Disp: , Rfl:    metoprolol  succinate (TOPROL -XL) 50 MG 24 hr tablet, 1 TABLET ORALLY ONCE A DAY 90 DAYS, Disp: 90 tablet, Rfl: 3   nicotine (NICODERM CQ - DOSED IN MG/24 HOURS) 21 mg/24hr patch, Place 10.5 mg onto the skin daily. Cuts the patch, Disp: , Rfl:    ondansetron  (ZOFRAN -ODT) 4 MG disintegrating tablet, Take 1 tablet (4 mg total) by mouth every 8 (eight) hours as needed for nausea or vomiting., Disp: 20 tablet, Rfl: 0   Potassium 99 MG TABS, Take 2 tablets by mouth daily as needed., Disp: , Rfl:    pravastatin  (PRAVACHOL ) 40 MG tablet, Take 1 tablet by mouth daily., Disp: , Rfl:    propranolol (INDERAL) 10 MG tablet, Take 10-20 mg by mouth daily as needed (tachacardia)., Disp: , Rfl:    tinidazole (TINDAMAX) 500 MG tablet, Take by mouth., Disp: , Rfl:    tiZANidine (ZANAFLEX) 2 MG tablet, 1-2 tablets Orally 3 times a day; Duration: 30 days As needed for tight muscles, Disp: , Rfl:    traZODone  (DESYREL ) 100 MG tablet, Take 200 mg by mouth at bedtime., Disp: , Rfl:  Objective:  LMP 10/26/1998 (Within Years) Comment: ~IN 40s {Pulm Vitals (Optional):32837}  Physical Exam   Diagnostic Review:  {Labs (Optional):32838}  CT Chest 04/14/2024: IMPRESSION: 1. Spiculated pulmonary nodule in the right middle lobe with pleural retraction, demonstrating slow interval growth since 2019, consistent with an indolent bronchogenic neoplasm. 2. Scattered bilateral upper lobe peribronchial ground glass pulmonary infiltrate, progressive since prior examination, likely reflecting progressive changes of respiratory bronchiolitis/smoking related lung disease or hypersensitivity pneumonitis.   PET/CT 05/13/2024: IMPRESSION: 1. Spiculated right middle lobe pulmonary nodule with mildly abnormally elevated maximum SUV of 2.7, concerning for potential  malignancy. 2. Small right lower hilar lymph node with mildly elevated maximum SUV of 3.1. 3. Sigmoid colon diverticulosis. 4. Atherosclerosis.  PFTs: relatively normal, RV and TLC elevated suggestive of hyperinflation  FINAL MICROSCOPIC DIAGNOSIS:  C. LYMPH NODE, STATION 11R, FINE NEEDLE ASPIRATION:  - Negative for malignancy.  - Lymphoid tissue consistent with sampling a lymph node.   FINAL MICROSCOPIC DIAGNOSIS:  A. LUNG, RML, FINE NEEDLE ASPIRATION  BIOPSY:  - Positive for malignancy.  - Non-small cell carcinoma, favor adenocarcinoma.     Assessment & Plan:   Assessment & Plan   No orders of the defined types were placed in this encounter.     No follow-ups on file.   Quinton Voth, MD

## 2024-07-01 ENCOUNTER — Encounter (HOSPITAL_BASED_OUTPATIENT_CLINIC_OR_DEPARTMENT_OTHER): Payer: Self-pay | Admitting: Pulmonary Disease

## 2024-07-01 ENCOUNTER — Encounter (HOSPITAL_BASED_OUTPATIENT_CLINIC_OR_DEPARTMENT_OTHER)

## 2024-07-01 ENCOUNTER — Ambulatory Visit (INDEPENDENT_AMBULATORY_CARE_PROVIDER_SITE_OTHER): Admitting: Pulmonary Disease

## 2024-07-01 VITALS — BP 107/70 | HR 67 | Ht 63.0 in | Wt 152.0 lb

## 2024-07-01 DIAGNOSIS — C3491 Malignant neoplasm of unspecified part of right bronchus or lung: Secondary | ICD-10-CM

## 2024-07-01 NOTE — Patient Instructions (Addendum)
  VISIT SUMMARY: You came in for a pre-operative evaluation for your upcoming lung surgery scheduled on July 26, 2024. We discussed your lung nodule, the planned brain scan, and your overall health in preparation for the surgery.  YOUR PLAN: Stage I lung cancer. Surgery is planned to potentially cure this. -Proceed with the scheduled lung surgery on December 29th. -Complete the brain scan on December 21st to check for any spread of the cancer. -During surgery, we will evaluate the lymph node and consider additional treatments if it is positive. -Avoid strenuous activities, such as mowing the lawn, after the surgery.  Contains text generated by Abridge.

## 2024-07-04 NOTE — Progress Notes (Unsigned)
  Electrophysiology Office Follow up Visit Note:    Date:  07/06/2024   ID:  [name], DOB [birth year], MRN [id number]  PCP:  Okey Carlin Redbird, MD  [facility] Cardiologist:  None  [facility] Electrophysiologist:  OLE ONEIDA HOLTS, MD    Interval History:     [name] is a 68 y.o. female who presents for a follow up visit.   The patient was last seen by [facility] July 10, 2022.  The patient has a history of hypertension, hyperlipidemia, SVT and atrial fibrillation.  The patient takes metoprolol  which seems to help control her arrhythmias and symptoms.  She is doing okay today.  She is with her husband today in clinic.  She has a lung surgery/lobectomy scheduled for later this month for the lung cancer.      Past medical, surgical, social and family history were reviewed.  ROS:   Please see the history of present illness.    All other systems reviewed and are negative.  EKGs/Labs/Other Studies Reviewed:    The following studies were reviewed today:          Physical Exam:    VS:  Ht 5' 3 (1.6 m)   Wt 157 lb (71.2 kg)   LMP 10/26/1998 (Within Years) Comment: ~IN 40s  BMI 27.81 kg/m     Wt Readings from Last 3 Encounters:  07/06/24 157 lb (71.2 kg)  07/01/24 152 lb (68.9 kg)  06/29/24 153 lb 3.2 oz (69.5 kg)     GEN: no distress CARD: RRR, No MRG RESP: No IWOB. CTAB.      ASSESSMENT:    1. PVC (premature ventricular contraction)   2. NSVT (nonsustained ventricular tachycardia) (HCC)   3. Primary hypertension    PLAN:    In order of problems listed above:  #SVT #NSVT #PVCs Good control on metoprolol .  Continue this for now  #Hypertension At goal today.  Recommend checking blood pressures 1-2 times per week at home and recording the values.  Recommend bringing these recordings to the primary care physician.   I discussed my upcoming departure from [name] during today's clinic appointment.  The patient will continue to  follow-up with one of my EP partners moving forward.  Follow-up with Dr. Nishan in 1 year.  Follow-up with EP on an as-needed basis   Signed, Ole Holts, MD, [facility], [facility] 07/06/2024 10:15 AM    Electrophysiology [facility]

## 2024-07-06 ENCOUNTER — Encounter: Payer: Self-pay | Admitting: Cardiology

## 2024-07-06 ENCOUNTER — Ambulatory Visit: Attending: Cardiology | Admitting: Cardiology

## 2024-07-06 VITALS — BP 110/66 | HR 61 | Ht 63.0 in | Wt 157.0 lb

## 2024-07-06 DIAGNOSIS — I4729 Other ventricular tachycardia: Secondary | ICD-10-CM

## 2024-07-06 DIAGNOSIS — I10 Essential (primary) hypertension: Secondary | ICD-10-CM

## 2024-07-06 DIAGNOSIS — I493 Ventricular premature depolarization: Secondary | ICD-10-CM | POA: Diagnosis not present

## 2024-07-06 NOTE — Patient Instructions (Signed)
 Medication Instructions:  Your physician recommends that you continue on your current medications as directed. Please refer to the Current Medication list given to you today.  *If you need a refill on your cardiac medications before your next appointment, please call your pharmacy*  Lab Work: None ordered  Testing/Procedures: None ordered  Follow-Up: At [facility], you and your health needs are our priority.  As part of our continuing mission to provide you with exceptional heart care, our providers are all part of one team.  This team includes your primary Cardiologist (physician) and Advanced Practice Providers or APPs (Physician Assistants and Nurse Practitioners) who all work together to provide you with the care you need, when you need it.  Your next appointment:   1 year(s)  Provider:   Dr. Delford   We recommend signing up for the patient portal called MyChart.  Sign up information is provided on this After Visit Summary.  MyChart is used to connect with patients for Virtual Visits (Telemedicine).  Patients are able to view lab/test results, encounter notes, upcoming appointments, etc.  Non-urgent messages can be sent to your provider as well.   To learn more about what you can do with MyChart, go to [email or web address].   Thank you for choosing [facility]!!   [phone]

## 2024-07-18 ENCOUNTER — Inpatient Hospital Stay: Admission: RE | Admit: 2024-07-18 | Discharge: 2024-07-18 | Attending: Acute Care | Admitting: Acute Care

## 2024-07-18 DIAGNOSIS — C3490 Malignant neoplasm of unspecified part of unspecified bronchus or lung: Secondary | ICD-10-CM

## 2024-07-18 MED ORDER — GADOPICLENOL 0.5 MMOL/ML IV SOLN
7.0000 mL | Freq: Once | INTRAVENOUS | Status: AC | PRN
  Administered 2024-07-18: 7 mL via INTRAVENOUS

## 2024-07-20 ENCOUNTER — Other Ambulatory Visit: Payer: Self-pay

## 2024-07-20 ENCOUNTER — Encounter (HOSPITAL_COMMUNITY): Payer: Self-pay

## 2024-07-20 ENCOUNTER — Encounter (HOSPITAL_COMMUNITY)
Admission: RE | Admit: 2024-07-20 | Discharge: 2024-07-20 | Disposition: A | Discharge status: Home / Self Care | Source: Ambulatory Visit | Attending: Thoracic Surgery (Cardiothoracic Vascular Surgery) | Admitting: Thoracic Surgery (Cardiothoracic Vascular Surgery)

## 2024-07-20 VITALS — BP 142/70 | HR 62 | Temp 98.0°F | Resp 18 | Ht 63.0 in | Wt 154.9 lb

## 2024-07-20 DIAGNOSIS — Z8611 Personal history of tuberculosis: Secondary | ICD-10-CM | POA: Insufficient documentation

## 2024-07-20 DIAGNOSIS — I471 Supraventricular tachycardia, unspecified: Secondary | ICD-10-CM | POA: Insufficient documentation

## 2024-07-20 DIAGNOSIS — Z87891 Personal history of nicotine dependence: Secondary | ICD-10-CM | POA: Diagnosis not present

## 2024-07-20 DIAGNOSIS — E785 Hyperlipidemia, unspecified: Secondary | ICD-10-CM | POA: Insufficient documentation

## 2024-07-20 DIAGNOSIS — I10 Essential (primary) hypertension: Secondary | ICD-10-CM | POA: Diagnosis not present

## 2024-07-20 DIAGNOSIS — C342 Malignant neoplasm of middle lobe, bronchus or lung: Secondary | ICD-10-CM | POA: Diagnosis not present

## 2024-07-20 DIAGNOSIS — I493 Ventricular premature depolarization: Secondary | ICD-10-CM | POA: Diagnosis not present

## 2024-07-20 DIAGNOSIS — Z7984 Long term (current) use of oral hypoglycemic drugs: Secondary | ICD-10-CM | POA: Diagnosis not present

## 2024-07-20 DIAGNOSIS — I472 Ventricular tachycardia, unspecified: Secondary | ICD-10-CM | POA: Insufficient documentation

## 2024-07-20 DIAGNOSIS — Z79899 Other long term (current) drug therapy: Secondary | ICD-10-CM | POA: Diagnosis not present

## 2024-07-20 DIAGNOSIS — E119 Type 2 diabetes mellitus without complications: Secondary | ICD-10-CM | POA: Insufficient documentation

## 2024-07-20 DIAGNOSIS — Z01818 Encounter for other preprocedural examination: Secondary | ICD-10-CM | POA: Diagnosis present

## 2024-07-20 DIAGNOSIS — G4733 Obstructive sleep apnea (adult) (pediatric): Secondary | ICD-10-CM | POA: Insufficient documentation

## 2024-07-20 HISTORY — DX: Sleep apnea, unspecified: G47.30

## 2024-07-20 HISTORY — DX: Anxiety disorder, unspecified: F41.9

## 2024-07-20 HISTORY — DX: Depression, unspecified: F32.A

## 2024-07-20 HISTORY — DX: Cardiac arrhythmia, unspecified: I49.9

## 2024-07-20 HISTORY — DX: Cardiac murmur, unspecified: R01.1

## 2024-07-20 LAB — COMPREHENSIVE METABOLIC PANEL WITH GFR
ALT: 18 U/L (ref 0–44)
AST: 20 U/L (ref 15–41)
Albumin: 4.6 g/dL (ref 3.5–5.0)
Alkaline Phosphatase: 55 U/L (ref 38–126)
Anion gap: 9 (ref 5–15)
BUN: 25 mg/dL — ABNORMAL HIGH (ref 8–23)
CO2: 31 mmol/L (ref 22–32)
Calcium: 9.6 mg/dL (ref 8.9–10.3)
Chloride: 100 mmol/L (ref 98–111)
Creatinine, Ser: 0.96 mg/dL (ref 0.44–1.00)
GFR, Estimated: >60 mL/min
Glucose, Bld: 108 mg/dL — ABNORMAL HIGH (ref 70–99)
Potassium: 3.9 mmol/L (ref 3.5–5.1)
Sodium: 140 mmol/L (ref 135–145)
Total Bilirubin: 0.7 mg/dL (ref 0.0–1.2)
Total Protein: 7.1 g/dL (ref 6.5–8.1)

## 2024-07-20 LAB — CBC
HCT: 47.4 % — ABNORMAL HIGH (ref 36.0–46.0)
Hemoglobin: 16.2 g/dL — ABNORMAL HIGH (ref 12.0–15.0)
MCH: 31.6 pg (ref 26.0–34.0)
MCHC: 34.2 g/dL (ref 30.0–36.0)
MCV: 92.6 fL (ref 80.0–100.0)
Platelets: 165 K/uL (ref 150–400)
RBC: 5.12 MIL/uL — ABNORMAL HIGH (ref 3.87–5.11)
RDW: 13.1 % (ref 11.5–15.5)
WBC: 9 K/uL (ref 4.0–10.5)
nRBC: 0 % (ref 0.0–0.2)

## 2024-07-20 LAB — GLUCOSE, CAPILLARY: Glucose-Capillary: 147 mg/dL — ABNORMAL HIGH (ref 70–99)

## 2024-07-20 LAB — URINALYSIS, ROUTINE W REFLEX MICROSCOPIC
Bacteria, UA: NONE SEEN
Bilirubin Urine: NEGATIVE
Glucose, UA: >=500 mg/dL — AB
Ketones, ur: NEGATIVE mg/dL
Leukocytes,Ua: NEGATIVE
Nitrite: NEGATIVE
Protein, ur: NEGATIVE mg/dL
Specific Gravity, Urine: 1.013 (ref 1.005–1.030)
pH: 6 (ref 5.0–8.0)

## 2024-07-20 LAB — TYPE AND SCREEN
ABO/RH(D): A POS
Antibody Screen: NEGATIVE

## 2024-07-20 LAB — PROTIME-INR
INR: 1 (ref 0.8–1.2)
Prothrombin Time: 13.8 s (ref 11.4–15.2)

## 2024-07-20 LAB — SURGICAL PCR SCREEN
MRSA, PCR: NEGATIVE
Staphylococcus aureus: NEGATIVE

## 2024-07-20 LAB — HEMOGLOBIN A1C
Hgb A1c MFr Bld: 5.9 % — ABNORMAL HIGH (ref 4.8–5.6)
Mean Plasma Glucose: 122.63 mg/dL

## 2024-07-20 LAB — APTT: aPTT: 29 s (ref 24–36)

## 2024-07-20 NOTE — Progress Notes (Signed)
 PCP - Okey Dunnings, MD Cardiologist - Delford Coy, MD  PPM/ICD - denies Device Orders - n/a Rep Notified - n/a  Chest x-ray - DOS EKG - 07/20/2024 Stress Test - 2011 ECHO - 03/22/2022 Cardiac Cath - denies  Sleep Study - yes CPAP - no  Fasting Blood Sugar - 143 at PAT (DM2) Checks Blood Sugar 0 times a day  Last dose of GLP1 agonist-  n/a GLP1 instructions: n/a  Blood Thinner Instructions: n/a Aspirin  Instructions: follow surgeon's instructions  ERAS Protcol -no PRE-SURGERY Ensure or G2- n/a  COVID TEST- n/a   Anesthesia review: yes, hx of HTN, DM2  Patient denies shortness of breath, fever, cough and chest pain at PAT appointment   All instructions explained to the patient, with a verbal understanding of the material. Patient agrees to go over the instructions while at home for a better understanding. Patient also instructed to self quarantine after being tested for COVID-19. The opportunity to ask questions was provided.

## 2024-07-20 NOTE — Pre-Procedure Instructions (Addendum)
 Surgical Instructions   Your procedure is scheduled on Monday, December 29th.   Report to [facility] Main Entrance A at 0920 A.M., then check in with the Admitting office. Any questions or running late day of surgery: call [phone]  Questions prior to your surgery date: call [phone], Monday-Friday, 8am-4pm. If you experience any cold or flu symptoms such as cough, fever, chills, shortness of breath, etc. between now and your scheduled surgery, please notify us  at the above number.     Remember:  Do not eat or drink after midnight the night before your surgery    Take these medicines the morning of surgery with A SIP OF WATER : metoprolol  succinate (TOPROL -XL)  pravastatin  (PRAVACHOL )   May take these medicines IF NEEDED: acetaminophen  (TYLENOL )  ALPRAZolam  (XANAX )  HYDROcodone-acetaminophen  (NORCO) levalbuterol (XOPENEX HFA) inhaler  metaxalone (SKELAXIN)  tiZANidine (ZANAFLEX) propranolol (INDERAL)   Please hold Jardiance for 72 hours prior to your surgery. Last dose should be on December 25th.   Follow your surgeon's instructions on when to stop Aspirin . If no instructions were given - please  give them a call.   One week prior to surgery, STOP taking any Aleve, Naproxen, Ibuprofen, Motrin, Advil, Goody's, BC's, all herbal medications, fish oil, and non-prescription vitamins.                     Do NOT Smoke (Tobacco/Vaping) for 24 hours prior to your procedure.  If you use a CPAP at night, you may bring your mask/headgear for your overnight stay.   You will be asked to remove any contacts, glasses, piercing's, hearing aid's, dentures/partials prior to surgery. Please bring cases for these items if needed.    Patients discharged the day of surgery will not be allowed to drive home, and someone needs to stay with them for 24 hours.  SURGICAL WAITING ROOM VISITATION Patients may have no more than 2 support people in the waiting area - these visitors may rotate.    Pre-op nurse will coordinate an appropriate time for 1 ADULT support person, who may not rotate, to accompany patient in pre-op.  Children under the age of 40 must have an adult with them who is not the patient and must remain in the main waiting area with an adult.  If the patient needs to stay at the hospital during part of their recovery, the visitor guidelines for inpatient rooms apply.  Please refer to the [facility] website for the visitor guidelines for any additional information.   If you received a COVID test during your pre-op visit  it is requested that you wear a mask when out in public, stay away from anyone that may not be feeling well and notify your surgeon if you develop symptoms. If you have been in contact with anyone that has tested positive in the last 10 days please notify you surgeon.      Pre-operative CHG Bathing Instructions   You can play a key role in reducing the risk of infection after surgery. Your skin needs to be as free of germs as possible. You can reduce the number of germs on your skin by washing with CHG (chlorhexidine  gluconate) soap before surgery. CHG is an antiseptic soap that kills germs and continues to kill germs even after washing.   DO NOT use if you have an allergy to chlorhexidine /CHG or antibacterial soaps. If your skin becomes reddened or irritated, stop using the CHG and notify one of our RNs at [phone].  TAKE A SHOWER THE NIGHT BEFORE SURGERY   Please keep in mind the following:  DO NOT shave, including legs and underarms, 48 hours prior to surgery.   You may shave your face before/day of surgery.  Place clean sheets on your bed the night before surgery Use a clean washcloth (not used since being washed) for shower. DO NOT sleep with pet's night before surgery.  CHG Shower Instructions:  Wash your face and private area with normal soap. If you choose to wash your hair, wash first with your normal shampoo.  After  you use shampoo/soap, rinse your hair and body thoroughly to remove shampoo/soap residue.  Turn the water OFF and apply half the bottle of CHG soap to a CLEAN washcloth.  Apply CHG soap ONLY FROM YOUR NECK DOWN TO YOUR TOES (washing for 3-5 minutes)  DO NOT use CHG soap on face, private areas, open wounds, or sores.  Pay special attention to the area where your surgery is being performed.  If you are having back surgery, having someone wash your back for you may be helpful. Wait 2 minutes after CHG soap is applied, then you may rinse off the CHG soap.  Pat dry with a clean towel  Put on clean pajamas    Additional instructions for the day of surgery: If you choose, you may shower the morning of surgery with an antibacterial soap.  DO NOT APPLY any lotions, deodorants, cologne, or perfumes.   Do not wear jewelry or makeup Do not wear nail polish, gel polish, artificial nails, or any other type of covering on natural nails (fingers and toes) Do not bring valuables to the hospital. [facility] is not responsible for valuables/personal belongings. Put on clean/comfortable clothes.  Please brush your teeth.  Ask your nurse before applying any prescription medications to the skin.

## 2024-07-21 NOTE — Progress Notes (Signed)
 Anesthesia Chart Review:   Case: [id number] Date/Time: 07/26/24 1113   Procedure: LOBECTOMY, LUNG, ROBOT-ASSISTED, USING VATS (Right: Chest) - ROBOTIC RIGHT MIDDLE LOBECTOMY, POSSIBLE BILOBECTOMY   Anesthesia type: General   Diagnosis: Primary non-small cell carcinoma of middle lobe of right lung (HCC) [C34.2]   Pre-op diagnosis: NON SMALL CELL CARCINOMA RIGHT MIDDLE LOBE   [facility] ROOM 10 / [facility] OR   Surgeons: Kerrin Elspeth BROCKS, MD       DISCUSSION: Patient is a 68 year old female scheduled for the above procedure. She has a RML spiculated lung nodule that increased in size by 03/2014 CT. S/p video bronchoscopy and endobronchial ultrasound on 06/03/2024 which showed non-small cell carcinoma. Above procedure planned.    History includes recent former smoker (quit 05/22/2024), DM2, HTN, HLD, tachycardia (SVT, s/p adenosine ~ 2008; recurrence ~ 02/2022), OSA (moderate 04/2022), right lung cancer (05/2025). Per pulmonology notes she received a year of treatment following a + TB test.    She has had sporadic cardiology evaluations. Initially ~ 2008 for SVT treated with adenosine, and then in 2011 following syncope/orthostatic hypotension. Most recently, she was evaluated in 2023 after EMS evaluation for palpitations and thought to be in afib with RVR with rate ~ 140's-150's--later reviewed by EP and thought likely SVT. Eliquis  was discontinued after 05/2022 Zio monitor showed no afib or sustained arrhythmias. 02/2022 TTE showed normal LV function and no significant valvular disease. 04/2022 CCTA showed CAC 47 (68th percentile), mild (1-24%) plaque in the mLAD and pRCA. Sleep study in 04/2022 showed moderate OSA.  Last visit was with EP Dr. Cindie on 07/06/2024. HTN at goal. SVT/NSVT/PVCs controlled on metoprolol . He noted plans for lung surgery/lobectomy. He advised one year general cardiology follow-up with as needed EP follow-up.  Dr. Charlott classified her Zubrod score as 0: Normal activity, no  symptoms.  Last Jardiance planned for 07/22/2024. A1c 5.9% on 07/20/2024.    Anesthesia team to evaluate on the day of surgery.     VS: BP (!) 142/70   Pulse 62   Temp 36.7 C   Resp 18   Ht 5' 3 (1.6 m)   Wt 70.3 kg   LMP 10/26/1998 Comment: ~IN 40s  SpO2 98%   BMI 27.44 kg/m   PROVIDERS: Okey Carlin Redbird, MD is PCP  Catherine Cools, MD is pulmonologist Cindie Smalls, MD is EP cardiologist  Shannon Agent, MD is RAD-ONC   LABS: Preoperative labs noted.  (all labs ordered are listed, but only abnormal results are displayed)  Labs Reviewed  GLUCOSE, CAPILLARY - Abnormal; Notable for the following components:      Result Value   Glucose-Capillary 147 (*)    All other components within normal limits  HEMOGLOBIN A1C - Abnormal; Notable for the following components:   Hgb A1c MFr Bld 5.9 (*)    All other components within normal limits  CBC - Abnormal; Notable for the following components:   RBC 5.12 (*)    Hemoglobin 16.2 (*)    HCT 47.4 (*)    All other components within normal limits  COMPREHENSIVE METABOLIC PANEL WITH GFR - Abnormal; Notable for the following components:   Glucose, Bld 108 (*)    BUN 25 (*)    All other components within normal limits  URINALYSIS, ROUTINE W REFLEX MICROSCOPIC - Abnormal; Notable for the following components:   Color, Urine STRAW (*)    Glucose, UA >=500 (*)    Hgb urine dipstick MODERATE (*)    All other components  within normal limits  SURGICAL PCR SCREEN  PROTIME-INR  APTT  TYPE AND SCREEN    OTHER: PFTs 06/14/2024: FVC 3.22 (107%) FEV1 2.44 (106%) DLCO 20.59 (108%)  Sleep Study 05/17/2022: IMPRESSIONS - Moderate obstructive sleep apnea overall (AHI  24.2/h); however, sleep apnea was severe with supine sleep (AHI 41.6/h) - Mild oxygen desaturation to a nmair of 88%. - Patient snored for 116.7 minutes (27.1%) during the sleep. RECOMMENDATIONS - In this patient with cardiovascular comorbidities recommend  therapeutic CPAP for treatment of her sleep disordered breathing. Can itiate a trial of Auto-PAP with EPR of 3 at 7 - 16 cm of water...     IMAGES: MRI Brain 07/18/2024: In process.  CT Super D Chest 06/02/2024: IMPRESSION: 1. No acute intrathoracic pathology. 2. A 12 mm right middle lobe nodule. 3.  Aortic Atherosclerosis (ICD10-I70.0).   PET Scan 05/13/2024:  IMPRESSION: 1. Spiculated right middle lobe pulmonary nodule with mildly abnormally elevated maximum SUV of 2.7, concerning for potential malignancy. 2. Small right lower hilar lymph node with mildly elevated maximum SUV of 3.1. 3. Sigmoid colon diverticulosis. 4. Atherosclerosis.      EKG:  EKG 07/20/2024: Normal sinus rhythm Nonspecific ST and T wave abnormality Abnormal ECG   EKG 06/03/2024: Sinus bradycardia Left axis deviation Abnormal ECG Confirmed by Barbaraann Kotyk [phone]) on 06/03/2024 3:06:50 P     CV: Long term monitor 06/02/2022 - 06/16/2022: HR 52 - 182 bpm, average 76 bpm. 2 nonsustained VT episodes, longest 9 beats 2 nonsustained SVT episodes, longest 6 beats. Occasional supraventricular ectopy, 1.6% Frequent ventricular ectopy, 7% No sustained arrhythmias. Symptom triggered episodes correspond to sinus rhythm with PVCs.     CT Coronary Morph w/CTA 05/02/2022: Coronary Arteries: Right dominant with no anomalies LM: No plaque or stenosis. LAD system: Noncalcified plaque in the mid LAD, mild (1-24%) stenosis. Circumflex system: No plaque or stenosis. RCA system: Calcified plaque proximal RCA, mild (1-24%) stenosis.   IMPRESSION: 1. Coronary artery calcium score 47 Agatston units. This places the patient in the 68th percentile for age and gender, suggesting intermediate risk for future events. 2.  Mild nonobstructive coronary disease.     Echo 03/22/2022: IMPRESSIONS   1. Left ventricular ejection fraction, by estimation, is 60 to 65%. The  left ventricle has normal function. The left ventricle  has no regional  wall motion abnormalities. Left ventricular diastolic parameters are  indeterminate.   2. Right ventricular systolic function is normal. The right ventricular  size is normal.   3. The mitral valve is normal in structure. Trivial mitral valve  regurgitation.   4. The aortic valve is tricuspid. Aortic valve regurgitation is not  visualized.   5. The inferior vena cava is normal in size with greater than 50%  respiratory variability, suggesting right atrial pressure of 3 mmHg.   Past Medical History:  Diagnosis Date   Anxiety    Depression    Diabetes mellitus without complication (HCC)    Dysrhythmia    PVCs, SVT   Heart murmur    1981; when pt was pregnant   Hyperlipidemia    Hypertension    Sleep apnea    Tachycardia    Tuberculosis 1980   was treated; latent    Past Surgical History:  Procedure Laterality Date   APPENDECTOMY     CESAREAN SECTION     ENDOBRONCHIAL ULTRASOUND Bilateral 06/03/2024   Procedure: ENDOBRONCHIAL ULTRASOUND (EBUS);  Surgeon: Catherine Cools, MD;  Location: [facility] ENDOSCOPY;  Service: Pulmonary;  Laterality: Bilateral;  TONSILLECTOMY  1974   VIDEO BRONCHOSCOPY WITH ENDOBRONCHIAL NAVIGATION Right 06/03/2024   Procedure: VIDEO BRONCHOSCOPY WITH ENDOBRONCHIAL NAVIGATION;  Surgeon: Catherine Cools, MD;  Location: [facility] ENDOSCOPY;  Service: Pulmonary;  Laterality: Right;    MEDICATIONS:  acetaminophen  (TYLENOL ) 500 MG tablet   ALPRAZolam  (XANAX ) 1 MG tablet   aspirin  EC 81 MG tablet   calcium carbonate (TUMS - DOSED IN MG ELEMENTAL CALCIUM) 500 MG chewable tablet   HYDROcodone-acetaminophen  (NORCO) 10-325 MG tablet   ibuprofen (ADVIL) 200 MG tablet   irbesartan  (AVAPRO ) 150 MG tablet   JARDIANCE 25 MG TABS tablet   levalbuterol (XOPENEX HFA) 45 MCG/ACT inhaler   metaxalone (SKELAXIN) 800 MG tablet   metoprolol  succinate (TOPROL -XL) 50 MG 24 hr tablet   nicotine (NICODERM CQ - DOSED IN MG/24 HOURS) 21 mg/24hr patch   pravastatin   (PRAVACHOL ) 40 MG tablet   propranolol (INDERAL) 10 MG tablet   tiZANidine (ZANAFLEX) 2 MG tablet   traZODone  (DESYREL ) 100 MG tablet   No current facility-administered medications for this encounter.    Isaiah Ruder, PA-C Surgical Short Stay/Anesthesiology [facility] Phone [phone] [facility] Phone [phone] 07/21/2024 1:55 PM

## 2024-07-21 NOTE — Anesthesia Preprocedure Evaluation (Addendum)
"                                    Anesthesia Evaluation  Patient identified by MRN, date of birth, ID band Patient awake    Reviewed: Allergy & Precautions, H&P , NPO status , Patient's Chart, lab work & pertinent test results  Airway Mallampati: II   Neck ROM: full    Dental   Pulmonary sleep apnea , former smoker   breath sounds clear to auscultation       Cardiovascular hypertension,  Rhythm:regular Rate:Normal     Neuro/Psych  PSYCHIATRIC DISORDERS Anxiety Depression       GI/Hepatic   Endo/Other  diabetes, Type 2    Renal/GU      Musculoskeletal   Abdominal   Peds  Hematology   Anesthesia Other Findings   Reproductive/Obstetrics                              Anesthesia Physical Anesthesia Plan  ASA: 3  Anesthesia Plan: General   Post-op Pain Management:    Induction: Intravenous  PONV Risk Score and Plan: 3 and Dexamethasone , Midazolam  and Treatment may vary due to age or medical condition  Airway Management Planned: Double Lumen EBT  Additional Equipment: Arterial line  Intra-op Plan:   Post-operative Plan: Extubation in OR  Informed Consent: I have reviewed the patients History and Physical, chart, labs and discussed the procedure including the risks, benefits and alternatives for the proposed anesthesia with the patient or authorized representative who has indicated his/her understanding and acceptance.     Dental advisory given  Plan Discussed with: CRNA, Anesthesiologist and Surgeon  Anesthesia Plan Comments: (PAT note written 07/21/2024 by Allison Zelenak, PA-C.  )         Anesthesia Quick Evaluation  "

## 2024-07-26 ENCOUNTER — Other Ambulatory Visit: Payer: Self-pay

## 2024-07-26 ENCOUNTER — Inpatient Hospital Stay (HOSPITAL_COMMUNITY)
Admission: RE | Admit: 2024-07-26 | Source: Home / Self Care | Admitting: Thoracic Surgery (Cardiothoracic Vascular Surgery)

## 2024-07-26 ENCOUNTER — Inpatient Hospital Stay (HOSPITAL_COMMUNITY): Payer: Self-pay | Admitting: Vascular Surgery

## 2024-07-26 ENCOUNTER — Encounter: Admission: RE | Payer: Self-pay

## 2024-07-26 ENCOUNTER — Inpatient Hospital Stay (HOSPITAL_COMMUNITY)

## 2024-07-26 ENCOUNTER — Encounter (HOSPITAL_COMMUNITY): Payer: Self-pay | Admitting: Thoracic Surgery (Cardiothoracic Vascular Surgery)

## 2024-07-26 DIAGNOSIS — K9189 Other postprocedural complications and disorders of digestive system: Secondary | ICD-10-CM

## 2024-07-26 DIAGNOSIS — I10 Essential (primary) hypertension: Secondary | ICD-10-CM

## 2024-07-26 DIAGNOSIS — I160 Hypertensive urgency: Secondary | ICD-10-CM

## 2024-07-26 DIAGNOSIS — K225 Diverticulum of esophagus, acquired: Secondary | ICD-10-CM | POA: Diagnosis not present

## 2024-07-26 DIAGNOSIS — N179 Acute kidney failure, unspecified: Secondary | ICD-10-CM

## 2024-07-26 DIAGNOSIS — Z87891 Personal history of nicotine dependence: Secondary | ICD-10-CM | POA: Diagnosis not present

## 2024-07-26 DIAGNOSIS — Z902 Acquired absence of lung [part of]: Secondary | ICD-10-CM

## 2024-07-26 DIAGNOSIS — F418 Other specified anxiety disorders: Secondary | ICD-10-CM

## 2024-07-26 DIAGNOSIS — C3490 Malignant neoplasm of unspecified part of unspecified bronchus or lung: Principal | ICD-10-CM | POA: Diagnosis present

## 2024-07-26 DIAGNOSIS — C342 Malignant neoplasm of middle lobe, bronchus or lung: Secondary | ICD-10-CM

## 2024-07-26 DIAGNOSIS — E119 Type 2 diabetes mellitus without complications: Secondary | ICD-10-CM

## 2024-07-26 DIAGNOSIS — J80 Acute respiratory distress syndrome: Secondary | ICD-10-CM

## 2024-07-26 DIAGNOSIS — J9601 Acute respiratory failure with hypoxia: Secondary | ICD-10-CM

## 2024-07-26 DIAGNOSIS — J189 Pneumonia, unspecified organism: Secondary | ICD-10-CM

## 2024-07-26 DIAGNOSIS — E43 Unspecified severe protein-calorie malnutrition: Secondary | ICD-10-CM | POA: Insufficient documentation

## 2024-07-26 HISTORY — PX: LYMPH NODE BIOPSY: SHX201

## 2024-07-26 HISTORY — PX: LOBECTOMY, LUNG, ROBOT-ASSISTED, USING VATS: SHX7607

## 2024-07-26 LAB — GLUCOSE, CAPILLARY
Glucose-Capillary: 120 mg/dL — ABNORMAL HIGH (ref 70–99)
Glucose-Capillary: 145 mg/dL — ABNORMAL HIGH (ref 70–99)
Glucose-Capillary: 135 mg/dL — ABNORMAL HIGH (ref 70–99)

## 2024-07-26 LAB — ABO/RH: ABO/RH(D): A POS

## 2024-07-26 MED ORDER — ACETAMINOPHEN 10 MG/ML IV SOLN
1000.0000 mg | Freq: Once | INTRAVENOUS | Status: DC | PRN
  Administered 2024-07-26: 1000 mg via INTRAVENOUS

## 2024-07-26 MED ORDER — LIDOCAINE 2% (20 MG/ML) 5 ML SYRINGE
INTRAMUSCULAR | Status: DC | PRN
  Administered 2024-07-26: 60 mg via INTRAVENOUS

## 2024-07-26 MED ORDER — CHLORHEXIDINE GLUCONATE 0.12 % MT SOLN
15.0000 mL | Freq: Once | OROMUCOSAL | Status: AC
  Administered 2024-07-26: 15 mL via OROMUCOSAL
  Filled 2024-07-26: qty 15

## 2024-07-26 MED ORDER — ONDANSETRON HCL 4 MG/2ML IJ SOLN
INTRAMUSCULAR | Status: AC
  Filled 2024-07-26: qty 2

## 2024-07-26 MED ORDER — LEVALBUTEROL TARTRATE 45 MCG/ACT IN AERO: 1.0000 | INHALATION_SPRAY | Freq: Three times a day (TID) | RESPIRATORY_TRACT | Status: DC | PRN

## 2024-07-26 MED ORDER — HYDROMORPHONE HCL 1 MG/ML IJ SOLN
INTRAMUSCULAR | Status: AC
  Filled 2024-07-26: qty 1

## 2024-07-26 MED ORDER — MIDAZOLAM HCL (PF) 2 MG/2ML IJ SOLN
INTRAMUSCULAR | Status: DC | PRN
  Administered 2024-07-26: 2 mg via INTRAVENOUS

## 2024-07-26 MED ORDER — PROPOFOL 10 MG/ML IV BOLUS
INTRAVENOUS | Status: AC
  Filled 2024-07-26: qty 20

## 2024-07-26 MED ORDER — KETOROLAC TROMETHAMINE 15 MG/ML IJ SOLN
15.0000 mg | Freq: Four times a day (QID) | INTRAMUSCULAR | Status: AC
  Administered 2024-07-26 – 2024-07-28 (×8): 15 mg via INTRAVENOUS
  Filled 2024-07-26 (×8): qty 1

## 2024-07-26 MED ORDER — ALBUMIN HUMAN 5 % IV SOLN: INTRAVENOUS | Status: DC | PRN

## 2024-07-26 MED ORDER — SODIUM CHLORIDE 0.45 % IV SOLN: INTRAVENOUS | Status: DC

## 2024-07-26 MED ORDER — SODIUM CHLORIDE 0.9 % IV SOLN: INTRAVENOUS | Status: DC | PRN

## 2024-07-26 MED ORDER — OXYCODONE HCL 5 MG/5ML PO SOLN: 5.0000 mg | Freq: Once | ORAL | Status: DC | PRN

## 2024-07-26 MED ORDER — LIDOCAINE 2% (20 MG/ML) 5 ML SYRINGE
INTRAMUSCULAR | Status: AC
  Filled 2024-07-26: qty 5

## 2024-07-26 MED ORDER — MORPHINE SULFATE (PF) 2 MG/ML IV SOLN
2.0000 mg | INTRAVENOUS | Status: DC | PRN
  Administered 2024-07-26 – 2024-07-27 (×7): 2 mg via INTRAVENOUS
  Filled 2024-07-26 (×7): qty 1

## 2024-07-26 MED ORDER — BUPIVACAINE LIPOSOME 1.3 % IJ SUSP
INTRAMUSCULAR | Status: AC
  Filled 2024-07-26: qty 20

## 2024-07-26 MED ORDER — PROPOFOL 10 MG/ML IV BOLUS
INTRAVENOUS | Status: DC | PRN
  Administered 2024-07-26: 180 mg via INTRAVENOUS

## 2024-07-26 MED ORDER — BUPIVACAINE HCL (PF) 0.5 % IJ SOLN
INTRAMUSCULAR | Status: AC
  Filled 2024-07-26: qty 30

## 2024-07-26 MED ORDER — ROCURONIUM BROMIDE 10 MG/ML (PF) SYRINGE
PREFILLED_SYRINGE | INTRAVENOUS | Status: DC | PRN
  Administered 2024-07-26: 50 mg via INTRAVENOUS
  Administered 2024-07-26: 20 mg via INTRAVENOUS
  Administered 2024-07-26: 50 mg via INTRAVENOUS

## 2024-07-26 MED ORDER — FENTANYL CITRATE (PF) 100 MCG/2ML IJ SOLN
INTRAMUSCULAR | Status: AC
  Filled 2024-07-26: qty 2

## 2024-07-26 MED ORDER — 0.9 % SODIUM CHLORIDE (POUR BTL) OPTIME
TOPICAL | Status: DC | PRN
  Administered 2024-07-26: 2000 mL

## 2024-07-26 MED ORDER — ACETAMINOPHEN 10 MG/ML IV SOLN
INTRAVENOUS | Status: AC
  Filled 2024-07-26: qty 100

## 2024-07-26 MED ORDER — SODIUM CHLORIDE 0.9% IV SOLUTION
INTRAVENOUS | Status: AC | PRN
  Administered 2024-07-26: 1000 mL

## 2024-07-26 MED ORDER — INSULIN ASPART 100 UNIT/ML IJ SOLN: 0.0000 [IU] | INTRAMUSCULAR | Status: DC | PRN

## 2024-07-26 MED ORDER — OXYCODONE HCL 5 MG PO TABS: 5.0000 mg | ORAL_TABLET | Freq: Once | ORAL | Status: DC | PRN

## 2024-07-26 MED ORDER — ONDANSETRON HCL 4 MG/2ML IJ SOLN: 4.0000 mg | Freq: Four times a day (QID) | INTRAMUSCULAR | Status: DC | PRN

## 2024-07-26 MED ORDER — NICOTINE 7 MG/24HR TD PT24
7.0000 mg | MEDICATED_PATCH | Freq: Every day | TRANSDERMAL | Status: DC
  Filled 2024-07-26 (×20): qty 1

## 2024-07-26 MED ORDER — PHENYLEPHRINE 80 MCG/ML (10ML) SYRINGE FOR IV PUSH (FOR BLOOD PRESSURE SUPPORT)
PREFILLED_SYRINGE | INTRAVENOUS | Status: DC | PRN
  Administered 2024-07-26: 80 ug via INTRAVENOUS

## 2024-07-26 MED ORDER — SODIUM CHLORIDE FLUSH 0.9 % IV SOLN
INTRAVENOUS | Status: DC | PRN
  Administered 2024-07-26: 100 mL

## 2024-07-26 MED ORDER — INSULIN ASPART 100 UNIT/ML IJ SOLN
0.0000 [IU] | Freq: Four times a day (QID) | INTRAMUSCULAR | Status: AC
  Administered 2024-07-26: 2 [IU] via SUBCUTANEOUS
  Administered 2024-07-27: 3 [IU] via SUBCUTANEOUS
  Administered 2024-07-27 (×2): 2 [IU] via SUBCUTANEOUS
  Administered 2024-07-28: 4 [IU] via SUBCUTANEOUS
  Administered 2024-07-28 (×3): 2 [IU] via SUBCUTANEOUS
  Administered 2024-07-29: 4 [IU] via SUBCUTANEOUS
  Administered 2024-07-29 – 2024-08-01 (×13): 2 [IU] via SUBCUTANEOUS
  Administered 2024-08-02 (×2): 4 [IU] via SUBCUTANEOUS
  Administered 2024-08-02: 2 [IU] via SUBCUTANEOUS
  Administered 2024-08-02: 4 [IU] via SUBCUTANEOUS
  Administered 2024-08-03 (×2): 2 [IU] via SUBCUTANEOUS
  Filled 2024-07-26 (×3): qty 2
  Filled 2024-07-26 (×2): qty 4
  Filled 2024-07-26: qty 2
  Filled 2024-07-26: qty 4
  Filled 2024-07-26 (×17): qty 2
  Filled 2024-07-26: qty 4
  Filled 2024-07-26: qty 2
  Filled 2024-07-26: qty 7
  Filled 2024-07-26: qty 2

## 2024-07-26 MED ORDER — MIDAZOLAM HCL 2 MG/2ML IJ SOLN
INTRAMUSCULAR | Status: AC
  Filled 2024-07-26: qty 2

## 2024-07-26 MED ORDER — PHENYLEPHRINE HCL-NACL 20-0.9 MG/250ML-% IV SOLN
INTRAVENOUS | Status: DC | PRN
  Administered 2024-07-26: 25 ug/min via INTRAVENOUS

## 2024-07-26 MED ORDER — CEFAZOLIN SODIUM-DEXTROSE 2-4 GM/100ML-% IV SOLN
2.0000 g | Freq: Three times a day (TID) | INTRAVENOUS | Status: AC
  Administered 2024-07-26 – 2024-07-27 (×2): 2 g via INTRAVENOUS
  Filled 2024-07-26 (×3): qty 100

## 2024-07-26 MED ORDER — HEMOSTATIC AGENTS (NO CHARGE) OPTIME
TOPICAL | Status: DC | PRN
  Administered 2024-07-26: 1 via TOPICAL

## 2024-07-26 MED ORDER — ONDANSETRON HCL 4 MG/2ML IJ SOLN
INTRAMUSCULAR | Status: DC | PRN
  Administered 2024-07-26: 4 mg via INTRAVENOUS

## 2024-07-26 MED ORDER — SODIUM CHLORIDE (PF) 0.9 % IJ SOLN
INTRAMUSCULAR | Status: AC
  Filled 2024-07-26: qty 50

## 2024-07-26 MED ORDER — ONDANSETRON HCL 4 MG/2ML IJ SOLN
4.0000 mg | Freq: Four times a day (QID) | INTRAMUSCULAR | Status: DC | PRN
  Administered 2024-07-27 – 2024-08-19 (×12): 4 mg via INTRAVENOUS
  Filled 2024-07-26 (×13): qty 2

## 2024-07-26 MED ORDER — LEVALBUTEROL HCL 0.63 MG/3ML IN NEBU
0.6300 mg | INHALATION_SOLUTION | Freq: Three times a day (TID) | RESPIRATORY_TRACT | Status: DC | PRN
  Administered 2024-08-04 – 2024-08-06 (×2): 0.63 mg via RESPIRATORY_TRACT
  Filled 2024-07-26 (×3): qty 3

## 2024-07-26 MED ORDER — FENTANYL CITRATE (PF) 250 MCG/5ML IJ SOLN
INTRAMUSCULAR | Status: DC | PRN
  Administered 2024-07-26 (×7): 50 ug via INTRAVENOUS
  Administered 2024-07-26: 100 ug via INTRAVENOUS

## 2024-07-26 MED ORDER — ACETAMINOPHEN 10 MG/ML IV SOLN
1000.0000 mg | Freq: Four times a day (QID) | INTRAVENOUS | Status: DC
  Administered 2024-07-26 – 2024-07-27 (×3): 1000 mg via INTRAVENOUS
  Filled 2024-07-26 (×4): qty 100

## 2024-07-26 MED ORDER — SUGAMMADEX SODIUM 200 MG/2ML IV SOLN
INTRAVENOUS | Status: AC
  Filled 2024-07-26: qty 2

## 2024-07-26 MED ORDER — PHENYLEPHRINE 80 MCG/ML (10ML) SYRINGE FOR IV PUSH (FOR BLOOD PRESSURE SUPPORT)
PREFILLED_SYRINGE | INTRAVENOUS | Status: AC
  Filled 2024-07-26: qty 10

## 2024-07-26 MED ORDER — CHLORHEXIDINE GLUCONATE CLOTH 2 % EX PADS
6.0000 | MEDICATED_PAD | Freq: Every day | CUTANEOUS | Status: DC
  Administered 2024-07-26 – 2024-07-27 (×2): 6 via TOPICAL

## 2024-07-26 MED ORDER — FENTANYL CITRATE (PF) 250 MCG/5ML IJ SOLN
INTRAMUSCULAR | Status: AC
  Filled 2024-07-26: qty 5

## 2024-07-26 MED ORDER — HYDROMORPHONE HCL 1 MG/ML IJ SOLN
0.2500 mg | INTRAMUSCULAR | Status: DC | PRN
  Administered 2024-07-26 (×4): 0.5 mg via INTRAVENOUS

## 2024-07-26 MED ORDER — LACTATED RINGERS IV SOLN: INTRAVENOUS | Status: DC | PRN

## 2024-07-26 MED ORDER — ROCURONIUM BROMIDE 10 MG/ML (PF) SYRINGE
PREFILLED_SYRINGE | INTRAVENOUS | Status: AC
  Filled 2024-07-26: qty 10

## 2024-07-26 MED ORDER — FENTANYL CITRATE (PF) 100 MCG/2ML IJ SOLN
25.0000 ug | INTRAMUSCULAR | Status: DC | PRN
  Administered 2024-07-26 (×3): 50 ug via INTRAVENOUS

## 2024-07-26 MED ORDER — ORAL CARE MOUTH RINSE: 15.0000 mL | Freq: Once | OROMUCOSAL | Status: AC

## 2024-07-26 MED ORDER — DEXAMETHASONE SOD PHOSPHATE PF 10 MG/ML IJ SOLN
INTRAMUSCULAR | Status: DC | PRN
  Administered 2024-07-26: 10 mg via INTRAVENOUS

## 2024-07-26 MED ORDER — SUGAMMADEX SODIUM 200 MG/2ML IV SOLN
INTRAVENOUS | Status: DC | PRN
  Administered 2024-07-26: 200 mg via INTRAVENOUS

## 2024-07-26 MED ORDER — ENOXAPARIN SODIUM 40 MG/0.4ML IJ SOSY
40.0000 mg | PREFILLED_SYRINGE | Freq: Every day | INTRAMUSCULAR | Status: DC
  Administered 2024-07-28 – 2024-08-14 (×18): 40 mg via SUBCUTANEOUS
  Filled 2024-07-26 (×18): qty 0.4

## 2024-07-26 MED ORDER — VANCOMYCIN HCL IN DEXTROSE 1-5 GM/200ML-% IV SOLN
1000.0000 mg | INTRAVENOUS | Status: AC
  Administered 2024-07-26: 1000 mg via INTRAVENOUS
  Filled 2024-07-26: qty 200

## 2024-07-26 MED ORDER — GLYCOPYRROLATE 0.2 MG/ML IJ SOLN
INTRAMUSCULAR | Status: DC | PRN
  Administered 2024-07-26: .2 mg via INTRAVENOUS

## 2024-07-26 NOTE — Discharge Instructions (Signed)
 Discharge Instructions:  1. You may shower, please wash incisions daily with soap and water and keep dry.  If you wish to cover wounds with dressing you may do so but please keep clean and change daily.  No tub baths or swimming until incisions have completely healed.  If your incisions become red or develop any drainage please call our office at [phone]  2. No Driving until cleared by Dr. Chrystal office and you are no longer using narcotic pain medications  3. Monitor your weight daily.. Please use the same scale and weigh at same time... If you gain 5-10 lbs in 48 hours with associated lower extremity swelling, please contact our office at [phone]  4. Fever of 101.5 for at least 24 hours with no source, please contact our office at [phone]  5. Activity- up as tolerated, please walk at least 3 times per day.  Avoid strenuous activity, no lifting, pushing, or pulling with your arms over 8-10 lbs for a minimum of 6 weeks  6. If any questions or concerns arise, please do not hesitate to contact our office at [phone]

## 2024-07-26 NOTE — Hospital Course (Addendum)
 HPI:  [name] sent for consultation regarding a right middle lobe non-small cell carcinoma.   [name] is a 68 year old woman with a history of tobacco abuse, positive tuberculosis test as a young woman, hypertension, hyperlipidemia, heart murmur, type 2 diabetes, and anxiety.   Has been followed for a right middle lobe lung nodule since about 2019.  Originally 8 mm.  Over time has slowly increased in size and developed a solid component.  Recently had a PET/CT which showed hypermetabolic activity in the nodule and also question of a right hilar node.  She underwent robotic bronchoscopy and endobronchial ultrasound by Dr. Catherine.  The nodule was positive for non-small cell carcinoma favor adeno.  There were lymphoid cells but no cancer cells from an 11R node.   She describes pain in the left side of her chest under her left shoulder blade when she coughs.  No exertional component.  She does have palpitations and has a history of paroxysmal SVT.  Occasionally has dizzy spells.  Complains of a productive cough.  No hemoptysis.  No recent weight loss.   She smoked about a pack and a half of cigarettes on average for 40 years prior to quitting in October (60-pack-year).  Dr. Kerrin reviewed the patient's diagnostic studies and determined she would benefit from surgical intervention. He reviewed the patient's treatment options as well as the risks and benefits of surgery.  Hospital Course: [name] presented to [facility] and was brought to the operating room on 07/26/24. She underwent robotic assisted right middle lobectomy, esophageal diverticulum resection, lymph node dissection and intercostal nerve block. She tolerated the procedure well and was transferred to the PACU in stable condition. She was kept NPO and esophagram POD1 showed a small contained esophageal leak at the mid esophagus. CXR showed no clear pneumothorax and there was no air leak. She was kept NPO and TPN was  started on 12/31, PICC was placed. She complained of abdominal pain and constipation.  She was treated with prn stool agents.  She developed dark urine and UA was obtained which showed no evidence of UTI but did have bacteria and blood present, felt likely to be due to foley placement in the OR.  The patient underwent repeat DG Esophogram on 07/30/2024 which showed a persistent esophageal leak without significant change.  Following this study, she remained n.p.o. with full nutritional support with TPN.  She has history of SVT and HTN and was started on IV Lopressor  10 mg every 6 hours and prn hydralazine .  Medication regimen was adjusted as hemodynamics indicated. She continued to have discomfort at the chest tube insertion site and also complained of pain in her right chest laterally.  A fentanyl  patch was added since she was unable to take any oral analgesics.  She continued to have white, opaque drainage from the right pleural tube.  She was started on empiric IV Zosyn .  Patient had issues with anxiety and medication was adjusted accordingly. An additional chest tube suture was placed after local anesthetic on 01/05. Repeat esophagram was scheduled for 08/03/2024. Apparently, she aspirated during esophagram so test was stopped. Unfortunately, unable to view area of concern (esophageal leak). Speech pathology was consulted but because barium would have to be used to evaluate further, it was decided that ultimately, an esophageal stent would be placed (because of previous esophageal leak identified). She underwent an esophagogastroscopy and a stent was placed on 08/05/2023.  Speech and Language pathology evaluation was performed and revealed coughing  with sips of water.  She underwent MBS which showed no evidence of aspiration.  Imaging was reviewed by Dr. Kerrin who recommended starting her on liquid diet.  She was able to be started on a liquid diet on 08/05/2024.  She continued to have increased sputum  production and was started on Mucinex , hypertonic saline, and scheduled xopenex .  Chest xray showed a small pleural effusion which was monitored clinically.  SLP was very concerned patient's oral intake was going into her trachea.  Further review showed patient's esophageal stent had migrated out of place.  She was again made strict NPO on 08/06/2024.  She required return to the operating room  with adjustment of previously placed esophageal stent. The patients cough significantly improved post procedure. The patient was kept NPO for 48 hours.  Repeat Esophogram was performed on 1/12 and showed no evidence of esophageal leak.  There was some concern of esophageal spasm.  She was placed on a NTG patch to aide in this.  This would also help with her blood pressure what remained high despite several agents.  The patient was started on a clear liquid diet.  This was advanced with goal of pureed diet as tolerated.  She was transitioned off NTG patch to oral Cardizem  to help with spasm and blood pressure.  She was transitioned to an oral regimen of Augmentin  and Diflucan  and will require 14 day course.   Unfortunately, her esophageal stent migrated out of position.  She was made strict NPO.  She was transitioned back to IV medications.  GI consult was obtained to assist with management.  They recommended repeat EGD.  The patient developed increase creatinine level to 1.43.  This was felt to be due to dehydration.  She was treated with a NS bolus and started on IV fluids.  She developed fever of 103, but was already being treated with broad spectrum ABX.  She had amber urine vs.  Hematuria and UA was obtained.  UA  howed few bacteria, 21-50 RBC, 6-10 WBC. Blood cultures were also obtained and showed no growth to date. Respiratory gram stain showed moderate gram negative rods and culture is pending. CT scan of chest/abdomen, pelvis with oral contrast was performed and showed displaced esophageal stent located in the body of  the stomach, small loculated right pleural effusion, and small pocket of air in the mediastinum posterior to the bronchus intermedius appears to communicate with the lumen of the bronchus intermedius . She developed AKI and creatinine increased to 2.01 on 01/16. Her creatinine was 1.54 previous day and she was given 500 cc LR and put on IVF at 100 ml/hr. GI performed an EGD, removed esophageal stent and replaced stent. Dr. Kerrin also performed a bronchoscopy which showed a small hole in the bronchus intermedius with secretions in the area. No organisms were seen on BAL. Esophagram 1/17 showed persistent leak at the distal end of the esophageal stent. TPN was restarted and she was kept NPO. She developed a bloody bowel movement and H/H dropped to 7.7/23 but stabilized and she had no further bloody bowel movements. Hematuria started to improve. She continued to have persistent hypertension, Labetalol  20 mg IV QID, nitroglycerin  patch was increased to 0.3mg , hydralazine  was scheduled and she continued to get hydralazine  prn. Labetalol  became unavailable so she was put on Cardene  drip on 01/19. She had to be transferred to [facility] ICU as 2C unable to manage Cardene  drip. Lopressor  suspension was started on 01/21. WBC up to 16,000 on  01/21. She remained afebrile. She was on Flagyl  and Zosyn . Blood culture showed no growth and respiratory culture showed few normal respiratory flora-no Staph aureus or Pseudomonas seen. Leukocytosis did improve. She was tolerating a full liquid diet with night TPN on 01/22. She was given IV Lasix . Creatine was slightly decreased to 2.98 on 01/22. Because she was not taking in enough calories, TPN was continued daily and she was given protein shakes. BP was better controlled by 01/23 and she was on Clonidine  patch 0.3, Amlodipine  5 mg daily, Hydralazine  25 mg tid, Lopressor  50 mg bid, Procardia  XL 60 mg daily, and Nitroglycerin  patch 0.3 mg. She had increased oxygen requirements the evening  of 01/24 and was put on NRB. She had a fever to 100.4 over the weekend. With worsening CXR, she was made NPO. WBC increased to 17,700. She was put on Cefepime , Zyvox , and Diflucan .  CT of chest done 01/24 showed widespread bilateral upper lobe opacity, small layering pleural effusions L>R, esophageal stent in place.  Hemoglobin decreased to 6.8 01/25 so she was transfused one unit PRBC. H and H improved to 8.5 and 25. WBC worsened and Micafungin  was added for additional coverage.  CXR continued to worsen;suspected to be multifactorial with pneumonia versus pneumonitis/acute lung injury and possibly superimposed pulmonary edema. She was re intubated the evening of 01/26. She underwent a bronchoscopy on 01/27 and BAL were done. Blood culture showed no growth for 3 days. Creatinine increased to 4.23 on 01/28 and nephrology following and will determine if needs HD. She required another blood transfusion on 01/28 as H and H decreased to 6.5 and 19.6. Creatinine continued to worsen to above 5 on 01/29. She then required CRRT. Creatinine improved to 2.47 and WBC decreased to 18,200 on 01/30.

## 2024-07-26 NOTE — Anesthesia Procedure Notes (Signed)
 Procedure Name: Intubation Date/Time: 07/26/2024 11:56 AM  Performed by: Elby Raelene SAUNDERS, CRNAPre-anesthesia Checklist: Patient identified, Emergency Drugs available, Suction available and Patient being monitored Patient Re-evaluated:Patient Re-evaluated prior to induction Oxygen Delivery Method: Circle System Utilized Preoxygenation: Pre-oxygenation with 100% oxygen Induction Type: IV induction Ventilation: Mask ventilation without difficulty Laryngoscope Size: Miller and 2 Grade View: Grade I Tube type: Oral Endobronchial tube: Left and Double lumen EBT Number of attempts: 1 Airway Equipment and Method: Stylet and Bite block Placement Confirmation: ETT inserted through vocal cords under direct vision, positive ETCO2 and breath sounds checked- equal and bilateral Secured at: 28 cm Tube secured with: Tape Dental Injury: Teeth and Oropharynx as per pre-operative assessment

## 2024-07-26 NOTE — Interval H&P Note (Signed)
 History and Physical Interval Note:  07/26/2024 10:51 AM  [name]  has presented today for surgery, with the diagnosis of NON SMALL CELL CARCINOMA RIGHT MIDDLE LOBE.  The various methods of treatment have been discussed with the patient and family. After consideration of risks, benefits and other options for treatment, the patient has consented to  Procedures with comments: LOBECTOMY, LUNG, ROBOT-ASSISTED, USING VATS (Right) - ROBOTIC RIGHT MIDDLE LOBECTOMY, POSSIBLE BILOBECTOMY as a surgical intervention.  The patient's history has been reviewed, patient examined, no change in status, stable for surgery.  I have reviewed the patient's chart and labs.  Questions were answered to the patient's satisfaction.     [name]

## 2024-07-26 NOTE — Progress Notes (Signed)
" ° °  [address]       [name] CHILD [id number]             [phone]   S/p right middle lobectomy, resection of esophageal diverticulum  Some incisional pain, c/o being thirsty  CXR shows NG coiled in mouth- will dc   Strict NPO, Check swallow in AM  Kelly Markell C. Kerrin, MD [facility] [phone]  "

## 2024-07-26 NOTE — Transfer of Care (Signed)
 Immediate Anesthesia Transfer of Care Note  Patient: [name]  Procedure(s) Performed: ROBOT ASSISTED RIGHT MIDDLE LOBECTOMY; RESECTION OF ESOPHAGEAL DIVERTICULUM (Right: Chest) LYMPH NODE BIOPSY (Chest)  Patient Location: PACU  Anesthesia Type:General  Level of Consciousness: awake, alert , and oriented  Airway & Oxygen Therapy: Patient Spontanous Breathing  Post-op Assessment: Report given to RN and Post -op Vital signs reviewed and stable  Post vital signs: Reviewed and stable  Last Vitals:  Vitals Value Taken Time  BP    Temp    Pulse 72 07/26/24 15:55  Resp 17 07/26/24 15:55  SpO2 95 % 07/26/24 15:55  Vitals shown include unfiled device data.  Last Pain:  Vitals:   07/26/24 1031  TempSrc:   PainSc: 0-No pain         Complications: No notable events documented.

## 2024-07-26 NOTE — Op Note (Signed)
 [name], [name] MEDICAL RECORD NO: [id number] ACCOUNT NO: [id number] DATE OF BIRTH: Oct 25, 1955 FACILITY: [facility] LOCATION: [facility] PHYSICIAN: Elspeth BROCKS. Kerrin, MD  Operative Report   DATE OF PROCEDURE: 07/26/2024  PREOPERATIVE DIAGNOSIS:  Non-small cell carcinoma, right middle lobe, clinical stage IA.  POSTOPERATIVE DIAGNOSES:  1. Non-small cell carcinoma, right middle lobe, clinical stage IA,  2. Esophageal diverticulum.  PROCEDURE:   i robotic-assisted right middle lobectomy,  Lymph node dissection,  Intercostal nerve blocks levels 3 through 10, and  Resection of esophageal diverticulum.  SURGEON:  Elspeth BROCKS. Kerrin, MD  ASSISTANT:  Con Bend, PA.  Experienced assistance was necessary for this case due to surgical complexity.  Con Bend assisted with port placement, robot docking and undocking, instrument exchange, specimen retrieval, suctioning, and wound closure.  ANESTHESIA:  General.  FINDINGS:  Multiple enlarged nodes, all negative for carcinoma on frozen section.  Bronchial margin negative for tumor.  Large approximately 3 x 3 cm diverticulum midesophagus below carina and above the inferior pulmonary vein.  No malignancy seen on frozen section.  CLINICAL NOTE:  [name] is a 68 year old woman with a history of tobacco abuse who had been followed for a right middle lung nodule for several years.  Recently she had a PET/CT which showed the nodule was hypermetabolic with questionable right hilar nodes.  She underwent robotic bronchoscopy and endobronchial ultrasound.  The nodule was non-small cell carcinoma.  There were lymphoid cells but no cancer cells in lymph node.  She was offered surgical resection.  The indications, risks, benefits, and alternatives were discussed in detail with the patient.  She understood and accepted the risk and agreed to proceed.  OPERATIVE NOTE:  [name] was brought to the operating room on 07/26/2024.  She had induction of  general anesthesia, was intubated with a double-lumen endotracheal tube.  Intravenous antibiotics were administered.  A Foley catheter was placed.  Sequential compression devices were placed on the calves for DVT prophylaxis.  She was placed in a left lateral decubitus position.  A Bair Hugger was placed for active warming.  The right chest was prepped and draped in the usual sterile fashion.  A time-out was performed.  A solution containing 20 mL of liposomal bupivacaine , 30 mL of 0.5% bupivacaine , and 50 mL of saline was prepared.  This solution was used for local at the incision sites as well as for the intercostal nerve blocks.  An incision was made in the eighth interspace in the midaxillary line.  An 8-mm robotic port was inserted. After confirming intrapleural placement carbon dioxide was insufflated per protocol.  A 12-mm robotic port was placed in the eighth interspace anterior to the camera port.  Intercostal nerve blocks were performed from the third to the tenth interspace by injecting 10 mL of the bupivacaine  solution into a subpleural plane at each level.  There was good isolation of the right lung and it deflated quickly.  The AirSeal port was placed in the tenth interspace posterolaterally and then 2 additional eighth interspace robotic ports were placed.  The robot was deployed.  The camera arm was docked.  A targeting was performed.  The remaining arms were docked.  The robotic instruments were inserted with thoracoscopic visualization.  Dissection was begun inferiorly.  All dissection was performed with bipolar cautery.  The inferior ligament was divided.  Level 9 node was removed.  The lung was retracted anteriorly and there was a large diverticulum versus mass of the esophagus.  It was unclear which  it was at first.  It was thought this could possibly be a leiomyoma or an esophageal duplication cyst.  Level 8 and Level 7 nodes were removed.  Dissection then was begun on the esophageal  abnormality.  It was relatively firm and seemed tumor-like.  As the dissection continued, the muscular layers were separated and no clear demarcation from the mucosa was identified.  A small tear occurred.  The mass appeared to be either a duplication cyst or a diverticulum.  There were fibrous, probable vegetal, matter within the diverticulum.  The diverticulum was stapled at its base.  There was a small opening in the mucosa that was closed with a 3-0 Vicryl suture, then the muscular layers were reapproximated with 2-0 PDS sutures.  An NG tube was placed during the dissection of the diverticulum.  The lymph node dissection then continued.  The level 11 node was removed at the bifurcation of the right upper lobe bronchus.  The lung then was retracted inferiorly.  The pleural reflection was divided between the azygos vein and the upper lobe and multiple 4R nodes were removed superior to the azygos vein and a level 10 node was removed inferior to the azygos vein.  Anteriorly, the pleural reflection was divided and the middle lobe vein was identified.  The fissure then was inspected.  The pulmonary artery was clearly visible and there was a relatively large level 12 lymph node.  The pleura overlying the pulmonary artery was divided with bipolar cautery and then dissection was begun on the node.  The node was dissected off the middle lobe pulmonary artery branches which were encircled and then divided with the robotic stapler.  A portion of the node was removed and sent for frozen section.  The remainder of that node was removed and there was another large level 12 node just below that, that was also removed and sent for frozen section.  The major fissure then was completed between the lower lobe and the middle lobe and there was a large level 11 node which was removed and that was also sent for frozen section.  All the frozen sections on the lymph nodes ultimately returned with no carcinoma seen.  The middle lobe  vein was encircled and divided and then later during the dissection it was apparent there was a second middle lobe vein that was also divided.  The  middle lobe bronchus then was encircled.  Additional nodes were removed and then the stapler was placed across the middle lobe bronchus and closed.  A test inflation showed good aeration of the upper and lower lobes and the staple was fired, transecting the middle lobe bronchus.  The minor fissure then was completed with sequential firings of the robotic stapler and then after that device expired, it was completed after removing the robot using an Ethicon stapler.  The vessel loop and sponges used during the dissection were removed.  The chest was copiously irrigated with saline.  A test inflation to 30 cm of water pressure revealed no air leak.  The robotic instruments were removed.  The robot was undocked.  The anterior eighth interspace incision was lengthened to approximately 3 cm.  An 8-mm endoscopic retrieval bag was placed into the chest.  The middle lobe was directed into the bag and removed.  It was sent for frozen section of the bronchial margin which returned with no tumor seen.  A final inspection was made for hemostasis at all the staple lines and port sites.  A 28-French Blake drain was placed through the original port incision and directed posteriorly along the esophagus and near the area of the repair and directed to the apex.  It  was secured with a #1 silk suture.  Dual lung ventilation was resumed.  The incisions were closed in standard fashion.  The chest tube was placed to a Pleur-evac on waterseal.  The patient then was placed in a supine position.  He was extubated in the operating room and taken to the postanesthesia care unit in good condition.    SUJ D: 07/26/2024 6:20:11 pm T: 07/26/2024 11:08:00 pm  JOB: [id number]/ [id number]

## 2024-07-26 NOTE — Brief Op Note (Signed)
 07/26/2024  3:41 PM  PATIENT:  [name]  68 y.o. female  PRE-OPERATIVE DIAGNOSIS:  NON SMALL CELL CARCINOMA RIGHT MIDDLE LOBE  POST-OPERATIVE DIAGNOSIS:  NON SMALL CELL CARCINOMA RIGHT MIDDLE LOBE  PROCEDURE:  ROBOT ASSISTED RIGHT MIDDLE LOBECTOMY; RESECTION OF ESOPHAGEAL DIVERTICULUM (Right)  LYMPH NODE DISSECTION INTERCOSTAL NERVE BLOCK (RIGHT)  SURGEON:  Surgeons and Role:    * Kerrin Elspeth BROCKS, MD - Primary  PHYSICIAN ASSISTANT: Con Bend PA-C  ASSISTANTS: none   ANESTHESIA:   local and general  EBL:  150 mL   BLOOD ADMINISTERED:none  DRAINS: right pleural drain   LOCAL MEDICATIONS USED:  OTHER exparel   SPECIMEN:  Source of Specimen:  esophageal cyst, right middle lobe, lymph nodes  DISPOSITION OF SPECIMEN:  PATHOLOGY  COUNTS:  YES  TOURNIQUET:  * No tourniquets in log *  DICTATION: .Dragon Dictation  PLAN OF CARE: Admit to inpatient   PATIENT DISPOSITION:  PACU - hemodynamically stable.   Delay start of Pharmacological VTE agent (>24hrs) due to surgical blood loss or risk of bleeding: no

## 2024-07-26 NOTE — Anesthesia Procedure Notes (Signed)
 Arterial Line Insertion Start/End12/29/2025 12:02 PM, 07/26/2024 12:10 PM Performed by: Maryclare Cornet, MD, anesthesiologist  Patient location: OR. Preanesthetic checklist: patient identified, IV checked, site marked, risks and benefits discussed, surgical consent, monitors and equipment checked, pre-op evaluation, timeout performed and anesthesia consent Patient sedated Left, radial was placed Catheter size: 20 G Maximum sterile barriers used   Attempts: 1 Procedure performed using ultrasound to evaluate access site. Ultrasound Notes:relevant anatomy identified, ultrasound used to visualize needle entry and vessel patent under ultrasound. Following insertion, dressing applied and Biopatch. Post procedure assessment: normal and unchanged

## 2024-07-27 ENCOUNTER — Encounter (HOSPITAL_COMMUNITY): Payer: Self-pay | Admitting: Thoracic Surgery (Cardiothoracic Vascular Surgery)

## 2024-07-27 ENCOUNTER — Inpatient Hospital Stay (HOSPITAL_COMMUNITY)

## 2024-07-27 ENCOUNTER — Other Ambulatory Visit: Payer: Self-pay

## 2024-07-27 LAB — CBC
HCT: 37.2 % (ref 36.0–46.0)
Hemoglobin: 12.8 g/dL (ref 12.0–15.0)
MCH: 31.9 pg (ref 26.0–34.0)
MCHC: 34.4 g/dL (ref 30.0–36.0)
MCV: 92.8 fL (ref 80.0–100.0)
Platelets: 100 K/uL — ABNORMAL LOW (ref 150–400)
RBC: 4.01 MIL/uL (ref 3.87–5.11)
RDW: 13.1 % (ref 11.5–15.5)
WBC: 19.8 K/uL — ABNORMAL HIGH (ref 4.0–10.5)
nRBC: 0 % (ref 0.0–0.2)

## 2024-07-27 LAB — BASIC METABOLIC PANEL WITH GFR
Anion gap: 7 (ref 5–15)
BUN: 19 mg/dL (ref 8–23)
CO2: 25 mmol/L (ref 22–32)
Calcium: 7.7 mg/dL — ABNORMAL LOW (ref 8.9–10.3)
Chloride: 100 mmol/L (ref 98–111)
Creatinine, Ser: 0.85 mg/dL (ref 0.44–1.00)
GFR, Estimated: >60 mL/min
Glucose, Bld: 126 mg/dL — ABNORMAL HIGH (ref 70–99)
Potassium: 3.7 mmol/L (ref 3.5–5.1)
Sodium: 132 mmol/L — ABNORMAL LOW (ref 135–145)

## 2024-07-27 LAB — GLUCOSE, CAPILLARY
Glucose-Capillary: 131 mg/dL — ABNORMAL HIGH (ref 70–99)
Glucose-Capillary: 137 mg/dL — ABNORMAL HIGH (ref 70–99)
Glucose-Capillary: 130 mg/dL — ABNORMAL HIGH (ref 70–99)
Glucose-Capillary: 124 mg/dL — ABNORMAL HIGH (ref 70–99)

## 2024-07-27 MED ORDER — CARMEX CLASSIC LIP BALM EX OINT
TOPICAL_OINTMENT | CUTANEOUS | Status: AC | PRN
  Administered 2024-08-22: 1 via TOPICAL
  Filled 2024-07-27 (×2): qty 10

## 2024-07-27 MED ORDER — HYDRALAZINE HCL 20 MG/ML IJ SOLN
10.0000 mg | Freq: Four times a day (QID) | INTRAMUSCULAR | Status: DC | PRN
  Administered 2024-07-27 – 2024-08-06 (×19): 10 mg via INTRAVENOUS
  Filled 2024-07-27 (×20): qty 1

## 2024-07-27 MED ORDER — SODIUM CHLORIDE 0.9 % IV SOLN
12.5000 mg | Freq: Four times a day (QID) | INTRAVENOUS | Status: AC | PRN
  Administered 2024-08-02 – 2024-09-03 (×12): 12.5 mg via INTRAVENOUS
  Filled 2024-07-27: qty 12.5
  Filled 2024-07-27: qty 0.5
  Filled 2024-07-27 (×2): qty 12.5
  Filled 2024-07-27: qty 0.5
  Filled 2024-07-27 (×4): qty 12.5
  Filled 2024-07-27: qty 0.5
  Filled 2024-07-27 (×4): qty 12.5

## 2024-07-27 MED ORDER — SODIUM CHLORIDE 0.9 % IV SOLN: INTRAVENOUS | Status: DC

## 2024-07-27 MED ORDER — HYDROMORPHONE HCL 1 MG/ML IJ SOLN
1.0000 mg | INTRAMUSCULAR | Status: DC | PRN
  Administered 2024-07-27 – 2024-08-22 (×160): 1 mg via INTRAVENOUS
  Filled 2024-07-27 (×162): qty 1

## 2024-07-27 MED ORDER — ACETAMINOPHEN 10 MG/ML IV SOLN
1000.0000 mg | Freq: Four times a day (QID) | INTRAVENOUS | Status: AC
  Administered 2024-07-27 – 2024-07-28 (×4): 1000 mg via INTRAVENOUS
  Filled 2024-07-27 (×4): qty 100

## 2024-07-27 MED ORDER — LORAZEPAM 2 MG/ML IJ SOLN
0.5000 mg | Freq: Two times a day (BID) | INTRAMUSCULAR | Status: DC | PRN
  Administered 2024-07-28 – 2024-08-02 (×8): 0.5 mg via INTRAVENOUS
  Filled 2024-07-27 (×10): qty 1

## 2024-07-27 NOTE — Plan of Care (Signed)
" °  Problem: Education: Goal: Knowledge of the prescribed therapeutic regimen will improve Outcome: Progressing   Problem: Bowel/Gastric: Goal: Gastrointestinal status for postoperative course will improve Outcome: Progressing   Problem: Cardiac: Goal: Ability to maintain an adequate cardiac output Outcome: Progressing Goal: Will show no evidence of cardiac arrhythmias Outcome: Progressing   Problem: Nutritional: Goal: Will attain and maintain optimal nutritional status Outcome: Progressing   Problem: Clinical Measurements: Goal: Ability to maintain clinical measurements within normal limits Outcome: Progressing Goal: Postoperative complications will be avoided or minimized Outcome: Progressing   Problem: Respiratory: Goal: Will regain and/or maintain adequate ventilation Outcome: Progressing Goal: Respiratory status will improve Outcome: Progressing   Problem: Skin Integrity: Goal: Demonstrates signs of wound healing without infection Outcome: Progressing   Problem: Education: Goal: Knowledge of General Education information will improve Description: Including pain rating scale, medication(s)/side effects and non-pharmacologic comfort measures Outcome: Progressing   Problem: Health Behavior/Discharge Planning: Goal: Ability to manage health-related needs will improve Outcome: Progressing   Problem: Coping: Goal: Level of anxiety will decrease Outcome: Progressing   "

## 2024-07-27 NOTE — Progress Notes (Addendum)
 "     [address] [employer] [address]             [phone]      1 Day Post-Op Procedures (LRB): ROBOT ASSISTED RIGHT MIDDLE LOBECTOMY; RESECTION OF ESOPHAGEAL DIVERTICULUM (Right) LYMPH NODE BIOPSY Subjective: Patient reports she has pain all over her right side worse than yesterday   Objective: Vital signs in last 24 hours: Temp:  [97.3 F (36.3 C)-99.2 F (37.3 C)] 98.5 F (36.9 C) (12/30 0232) Pulse Rate:  [61-83] 74 (12/30 0232) Cardiac Rhythm: Normal sinus rhythm (12/29 1859) Resp:  [8-20] 14 (12/30 0232) BP: (131-180)/(58-113) 140/64 (12/30 0232) SpO2:  [90 %-98 %] 93 % (12/30 0232) Arterial Line BP: (174-194)/(68-84) 183/68 (12/29 1700) Weight:  [70.3 kg] 70.3 kg (12/29 0917)  Hemodynamic parameters for last 24 hours:    Intake/Output from previous day: 12/29 0701 - 12/30 0700 In: 4006.7 [I.V.:3181.8; IV Piggyback:824.9] Out: 1600 [Urine:1000; Blood:150; Chest Tube:450] Intake/Output this shift: No intake/output data recorded.  General appearance: alert, cooperative, and no distress Neurologic: intact Heart: regular rate and rhythm, S1, S2 normal, no murmur, click, rub or gallop Lungs: clear to auscultation bilaterally Abdomen: soft, non-tender; bowel sounds normal; no masses,  no organomegaly Extremities: SCDs in place Wound: Chest tube dressing saturated with serosanguinous drainage, other incision sites are clean and dry without sign of infection  Lab Results: Recent Labs    07/27/24 0220  WBC 19.8*  HGB 12.8  HCT 37.2  PLT 100*   BMET:  Recent Labs    07/27/24 0220  NA 132*  K 3.7  CL 100  CO2 25  GLUCOSE 126*  BUN 19  CREATININE 0.85  CALCIUM 7.7*    PT/INR: No results for input(s): LABPROT, INR in the last 72 hours. ABG    Component Value Date/Time   TCO2 25 11/10/2009 0952   CBG (last 3)  Recent Labs    07/26/24 1557 07/26/24 1843 07/27/24 0616  GLUCAP 145* 135* 131*    Assessment/Plan: S/P  Procedures (LRB): ROBOT ASSISTED RIGHT MIDDLE LOBECTOMY; RESECTION OF ESOPHAGEAL DIVERTICULUM (Right) LYMPH NODE BIOPSY  Neuro: Pain this AM worse than yesterday, continue current regimen for now. Will add robaxin and gabapentin once she can take PO meds.  CV: HTN, on Avapro  150mg  daily and Toprol  XL 50mg  daily at home for BP. Will start IV Hydralazine  until she can take PO meds. NSR, HR 70s.   Pulm: Saturating well on RA. CT output 45cc/24hrs. CXR without clear pneumothorax. No air leak seen this AM, chest tube to water seal. Encourage IS and ambulation. Leave CT in place for now.   GI: NPO until swallow study this AM due to removal of esophageal diverticulum. Will get swallow this AM and start clear liquid diet if clear. NG tube removed per surgeon yesterday due to kink.   Endo: T2DM, preop A1C 5.9. On Jardiance at home. CBGs controlled on SSI, will restart Jardiance at discharge.   Renal: Cr 0.85, stable. UO 1000cc/24hrs. D/C foley this AM.   ID: Likely reactive leukocytosis, Tmax 99.2. No clear sign of infection. Will clinically monitor.   DVT Prophylaxis: Lovenox   Dispo: Swallow study this AM, hopefully can start PO clear liquid diet if clear. Leave CT in place for now.    LOS: 1 day    Con GORMAN Bend, PA-C 07/27/2024  Patient seen and examined, agree with above Will try dilaudid  instead of morphine  Ambulate Check swallow- if [facility]  will start clears No air leak but keep chest tube until taking regular diet  Elspeth C. Kerrin, MD [facility] [phone]  "

## 2024-07-27 NOTE — Progress Notes (Addendum)
" ° °   °  [address] [place] [address]             [phone]      Patient has a small contained leak on postoperative esophagram. As discussed with Dr. Kerrin will continue NPO and IV fluids for now and repeat esophagram on Friday. TPN per pharmacy has been ordered and pharmacy confirms that she is on the schedule for tomorrow. She does not have a central line in place so PICC placement has been ordered. She is anxious and takes Xanax  1mg  TID PRN and Trazodone  200mg  at bedtime at home. As discussed with pharmacy will start Ativan 0.5mg  BID PRN for anxiety.   Con GORMAN Bend, PA-C 07/27/24 "

## 2024-07-27 NOTE — TOC CM/SW Note (Signed)
 Transition of Care [facility]) - Inpatient Brief Assessment   Patient Details  Name: [name] MRN: [id number] Date of Birth: Jun 25, 1956  Transition of Care [facility] CM/SW Contact:    Lauraine FORBES Saa, LCSWA Phone Number: 07/27/2024, 10:09 AM   Clinical Narrative:  10:09 AM Per chart review, patient resides at home with spouse. Patient has a PCP and insurance listed. Patient does not appear to have SNF or HH history on chart. Patient has DME (CPAP) history with [facility] from 2023-2024. Patient's preferred pharmacy listed in [facility] [id number] [place]. No TOC needs identified at this time. TOC will continue to follow.  Transition of Care Asessment: Insurance and Status: Insurance coverage has been reviewed Patient has primary care physician: Yes Home environment has been reviewed: Private Residence Prior level of function:: N/A Prior/Current Home Services: No current home services Social Drivers of Health Review: SDOH reviewed no interventions necessary Readmission risk has been reviewed: Yes (Currently Green 8%) Transition of care needs: no transition of care needs at this time

## 2024-07-27 NOTE — Anesthesia Postprocedure Evaluation (Signed)
"   Anesthesia Post Note  Patient: [name]  Procedure(s) Performed: ROBOT ASSISTED RIGHT MIDDLE LOBECTOMY; RESECTION OF ESOPHAGEAL DIVERTICULUM (Right: Chest) LYMPH NODE BIOPSY (Chest)     Patient location during evaluation: PACU Anesthesia Type: General Level of consciousness: awake and alert Pain management: pain level controlled Vital Signs Assessment: post-procedure vital signs reviewed and stable Respiratory status: spontaneous breathing, nonlabored ventilation, respiratory function stable and patient connected to nasal cannula oxygen Cardiovascular status: blood pressure returned to baseline and stable Postop Assessment: no apparent nausea or vomiting Anesthetic complications: no   No notable events documented.  Last Vitals:  Vitals:   07/27/24 0232 07/27/24 0850  BP: (!) 140/64 (!) 156/64  Pulse: 74 91  Resp: 14 12  Temp: 36.9 C 37.5 C  SpO2: 93% 96%    Last Pain:  Vitals:   07/27/24 0852  TempSrc:   PainSc: 10-Worst pain ever                 Thom JONELLE Peoples      "

## 2024-07-28 ENCOUNTER — Other Ambulatory Visit: Payer: Self-pay

## 2024-07-28 ENCOUNTER — Inpatient Hospital Stay (HOSPITAL_COMMUNITY)

## 2024-07-28 DIAGNOSIS — E43 Unspecified severe protein-calorie malnutrition: Secondary | ICD-10-CM | POA: Insufficient documentation

## 2024-07-28 LAB — COMPREHENSIVE METABOLIC PANEL WITH GFR
ALT: 11 U/L (ref 0–44)
AST: 25 U/L (ref 15–41)
Albumin: 3.3 g/dL — ABNORMAL LOW (ref 3.5–5.0)
Alkaline Phosphatase: 40 U/L (ref 38–126)
Anion gap: 8 (ref 5–15)
BUN: 15 mg/dL (ref 8–23)
CO2: 23 mmol/L (ref 22–32)
Calcium: 8.5 mg/dL — ABNORMAL LOW (ref 8.9–10.3)
Chloride: 105 mmol/L (ref 98–111)
Creatinine, Ser: 0.76 mg/dL (ref 0.44–1.00)
GFR, Estimated: >60 mL/min
Glucose, Bld: 119 mg/dL — ABNORMAL HIGH (ref 70–99)
Potassium: 3.5 mmol/L (ref 3.5–5.1)
Sodium: 137 mmol/L (ref 135–145)
Total Bilirubin: 0.8 mg/dL (ref 0.0–1.2)
Total Protein: 5.1 g/dL — ABNORMAL LOW (ref 6.5–8.1)

## 2024-07-28 LAB — MAGNESIUM: Magnesium: 1.6 mg/dL — ABNORMAL LOW (ref 1.7–2.4)

## 2024-07-28 LAB — CBC
HCT: 38.4 % (ref 36.0–46.0)
Hemoglobin: 13.3 g/dL (ref 12.0–15.0)
MCH: 32 pg (ref 26.0–34.0)
MCHC: 34.6 g/dL (ref 30.0–36.0)
MCV: 92.5 fL (ref 80.0–100.0)
Platelets: 96 K/uL — ABNORMAL LOW (ref 150–400)
RBC: 4.15 MIL/uL (ref 3.87–5.11)
RDW: 13.4 % (ref 11.5–15.5)
WBC: 14 K/uL — ABNORMAL HIGH (ref 4.0–10.5)
nRBC: 0 % (ref 0.0–0.2)

## 2024-07-28 LAB — PHOSPHORUS: Phosphorus: 1.7 mg/dL — ABNORMAL LOW (ref 2.5–4.6)

## 2024-07-28 LAB — URINALYSIS, ROUTINE W REFLEX MICROSCOPIC
Bilirubin Urine: NEGATIVE
Glucose, UA: 150 mg/dL — AB
Ketones, ur: 5 mg/dL — AB
Leukocytes,Ua: NEGATIVE
Nitrite: NEGATIVE
Protein, ur: 100 mg/dL — AB
RBC / HPF: >50 RBC/hpf (ref 0–5)
Specific Gravity, Urine: 1.03 (ref 1.005–1.030)
pH: 5 (ref 5.0–8.0)

## 2024-07-28 LAB — GLUCOSE, CAPILLARY
Glucose-Capillary: 122 mg/dL — ABNORMAL HIGH (ref 70–99)
Glucose-Capillary: 133 mg/dL — ABNORMAL HIGH (ref 70–99)
Glucose-Capillary: 162 mg/dL — ABNORMAL HIGH (ref 70–99)
Glucose-Capillary: 145 mg/dL — ABNORMAL HIGH (ref 70–99)
Glucose-Capillary: 161 mg/dL — ABNORMAL HIGH (ref 70–99)

## 2024-07-28 MED ORDER — SODIUM CHLORIDE 0.9% FLUSH
10.0000 mL | INTRAVENOUS | Status: AC | PRN
  Administered 2024-08-03 – 2024-08-09 (×2): 10 mL

## 2024-07-28 MED ORDER — SODIUM CHLORIDE 0.9% FLUSH
10.0000 mL | Freq: Two times a day (BID) | INTRAVENOUS | Status: AC
  Administered 2024-07-28 – 2024-08-18 (×41): 10 mL
  Administered 2024-08-18: 20 mL
  Administered 2024-08-19 – 2024-08-25 (×11): 10 mL
  Administered 2024-08-26: 20 mL
  Administered 2024-08-27 – 2024-08-30 (×7): 10 mL
  Administered 2024-08-30 – 2024-08-31 (×2): 20 mL
  Administered 2024-08-31 – 2024-09-01 (×2): 10 mL
  Administered 2024-09-01 – 2024-09-02 (×2): 20 mL
  Administered 2024-09-02 – 2024-09-03 (×3): 10 mL

## 2024-07-28 MED ORDER — POTASSIUM PHOSPHATES 15 MMOLE/5ML IV SOLN
30.0000 mmol | Freq: Once | INTRAVENOUS | Status: AC
  Administered 2024-07-28: 30 mmol via INTRAVENOUS
  Filled 2024-07-28: qty 10

## 2024-07-28 MED ORDER — TRAVASOL 10 % IV SOLN
INTRAVENOUS | Status: AC
  Filled 2024-07-28: qty 333

## 2024-07-28 MED ORDER — SODIUM CHLORIDE (PF) 0.9 % IJ SOLN
INTRAMUSCULAR | Status: AC
  Administered 2024-07-28: 10 mL
  Filled 2024-07-28: qty 10

## 2024-07-28 MED ORDER — POTASSIUM CHLORIDE 2 MEQ/ML IV SOLN
INTRAVENOUS | Status: AC
  Filled 2024-07-28 (×3): qty 1000

## 2024-07-28 MED ORDER — MAGNESIUM SULFATE 4 GM/100ML IV SOLN
4.0000 g | Freq: Once | INTRAVENOUS | Status: AC
  Administered 2024-07-28: 4 g via INTRAVENOUS
  Filled 2024-07-28: qty 100

## 2024-07-28 MED ORDER — CHLORHEXIDINE GLUCONATE CLOTH 2 % EX PADS
6.0000 | MEDICATED_PAD | Freq: Every day | CUTANEOUS | Status: AC
  Administered 2024-07-28 – 2024-09-03 (×38): 6 via TOPICAL

## 2024-07-28 MED ORDER — BISACODYL 10 MG RE SUPP
10.0000 mg | Freq: Once | RECTAL | Status: AC
  Administered 2024-07-29: 10 mg via RECTAL
  Filled 2024-07-28: qty 1

## 2024-07-28 NOTE — Plan of Care (Signed)
" °  Problem: Education: Goal: Knowledge of the prescribed therapeutic regimen will improve Outcome: Progressing   Problem: Bowel/Gastric: Goal: Gastrointestinal status for postoperative course will improve Outcome: Progressing   Problem: Cardiac: Goal: Ability to maintain an adequate cardiac output Outcome: Progressing Goal: Will show no evidence of cardiac arrhythmias Outcome: Progressing   Problem: Nutritional: Goal: Will attain and maintain optimal nutritional status Outcome: Progressing   Problem: Neurological: Goal: Will regain or maintain usual level of consciousness Outcome: Progressing   Problem: Clinical Measurements: Goal: Ability to maintain clinical measurements within normal limits Outcome: Progressing Goal: Postoperative complications will be avoided or minimized Outcome: Progressing   Problem: Respiratory: Goal: Will regain and/or maintain adequate ventilation Outcome: Progressing Goal: Respiratory status will improve Outcome: Progressing   Problem: Skin Integrity: Goal: Demonstrates signs of wound healing without infection Outcome: Progressing   Problem: Urinary Elimination: Goal: Will remain free from infection Outcome: Progressing Goal: Ability to achieve and maintain adequate urine output Outcome: Progressing   Problem: Education: Goal: Knowledge of General Education information will improve Description: Including pain rating scale, medication(s)/side effects and non-pharmacologic comfort measures Outcome: Progressing   Problem: Health Behavior/Discharge Planning: Goal: Ability to manage health-related needs will improve Outcome: Progressing   Problem: Clinical Measurements: Goal: Ability to maintain clinical measurements within normal limits will improve Outcome: Progressing Goal: Will remain free from infection Outcome: Progressing Goal: Diagnostic test results will improve Outcome: Progressing Goal: Respiratory complications will  improve Outcome: Progressing Goal: Cardiovascular complication will be avoided Outcome: Progressing   Problem: Activity: Goal: Risk for activity intolerance will decrease Outcome: Progressing   Problem: Nutrition: Goal: Adequate nutrition will be maintained Outcome: Progressing   Problem: Coping: Goal: Level of anxiety will decrease Outcome: Progressing   Problem: Elimination: Goal: Will not experience complications related to bowel motility Outcome: Progressing Goal: Will not experience complications related to urinary retention Outcome: Progressing   Problem: Pain Managment: Goal: General experience of comfort will improve and/or be controlled Outcome: Progressing   Problem: Safety: Goal: Ability to remain free from injury will improve Outcome: Progressing   Problem: Skin Integrity: Goal: Risk for impaired skin integrity will decrease Outcome: Progressing   Problem: Education: Goal: Knowledge of disease or condition will improve Outcome: Progressing Goal: Knowledge of the prescribed therapeutic regimen will improve Outcome: Progressing   Problem: Activity: Goal: Risk for activity intolerance will decrease Outcome: Progressing   Problem: Cardiac: Goal: Will achieve and/or maintain hemodynamic stability Outcome: Progressing   Problem: Clinical Measurements: Goal: Postoperative complications will be avoided or minimized Outcome: Progressing   Problem: Respiratory: Goal: Respiratory status will improve Outcome: Progressing   Problem: Pain Management: Goal: Pain level will decrease Outcome: Progressing   Problem: Skin Integrity: Goal: Wound healing without signs and symptoms infection will improve Outcome: Progressing   "

## 2024-07-28 NOTE — Progress Notes (Addendum)
 Initial Nutrition Assessment  DOCUMENTATION CODES:   Severe malnutrition in context of acute illness/injury  INTERVENTION:  Parenteral Nutrition - titrate to meet estimated nutrition needs.  -Kcal: 1700-1900 kcals -Protein: 100-115g   -Add MVI w/ minerals -Add thiamine 100mg  daily x5 days  Monitor magnesium, potassium, and phosphorus daily for at least 3 days, MD to replete as needed, as pt is at risk for refeeding syndrome given severe malnutrition diagnosis and estimated prolonged inadequate energy intake.   Monitor for diet advancement/tolerance and ability to wean TPN  Monitor bowels and need for scheduled or PRN bowel regimen   NUTRITION DIAGNOSIS:  Severe Malnutrition related to acute illness as evidenced by moderate muscle depletion, energy intake < or equal to 50% for > or equal to 5 days.  GOAL:  Patient will meet greater than or equal to 90% of their needs  MONITOR:  Diet advancement, Supplement acceptance, PO intake, Weight trends  REASON FOR ASSESSMENT:   Consult New TPN/TNA  ASSESSMENT:   Pt with PMH significant for: T2DM, HLD, HTN, sleep apnea, anxiety and depression. 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 Now s/p right middle lobectomy and resection of esophageal diverticulum.  12/29 admitted; OR: middle lobectomy and resection of esophageal diverticulum 12/30 esophagram: small contained esophageal leak 12/31 PICC placed; TPN started  Found to have a small contained esophageal leak on postoperative esophagram. Has chest tube. NGT removed yesterday, as it was coiled in patient's mouth. Plan to start TPN today and repeat esophagram on Friday.  24 Hour Recall B: two scoops of Orgain plant-based protein powder w/ 8oz 2% milk L: skips or one piece of frozen pizza D: protein, starch, veg **consumes either lunch or dinner, but not both**   Discussed patient's intake level with her at bedsides. Endorses  baseline level of intake prior to admission. She states she has been trying to lose weight and manage her diabetes by eating less. Consumes one protein shake in the morning consisting of two scoops of Orgain plan-based protein powder (150 kcals, 21g protein per scoop) and 8oz of 2% milk (122 kcals, 8g protein). She goes on to consume one meal per day in addition to that, but states she does not consume the entire meal. Usually consists of frozen pizza or more balanced in nature (protein, starch, and vegetable), which she cooks primarily for her husband.   Appears she is, on average, consuming less than her estimated needs in both the recent past and chronically. While her Orgain protein powder is desirable for increasing protein intake, it is lower calorie, which is likely contributing to her observed muscle and fat depletions indicating weight loss. Likely meeting <=50% of estimated needs in the context of her cancer diagnosis. Her chronic illness is likely also a contributor to her malnourished state.  Dicussed this concern with her as she endorses not wanting a PICC line placed. Alerted her to her electrolyte trends/derangements from this morning's lab draw are likely suggestive of intake that is lower than her body requires as well as her observed muscle and fat depletions in the context of her diagnosis.   Admit/Current Weight: 70.3 kg - appears stated  Admission weight appears pulled forward from one week ago. Recommend new weight collection to establish/monitor trend this admission. Per chart review, has shown no significant weight changes in last two months. No weight history to review prior to that. She endorses UBW around 150lbs. She reports no BM since before admission. Baseline  is once per day. No bowel regimen currently scheduled or PRN. Monitor bowels and need for initiation of bowel regimen in no BM in next 1-2 days.  Drains/Lines: R pleural: chest tube (28Fr): 634 ml x24  hours  Magnesium sulfate and potassium phosphate repletion ordered today. She is refeeding risk.  Meds: SS Novolog   Labs:  Na+ 132>137 (wdl) K+ 3.5 (wdl) PHOS 1.7 (L) WBC 14.0 (H) CBGs 119-126 x24 hours A1c 5.9 (06/2024)    NUTRITION - FOCUSED PHYSICAL EXAM:  Flowsheet Row Most Recent Value  Orbital Region Mild depletion  Upper Arm Region Mild depletion  Thoracic and Lumbar Region No depletion  Buccal Region Mild depletion  Temple Region No depletion  Clavicle Bone Region Mild depletion  Clavicle and Acromion Bone Region Moderate depletion  Scapular Bone Region No depletion  Dorsal Hand No depletion  Patellar Region Moderate depletion  Anterior Thigh Region Moderate depletion  Posterior Calf Region Moderate depletion  Edema (RD Assessment) None  Hair Reviewed  Eyes Reviewed  Mouth Reviewed  Skin Reviewed  Nails Reviewed    Diet Order:   Diet Order             Diet NPO time specified  Diet effective now             EDUCATION NEEDS:   Education needs have been addressed  Skin:  Skin Assessment: Reviewed RN Assessment  Last BM:  PTA  Height:  Ht Readings from Last 1 Encounters:  07/26/24 5' 3 (1.6 m)   Weight:  Wt Readings from Last 1 Encounters:  07/26/24 70.3 kg   Ideal Body Weight:  52.3 kg  BMI:  Body mass index is 27.46 kg/m.  Estimated Nutritional Needs:   Kcal:  1700-1900  Protein:  100-115g  Fluid:  >1.7L/day  Blair Deaner MS, RD, LDN Registered Dietitian I Clinical Nutrition RD Inpatient Contact Info in Amion

## 2024-07-28 NOTE — Care Management Important Message (Signed)
 Important Message  Patient Details  Name: [name] MRN: [id number] Date of Birth: 01/22/1956   Important Message Given:  Yes - Medicare IM     Claretta Deed 07/28/2024, 10:07 AM

## 2024-07-28 NOTE — Progress Notes (Signed)
 Peripherally Inserted Central Catheter Placement  The IV Nurse has discussed with the patient and/or persons authorized to consent for the patient, the purpose of this procedure and the potential benefits and risks involved with this procedure.  The benefits include less needle sticks, lab draws from the catheter, and the patient may be discharged home with the catheter. Risks include, but not limited to, infection, bleeding, blood clot (thrombus formation), and puncture of an artery; nerve damage and irregular heartbeat and possibility to perform a PICC exchange if needed/ordered by physician.  Alternatives to this procedure were also discussed.  Bard Power PICC patient education guide, fact sheet on infection prevention and patient information card has been provided to patient /or left at bedside.    PICC Placement Documentation  PICC Double Lumen 07/28/24 Right Basilic 36 cm 0 cm (Active)  Indication for Insertion or Continuance of Line Administration of hyperosmolar/irritating solutions (i.e. TPN, Vancomycin , etc.) 07/28/24 1338  Exposed Catheter (cm) 0 cm 07/28/24 1338  Site Assessment Clean, Dry, Intact;Clean 07/28/24 1338  Lumen #1 Status Flushed;Saline locked 07/28/24 1338  Lumen #2 Status Flushed;Blood return noted;Saline locked 07/28/24 1338  Dressing Type Transparent;Securing device 07/28/24 1338  Dressing Status Antimicrobial disc/dressing in place;Clean, Dry, Intact 07/28/24 1338  Line Care Connections checked and tightened 07/28/24 1338  Line Adjustment (NICU/IV Team Only) No 07/28/24 1338  Dressing Intervention Adhesive placed at insertion site (IV team only);Adhesive placed around edges of dressing (IV team/ICU RN only) 07/28/24 1338  Dressing Change Due 08/04/24 07/28/24 1338       [name] 07/28/2024, 1:44 PM

## 2024-07-28 NOTE — Progress Notes (Signed)
 Mobility Specialist: Progress Note   07/28/24 0945  Mobility  Activity Ambulated with assistance;Pivoted/transferred from chair to bed  Level of Assistance Standby assist, set-up cues, supervision of patient - no hands on  Assistive Device Other (Comment) (IV pole)  Distance Ambulated (ft) 800 ft  Activity Response Tolerated well  Mobility Referral Yes  Mobility visit 1 Mobility  Mobility Specialist Start Time (ACUTE ONLY) 1005  Mobility Specialist Stop Time (ACUTE ONLY) 1017  Mobility Specialist Time Calculation (min) (ACUTE ONLY) 12 min    Pt received in bed, pleasant and agreeable to mobility session. SV throughout with IV pole. No complaints. SpO2 90% on RA. Returned to room. Left on EOB with all needs met, call bell in reach.   Ileana Lute Mobility Specialist Please contact via SecureChat or Rehab office at [phone]

## 2024-07-28 NOTE — Progress Notes (Addendum)
 "     [address] [employer] [address]             [phone]      2 Days Post-Op Procedures (LRB): ROBOT ASSISTED RIGHT MIDDLE LOBECTOMY; RESECTION OF ESOPHAGEAL DIVERTICULUM (Right) LYMPH NODE BIOPSY Subjective: Patient reports she feels a lot better this morning than she did last night.   Objective: Vital signs in last 24 hours: Temp:  [98.5 F (36.9 C)-100.7 F (38.2 C)] 98.5 F (36.9 C) (12/31 0307) Pulse Rate:  [90-106] 90 (12/31 0307) Cardiac Rhythm: Sinus tachycardia (12/30 1904) Resp:  [12-18] 18 (12/31 0307) BP: (109-184)/(59-72) 127/59 (12/31 0307) SpO2:  [91 %-96 %] 93 % (12/31 0307)  Hemodynamic parameters for last 24 hours:    Intake/Output from previous day: 12/30 0701 - 12/31 0700 In: -  Out: 834 [Urine:200; Chest Tube:634] Intake/Output this shift: No intake/output data recorded.  General appearance: alert, cooperative, and no distress Neurologic: intact Heart: regular rate and rhythm, S1, S2 normal, no murmur, click, rub or gallop Lungs: Coarse breath sounds that cleared with cough, clear to auscultation after cough Abdomen: soft, non-tender; bowel sounds normal; no masses,  no organomegaly Extremities: extremities normal, atraumatic, no cyanosis or edema Wound: Serosanguinous drainage around the chest tube, other incisions are clean and dry without sign of infection  Lab Results: Recent Labs    07/27/24 0220 07/28/24 0209  WBC 19.8* 14.0*  HGB 12.8 13.3  HCT 37.2 38.4  PLT 100* 96*   BMET:  Recent Labs    07/27/24 0220 07/28/24 0209  NA 132* 137  K 3.7 3.5  CL 100 105  CO2 25 23  GLUCOSE 126* 119*  BUN 19 15  CREATININE 0.85 0.76  CALCIUM 7.7* 8.5*    PT/INR: No results for input(s): LABPROT, INR in the last 72 hours. ABG    Component Value Date/Time   TCO2 25 11/10/2009 0952   CBG (last 3)  Recent Labs    07/27/24 1633 07/27/24 2329 07/28/24 0624  GLUCAP 130* 124* 122*     Assessment/Plan: S/P Procedures (LRB): ROBOT ASSISTED RIGHT MIDDLE LOBECTOMY; RESECTION OF ESOPHAGEAL DIVERTICULUM (Right) LYMPH NODE BIOPSY  Neuro: Pain this AM improved from yesterday, continue current regimen for now. Pain better controlled with dilaudid . Anxiety, IV ativan PRN    CV: HTN, on Avapro  150mg  daily and Toprol  XL 50mg  daily at home for BP. Continue IV Hydralazine  prn for SBP>150 until she can take PO meds. NSR, HR 70s with some ST up to 104.    Pulm: Saturating well on RA. CT output 634cc/24hrs, mostly serosanguinous but some white drainage in the chest tube. CXR with possible small right apical space ad postsurgical changes at the right hilum with ill defined opacity. No air leak seen this AM, chest tube to water seal. Encourage IS and ambulation. Leave CT in place for now.    GI: Sallow study yesterday showed a small esophageal leak in the mid esophagus, looked relatively contained. Will keep patient NPO and start TPN per pharmacy today. Will repeat esophagram on Friday. Some nausea with Dilaudid  that improves with zofran , no vomiting.    Endo: T2DM, preop A1C 5.9. On Jardiance at home. CBGs controlled on SSI, will restart Jardiance at discharge.    Renal: Cr 0.76, stable. UO 200cc/24hrs with IV fluids 100mL/hr. K 3.5, will give a run of potassium once PICC is placed. Mg 1.6, supplement. Reports dark urine, will check UA. May need to  increase fluids.    ID: Likely reactive leukocytosis trending down, Tmax 100.7 on 1 occasion but otherwise 98-99. No clear sign of infection. Will clinically monitor.   Postoperative thrombocytopenia: Plt 96,000, no sign of bleeding. Will monitor.    DVT Prophylaxis: Lovenox   Wound: Serosanguinous drainage around the chest tube, no infection seen. Continue dressing changes with vaseline gauze.    Dispo: Start TPN today, repeat esophagram Friday. Leave chest tube in place.   Addendum: Pharmacy reports the patient was eating normally up  until surgery and recommended not to start TPN since she can go 7 days NPO without it and it is used for more long term needs and woould not be clinically indicated at this time. After discontinuation of the order the dietician reached out and reported the patient was not meeting her nutritional needs prior to surgery and only eating about 1 meal per day to try and promote weight loss. The dietician is recommending initiation of TPN. After discussion with Dr. Kerrin we will start TPN and get PICC placed today.    LOS: 2 days    Con GORMAN Bend, PA-C 07/28/2024   "

## 2024-07-28 NOTE — Progress Notes (Addendum)
 PHARMACY - TOTAL PARENTERAL NUTRITION CONSULT NOTE  Indication: Esophageal leak  Patient Measurements: Height: 5' 3 (160 cm) Weight: 70.3 kg (155 lb) IBW/kg (Calculated) : 52.4 TPN AdjBW (KG): 56.9 Body mass index is 27.46 kg/m.  Assessment:  31 YOF with NSCLC here for RML lobectomy, resection of esophageal diverticulum, and lymph node dissection on 12/29.  Found to have a small contained leak post-op.  Planning to repeat esophagram on 07/30/24 and Pharmacy consulted for TPN.  Patient reports an intentional weight loss of 40 lbs over a couple of years when she was diagnosed with DM.  She typically drinks one Orgain shake every morning for breakfast and eats one meal a day, which could be pizza, spaghetti, chicken, beef or fish along with vegetables and fruits.  Patient was eating normally until her planned surgery.  Glucose / Insulin : hx DM, A1c 5.9% - CBGs well controlled Used 7 units SSI while NPO Electrolytes: Phos 1.7, Mag 1.6, others WNL (K low normal) Renal: SCr < 1, BUN WNL Hepatic: LFTs / tbili WNL, albumin  3.3 Intake / Output; MIVF: UOP 0.1 ml/kg/hr, chest tube , NS 100 ml/hr GI Imaging: none since TPN initiation GI Surgeries / Procedures: none since TPN initiation  Central access: PICC ordered for 07/28/24 TPN start date: N/A  Nutritional Goals: N/A  RD Assessment:    Current Nutrition:  NPO  Plan:  Discuss with CVTS, can hold off on starting TPN.  CVTS will reconsult pending repeat esophagram.   KPhos 30mmol IV and Mag sulfate 4gm IV NS at 100 ml/hr to end at 1614 per order Check labs in AM - defer to MD to address  Chantz Montefusco D. Lendell, PharmD, BCPS, BCCCP 07/28/2024, 9:52 AM  ===========================  Addendum: RD reached out to provider and is concerned with malnutrition.  Discussed cost effectiveness of TPN.  Pharmacy reconsulted to start TPN.  Initiate TPN at 25 ml/hr at 1800 (goal rate 75 ml/hr).  Advance slowly per ASPEN guidelines. Electrolytes in  TPN: Na 50 mEq/L, K 50 mEq/L, Ca 5 mEq/L, Mg 8 mEq/L, Phos 20 mmol/L, Cl:Ac 1:1 Add standard MVI and trace elements to TPN Continue CVTS SSI Q4H Standard TPN labs and nursing care orders  Cailen Texeira D. Lendell, PharmD, BCPS, BCCCP 07/28/2024, 10:25 AM

## 2024-07-29 ENCOUNTER — Inpatient Hospital Stay (HOSPITAL_COMMUNITY)

## 2024-07-29 LAB — GLUCOSE, CAPILLARY
Glucose-Capillary: 121 mg/dL — ABNORMAL HIGH (ref 70–99)
Glucose-Capillary: 135 mg/dL — ABNORMAL HIGH (ref 70–99)
Glucose-Capillary: 159 mg/dL — ABNORMAL HIGH (ref 70–99)
Glucose-Capillary: 188 mg/dL — ABNORMAL HIGH (ref 70–99)

## 2024-07-29 LAB — CBC
HCT: 37.5 % (ref 36.0–46.0)
Hemoglobin: 13.2 g/dL (ref 12.0–15.0)
MCH: 32.4 pg (ref 26.0–34.0)
MCHC: 35.2 g/dL (ref 30.0–36.0)
MCV: 92.1 fL (ref 80.0–100.0)
Platelets: 89 K/uL — ABNORMAL LOW (ref 150–400)
RBC: 4.07 MIL/uL (ref 3.87–5.11)
RDW: 13.5 % (ref 11.5–15.5)
WBC: 13.1 K/uL — ABNORMAL HIGH (ref 4.0–10.5)
nRBC: 0 % (ref 0.0–0.2)

## 2024-07-29 LAB — COMPREHENSIVE METABOLIC PANEL WITH GFR
ALT: 11 U/L (ref 0–44)
AST: 21 U/L (ref 15–41)
Albumin: 3.1 g/dL — ABNORMAL LOW (ref 3.5–5.0)
Alkaline Phosphatase: 64 U/L (ref 38–126)
Anion gap: 8 (ref 5–15)
BUN: 11 mg/dL (ref 8–23)
CO2: 24 mmol/L (ref 22–32)
Calcium: 8.3 mg/dL — ABNORMAL LOW (ref 8.9–10.3)
Chloride: 105 mmol/L (ref 98–111)
Creatinine, Ser: 0.65 mg/dL (ref 0.44–1.00)
GFR, Estimated: >60 mL/min
Glucose, Bld: 168 mg/dL — ABNORMAL HIGH (ref 70–99)
Potassium: 4.2 mmol/L (ref 3.5–5.1)
Sodium: 136 mmol/L (ref 135–145)
Total Bilirubin: 0.6 mg/dL (ref 0.0–1.2)
Total Protein: 5.3 g/dL — ABNORMAL LOW (ref 6.5–8.1)

## 2024-07-29 LAB — MAGNESIUM: Magnesium: 2 mg/dL (ref 1.7–2.4)

## 2024-07-29 LAB — PHOSPHORUS: Phosphorus: 1.2 mg/dL — ABNORMAL LOW (ref 2.5–4.6)

## 2024-07-29 MED ORDER — PANTOPRAZOLE SODIUM 40 MG IV SOLR
40.0000 mg | Freq: Once | INTRAVENOUS | Status: AC
  Administered 2024-07-29: 40 mg via INTRAVENOUS
  Filled 2024-07-29: qty 10

## 2024-07-29 MED ORDER — PANTOPRAZOLE SODIUM 40 MG IV SOLR: 40.0000 mg | Freq: Once | INTRAVENOUS | Status: DC

## 2024-07-29 MED ORDER — SODIUM PHOSPHATES 45 MMOLE/15ML IV SOLN
45.0000 mmol | Freq: Once | INTRAVENOUS | Status: AC
  Administered 2024-07-29: 45 mmol via INTRAVENOUS
  Filled 2024-07-29: qty 15

## 2024-07-29 MED ORDER — TRAVASOL 10 % IV SOLN
INTRAVENOUS | Status: AC
  Filled 2024-07-29: qty 333

## 2024-07-29 MED ORDER — FLEET ENEMA RE ENEM: 1.0000 | ENEMA | Freq: Every day | RECTAL | Status: DC | PRN

## 2024-07-29 MED ORDER — METOPROLOL TARTRATE 5 MG/5ML IV SOLN
5.0000 mg | Freq: Two times a day (BID) | INTRAVENOUS | Status: DC
  Administered 2024-07-29 (×2): 5 mg via INTRAVENOUS
  Filled 2024-07-29 (×2): qty 5

## 2024-07-29 NOTE — Progress Notes (Signed)
 PHARMACY - TOTAL PARENTERAL NUTRITION CONSULT NOTE  Indication: Esophageal leak  Patient Measurements: Height: 5' 3 (160 cm) Weight: 75.2 kg (165 lb 12.6 oz) IBW/kg (Calculated) : 52.4 TPN AdjBW (KG): 56.9 Body mass index is 29.37 kg/m.  Assessment:  49 YOF with NSCLC here for RML lobectomy, resection of esophageal diverticulum, and lymph node dissection on 12/29.  Found to have a small contained leak post-op.  Planning to repeat esophagram on 07/30/24.  Patient reports an intentional weight loss of 40 lbs over a couple of years when she was diagnosed with DM.  She typically drinks one Orgain shake every morning for breakfast and eats one meal a day, which could be pizza, spaghetti, chicken, beef or fish along with vegetables and fruits.  Patient was eating normally until her planned surgery.  Discussed with RD/PA regarding early TPN initiation on a floor patient and cost effectiveness of therapy and RD/PA wish to proceed given concern with patient starving herself and malnutrition.  Pharmacy reconsulted for TPN management.  Glucose / Insulin : hx DM, A1c 5.9% - CBGs trending up with TPN initiation but remain controlled Used 10 units SSI in the past 24 hrs Electrolytes: K up to 4.2 and Phos down to 1.2 post KPhos and also on fluids containing KCL, Mag up to 2 post 4gm IV, others WNL Renal: SCr < 1, BUN WNL Hepatic: LFTs / tbili WNL, albumin  3.1 Intake / Output; MIVF: UOP , chest tube , D51/2NS 30K at 100 ml/hr  GI Imaging: none since TPN initiation GI Surgeries / Procedures: none since TPN initiation  Central access: PICC ordered for 07/28/24 TPN start date: 07/28/24  Nutritional Goals:  Goal TPN rate 75 ml/hr to provide   RD Estimated Needs Total Energy Estimated Needs: 1700-1900 Total Protein Estimated Needs: 100-115g Total Fluid Estimated Needs: >1.7L/day  Current Nutrition:  TPN  Plan:  Continue TPN at 25 ml/hr due to refeeding (goal rate 75  ml/hr) Electrolytes in TPN: tweak Na to 75 mEq/L since hope to trend up once off D51/2NS, K 50 mEq/L, Ca 5 mEq/L, Mg 8 mEq/L, increase Phos to 25 mmol/L, Cl:Ac 1:1 Add standard MVI and trace elements to TPN Continue CVTS SSI Q4H D51/2NS 30K at 100 ml/hr to end at 1044 per MD order NaPhos 45mmol IV since unsure whether IVF with KCL will continue Standard TPN labs on Mon/Thurs and PRN - labs in AM F/u repeat esophagram on 1/2  Karry Causer D. Lendell, PharmD, BCPS, BCCCP 07/29/2024, 7:08 AM

## 2024-07-29 NOTE — Progress Notes (Signed)
 Mobility Specialist Progress Note;   07/29/24 0951  Mobility  Activity Ambulated with assistance  Level of Assistance Standby assist, set-up cues, supervision of patient - no hands on  Assistive Device Other (Comment) (IV pole)  Distance Ambulated (ft) 400 ft  Activity Response Tolerated well  Mobility Referral Yes  Mobility visit 1 Mobility  Mobility Specialist Start Time (ACUTE ONLY) 0951  Mobility Specialist Stop Time (ACUTE ONLY) 1006  Mobility Specialist Time Calculation (min) (ACUTE ONLY) 15 min   Pt agreeable to mobility. Required no physical assistance during ambulation, SV for safety. VSS throughout ambulation. Pt states pain meds prior to session significantly relieved main w/ mobility. Pt returned to sitting on Eob w/ all needs met. RN aware of pts needs at time.   Lauraine Erm Mobility Specialist Please contact via SecureChat or [employer] [phone]

## 2024-07-29 NOTE — Progress Notes (Addendum)
" ° °   °  [address] [place] [employer] [address]             [phone]         3 Days Post-Op Procedures (LRB): ROBOT ASSISTED RIGHT MIDDLE LOBECTOMY; RESECTION OF ESOPHAGEAL DIVERTICULUM (Right) LYMPH NODE BIOPSY  Subjective:  Patient having anxiety.  States I startled her when I knocked on the door.  Her belly pain from last evening is better, she is passing gas, but has not yet been able to have a BM.  Objective: Vital signs in last 24 hours: Temp:  [98.6 F (37 C)-100.6 F (38.1 C)] 99.4 F (37.4 C) (01/01 0711) Pulse Rate:  [88-102] 102 (12/31 2325) Cardiac Rhythm: Sinus tachycardia (01/01 0725) Resp:  [16-19] 19 (01/01 0711) BP: (146-180)/(61-87) 172/68 (01/01 0711) SpO2:  [92 %-96 %] 92 % (01/01 0711) Weight:  [75.2 kg] 75.2 kg (01/01 0203)  Intake/Output from previous day: 12/31 0701 - 01/01 0700 In: 1683.5 [I.V.:681; IV Piggyback:1002.5] Out: 220 [Urine:100; Chest Tube:120]  General appearance: alert, cooperative, and no distress Heart: regular rate and rhythm Lungs: diminished breath sounds right base Abdomen: soft, non-tender; bowel sounds normal; no masses,  no organomegaly Extremities: extremities normal, atraumatic, no cyanosis or edema Wound: clean and dry  Lab Results: Recent Labs    07/28/24 0209 07/29/24 0500  WBC 14.0* 13.1*  HGB 13.3 13.2  HCT 38.4 37.5  PLT 96* 89*   BMET:  Recent Labs    07/28/24 0209 07/29/24 0500  NA 137 136  K 3.5 4.2  CL 105 105  CO2 23 24  GLUCOSE 119* 168*  BUN 15 11  CREATININE 0.76 0.65  CALCIUM 8.5* 8.3*    PT/INR: No results for input(s): LABPROT, INR in the last 72 hours. ABG    Component Value Date/Time   TCO2 25 11/10/2009 0952   CBG (last 3)  Recent Labs    07/28/24 1828 07/28/24 2327 07/29/24 0506  GLUCAP 145* 161* 159*    Assessment/Plan: S/P Procedures (LRB): ROBOT ASSISTED RIGHT MIDDLE LOBECTOMY; RESECTION OF ESOPHAGEAL DIVERTICULUM (Right) LYMPH NODE  BIOPSY  CV- NSR, + HTN- continue Hydralazine  prn.. was on 50 mg Toprol  XL at home.. will add scheduled IV Lopressor  5 mg BID. Can titrate as needed Pulm- CT on water seal, no air leak present. 120 cc output.. will remain in place until esophageal leak resolves S/P Resection Esophageal Diverticulum.SABRA esophogram with small leak... she is NPO on TPN currently, will plan for repeat Esophogram tomorrow Hypokalemia- improved up to 4.2 after supplementation yesterday GI- complains of constipation, NPO currently use suppository/enema prn GU- reports of dark urine, UA sent shows + hemoglobin and bacteruia.SABRA no indication for ABX at this time, I suspect due to traumatic foley injury from OR ID- febrile overnight, however under several blankets, leukocytosis improved to stable from yesterday.. continue to monitor clinically Ambulate    LOS: 3 days    Rocky Shad, PA-C 07/29/2024 8:30 AM  Agree Swallow tomorrow Continue CT management  Tarah Buboltz O Colsen Modi   "

## 2024-07-30 ENCOUNTER — Inpatient Hospital Stay (HOSPITAL_COMMUNITY)

## 2024-07-30 LAB — GLUCOSE, CAPILLARY
Glucose-Capillary: 140 mg/dL — ABNORMAL HIGH (ref 70–99)
Glucose-Capillary: 140 mg/dL — ABNORMAL HIGH (ref 70–99)
Glucose-Capillary: 132 mg/dL — ABNORMAL HIGH (ref 70–99)
Glucose-Capillary: 136 mg/dL — ABNORMAL HIGH (ref 70–99)
Glucose-Capillary: 149 mg/dL — ABNORMAL HIGH (ref 70–99)

## 2024-07-30 LAB — BASIC METABOLIC PANEL WITH GFR
Anion gap: 8 (ref 5–15)
BUN: 10 mg/dL (ref 8–23)
CO2: 26 mmol/L (ref 22–32)
Calcium: 8.4 mg/dL — ABNORMAL LOW (ref 8.9–10.3)
Chloride: 105 mmol/L (ref 98–111)
Creatinine, Ser: 0.64 mg/dL (ref 0.44–1.00)
GFR, Estimated: >60 mL/min
Glucose, Bld: 137 mg/dL — ABNORMAL HIGH (ref 70–99)
Potassium: 3.8 mmol/L (ref 3.5–5.1)
Sodium: 139 mmol/L (ref 135–145)

## 2024-07-30 LAB — MAGNESIUM: Magnesium: 1.7 mg/dL (ref 1.7–2.4)

## 2024-07-30 LAB — SURGICAL PATHOLOGY

## 2024-07-30 LAB — PHOSPHORUS: Phosphorus: 2.1 mg/dL — ABNORMAL LOW (ref 2.5–4.6)

## 2024-07-30 MED ORDER — MAGNESIUM SULFATE 2 GM/50ML IV SOLN
2.0000 g | Freq: Once | INTRAVENOUS | Status: AC
  Administered 2024-07-30: 2 g via INTRAVENOUS
  Filled 2024-07-30: qty 50

## 2024-07-30 MED ORDER — SODIUM CHLORIDE 0.9 % IV SOLN
30.0000 mmol | Freq: Once | INTRAVENOUS | Status: AC
  Administered 2024-07-30: 30 mmol via INTRAVENOUS
  Filled 2024-07-30: qty 10

## 2024-07-30 MED ORDER — CLONIDINE HCL 0.2 MG/24HR TD PTWK
0.2000 mg | MEDICATED_PATCH | TRANSDERMAL | Status: DC
  Administered 2024-07-30: 0.2 mg via TRANSDERMAL
  Filled 2024-07-30: qty 1

## 2024-07-30 MED ORDER — PANTOPRAZOLE SODIUM 40 MG IV SOLR
40.0000 mg | INTRAVENOUS | Status: DC
  Administered 2024-07-30 – 2024-08-13 (×15): 40 mg via INTRAVENOUS
  Filled 2024-07-30 (×15): qty 10

## 2024-07-30 MED ORDER — TRAVASOL 10 % IV SOLN
INTRAVENOUS | Status: AC
  Filled 2024-07-30: qty 466.2

## 2024-07-30 MED ORDER — METOCLOPRAMIDE HCL 5 MG/ML IJ SOLN
5.0000 mg | Freq: Four times a day (QID) | INTRAMUSCULAR | Status: AC
  Administered 2024-07-30 – 2024-07-31 (×6): 5 mg via INTRAVENOUS
  Filled 2024-07-30 (×6): qty 2

## 2024-07-30 MED ORDER — ACETAMINOPHEN 650 MG RE SUPP
650.0000 mg | RECTAL | Status: DC | PRN
  Administered 2024-07-31 (×3): 650 mg via RECTAL
  Filled 2024-07-30 (×3): qty 1

## 2024-07-30 MED ORDER — BISACODYL 10 MG RE SUPP
10.0000 mg | Freq: Every day | RECTAL | Status: AC | PRN
  Administered 2024-08-01 – 2024-08-22 (×2): 10 mg via RECTAL
  Filled 2024-07-30 (×2): qty 1

## 2024-07-30 MED ORDER — METOPROLOL TARTRATE 5 MG/5ML IV SOLN
10.0000 mg | Freq: Four times a day (QID) | INTRAVENOUS | Status: DC
  Administered 2024-07-30 – 2024-08-02 (×11): 10 mg via INTRAVENOUS
  Filled 2024-07-30 (×11): qty 10

## 2024-07-30 MED ORDER — METOPROLOL TARTRATE 5 MG/5ML IV SOLN
5.0000 mg | Freq: Four times a day (QID) | INTRAVENOUS | Status: DC
  Administered 2024-07-30 (×2): 5 mg via INTRAVENOUS
  Filled 2024-07-30 (×2): qty 5

## 2024-07-30 NOTE — Progress Notes (Signed)
" ° °   °  [address] [place] [address]             [phone]    Reviewed the swallow study images with Dr. Kerrin, looks like there is still a small esophageal leak. Will keep her NPO and continue TPN. Will repeat esophagram Monday.   Con GORMAN Bend, PA-C 07/31/23 "

## 2024-07-30 NOTE — Progress Notes (Addendum)
 PHARMACY - TOTAL PARENTERAL NUTRITION CONSULT NOTE  Indication: Esophageal leak  Patient Measurements: Height: 5' 3 (160 cm) Weight: 74.8 kg (164 lb 14.5 oz) IBW/kg (Calculated) : 52.4 TPN AdjBW (KG): 56.9 Body mass index is 29.21 kg/m.  Assessment:  70 YOF with NSCLC here for RML lobectomy, resection of esophageal diverticulum, and lymph node dissection on 12/29.  Found to have a small contained leak post-op.  Planning to repeat esophagram on 07/30/24.  Patient reports an intentional weight loss of 40 lbs over a couple of years when she was diagnosed with DM.  She typically drinks one Orgain shake every morning for breakfast and eats one meal a day, which could be pizza, spaghetti, chicken, beef or fish along with vegetables and fruits.  Patient was eating normally until her planned surgery.  Discussed with RD/PA regarding early TPN initiation on a floor patient and cost effectiveness of therapy and RD/PA wish to proceed given concern with patient starving herself and malnutrition.  Pharmacy reconsulted for TPN management.  Glucose / Insulin : hx DM, A1c 5.9% - CBGs acceptable Used 10 units SSI in the past 24 hrs Electrolytes: refeeding - K down to 3.8 and Phos up to 2.1 post KPhos and also on fluids containing KCL that is now off, Mag down to 1.7, others WNL Renal: SCr < 1, BUN WNL Hepatic: LFTs / tbili WNL, albumin  3.1 Intake / Output; MIVF: UOP 0.8 ml/kg/hr, chest tube , LBM 1/1 GI Imaging: none since TPN initiation GI Surgeries / Procedures: none since TPN initiation  Central access: PICC placed 07/28/24 TPN start date: 07/28/24  Nutritional Goals:  Goal TPN rate 75 ml/hr to provide 100g AA and 1717 kCal  RD Estimated Needs Total Energy Estimated Needs: 1700-1900 Total Protein Estimated Needs: 100-115g Total Fluid Estimated Needs: >1.7L/day  Current Nutrition:  TPN  Plan:  Increase TPN to 35 ml/hr at 1800 to meet ~45% of needs.  Advance slowly over 3-5 days  to get to goal. Electrolytes in TPN: Na 75 mEq/L, K 60 mEq/L, Ca 5 mEq/L, Mg 10 mEq/L, Phos 30 mmol/L, Cl:Ac 1:1 - all lytes increased with increasing TPN rate and may need reduction once out of refeeding period Add standard MVI and trace elements to TPN Continue CVTS SSI Q4H KPhos 30mmol Mag sulfate 2gm IV Standard TPN labs on Mon/Thurs and PRN - labs in AM F/u repeat esophagram on 1/2  Olsen Mccutchan D. Lendell, PharmD, BCPS, BCCCP 07/30/2024, 7:07 AM

## 2024-07-30 NOTE — Progress Notes (Addendum)
 "     [address] [place] [employer] [address]             [phone]      4 Days Post-Op Procedures (LRB): ROBOT ASSISTED RIGHT MIDDLE LOBECTOMY; RESECTION OF ESOPHAGEAL DIVERTICULUM (Right) LYMPH NODE BIOPSY Subjective: Patient reports she feels okay this AM, she denies pain and abdominal pain at the moment.   Objective: Vital signs in last 24 hours: Temp:  [98.4 F (36.9 C)-100.3 F (37.9 C)] 99.4 F (37.4 C) (01/02 0359) Pulse Rate:  [104-112] 112 (01/02 0359) Cardiac Rhythm: Sinus tachycardia (01/01 1901) Resp:  [18-20] 20 (01/02 0359) BP: (155-184)/(68-91) 155/84 (01/02 0629) SpO2:  [91 %-97 %] 92 % (01/02 0359) Weight:  [74.8 kg] 74.8 kg (01/02 0359)  Hemodynamic parameters for last 24 hours:    Intake/Output from previous day: 01/01 0701 - 01/02 0700 In: 1097 [I.V.:832; IV Piggyback:265] Out: 1460 [Urine:1350; Chest Tube:110] Intake/Output this shift: No intake/output data recorded.  General appearance: alert, cooperative, and no distress Neurologic: intact Heart: sinus tachycardia, no murmur Lungs: clear to auscultation bilaterally Abdomen: soft, non-tender; bowel sounds normal; no masses,  no organomegaly Extremities: extremities normal, atraumatic, no cyanosis or edema Wound: Clean and dry dressing in place without sign of infection  Lab Results: Recent Labs    07/28/24 0209 07/29/24 0500  WBC 14.0* 13.1*  HGB 13.3 13.2  HCT 38.4 37.5  PLT 96* 89*   BMET:  Recent Labs    07/29/24 0500 07/30/24 0342  NA 136 139  K 4.2 3.8  CL 105 105  CO2 24 26  GLUCOSE 168* 137*  BUN 11 10  CREATININE 0.65 0.64  CALCIUM 8.3* 8.4*    PT/INR: No results for input(s): LABPROT, INR in the last 72 hours. ABG    Component Value Date/Time   TCO2 25 11/10/2009 0952   CBG (last 3)  Recent Labs    07/29/24 1557 07/29/24 2337 07/30/24 0613  GLUCAP 121* 135* 140*    Assessment/Plan: S/P Procedures (LRB): ROBOT ASSISTED RIGHT  MIDDLE LOBECTOMY; RESECTION OF ESOPHAGEAL DIVERTICULUM (Right) LYMPH NODE BIOPSY  Neuro: Pain controlled this AM. Anxiety, IV ativan PRN    CV: HTN, on Avapro  150mg  daily and Toprol  XL 50mg  daily at home for BP. Continue IV Hydralazine  prn, will change IV Lopressor  to Q6H. ST, HR 116 this AM with some ST up to 120s and few PVCs.   Pulm: Saturating well on RA. CT output 110cc/24hrs, mostly serosanguinous but some white drainage. CXR with stable right apical pneumothorax, small right pleural effusion and stable right perihilar and basilar airspace opacities. No air leak seen this AM, chest tube to water seal. Encourage IS and ambulation. Leave CT in place.   GI: Sallow study with small esophageal leak in the mid esophagus, looked relatively contained. NPO, on TPN per pharmacy. Will repeat esophagram today. Stomach burning improved with Protonix. Complains of some constipation, will add dulcolax suppository daily prn and will add reglan. Has fleet enema ordered daily prn for severe constipation.    Endo: T2DM, preop A1C 5.9. On Jardiance at home. CBGs controlled on SSI, will restart Jardiance at discharge.    Renal: Cr 0.64, stable. UO 1450cc/24hrs. K 3.8, supplement. Mg 1.7, supplement. UA with hemoglobinuria, likely traumatic foley insertion. No sign of UTI.    ID: Likely reactive leukocytosis trending down, Tmax 100.3 and 100.6 on 2 occasions but otherwise 98-99. No clear sign of infection. Will clinically monitor.  Postoperative thrombocytopenia: Last plt 89,000, no sign of bleeding. Will check CBC tomorrow   DVT Prophylaxis: Lovenox    Dispo: Leave CT in place, repeat esophagram today.     LOS: 4 days    Con GORMAN Bend, PA-C 07/30/2024  Patient seen and examined, agree with above Will repeat swallow today Hypertensive and tachycardic - increase IV metoprolol   Elspeth C. Kerrin, MD [facility] [phone]  "

## 2024-07-30 NOTE — Progress Notes (Addendum)
 Nutrition Follow-up  DOCUMENTATION CODES:  Severe malnutrition in context of acute illness/injury  INTERVENTION:  Continue slow advancement of TPN. Continue to monitor magnesium, potassium, and phosphorus daily, MD to replete as needed. Continue Thiamine 100 mg x 5 days total.  NUTRITION DIAGNOSIS:  Severe Malnutrition related to acute illness as evidenced by moderate muscle depletion, energy intake < or equal to 50% for > or equal to 5 days; ongoing.  GOAL:  Patient will meet greater than or equal to 90% of their needs; progressing with TPN advancement.  MONITOR:  Diet advancement, Supplement acceptance, PO intake, Weight trends  REASON FOR ASSESSMENT:  Consult New TPN/TNA  ASSESSMENT:  Pt with PMH significant for: T2DM, HLD, HTN, sleep apnea, anxiety and depression. 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 Now s/p right middle lobectomy and resection of esophageal diverticulum.  Patient reports that she is having stomach pains r/t TPN. She is also having chest pain. RN notified.   TPN initiated 12/31. Patient is showing signs of refeeding syndrome with decrease in phosphorus and magnesium to below desirable ranges. Potassium also decreased, but has remained WNL.  S/P repeat esophagram this morning. Results similar to prior esophagram with contrast extravasation in the midthoracic esophagus. Remains NPO.  TPN increasing to 35 ml/h with new bag today. This will provide 801 kcal and 47 gm protein daily (47% of minimum estimated kcal and protein needs).    Labs reviewed.  K 3.8 <-- 4.2 <-- 3.5 <-- 3.7 Phos 2.1 <-- 1.2 <-- 1.7 Mag 1.7 <-- 2.0 <-- 1.6 CBG: 159-188-121-135-140  Medications reviewed and include novolog , reglan, IV mag sulfate, IV potassium phosphate.  Diet Order:   Diet Order             Diet NPO time specified  Diet effective now                   EDUCATION NEEDS:  Education needs have been  addressed  Skin:  Skin Assessment: Reviewed RN Assessment  Last BM:  1/1  Height:  Ht Readings from Last 1 Encounters:  07/26/24 5' 3 (1.6 m)   Weight:  Wt Readings from Last 1 Encounters:  07/30/24 74.8 kg   Ideal Body Weight:  52.3 kg  BMI:  Body mass index is 29.21 kg/m.  Estimated Nutritional Needs:  Kcal:  1700-1900 Protein:  100-115g Fluid:  >1.7L/day   Suzen HUNT RD, LDN, CNSC Contact via secure chat. If unavailable, use group chat RD Inpatient.

## 2024-07-30 NOTE — Progress Notes (Signed)
 Patient complains of stomach burning sensation all throughout that begins in the evening after TPN is started.  Dwan, PA notified.  Verbal orders given for IV protonix.  Notified of 100.3 temperature.  Verbal order to give rectal tylenol  if temperature gets 101.72F or higher.   0000-patient reports improvement of stomach burning after sitting up and receiving the IV protonix.  Temperature 99.73F.

## 2024-07-30 NOTE — Plan of Care (Signed)
" °  Problem: Respiratory: Goal: Respiratory status will improve Outcome: Progressing   Problem: Clinical Measurements: Goal: Diagnostic test results will improve Outcome: Progressing Goal: Respiratory complications will improve Outcome: Progressing Goal: Cardiovascular complication will be avoided Outcome: Progressing   Problem: Activity: Goal: Risk for activity intolerance will decrease Outcome: Progressing   Problem: Coping: Goal: Level of anxiety will decrease Outcome: Progressing   Problem: Pain Managment: Goal: General experience of comfort will improve and/or be controlled Outcome: Progressing   Problem: Safety: Goal: Ability to remain free from injury will improve Outcome: Progressing   "

## 2024-07-31 ENCOUNTER — Inpatient Hospital Stay (HOSPITAL_COMMUNITY)

## 2024-07-31 LAB — GLUCOSE, CAPILLARY
Glucose-Capillary: 144 mg/dL — ABNORMAL HIGH (ref 70–99)
Glucose-Capillary: 154 mg/dL — ABNORMAL HIGH (ref 70–99)
Glucose-Capillary: 149 mg/dL — ABNORMAL HIGH (ref 70–99)

## 2024-07-31 LAB — BASIC METABOLIC PANEL WITH GFR
Anion gap: 9 (ref 5–15)
BUN: 14 mg/dL (ref 8–23)
CO2: 26 mmol/L (ref 22–32)
Calcium: 8.7 mg/dL — ABNORMAL LOW (ref 8.9–10.3)
Chloride: 106 mmol/L (ref 98–111)
Creatinine, Ser: 0.67 mg/dL (ref 0.44–1.00)
GFR, Estimated: >60 mL/min
Glucose, Bld: 146 mg/dL — ABNORMAL HIGH (ref 70–99)
Potassium: 3.7 mmol/L (ref 3.5–5.1)
Sodium: 141 mmol/L (ref 135–145)

## 2024-07-31 LAB — PHOSPHORUS: Phosphorus: 3 mg/dL (ref 2.5–4.6)

## 2024-07-31 LAB — MAGNESIUM: Magnesium: 2 mg/dL (ref 1.7–2.4)

## 2024-07-31 LAB — CBC
HCT: 34.9 % — ABNORMAL LOW (ref 36.0–46.0)
Hemoglobin: 11.8 g/dL — ABNORMAL LOW (ref 12.0–15.0)
MCH: 31.8 pg (ref 26.0–34.0)
MCHC: 33.8 g/dL (ref 30.0–36.0)
MCV: 94.1 fL (ref 80.0–100.0)
Platelets: 112 K/uL — ABNORMAL LOW (ref 150–400)
RBC: 3.71 MIL/uL — ABNORMAL LOW (ref 3.87–5.11)
RDW: 13.9 % (ref 11.5–15.5)
WBC: 14.3 K/uL — ABNORMAL HIGH (ref 4.0–10.5)
nRBC: 0 % (ref 0.0–0.2)

## 2024-07-31 MED ORDER — PIPERACILLIN-TAZOBACTAM 3.375 G IVPB
3.3750 g | Freq: Three times a day (TID) | INTRAVENOUS | Status: DC
  Administered 2024-07-31 – 2024-08-10 (×30): 3.375 g via INTRAVENOUS
  Filled 2024-07-31 (×30): qty 50

## 2024-07-31 MED ORDER — TRAVASOL 10 % IV SOLN
INTRAVENOUS | Status: AC
  Filled 2024-07-31: qty 532.8

## 2024-07-31 NOTE — Progress Notes (Signed)
 Mobility Specialist Progress Note;   07/31/24 0943  Mobility  Activity Ambulated with assistance  Level of Assistance Standby assist, set-up cues, supervision of patient - no hands on  Assistive Device Other (Comment) (IV pole)  Distance Ambulated (ft) 400 ft  Activity Response Tolerated well  Mobility Referral Yes  Mobility visit 1 Mobility  Mobility Specialist Start Time (ACUTE ONLY) [id number]  Mobility Specialist Stop Time (ACUTE ONLY) 0953  Mobility Specialist Time Calculation (min) (ACUTE ONLY) 10 min   Pt agreeable to mobility. Required no physical assistance during ambulation, SV for safety. C/o pain all over throughout. Pt w/ cough throughout ambulation as well. HR up to 115 bpm w/ exertion. Pt returned back to sitting on EoB and left with all needs met. MD present.   Lauraine Erm Mobility Specialist Please contact via SecureChat or [employer] [phone]

## 2024-07-31 NOTE — Plan of Care (Signed)
" °  Problem: Education: Goal: Knowledge of the prescribed therapeutic regimen will improve Outcome: Progressing   Problem: Bowel/Gastric: Goal: Gastrointestinal status for postoperative course will improve Outcome: Progressing   Problem: Cardiac: Goal: Ability to maintain an adequate cardiac output Outcome: Progressing   Problem: Nutritional: Goal: Will attain and maintain optimal nutritional status Outcome: Progressing   "

## 2024-07-31 NOTE — Progress Notes (Signed)
 5 Days Post-Op Procedures (LRB): ROBOT ASSISTED RIGHT MIDDLE LOBECTOMY; RESECTION OF ESOPHAGEAL DIVERTICULUM (Right) LYMPH NODE BIOPSY Subjective: Anxious, pain improved, ambulated well this AM  Objective: Vital signs in last 24 hours: Temp:  [98.7 F (37.1 C)-100.8 F (38.2 C)] 99.5 F (37.5 C) (01/03 0759) Pulse Rate:  [92-106] 102 (01/03 0359) Cardiac Rhythm: Sinus tachycardia (01/03 0700) Resp:  [17-20] 19 (01/03 0759) BP: (162-176)/(58-83) 162/64 (01/03 0759) SpO2:  [93 %-96 %] 94 % (01/03 0359) Weight:  [74.3 kg] 74.3 kg (01/03 0620)  Hemodynamic parameters for last 24 hours:    Intake/Output from previous day: 01/02 0701 - 01/03 0700 In: 78.6 [I.V.:78.6] Out: 785 [Urine:425; Chest Tube:360] Intake/Output this shift: No intake/output data recorded.  General appearance: alert, cooperative, and no distress Neurologic: intact Heart: regular rate and rhythm Lungs: diminished breath sounds right base Abdomen: normal findings: soft, non-tender Foul smelling drainage from chest tube  Lab Results: Recent Labs    07/29/24 0500 07/31/24 0424  WBC 13.1* 14.3*  HGB 13.2 11.8*  HCT 37.5 34.9*  PLT 89* 112*   BMET:  Recent Labs    07/30/24 0342 07/31/24 0424  NA 139 141  K 3.8 3.7  CL 105 106  CO2 26 26  GLUCOSE 137* 146*  BUN 10 14  CREATININE 0.64 0.67  CALCIUM 8.4* 8.7*    PT/INR: No results for input(s): LABPROT, INR in the last 72 hours. ABG    Component Value Date/Time   TCO2 25 11/10/2009 0952   CBG (last 3)  Recent Labs    07/30/24 1721 07/30/24 2329 07/31/24 0612  GLUCAP 136* 149* 144*    Assessment/Plan: S/P Procedures (LRB): ROBOT ASSISTED RIGHT MIDDLE LOBECTOMY; RESECTION OF ESOPHAGEAL DIVERTICULUM (Right) LYMPH NODE BIOPSY POD # 5 Overall looks good Still with low grade temps and mildly elevated WBC- will start Zosyn  IV Continue NPO Mild protein calorie malnutrition- continue TNA Keep Ct in place Continue ambulation, SCD,  enoxaparin    LOS: 5 days    [name] 07/31/2024

## 2024-07-31 NOTE — Progress Notes (Signed)
 PHARMACY - TOTAL PARENTERAL NUTRITION CONSULT NOTE  Indication: Esophageal leak  Patient Measurements: Height: 5' 3 (160 cm) Weight: 74.3 kg (163 lb 12.8 oz) IBW/kg (Calculated) : 52.4 TPN AdjBW (KG): 56.9 Body mass index is 29.02 kg/m.  Assessment:  5 YOF with NSCLC here for RML lobectomy, resection of esophageal diverticulum, and lymph node dissection on 12/29.  Found to have a small contained leak post-op.  Planning to repeat esophagram on 07/30/24.  Patient reports an intentional weight loss of 40 lbs over a couple of years when she was diagnosed with DM.  She typically drinks one Orgain shake every morning for breakfast and eats one meal a day, which could be pizza, spaghetti, chicken, beef or fish along with vegetables and fruits.  Patient was eating normally until her planned surgery.  Discussed with RD/PA regarding early TPN initiation on a floor patient and cost effectiveness of therapy and RD/PA wish to proceed given concern with patient starving herself and malnutrition.  Pharmacy reconsulted for TPN management.  Glucose / Insulin : hx DM, A1c 5.9% - CBGs 140s > used 8 units SSI in the past 24 hrs Electrolytes: refeeding - K down 3.7, Phos up 3.0 s/p KPhos 30 mmol, Mg up 2.0 s/p Mg 2g, others WNL Renal: SCr < 1, BUN WNL Hepatic: LFTs / tbili WNL, albumin  3.1 Intake / Output; MIVF: UOP 0.2 ml/kg/hr + x4 unmeasured occurences, chest tube 360 mL, LBM 1/01  GI Imaging:  none since TPN initiation GI Surgeries / Procedures:  1/02 esophagram: Persistent contrast extravasation and esophageal leak, similar to prior esophagram on 12/30  Central access: PICC placed 07/28/24 TPN start date: 07/28/24  Nutritional Goals:  Goal TPN rate 75 ml/hr to provide 100g AA and 1717 kCal  RD Estimated Needs Total Energy Estimated Needs: 1700-1900 Total Protein Estimated Needs: 100-115g Total Fluid Estimated Needs: >1.7L/day  Current Nutrition:  TPN 12/31 >> NPO 12/31  >>  Plan: Increase TPN to 40 ml/hr at 1800 to meet ~50% of needs.   > Advancing slowly over 3-5 days to get to goal Electrolytes in TPN: Na 75 mEq/L, K 60 mEq/L, Ca 5 mEq/L, Mg 10 mEq/L, Phos 30 mmol/L, Cl:Ac 1:1  - all lytes increased with increasing TPN rate and may need reduction once out of refeeding period Add standard MVI and trace elements to TPN Thiamine 100mg  x5 days per RD Continue CVTS SSI Q4H Standard TPN labs on Mon/Thurs and PRN, daily labs PRN   Thank you for allowing pharmacy to be a part of this patients care.  Shelba Collier, PharmD, BCPS Clinical Pharmacist

## 2024-07-31 NOTE — Progress Notes (Addendum)
 Chest tube with malodorous smell that can be smelled throughout the room.  Chest tube drainage tan in color. Patient continues to have dark urine output.  Will notify day team.   0630-PA on call notified.

## 2024-08-01 ENCOUNTER — Inpatient Hospital Stay (HOSPITAL_COMMUNITY)

## 2024-08-01 LAB — BASIC METABOLIC PANEL WITH GFR
Anion gap: 8 (ref 5–15)
BUN: 17 mg/dL (ref 8–23)
CO2: 29 mmol/L (ref 22–32)
Calcium: 8.6 mg/dL — ABNORMAL LOW (ref 8.9–10.3)
Chloride: 108 mmol/L (ref 98–111)
Creatinine, Ser: 0.72 mg/dL (ref 0.44–1.00)
GFR, Estimated: >60 mL/min
Glucose, Bld: 137 mg/dL — ABNORMAL HIGH (ref 70–99)
Potassium: 3.6 mmol/L (ref 3.5–5.1)
Sodium: 145 mmol/L (ref 135–145)

## 2024-08-01 LAB — GLUCOSE, CAPILLARY
Glucose-Capillary: 134 mg/dL — ABNORMAL HIGH (ref 70–99)
Glucose-Capillary: 148 mg/dL — ABNORMAL HIGH (ref 70–99)
Glucose-Capillary: 151 mg/dL — ABNORMAL HIGH (ref 70–99)
Glucose-Capillary: 153 mg/dL — ABNORMAL HIGH (ref 70–99)
Glucose-Capillary: 167 mg/dL — ABNORMAL HIGH (ref 70–99)

## 2024-08-01 LAB — CBC
HCT: 34.5 % — ABNORMAL LOW (ref 36.0–46.0)
Hemoglobin: 11.7 g/dL — ABNORMAL LOW (ref 12.0–15.0)
MCH: 33 pg (ref 26.0–34.0)
MCHC: 33.9 g/dL (ref 30.0–36.0)
MCV: 97.2 fL (ref 80.0–100.0)
Platelets: 138 K/uL — ABNORMAL LOW (ref 150–400)
RBC: 3.55 MIL/uL — ABNORMAL LOW (ref 3.87–5.11)
RDW: 13.9 % (ref 11.5–15.5)
WBC: 13.5 K/uL — ABNORMAL HIGH (ref 4.0–10.5)
nRBC: 0 % (ref 0.0–0.2)

## 2024-08-01 LAB — PHOSPHORUS: Phosphorus: 3.8 mg/dL (ref 2.5–4.6)

## 2024-08-01 LAB — MAGNESIUM: Magnesium: 1.9 mg/dL (ref 1.7–2.4)

## 2024-08-01 MED ORDER — FENTANYL 25 MCG/HR TD PT72
1.0000 | MEDICATED_PATCH | TRANSDERMAL | Status: DC
  Administered 2024-08-01 – 2024-08-13 (×5): 1 via TRANSDERMAL
  Filled 2024-08-01 (×5): qty 1

## 2024-08-01 MED ORDER — POTASSIUM CHLORIDE 10 MEQ/100ML IV SOLN
10.0000 meq | INTRAVENOUS | Status: AC
  Administered 2024-08-01 (×4): 10 meq via INTRAVENOUS
  Filled 2024-08-01 (×4): qty 100

## 2024-08-01 MED ORDER — BACITRACIN-NEOMYCIN-POLYMYXIN OINTMENT TUBE
TOPICAL_OINTMENT | Freq: Two times a day (BID) | CUTANEOUS | Status: AC
  Administered 2024-08-02 – 2024-09-03 (×20): 1 via TOPICAL
  Filled 2024-08-01 (×3): qty 14

## 2024-08-01 MED ORDER — TRAVASOL 10 % IV SOLN
INTRAVENOUS | Status: AC
  Filled 2024-08-01: qty 799.2

## 2024-08-01 NOTE — Plan of Care (Signed)
  Problem: Education: Goal: Knowledge of the prescribed therapeutic regimen will improve Outcome: Progressing   Problem: Education: Goal: Knowledge of General Education information will improve Description: Including pain rating scale, medication(s)/side effects and non-pharmacologic comfort measures Outcome: Progressing   

## 2024-08-01 NOTE — Plan of Care (Signed)
" °  Problem: Education: Goal: Knowledge of the prescribed therapeutic regimen will improve Outcome: Progressing   Problem: Bowel/Gastric: Goal: Gastrointestinal status for postoperative course will improve Outcome: Progressing   Problem: Cardiac: Goal: Ability to maintain an adequate cardiac output Outcome: Progressing   Problem: Clinical Measurements: Goal: Ability to maintain clinical measurements within normal limits Outcome: Progressing   "

## 2024-08-01 NOTE — Progress Notes (Signed)
 Mobility Specialist Progress Note;   08/01/24 1020  Mobility  Activity Ambulated with assistance  Level of Assistance Standby assist, set-up cues, supervision of patient - no hands on  Assistive Device Other (Comment) (IV pole)  Distance Ambulated (ft) 400 ft  Activity Response Tolerated well  Mobility Referral Yes  Mobility visit 1 Mobility  Mobility Specialist Start Time (ACUTE ONLY) 1020  Mobility Specialist Stop Time (ACUTE ONLY) 1036  Mobility Specialist Time Calculation (min) (ACUTE ONLY) 16 min   Pt pleasant and agreeable to mobility. Required no physical assistance during ambulation, SV for safety. HR up to 112 bpm w/ activity. No c/o during session. Pt returned back to bed and left with all needs met.   Lauraine Erm Mobility Specialist Please contact via SecureChat or [employer] [phone]

## 2024-08-01 NOTE — Progress Notes (Signed)
 PHARMACY - TOTAL PARENTERAL NUTRITION CONSULT NOTE  Indication: Esophageal leak  Patient Measurements: Height: 5' 3 (160 cm) Weight: 73.3 kg (161 lb 8 oz) IBW/kg (Calculated) : 52.4 TPN AdjBW (KG): 56.9 Body mass index is 28.61 kg/m.  Assessment:  7 YOF with NSCLC here for RML lobectomy, resection of esophageal diverticulum, and lymph node dissection on 12/29.  Found to have a small contained leak post-op.  Planning to repeat esophagram on 07/30/24.  Patient reports an intentional weight loss of 40 lbs over a couple of years when she was diagnosed with DM.  She typically drinks one Orgain shake every morning for breakfast and eats one meal a day, which could be pizza, spaghetti, chicken, beef or fish along with vegetables and fruits.  Patient was eating normally until her planned surgery.  Discussed with RD/PA regarding early TPN initiation on a floor patient and cost effectiveness of therapy and RD/PA wish to proceed given concern with patient starving herself and malnutrition.  Pharmacy reconsulted for TPN management.  Glucose / Insulin : hx DM, A1c 5.9% - CBGs 130-140s > used 8 units SSI in the past 24 hrs Electrolytes: refeeding - K down 3.6, Phos up 3.8, Mg 1.9, others WNL Renal: SCr < 1, BUN WNL Hepatic: LFTs / tbili WNL, albumin  3.1 Intake / Output; MIVF: UOP 0.2 ml/kg/hr + x2 unmeasured occurences, chest tube 100 mL, LBM 1/01  GI Imaging:  none since TPN initiation GI Surgeries / Procedures:  1/02 esophagram: Persistent contrast extravasation and esophageal leak, similar to prior esophagram on 12/30  Central access: PICC placed 07/28/24 TPN start date: 07/28/24  Nutritional Goals:  Goal TPN rate 75 ml/hr to provide 100g AA and 1717 kCal  RD Estimated Needs Total Energy Estimated Needs: 1700-1900 Total Protein Estimated Needs: 100-115g Total Fluid Estimated Needs: >1.7L/day  Current Nutrition:  TPN 12/31 >> NPO 12/31 >>  Plan:  Increase TPN to 60 ml/hr at 1800 to  meet ~75% of needs.   > Advancing slowly over 3-5 days to get to goal Adjust electrolytes in TPN: Na 40 mEq/L, K 50 mEq/L, Ca 5 mEq/L, Mg 10 mEq/L, Phos 12 mmol/L, Cl:Ac 1:1  - all lytes increased with increasing TPN rate and may need reduction once out of refeeding period Give KCl 40 IV x1 outside of TPN Add standard MVI and trace elements to TPN Thiamine 100mg  x5 days per RD Continue CVTS SSI Q4H Standard TPN labs on Mon/Thurs and PRN, daily labs PRN   Thank you for allowing pharmacy to be a part of this patients care.  Shelba Collier, PharmD, BCPS Clinical Pharmacist

## 2024-08-01 NOTE — Progress Notes (Addendum)
 "     [address] [place] [address]             [phone]      6 Days Post-Op Procedures (LRB): ROBOT ASSISTED RIGHT MIDDLE LOBECTOMY; RESECTION OF ESOPHAGEAL DIVERTICULUM (Right) LYMPH NODE BIOPSY Subjective:  Still having pain at the chest tube insertion site and general pain in the right chest.    Objective: Vital signs in last 24 hours: Temp:  [98.5 F (36.9 C)-100.3 F (37.9 C)] 98.6 F (37 C) (01/04 0807) Pulse Rate:  [96-103] 96 (01/04 0807) Cardiac Rhythm: Normal sinus rhythm (01/04 0722) Resp:  [17-20] 18 (01/04 0807) BP: (157-199)/(70-89) 159/71 (01/04 0807) SpO2:  [91 %-97 %] 95 % (01/04 0807) Weight:  [73.3 kg] 73.3 kg (01/04 0239)    Intake/Output from previous day: 01/03 0701 - 01/04 0700 In: -  Out: 400 [Urine:300; Chest Tube:100] Intake/Output this shift: No intake/output data recorded.  General appearance: alert, cooperative, and moderate distress related pain and the prolonged post-op course Neurologic: intact Heart: sinus rhythm Lungs: clear breath sounds and normal resp effort. Has coarse cough.  Abdomen: soft, non-tender Extremities: extremities normal, no edema Wound: Port sites are clean and dry. Has some erythema around the chest tube insertion site.  Lab Results: Recent Labs    07/31/24 0424 08/01/24 0517  WBC 14.3* 13.5*  HGB 11.8* 11.7*  HCT 34.9* 34.5*  PLT 112* 138*   BMET:  Recent Labs    07/31/24 0424 08/01/24 0625  NA 141 145  K 3.7 3.6  CL 106 108  CO2 26 29  GLUCOSE 146* 137*  BUN 14 17  CREATININE 0.67 0.72  CALCIUM 8.7* 8.6*    PT/INR: No results for input(s): LABPROT, INR in the last 72 hours. ABG    Component Value Date/Time   TCO2 25 11/10/2009 0952   CBG (last 3)  Recent Labs    07/31/24 1802 08/01/24 0049 08/01/24 0518  GLUCAP 149* 148* 134*    Assessment/Plan: S/P Procedures (LRB): ROBOT ASSISTED RIGHT MIDDLE LOBECTOMY; RESECTION OF ESOPHAGEAL DIVERTICULUM  (Right) LYMPH NODE BIOPSY  -POD6 right middle lobectomy and resection of esophageal diverticulum. Stable resp status, no air leak and CXR showing good expansion of both lungs. Leaving the CT in place for drainage.   -GI- esophageal leak post resection of esophageal diverticulum. CT drainage has slowed.  NPO, supporting nutrition with TPN. Plan repeat swallow study on Tuesday. On day 2 Zosyn .   -Neuro: Pain controlled primarily with IV Rx since she is NPO. Anxiety managed with IV ativan  PRN    -HTN, on Avapro  150mg  daily and Toprol  XL 50mg  daily at home for BP. Continue IV Hydralazine  prn and  IV Lopressor   Q6H.    -Pulm: Saturating well on RA. CT output 100cc/24hrs, mostly opaque white drainage. CXR with stableNo air leak seen this AM, chest tube to water seal. Encourage IS and ambulation. Leave CT in place for drainage.   -Endo: T2DM, preop A1C 5.9. On Jardiance at home. CBGs controlled on SSI, will restart Jardiance at discharge.    -Renal: normal function, appears euvolemic on exam.   ID: Likely reactive leukocytosis trending down, Tmax 100.3 and 100.6 on 2 occasions but otherwise 98-99. No clear sign of infection. Will clinically monitor.    Postoperative thrombocytopenia: Last plt 89,000, no sign of bleeding. Will check CBC tomorrow   DVT Prophylaxis: Lovenox    Dispo: Leave CT in place, repeat esophagram Tuesday 1//6.  LOS: 6 days   Myron G. Roddenberry, PA-C 08/01/2024 Patient seen and examined, agree with above Path T2,N1- stage IIB Has severe irritation at chest tube site with suture loosened, taped tube to side to limit motion Repeat swallow Tuesday - if still a leak will place esophageal stent On Zosyn - low grade temps Pain remains an issue and sometimes there are delays in getting IV pain meds.  Will place a fentanyl  patch and supplement with IV dilaudid   Silvie Obremski C. Kerrin, MD [facility] [phone]   "

## 2024-08-02 ENCOUNTER — Inpatient Hospital Stay (HOSPITAL_COMMUNITY)

## 2024-08-02 LAB — COMPREHENSIVE METABOLIC PANEL WITH GFR
ALT: 11 U/L (ref 0–44)
AST: 13 U/L — ABNORMAL LOW (ref 15–41)
Albumin: 2.7 g/dL — ABNORMAL LOW (ref 3.5–5.0)
Alkaline Phosphatase: 49 U/L (ref 38–126)
Anion gap: 8 (ref 5–15)
BUN: 18 mg/dL (ref 8–23)
CO2: 29 mmol/L (ref 22–32)
Calcium: 8.5 mg/dL — ABNORMAL LOW (ref 8.9–10.3)
Chloride: 105 mmol/L (ref 98–111)
Creatinine, Ser: 0.72 mg/dL (ref 0.44–1.00)
GFR, Estimated: >60 mL/min
Glucose, Bld: 148 mg/dL — ABNORMAL HIGH (ref 70–99)
Potassium: 3.8 mmol/L (ref 3.5–5.1)
Sodium: 142 mmol/L (ref 135–145)
Total Bilirubin: 0.6 mg/dL (ref 0.0–1.2)
Total Protein: 5 g/dL — ABNORMAL LOW (ref 6.5–8.1)

## 2024-08-02 LAB — TRIGLYCERIDES: Triglycerides: 106 mg/dL

## 2024-08-02 LAB — GLUCOSE, CAPILLARY
Glucose-Capillary: 181 mg/dL — ABNORMAL HIGH (ref 70–99)
Glucose-Capillary: 156 mg/dL — ABNORMAL HIGH (ref 70–99)
Glucose-Capillary: 144 mg/dL — ABNORMAL HIGH (ref 70–99)

## 2024-08-02 LAB — PHOSPHORUS: Phosphorus: 2.9 mg/dL (ref 2.5–4.6)

## 2024-08-02 LAB — MAGNESIUM: Magnesium: 2 mg/dL (ref 1.7–2.4)

## 2024-08-02 MED ORDER — LIDOCAINE-PRILOCAINE 2.5-2.5 % EX CREA
1.0000 | TOPICAL_CREAM | CUTANEOUS | Status: DC | PRN
  Administered 2024-08-02 – 2024-08-19 (×5): 1 via TOPICAL
  Filled 2024-08-02 (×3): qty 5

## 2024-08-02 MED ORDER — LORAZEPAM 2 MG/ML IJ SOLN
1.0000 mg | INTRAMUSCULAR | Status: AC | PRN
  Administered 2024-08-02 – 2024-09-03 (×57): 1 mg via INTRAVENOUS
  Filled 2024-08-02 (×57): qty 1

## 2024-08-02 MED ORDER — TRAVASOL 10 % IV SOLN
INTRAVENOUS | Status: AC
  Filled 2024-08-02: qty 999

## 2024-08-02 MED ORDER — METOPROLOL TARTRATE 5 MG/5ML IV SOLN
15.0000 mg | Freq: Four times a day (QID) | INTRAVENOUS | Status: DC
  Administered 2024-08-02 – 2024-08-09 (×28): 15 mg via INTRAVENOUS
  Filled 2024-08-02 (×28): qty 15

## 2024-08-02 MED ORDER — LORAZEPAM 2 MG/ML IJ SOLN: 1.0000 mg | Freq: Four times a day (QID) | INTRAMUSCULAR | Status: DC | PRN

## 2024-08-02 MED ORDER — LORAZEPAM 2 MG/ML IJ SOLN: 1.0000 mg | Freq: Three times a day (TID) | INTRAMUSCULAR | Status: DC | PRN

## 2024-08-02 MED ORDER — LORAZEPAM 2 MG/ML IJ SOLN
1.0000 mg | Freq: Once | INTRAMUSCULAR | Status: AC
  Administered 2024-08-02: 1 mg via INTRAVENOUS
  Filled 2024-08-02: qty 1

## 2024-08-02 MED ORDER — LIDOCAINE HCL (PF) 1 % IJ SOLN
20.0000 mL | Freq: Once | INTRAMUSCULAR | Status: AC
  Administered 2024-08-02: 10 mL via INTRADERMAL
  Filled 2024-08-02: qty 20

## 2024-08-02 NOTE — Progress Notes (Signed)
 Mobility Specialist Progress Note:    08/02/24 1200  Mobility  Activity Ambulated with assistance  Level of Assistance Standby assist, set-up cues, supervision of patient - no hands on  Assistive Device Other (Comment) (IV Pole)  Distance Ambulated (ft) 500 ft  Activity Response Tolerated well  Mobility Referral Yes  Mobility visit 1 Mobility  Mobility Specialist Start Time (ACUTE ONLY) 1132  Mobility Specialist Stop Time (ACUTE ONLY) 1146  Mobility Specialist Time Calculation (min) (ACUTE ONLY) 14 min   Received pt in bed having no complaints and agreeable to mobility. Pt was asymptomatic throughout ambulation and returned to room w/o fault. Left in bed w/ call bell in reach and all needs met.   Thersia Minder Mobility Specialist  Please contact vis Secure Chat or  Rehab Office [phone]

## 2024-08-02 NOTE — Progress Notes (Signed)
 PHARMACY - TOTAL PARENTERAL NUTRITION CONSULT NOTE  Indication: Esophageal leak  Patient Measurements: Height: 5' 3 (160 cm) Weight: 73.2 kg (161 lb 6 oz) IBW/kg (Calculated) : 52.4 TPN AdjBW (KG): 56.9 Body mass index is 28.59 kg/m.  Assessment:  79 YOF with NSCLC here for RML lobectomy, resection of esophageal diverticulum, and lymph node dissection on 12/29.  Found to have a small contained leak post-op.  Planning to repeat esophagram on 07/30/24.  Patient reports an intentional weight loss of 40 lbs over a couple of years when she was diagnosed with DM.  She typically drinks one Orgain shake every morning for breakfast and eats one meal a day, which could be pizza, spaghetti, chicken, beef or fish along with vegetables and fruits.  Patient was eating normally until her planned surgery.  Discussed with RD/PA regarding early TPN initiation on a floor patient and cost effectiveness of therapy and RD/PA wish to proceed given concern with patient starving herself and malnutrition.  Pharmacy reconsulted for TPN management.  Glucose / Insulin : hx DM, A1c 5.9% - CBGs 140-180s > used 10 units SSI in the past 24 hrs Electrolytes: refeeding - K 3.8 (s/p kcl 40 1/4), Phos 2.9, Mg 2.0, others WNL Renal: SCr < 1, BUN WNL Hepatic: LFTs / tbili WNL, albumin  2.7, TG 106 Intake / Output; MIVF: UOP not fully charted,  LBM 1/04  GI Imaging:  none since TPN initiation GI Surgeries / Procedures:  1/02 esophagram: Persistent contrast extravasation and esophageal leak, similar to prior esophagram on 12/30  Central access: PICC placed 07/28/24 TPN start date: 07/28/24  Nutritional Goals:  Goal TPN rate 75 ml/hr to provide 100g AA and 1717 kCal  RD Estimated Needs Total Energy Estimated Needs: 1700-1900 Total Protein Estimated Needs: 100-115g Total Fluid Estimated Needs: >1.7L/day  Current Nutrition:  TPN 12/31 >> NPO 12/31 >>  Plan:  Increase TPN to 75 ml/hr at 1800 to meet 100% of needs.    > Advanced slowly over 3-5 days to get to goal Adjust electrolytes in TPN: Na 40 mEq/L, K 50 mEq/L, Ca 5 mEq/L, Mg 10 mEq/L, Phos 12 mmol/L, Cl:Ac 1:1  - all lytes increased with increasing TPN rate and may need reduction once out of refeeding period Add standard MVI and trace elements to TPN Thiamine 100mg  x5 days per RD (EOT 08/04/24) Add insulin  regular 8 units to TPN  Continue CVTS SSI Q6H Standard TPN labs on Mon/Thurs and PRN, daily labs PRN   Thank you for allowing pharmacy to be a part of this patients care.  Shelba Collier, PharmD, BCPS Clinical Pharmacist

## 2024-08-02 NOTE — Progress Notes (Addendum)
" ° °   °  [address] [place] [address]             [phone]    7 Days Post-Op Procedures (LRB): ROBOT ASSISTED RIGHT MIDDLE LOBECTOMY; RESECTION OF ESOPHAGEAL DIVERTICULUM (Right) LYMPH NODE BIOPSY  Subjective:  Patient very upset/tearful this morning.  She states she had a horrible night due to anxiety and its stupid the way her anxiety medication is scheduled.  She is also very upset about not being able to drink and just wants her problem fixed.  She doesn't care about eating but wants to be able to drink.  She is coughing up a lot of phlegm.  Her pain control is better.    Objective: Vital signs in last 24 hours: Temp:  [98.6 F (37 C)-100 F (37.8 C)] 98.7 F (37.1 C) (01/05 0303) Pulse Rate:  [82-96] 88 (01/05 0303) Cardiac Rhythm: Normal sinus rhythm (01/05 0701) Resp:  [17-19] 18 (01/05 0303) BP: (157-178)/(63-88) 176/74 (01/05 0303) SpO2:  [92 %-96 %] 96 % (01/05 0303) Weight:  [73.2 kg] 73.2 kg (01/05 0558)  General appearance: alert, cooperative, and no distress Heart: regular rate and rhythm Lungs: coarse throughout Abdomen: soft, non-tender; bowel sounds normal; no masses,  no organomegaly Extremities: extremities normal, atraumatic, no cyanosis or edema Wound: chest tube site with foul smelling purulent drainage  Lab Results: Recent Labs    07/31/24 0424 08/01/24 0517  WBC 14.3* 13.5*  HGB 11.8* 11.7*  HCT 34.9* 34.5*  PLT 112* 138*   BMET:  Recent Labs    08/01/24 0625 08/02/24 0500  NA 145 142  K 3.6 3.8  CL 108 105  CO2 29 29  GLUCOSE 137* 148*  BUN 17 18  CREATININE 0.72 0.72  CALCIUM 8.6* 8.5*    PT/INR: No results for input(s): LABPROT, INR in the last 72 hours. ABG    Component Value Date/Time   TCO2 25 11/10/2009 0952   CBG (last 3)  Recent Labs    08/01/24 1833 08/01/24 2350 08/02/24 0611  GLUCAP 153* 167* 181*    Assessment/Plan: S/P Procedures (LRB): ROBOT ASSISTED RIGHT MIDDLE LOBECTOMY;  RESECTION OF ESOPHAGEAL DIVERTICULUM (Right) LYMPH NODE BIOPSY  CV- NSR, Remains hypertensive- will increase IV Lopressor  15 mg Q6, continue Clonidine  0.2 mg weekly, hydralazine  Pulm- CT to remain in place with esophageal leak... CXR with stable pneumothorax, purulent drainage present.SABRA area of opacification Esophageal leak- remains NPO, on TPN.. for swallow tomorrow.. may ultimately require esophageal stent placement Anxiety- patient very upset about anxiety regimen.. she thinks she can have medication when she wants it.... will change IV ativan  to Q6 prn Pain control imrpoved-on Fentanyl  patch, using dilaudid  Q2 prn.. need to be careful with don't over sedate in setting of naracotic/benzo use.. ID- purulent drainage, leukocytosis stable, now afebrile continue IV zosyn  in setting of esophageal leak    LOS: 7 days    Rocky Shad, PA-C 08/02/2024 7:28 AM Patient seen and examined, agree with above Emotional as noted above Will change Ativan  to q4 to try to decrease delays in responding to anxiety attacks  Elspeth C. Kerrin, MD [facility] [phone]    "

## 2024-08-02 NOTE — Progress Notes (Signed)
 Nutrition Follow-up  DOCUMENTATION CODES:  Severe malnutrition in context of acute illness/injury  INTERVENTION:  Continue TPN, advancing to goal rate today. Thiamine supplementation complete. When able to advance diet, recommend adding Ensure Plus High Protein po TID, each supplement provides 350 kcal and 20 grams of protein.  NUTRITION DIAGNOSIS:  Severe Malnutrition related to acute illness as evidenced by moderate muscle depletion, energy intake < or equal to 50% for > or equal to 5 days; ongoing.  GOAL:  Patient will meet greater than or equal to 90% of their needs; progressing with TPN advancement.  MONITOR:  Diet advancement, Supplement acceptance, PO intake, Weight trends  REASON FOR ASSESSMENT:  Consult New TPN/TNA  ASSESSMENT:  Pt with PMH significant for: T2DM, HLD, HTN, sleep apnea, anxiety and depression. 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 Now s/p right middle lobectomy and resection of esophageal diverticulum.  Patient is doing okay today. Daughter is brushing patient's hair. She really wants something to drink.  Plans for repeat esophagram tomorrow; if the leak persists she may require an esophageal stent.  Chest tube in place. 20 ml output so far today. Remains NPO.  TPN initiated 12/31. Patient was showing signs of refeeding syndrome with decrease in phosphorus and magnesium  to below desirable ranges. Potassium also decreased, but has remained WNL. Potassium, phosphorus, and magnesium  are all WNL today. TPN increasing to 75 ml/h with new bag today. This will provide 1718 kcal and 100 gm protein daily (100% of minimum estimated kcal and protein needs). Insulin  is being added to TPN.  Labs reviewed.  K 3.8 <-- 3.6 <-- 3.7 <-- 3.8 <-- 4.2 <-- 3.5 <-- 3.7 Phos 2.9 <-- 3.8 <-- 3.0 <-- 2.1 <-- 1.2 <-- 1.7 Mag 2.0 <--1.9 <-- 2.0 <-- 1.7 <-- 2.0 <-- 1.6 CBG: 134-151-153-167-181-156  Medications reviewed and  include novolog  SSI q6h, protonix .  Weights reviewed. 75.2 kg (1/1) 73.2 kg (1/5)  Diet Order:   Diet Order             Diet NPO time specified  Diet effective now                 EDUCATION NEEDS:  Education needs have been addressed  Skin:  Skin Assessment: Reviewed RN Assessment  Last BM:  1/5 type 7  Height:  Ht Readings from Last 1 Encounters:  07/26/24 5' 3 (1.6 m)   Weight:  Wt Readings from Last 1 Encounters:  08/02/24 73.2 kg   Ideal Body Weight:  52.3 kg  BMI:  Body mass index is 28.59 kg/m.  Estimated Nutritional Needs:  Kcal:  1700-1900 Protein:  100-115g Fluid:  >1.7L/day   Suzen HUNT RD, LDN, CNSC Contact via secure chat. If unavailable, use group chat RD Inpatient.

## 2024-08-03 ENCOUNTER — Inpatient Hospital Stay (HOSPITAL_COMMUNITY)

## 2024-08-03 LAB — GLUCOSE, CAPILLARY
Glucose-Capillary: 108 mg/dL — ABNORMAL HIGH (ref 70–99)
Glucose-Capillary: 127 mg/dL — ABNORMAL HIGH (ref 70–99)
Glucose-Capillary: 130 mg/dL — ABNORMAL HIGH (ref 70–99)
Glucose-Capillary: 138 mg/dL — ABNORMAL HIGH (ref 70–99)
Glucose-Capillary: 140 mg/dL — ABNORMAL HIGH (ref 70–99)
Glucose-Capillary: 141 mg/dL — ABNORMAL HIGH (ref 70–99)
Glucose-Capillary: 164 mg/dL — ABNORMAL HIGH (ref 70–99)

## 2024-08-03 LAB — BASIC METABOLIC PANEL WITH GFR
Anion gap: 9 (ref 5–15)
BUN: 21 mg/dL (ref 8–23)
CO2: 28 mmol/L (ref 22–32)
Calcium: 8.7 mg/dL — ABNORMAL LOW (ref 8.9–10.3)
Chloride: 105 mmol/L (ref 98–111)
Creatinine, Ser: 0.79 mg/dL (ref 0.44–1.00)
GFR, Estimated: >60 mL/min
Glucose, Bld: 131 mg/dL — ABNORMAL HIGH (ref 70–99)
Potassium: 4 mmol/L (ref 3.5–5.1)
Sodium: 142 mmol/L (ref 135–145)

## 2024-08-03 LAB — MAGNESIUM: Magnesium: 2.1 mg/dL (ref 1.7–2.4)

## 2024-08-03 LAB — PHOSPHORUS: Phosphorus: 3.4 mg/dL (ref 2.5–4.6)

## 2024-08-03 MED ORDER — TRAVASOL 10 % IV SOLN
INTRAVENOUS | Status: AC
  Filled 2024-08-03: qty 999

## 2024-08-03 MED ORDER — INSULIN ASPART 100 UNIT/ML IJ SOLN
0.0000 [IU] | Freq: Four times a day (QID) | INTRAMUSCULAR | Status: DC
  Administered 2024-08-03: 2 [IU] via SUBCUTANEOUS
  Administered 2024-08-03: 3 [IU] via SUBCUTANEOUS
  Administered 2024-08-03 – 2024-08-04 (×4): 2 [IU] via SUBCUTANEOUS
  Administered 2024-08-04: 5 [IU] via SUBCUTANEOUS
  Administered 2024-08-05: 2 [IU] via SUBCUTANEOUS
  Administered 2024-08-05: 3 [IU] via SUBCUTANEOUS
  Administered 2024-08-05 – 2024-08-06 (×3): 2 [IU] via SUBCUTANEOUS
  Filled 2024-08-03 (×5): qty 2
  Filled 2024-08-03: qty 3
  Filled 2024-08-03 (×5): qty 2
  Filled 2024-08-03: qty 3

## 2024-08-03 MED ORDER — CLONIDINE HCL 0.3 MG/24HR TD PTWK: 0.3000 mg | MEDICATED_PATCH | TRANSDERMAL | Status: DC

## 2024-08-03 MED ORDER — IOHEXOL 300 MG/ML  SOLN
100.0000 mL | Freq: Once | INTRAMUSCULAR | Status: AC | PRN
  Administered 2024-08-03: 30 mL via ORAL

## 2024-08-03 MED ORDER — CLONIDINE HCL 0.3 MG/24HR TD PTWK
0.3000 mg | MEDICATED_PATCH | TRANSDERMAL | Status: DC
  Administered 2024-08-03: 0.3 mg via TRANSDERMAL
  Filled 2024-08-03: qty 1

## 2024-08-03 NOTE — Progress Notes (Signed)
 PHARMACY - TOTAL PARENTERAL NUTRITION CONSULT NOTE  Indication: Esophageal leak  Patient Measurements: Height: 5' 3 (160 cm) Weight: 73.3 kg (161 lb 9.6 oz) IBW/kg (Calculated) : 52.4 TPN AdjBW (KG): 56.9 Body mass index is 28.63 kg/m.  Assessment:  83 YOF with NSCLC here for RML lobectomy, resection of esophageal diverticulum, and lymph node dissection on 12/29.  Found to have a small contained leak post-op.  Planning to repeat esophagram on 07/30/24.  Patient reports an intentional weight loss of 40 lbs over a couple of years when she was diagnosed with DM.  She typically drinks one Orgain shake every morning for breakfast and eats one meal a day, which could be pizza, spaghetti, chicken, beef or fish along with vegetables and fruits.  Patient was eating normally until her planned surgery.  Discussed with RD/PA regarding early TPN initiation on a floor patient and cost effectiveness of therapy and RD/PA wish to proceed given concern with patient starving herself and malnutrition.  Pharmacy reconsulted for TPN management.  Glucose / Insulin : hx DM, A1c 5.9% - CBGs 120-140s > used 10 units SSI/24 hrs + 8 units insulin  in TPN Electrolytes: refeeding - K 3.8 (s/p kcl 40 1/4), Phos 2.9, Mg 2.0, others WNL Renal: SCr < 1, BUN WNL Hepatic: LFTs / tbili WNL, albumin  2.7, TG 106 Intake / Output; MIVF: UOP not fully charted,  LBM 1/05  GI Imaging:  none since TPN initiation GI Surgeries / Procedures:  1/02 esophagram: Persistent contrast extravasation and esophageal leak, similar to prior esophagram on 12/30  Central access: PICC placed 07/28/24 TPN start date: 07/28/24  Nutritional Goals:  Goal TPN rate 75 ml/hr to provide 100g AA and 1717 kCal  RD Estimated Needs Total Energy Estimated Needs: 1700-1900 Total Protein Estimated Needs: 100-115g Total Fluid Estimated Needs: >1.7L/day  Current Nutrition:  TPN 12/31 >> NPO 12/31 >>  Plan:  Increase TPN to 75 ml/hr at 1800 to meet  100% of needs.   > Advanced slowly over 3-5 days to get to goal Adjust electrolytes in TPN: Na 40 mEq/L, K 40 mEq/L, Ca 5 mEq/L, Mg 8 mEq/L, Phos 10 mmol/L, Cl:Ac 1:1  - all lytes increased with increasing TPN rate and may need reduction once out of refeeding period Add standard MVI and trace elements to TPN Thiamine 100mg  x5 days per RD (EOT 08/04/24) Add insulin  regular 10 units to TPN  Adjust to mSSI Q6H Standard TPN labs on Mon/Thurs and PRN, daily labs PRN   Thank you for allowing pharmacy to be a part of this patients care.  Shelba Collier, PharmD, BCPS Clinical Pharmacist

## 2024-08-03 NOTE — Progress Notes (Signed)
 We were informed (by radiology department) that the patient aspirated during esophagram and exam was stopped. Unfortunately, unable to view area of concern (esophageal leak). As discussed with Dr. Kerrin, I spoke with patient and will ask speech pathology for evaluation. I did discuss with the patient that there will no esophageal stent placed today but still will likely be required in the future. Patient states she would like to discuss stent placement with her husband.

## 2024-08-03 NOTE — Progress Notes (Signed)
" ° °   °  [address] [place] [address]             [phone]    Discussed with SLP and they are agreeable to do SLP eval tomorrow after esophageal stent has been placed.   Con GORMAN [place], PA-C  "

## 2024-08-03 NOTE — Progress Notes (Signed)
 Patient asked that her sister Jane, who is a education officer, museum, be called and discuss the results of the esophagram with her so that she can better understand the need for a feeding tube and make the decision on whether to have one placed after the stent placement. PA made aware. [place]'s phone number is [phone].

## 2024-08-03 NOTE — Progress Notes (Addendum)
 8 Days Post-Op Procedures (LRB): ROBOT ASSISTED RIGHT MIDDLE LOBECTOMY; RESECTION OF ESOPHAGEAL DIVERTICULUM (Right) LYMPH NODE BIOPSY Subjective: Mostly wants to drink/thirsty , had loose BM this am  Objective: Vital signs in last 24 hours: Temp:  [98.2 F (36.8 C)-98.9 F (37.2 C)] 98.3 F (36.8 C) (01/06 0327) Pulse Rate:  [70-91] 80 (01/06 0327) Cardiac Rhythm: Normal sinus rhythm (01/05 1900) Resp:  [17-19] 17 (01/06 0327) BP: (129-170)/(60-85) 161/69 (01/06 0327) SpO2:  [92 %-98 %] 94 % (01/06 0327) Weight:  [73.3 kg] 73.3 kg (01/06 0500)  Hemodynamic parameters for last 24 hours:    Intake/Output from previous day: 01/05 0701 - 01/06 0700 In: 1853.2 [I.V.:1495.2; IV Piggyback:358] Out: 320 [Urine:200; Chest Tube:120] Intake/Output this shift: No intake/output data recorded.  General appearance: alert, cooperative, and no distress Heart: regular rate and rhythm Lungs: mildly dim in bases Abdomen: soft, non tender Extremities: no edema Wound: incis healing well  Lab Results: Recent Labs    08/01/24 0517  WBC 13.5*  HGB 11.7*  HCT 34.5*  PLT 138*   BMET:  Recent Labs    08/01/24 0625 08/02/24 0500  NA 145 142  K 3.6 3.8  CL 108 105  CO2 29 29  GLUCOSE 137* 148*  BUN 17 18  CREATININE 0.72 0.72  CALCIUM 8.6* 8.5*    PT/INR: No results for input(s): LABPROT, INR in the last 72 hours. ABG    Component Value Date/Time   TCO2 25 11/10/2009 0952   CBG (last 3)  Recent Labs    08/02/24 1737 08/03/24 0130 08/03/24 0542  GLUCAP 144* 141* 127*    Meds Scheduled Meds:  Chlorhexidine  Gluconate Cloth  6 each Topical Daily   cloNIDine   0.2 mg Transdermal Weekly   enoxaparin  (LOVENOX ) injection  40 mg Subcutaneous Daily   fentaNYL   1 patch Transdermal Q72H   insulin  aspart  0-24 Units Subcutaneous Q6H   metoprolol  tartrate  15 mg Intravenous Q6H   neomycin -bacitracin -polymyxin   Topical BID   nicotine   7 mg Transdermal Daily   pantoprazole   (PROTONIX ) IV  40 mg Intravenous Q24H   sodium chloride  flush  10-40 mL Intracatheter Q12H   Continuous Infusions:  piperacillin -tazobactam (ZOSYN )  IV 3.375 g (08/03/24 0545)   promethazine  (PHENERGAN ) injection (IM or IVPB) 150 mL/hr at 08/02/24 1839   TPN ADULT (ION) 75 mL/hr at 08/02/24 1839   PRN Meds:.acetaminophen , bisacodyl , hydrALAZINE , HYDROmorphone  (DILAUDID ) injection, levalbuterol , lidocaine -prilocaine , lip balm, LORazepam , ondansetron  (ZOFRAN ) IV, promethazine  (PHENERGAN ) injection (IM or IVPB), sodium chloride  flush, sodium phosphate   Xrays DG CHEST PORT 1 VIEW Result Date: 08/02/2024 CLINICAL DATA:  Follow-up small right pleural effusion. EXAM: PORTABLE CHEST 1 VIEW COMPARISON:  08/01/2024.  Chest CT dated 06/02/2024. FINDINGS: The right PICC has been advanced with its tip in the inferior aspect of the superior vena cava, proximally 1.2 cm proximal to the superior cavoatrial junction. Mildly decreased right perihilar atelectasis in region of surgical staples. Slight increase in prominence of the interstitial markings in the lower lung zones, accentuated by differences in technique. Normal-sized heart. Minimal right pleural effusion, decreased. Tortuous and partially calcified thoracic aorta. Unremarkable bones. IMPRESSION: 1. Right PICC tip in the inferior aspect of the superior vena cava, 1.2 cm proximal to the superior cavoatrial junction. 2. Mildly decreased right perihilar atelectasis. 3. Minimal right pleural effusion, decreased. 4. Slight increase in prominence of the interstitial markings in the lower lung zones, accentuated by differences in technique, compatible with mild interstitial pulmonary edema. Electronically Signed  By: Elspeth Bathe M.D.   On: 08/02/2024 17:48    Assessment/Plan: S/P Procedures (LRB): ROBOT ASSISTED RIGHT MIDDLE LOBECTOMY; RESECTION OF ESOPHAGEAL DIVERTICULUM (Right) LYMPH NODE BIOPSY POD#8  1 afeb, S BP 129-170,mostly elevated, increase  clonidine  to 0.3,  cont hydralazine  prn,  NSR 2 O2 sats good on RA 3 voiding- not measured, + BM 4 CXR - stable appearance c/w yesterday 5 CT 120 ml/24 h 6 Plan for swallow today, NPO on TPN 7 BS well controlled, SSI 8 labs pending 9 ID- conts zosyn  10 cont anxiety med regimen    LOS: 8 days    Lemond FORBES Cera PA-C Pager [phone] 08/03/2024  Patient seen and examined, agree with above Repeat swallow today.  If still has a leak will plan for stent  Elspeth C. Kerrin, MD [facility] [phone]

## 2024-08-03 NOTE — Progress Notes (Signed)
 Mobility Specialist Progress Note;   08/03/24 1338  Mobility  Activity Ambulated with assistance  Level of Assistance Standby assist, set-up cues, supervision of patient - no hands on  Assistive Device Other (Comment) (IV pole)  Distance Ambulated (ft) 500 ft  Activity Response Tolerated well  Mobility Referral Yes  Mobility visit 1 Mobility  Mobility Specialist Start Time (ACUTE ONLY) 1338  Mobility Specialist Stop Time (ACUTE ONLY) 1351  Mobility Specialist Time Calculation (min) (ACUTE ONLY) 13 min   Pt eager for mobility. Required no physical assistance during ambulation, SV for safety. VSS on RA. Took 1x standing rest break d/t pain, otherwise asx. Pt returned back to bed and left with all needs met. RN notified.   Lauraine Erm Mobility Specialist Please contact via SecureChat or Rehab Office [phone] '

## 2024-08-03 NOTE — Progress Notes (Signed)
 SLP Note  Patient Details Name: Jaelin Devincentis MRN: [id number] DOB: [birth year]   Cancelled treatment:      Discussed case with [place]. Reviewed esophagram images when viewing pharyngeal phase of swallow pt has trace laryngeal aspiration, but after coughing has a large amount of contrast  from deeper trachea is expectorated to upper trachea and pharynx. Unclear if this large amount was aspirated from oropharynx/trachea. It was not seen on prior images and did not occur on last image. Pt has not had any images of pharyngeal dysphagia on prior esophagrams. Given severity of leak, would not recommend SLP swallow eval as PO or barium would have to be given and would be detrimental to pt. SLP will sign off. Pt may benefit from f/u SLP eval once leak has closed.    Jolinda Pinkstaff, Consuelo Fitch 08/03/2024, 2:02 PM

## 2024-08-04 ENCOUNTER — Inpatient Hospital Stay (HOSPITAL_COMMUNITY): Admitting: Certified Registered Nurse Anesthetist

## 2024-08-04 ENCOUNTER — Inpatient Hospital Stay (HOSPITAL_COMMUNITY)

## 2024-08-04 ENCOUNTER — Encounter (HOSPITAL_COMMUNITY): Payer: Self-pay | Admitting: Thoracic Surgery (Cardiothoracic Vascular Surgery)

## 2024-08-04 ENCOUNTER — Encounter (HOSPITAL_COMMUNITY): Admission: RE | Payer: Self-pay | Source: Home / Self Care

## 2024-08-04 DIAGNOSIS — E119 Type 2 diabetes mellitus without complications: Secondary | ICD-10-CM | POA: Diagnosis not present

## 2024-08-04 DIAGNOSIS — K223 Perforation of esophagus: Secondary | ICD-10-CM

## 2024-08-04 DIAGNOSIS — I10 Essential (primary) hypertension: Secondary | ICD-10-CM | POA: Diagnosis not present

## 2024-08-04 DIAGNOSIS — F418 Other specified anxiety disorders: Secondary | ICD-10-CM

## 2024-08-04 HISTORY — PX: ESOPHAGOGASTRODUODENOSCOPY: SHX5428

## 2024-08-04 LAB — CBC
HCT: 33.5 % — ABNORMAL LOW (ref 36.0–46.0)
Hemoglobin: 11.2 g/dL — ABNORMAL LOW (ref 12.0–15.0)
MCH: 31.5 pg (ref 26.0–34.0)
MCHC: 33.4 g/dL (ref 30.0–36.0)
MCV: 94.1 fL (ref 80.0–100.0)
Platelets: 181 K/uL (ref 150–400)
RBC: 3.56 MIL/uL — ABNORMAL LOW (ref 3.87–5.11)
RDW: 13.8 % (ref 11.5–15.5)
WBC: 17.8 K/uL — ABNORMAL HIGH (ref 4.0–10.5)
nRBC: 0 % (ref 0.0–0.2)

## 2024-08-04 LAB — BASIC METABOLIC PANEL WITH GFR
Anion gap: 9 (ref 5–15)
BUN: 21 mg/dL (ref 8–23)
CO2: 27 mmol/L (ref 22–32)
Calcium: 8.4 mg/dL — ABNORMAL LOW (ref 8.9–10.3)
Chloride: 105 mmol/L (ref 98–111)
Creatinine, Ser: 0.8 mg/dL (ref 0.44–1.00)
GFR, Estimated: >60 mL/min
Glucose, Bld: 131 mg/dL — ABNORMAL HIGH (ref 70–99)
Potassium: 3.7 mmol/L (ref 3.5–5.1)
Sodium: 141 mmol/L (ref 135–145)

## 2024-08-04 LAB — PHOSPHORUS: Phosphorus: 3.3 mg/dL (ref 2.5–4.6)

## 2024-08-04 LAB — MAGNESIUM: Magnesium: 2 mg/dL (ref 1.7–2.4)

## 2024-08-04 LAB — GLUCOSE, CAPILLARY
Glucose-Capillary: 119 mg/dL — ABNORMAL HIGH (ref 70–99)
Glucose-Capillary: 138 mg/dL — ABNORMAL HIGH (ref 70–99)
Glucose-Capillary: 139 mg/dL — ABNORMAL HIGH (ref 70–99)
Glucose-Capillary: 143 mg/dL — ABNORMAL HIGH (ref 70–99)
Glucose-Capillary: 150 mg/dL — ABNORMAL HIGH (ref 70–99)
Glucose-Capillary: 205 mg/dL — ABNORMAL HIGH (ref 70–99)

## 2024-08-04 MED ORDER — DEXAMETHASONE SOD PHOSPHATE PF 10 MG/ML IJ SOLN
INTRAMUSCULAR | Status: DC | PRN
  Administered 2024-08-04: 5 mg via INTRAVENOUS

## 2024-08-04 MED ORDER — FENTANYL CITRATE (PF) 100 MCG/2ML IJ SOLN
25.0000 ug | INTRAMUSCULAR | Status: DC | PRN
  Administered 2024-08-04: 25 ug via INTRAVENOUS

## 2024-08-04 MED ORDER — PHENYLEPHRINE 80 MCG/ML (10ML) SYRINGE FOR IV PUSH (FOR BLOOD PRESSURE SUPPORT)
PREFILLED_SYRINGE | INTRAVENOUS | Status: DC | PRN
  Administered 2024-08-04: 80 ug via INTRAVENOUS
  Administered 2024-08-04 (×2): 160 ug via INTRAVENOUS

## 2024-08-04 MED ORDER — SUGAMMADEX SODIUM 200 MG/2ML IV SOLN
INTRAVENOUS | Status: DC | PRN
  Administered 2024-08-04: 300 mg via INTRAVENOUS

## 2024-08-04 MED ORDER — CHLORHEXIDINE GLUCONATE 0.12 % MT SOLN
15.0000 mL | Freq: Once | OROMUCOSAL | Status: AC
  Administered 2024-08-04: 15 mL via OROMUCOSAL
  Filled 2024-08-04: qty 15

## 2024-08-04 MED ORDER — ONDANSETRON HCL 4 MG/2ML IJ SOLN
INTRAMUSCULAR | Status: DC | PRN
  Administered 2024-08-04: 4 mg via INTRAVENOUS

## 2024-08-04 MED ORDER — TRAVASOL 10 % IV SOLN
INTRAVENOUS | Status: AC
  Filled 2024-08-04: qty 999

## 2024-08-04 MED ORDER — LACTATED RINGERS IV SOLN: INTRAVENOUS | Status: DC

## 2024-08-04 MED ORDER — FENTANYL CITRATE (PF) 100 MCG/2ML IJ SOLN
INTRAMUSCULAR | Status: AC
  Filled 2024-08-04: qty 2

## 2024-08-04 MED ORDER — 0.9 % SODIUM CHLORIDE (POUR BTL) OPTIME
TOPICAL | Status: DC | PRN
  Administered 2024-08-04: 1000 mL

## 2024-08-04 MED ORDER — FENTANYL CITRATE (PF) 250 MCG/5ML IJ SOLN
INTRAMUSCULAR | Status: DC | PRN
  Administered 2024-08-04: 50 ug via INTRAVENOUS

## 2024-08-04 MED ORDER — SODIUM CHLORIDE 0.9 % IV SOLN: INTRAVENOUS | Status: DC

## 2024-08-04 MED ORDER — OXYCODONE HCL 5 MG/5ML PO SOLN: 5.0000 mg | Freq: Once | ORAL | Status: DC | PRN

## 2024-08-04 MED ORDER — CHLORHEXIDINE GLUCONATE 0.12 % MT SOLN: 15.0000 mL | Freq: Once | OROMUCOSAL | Status: AC

## 2024-08-04 MED ORDER — FENTANYL CITRATE (PF) 250 MCG/5ML IJ SOLN
INTRAMUSCULAR | Status: AC
  Filled 2024-08-04: qty 5

## 2024-08-04 MED ORDER — POTASSIUM CHLORIDE 10 MEQ/100ML IV SOLN
10.0000 meq | INTRAVENOUS | Status: DC
  Administered 2024-08-04: 10 meq via INTRAVENOUS
  Filled 2024-08-04 (×2): qty 100

## 2024-08-04 MED ORDER — OXYCODONE HCL 5 MG PO TABS: 5.0000 mg | ORAL_TABLET | Freq: Once | ORAL | Status: DC | PRN

## 2024-08-04 MED ORDER — ONDANSETRON HCL 4 MG/2ML IJ SOLN: 4.0000 mg | Freq: Four times a day (QID) | INTRAMUSCULAR | Status: DC | PRN

## 2024-08-04 MED ORDER — ROCURONIUM BROMIDE 10 MG/ML (PF) SYRINGE
PREFILLED_SYRINGE | INTRAVENOUS | Status: DC | PRN
  Administered 2024-08-04: 50 mg via INTRAVENOUS

## 2024-08-04 MED ORDER — PROPOFOL 10 MG/ML IV BOLUS
INTRAVENOUS | Status: AC
  Filled 2024-08-04: qty 20

## 2024-08-04 MED ORDER — CHLORHEXIDINE GLUCONATE 0.12 % MT SOLN
OROMUCOSAL | Status: AC
  Filled 2024-08-04: qty 15

## 2024-08-04 MED ORDER — PROPOFOL 500 MG/50ML IV EMUL
INTRAVENOUS | Status: DC | PRN
  Administered 2024-08-04: 125 ug/kg/min via INTRAVENOUS

## 2024-08-04 MED ORDER — ORAL CARE MOUTH RINSE: 15.0000 mL | Freq: Once | OROMUCOSAL | Status: AC

## 2024-08-04 MED ORDER — PROPOFOL 1000 MG/100ML IV EMUL
INTRAVENOUS | Status: AC
  Filled 2024-08-04: qty 100

## 2024-08-04 MED ORDER — POTASSIUM CHLORIDE 10 MEQ/100ML IV SOLN
10.0000 meq | INTRAVENOUS | Status: AC
  Administered 2024-08-04 (×2): 10 meq via INTRAVENOUS
  Filled 2024-08-04: qty 100

## 2024-08-04 MED ORDER — PROPOFOL 10 MG/ML IV BOLUS
INTRAVENOUS | Status: DC | PRN
  Administered 2024-08-04: 150 mg via INTRAVENOUS

## 2024-08-04 NOTE — Anesthesia Preprocedure Evaluation (Signed)
"                                    Anesthesia Evaluation  Patient identified by MRN, date of birth, ID band Patient awake    Reviewed: Allergy & Precautions, H&P , NPO status , Patient's Chart, lab work & pertinent test results  Airway Mallampati: II   Neck ROM: full    Dental   Pulmonary sleep apnea , former smoker S/p VATS and lobectomy 07/26/24   breath sounds clear to auscultation       Cardiovascular hypertension,  Rhythm:regular Rate:Normal     Neuro/Psych  PSYCHIATRIC DISORDERS Anxiety Depression       GI/Hepatic Esophageal leak   Endo/Other  diabetes, Type 2    Renal/GU      Musculoskeletal   Abdominal   Peds  Hematology   Anesthesia Other Findings   Reproductive/Obstetrics                              Anesthesia Physical Anesthesia Plan  ASA: 3  Anesthesia Plan: General   Post-op Pain Management:    Induction: Intravenous  PONV Risk Score and Plan: 3 and Ondansetron , Dexamethasone , Midazolam  and Treatment may vary due to age or medical condition  Airway Management Planned: Oral ETT  Additional Equipment:   Intra-op Plan:   Post-operative Plan: Extubation in OR  Informed Consent: I have reviewed the patients History and Physical, chart, labs and discussed the procedure including the risks, benefits and alternatives for the proposed anesthesia with the patient or authorized representative who has indicated his/her understanding and acceptance.     Dental advisory given  Plan Discussed with: CRNA, Anesthesiologist and Surgeon  Anesthesia Plan Comments:         Anesthesia Quick Evaluation  "

## 2024-08-04 NOTE — Progress Notes (Signed)
 PHARMACY - TOTAL PARENTERAL NUTRITION CONSULT NOTE  Indication: Esophageal leak  Patient Measurements: Height: 5' 3 (160 cm) Weight: 72.4 kg (159 lb 9.6 oz) IBW/kg (Calculated) : 52.4 TPN AdjBW (KG): 56.9 Body mass index is 28.27 kg/m.  Assessment:  65 YOF with NSCLC here for RML lobectomy, resection of esophageal diverticulum, and lymph node dissection on 12/29.  Found to have a small contained leak post-op.  Planning to repeat esophagram on 07/30/24.  Patient reports an intentional weight loss of 40 lbs over a couple of years when she was diagnosed with DM.  She typically drinks one Orgain shake every morning for breakfast and eats one meal a day, which could be pizza, spaghetti, chicken, beef or fish along with vegetables and fruits.  Patient was eating normally until her planned surgery.  Discussed with RD/PA regarding early TPN initiation on a floor patient and cost effectiveness of therapy and RD/PA wish to proceed given concern with patient starving herself and malnutrition.  Pharmacy reconsulted for TPN management.  Glucose / Insulin : hx DM, A1c 5.9% - CBGs 120-140s > used 9 units SSI/24 hrs + 10 units insulin  in TPN Electrolytes: refeeding - K 3.7, Phos 2.9, Mg 2.0, others WNL Renal: SCr < 1, BUN WNL Hepatic: LFTs / tbili WNL, albumin  2.7, TG 106 Intake / Output; MIVF: UOP not fully charted,  chest tube 90 mL, LBM 1/05  GI Imaging:  none since TPN initiation GI Surgeries / Procedures:  1/02 esophagram: Persistent contrast extravasation and esophageal leak, similar to prior esophagram on 12/30  Central access: PICC placed 07/28/24 TPN start date: 07/28/24  Nutritional Goals:  Goal TPN rate 75 ml/hr to provide 100g AA and 1717 kCal  RD Estimated Needs Total Energy Estimated Needs: 1700-1900 Total Protein Estimated Needs: 100-115g Total Fluid Estimated Needs: >1.7L/day  Current Nutrition:  TPN 12/31 >> NPO 12/31 >>  Plan:  Continue TPN at 75 ml/hr at 1800 to meet  100% of needs.   > Advanced slowly over 3-5 days to get to goal Adjust electrolytes in TPN: Na 40 mEq/L, K 45 mEq/L, Ca 5 mEq/L, Mg 8 mEq/L, Phos 10 mmol/L, Cl:Ac 1:1  Add KCL 30 mEq IV x1 outside of TPN  Add standard MVI and trace elements to TPN Thiamine 100mg  x5 days per RD (EOT 08/04/24) Add insulin  regular 10 units to TPN  Adjust to mSSI Q6H Standard TPN labs on Mon/Thurs and PRN, daily labs PRN   Thank you for allowing pharmacy to be a part of this patients care.  Shelba Collier, PharmD, BCPS Clinical Pharmacist

## 2024-08-04 NOTE — Progress Notes (Signed)
 SLP Cancellation Note  Patient Details Name: [name] MRN: [id number] DOB: [birth year]   Cancelled treatment:       Reason Eval/Treat Not Completed: Other (comment). Pt just went to procedure. Messaged with RN. SLP will f/u tomorrow for swallow eval.    Mikeala Girdler, Consuelo Fitch 08/04/2024, 12:35 PM

## 2024-08-04 NOTE — Transfer of Care (Signed)
 Immediate Anesthesia Transfer of Care Note  Patient: [name]  Procedure(s) Performed: EGD (ESOPHAGOGASTRODUODENOSCOPY) With X  WALLFLEX ESOPHAGEAL STENT PLACEMENT  Patient Location: PACU  Anesthesia Type:General  Level of Consciousness: awake, alert , and oriented  Airway & Oxygen Therapy: Patient Spontanous Breathing and Patient connected to face mask oxygen  Post-op Assessment: Report given to RN, Post -op Vital signs reviewed and stable, and Patient moving all extremities X 4  Post vital signs: Reviewed and stable  Last Vitals:  Vitals Value Taken Time  BP 149/64 08/04/24 15:02  Temp 36.8 C 08/04/24 15:02  Pulse 88 08/04/24 15:08  Resp 19 08/04/24 15:08  SpO2 92 % 08/04/24 15:08  Vitals shown include unfiled device data.  Last Pain:  Vitals:   08/04/24 1234  TempSrc: Oral  PainSc:       Patients Stated Pain Goal: 2 (08/04/24 0442)  Complications: No notable events documented.

## 2024-08-04 NOTE — Anesthesia Procedure Notes (Signed)
 Procedure Name: Intubation Date/Time: 08/04/2024 2:25 PM  Performed by: Lansing Hildegard NOVAK, CRNAPre-anesthesia Checklist: Patient identified, Emergency Drugs available, Suction available and Patient being monitored Patient Re-evaluated:Patient Re-evaluated prior to induction Oxygen Delivery Method: Circle System Utilized Preoxygenation: Pre-oxygenation with 100% oxygen Induction Type: IV induction Ventilation: Mask ventilation without difficulty Laryngoscope Size: Mac and 3 Grade View: Grade I Tube type: Oral Tube size: 8.0 mm Number of attempts: 1 Airway Equipment and Method: Stylet Placement Confirmation: ETT inserted through vocal cords under direct vision, positive ETCO2 and breath sounds checked- equal and bilateral Secured at: 23 cm Tube secured with: Tape Dental Injury: Teeth and Oropharynx as per pre-operative assessment

## 2024-08-04 NOTE — Progress Notes (Signed)
" °   °  [address] [place] [address]             [phone]       No events Vitals:   08/04/24 1158 08/04/24 1234  BP: (!) 177/72 (!) 167/64  Pulse:  78  Resp:  18  Temp:  99 F (37.2 C)  SpO2:  95%   Alert NAD Sinus  EWOB  OR for EGD, esophageal stent placement  [name] [name]   "

## 2024-08-04 NOTE — Op Note (Signed)
" ° °   °  [address] [place] [address]             [phone]      08/04/2024  Patient:  [name] Pre-Op Dx: Esophageal perforation   Post-op Dx:  same Procedure: - Esophagogastroscopy - 125 mm covered esophageal stent placement   Surgeon and Role:      * Anthoney Sheppard, Linnie KIDD, MD - Primary  Anesthesia  general EBL:  none Blood Administration: none Specimen:  none   Counts: correct   Indications: 69yo female s/p esophageal perforation.   Findings: Mucosal defect noted at 30cm from incisors.  Proximal to the staples.  Operative Technique: After the risks, benefits and alternatives were thoroughly discussed, the patient was brought to the operative theatre.  Anesthesia was induced. The patient was prepped and draped in normal sterile fashion.  An appropriate surgical pause was performed, and pre-operative antibiotics were dosed accordingly.  The gastroscope was advanced through the oropharynx into the cervical esophagus under direct visualization.  The scope was passed into the stomach.  The scope was then pulled back, and the esophageal mucosa was visualized.  A mucosal defect was noted at 30 cm from the incisors.    Next a Jag wire was passed through the gastroscope into the stomach with fluoroscopic guidance.  The esophageal stent was then placed over the wire, and positioned under fluoroscopy to cover the mucosal defect.    The patient tolerated the procedure without any immediate complications, and was transferred to the PACU in stable condition.  [name]  "

## 2024-08-05 ENCOUNTER — Inpatient Hospital Stay (HOSPITAL_COMMUNITY)

## 2024-08-05 ENCOUNTER — Other Ambulatory Visit: Payer: Self-pay

## 2024-08-05 LAB — COMPREHENSIVE METABOLIC PANEL WITH GFR
ALT: 15 U/L (ref 0–44)
AST: 16 U/L (ref 15–41)
Albumin: 2.9 g/dL — ABNORMAL LOW (ref 3.5–5.0)
Alkaline Phosphatase: 54 U/L (ref 38–126)
Anion gap: 8 (ref 5–15)
BUN: 21 mg/dL (ref 8–23)
CO2: 26 mmol/L (ref 22–32)
Calcium: 8.4 mg/dL — ABNORMAL LOW (ref 8.9–10.3)
Chloride: 105 mmol/L (ref 98–111)
Creatinine, Ser: 0.76 mg/dL (ref 0.44–1.00)
GFR, Estimated: >60 mL/min
Glucose, Bld: 123 mg/dL — ABNORMAL HIGH (ref 70–99)
Potassium: 4.2 mmol/L (ref 3.5–5.1)
Sodium: 139 mmol/L (ref 135–145)
Total Bilirubin: 0.6 mg/dL (ref 0.0–1.2)
Total Protein: 5.4 g/dL — ABNORMAL LOW (ref 6.5–8.1)

## 2024-08-05 LAB — GLUCOSE, CAPILLARY
Glucose-Capillary: 181 mg/dL — ABNORMAL HIGH (ref 70–99)
Glucose-Capillary: 140 mg/dL — ABNORMAL HIGH (ref 70–99)
Glucose-Capillary: 128 mg/dL — ABNORMAL HIGH (ref 70–99)
Glucose-Capillary: 142 mg/dL — ABNORMAL HIGH (ref 70–99)

## 2024-08-05 LAB — PHOSPHORUS: Phosphorus: 3.3 mg/dL (ref 2.5–4.6)

## 2024-08-05 LAB — MAGNESIUM: Magnesium: 2.1 mg/dL (ref 1.7–2.4)

## 2024-08-05 MED ORDER — BOOST / RESOURCE BREEZE PO LIQD CUSTOM
1.0000 | Freq: Three times a day (TID) | ORAL | Status: DC
  Administered 2024-08-05: 1 via ORAL

## 2024-08-05 MED ORDER — TRAVASOL 10 % IV SOLN
INTRAVENOUS | Status: AC
  Filled 2024-08-05: qty 1080

## 2024-08-05 NOTE — Progress Notes (Signed)
 The speech therapist asked me to review modified barium swallow exam just performed today at around 1:30 pm. The provided images shows reflux of contrast, against gravity from below. For example on RF6 cine loop. Pt. Swallows a single sip of contrast. There is no aspiration ,but within few seconds patient coughs, and there is contrast in the pharynx coming from below, suggesting persistent abnormal communication between esophagus and tracheobronchial tree. Also, cine loop RF21 shows extravasation of contrast in to a small contrast puddle (approximately 3 vertebral length above diaphragm, approx 6-7 cm length), which is entering lower tracheobronchial tree.

## 2024-08-05 NOTE — Plan of Care (Signed)
" °  Problem: Education: Goal: Knowledge of the prescribed therapeutic regimen will improve Outcome: Progressing   Problem: Nutritional: Goal: Will attain and maintain optimal nutritional status Outcome: Progressing   Problem: Neurological: Goal: Will regain or maintain usual level of consciousness Outcome: Progressing   Problem: Respiratory: Goal: Respiratory status will improve Outcome: Progressing   Problem: Urinary Elimination: Goal: Will remain free from infection Outcome: Progressing   Problem: Education: Goal: Knowledge of General Education information will improve Description: Including pain rating scale, medication(s)/side effects and non-pharmacologic comfort measures Outcome: Progressing   Problem: Activity: Goal: Risk for activity intolerance will decrease Outcome: Progressing   "

## 2024-08-05 NOTE — Progress Notes (Addendum)
 PHARMACY - TOTAL PARENTERAL NUTRITION CONSULT NOTE  Indication: Esophageal leak  Patient Measurements: Height: 5' 3 (160 cm) Weight: 72.4 kg (159 lb 9.8 oz) IBW/kg (Calculated) : 52.4 TPN AdjBW (KG): 57.4 Body mass index is 28.27 kg/m.  Assessment:  30 YOF with NSCLC here for RML lobectomy, resection of esophageal diverticulum, and lymph node dissection on 12/29.  Found to have a small contained leak post-op.  Planning to repeat esophagram on 07/30/24.  Patient reports an intentional weight loss of 40 lbs over a couple of years when she was diagnosed with DM.  She typically drinks one Orgain shake every morning for breakfast and eats one meal a day, which could be pizza, spaghetti, chicken, beef or fish along with vegetables and fruits.  Patient was eating normally until her planned surgery.  Discussed with RD/PA regarding early TPN initiation on a floor patient and cost effectiveness of therapy and RD/PA wish to proceed given concern with patient starving herself and malnutrition.  Pharmacy reconsulted for TPN management.  Glucose / Insulin : hx DM, A1c 5.9% - BG 123- 205, used 12 units SSI/24hr, received dexamethasone  1/7  Electrolytes: K 4.2 (Received 30 mEq IV), CoCa 9.3, others wnl  Renal: SCr 0.7, BUN WNL Hepatic: LFTs / tbili WNL, albumin  2.7, TG 106 Intake / Output; MIVF: UOP not fully charted,  chest tube 80 mL, LBM 1/5  GI Imaging:   12/30 esophagram: Positive for contrast extravasation and an esophageal leak in the mid esophagus 1/2 esophagram: Persistent contrast extravasation and esophageal leak, similar to prior 1/6 esophagram: Limited contrast study due to patient aspiration and severe coughing spell. Study aborted. GI Surgeries / Procedures:  12/30 right middle lobectomy, resection esophageal diverticulum, lymph node biopsy  1/7 Esophagogastroscopy, stent placement   Central access: PICC 07/28/24 TPN start date: 07/28/24  Nutritional Goals:  Goal TPN rate 75 ml/hr  to provide 108g AA and 1750 kCal  RD Estimated Needs Total Energy Estimated Needs: 1700-1900 Total Protein Estimated Needs: 100-115g Total Fluid Estimated Needs: >1.7L/day  Current Nutrition:  TPN 12/31 >> NPO 12/31 >>  Plan:  Continue TPN at 75 ml/hr at 1800 to meet 100% of needs  Adjust electrolytes in TPN: increase Na 50 mEq/L, K 45 mEq/L, Ca 5 mEq/L, Mg 8 mEq/L, Phos 10 mmol/L, Cl:Ac 1:1   Add standard MVI and trace elements to TPN  Remove insulin  from TPN given SSI use is minimal with sugars on the low end of range. BG slightly higher 1/7-1/8 due to dexamethasone  1/7 Continue mSSI Q6H and stop if not needing  Standard TPN labs on Mon/Thurs and PRN    Thank you for allowing pharmacy to be a part of this patients care. Jinnie Door, PharmD, BCPS, BCCP Clinical Pharmacist  Please check AMION for all [facility] Pharmacy phone numbers After 10:00 PM, call Main Pharmacy [phone]

## 2024-08-05 NOTE — Evaluation (Signed)
 Clinical/Bedside Swallow Evaluation Patient Details  Name: [name] MRN: [id number] Date of Birth: 10/02/1955  Today's Date: 08/05/2024 Time: SLP Start Time (ACUTE ONLY): 0910 SLP Stop Time (ACUTE ONLY): 0940 SLP Time Calculation (min) (ACUTE ONLY): 30 min  Past Medical History:  Past Medical History:  Diagnosis Date   Anxiety    Depression    Diabetes mellitus without complication (HCC)    Dysrhythmia    PVCs, SVT   Heart murmur    1981; when pt was pregnant   Hyperlipidemia    Hypertension    Sleep apnea    Tachycardia    Tuberculosis 1980   was treated; latent   Past Surgical History:  Past Surgical History:  Procedure Laterality Date   APPENDECTOMY     CESAREAN SECTION     ENDOBRONCHIAL ULTRASOUND Bilateral 06/03/2024   Procedure: ENDOBRONCHIAL ULTRASOUND (EBUS);  Surgeon: Catherine Cools, MD;  Location[facility] ENDOSCOPY;  Service: Pulmonary;  Laterality: Bilateral;   LOBECTOMY, LUNG, ROBOT-ASSISTED, USING VATS Right 07/26/2024   Procedure: ROBOT ASSISTED RIGHT MIDDLE LOBECTOMY; RESECTION OF ESOPHAGEAL DIVERTICULUM;  Surgeon: Kerrin Elspeth BROCKS, MD;  Location: [facility] OR;  Service: Thoracic;  Laterality: Right;  ROBOTIC RIGHT MIDDLE LOBECTOMY   LYMPH NODE BIOPSY  07/26/2024   Procedure: LYMPH NODE BIOPSY;  Surgeon: Kerrin Elspeth BROCKS, MD;  Location: [facility] OR;  Service: Thoracic;;   TONSILLECTOMY  1974   VIDEO BRONCHOSCOPY WITH ENDOBRONCHIAL NAVIGATION Right 06/03/2024   Procedure: VIDEO BRONCHOSCOPY WITH ENDOBRONCHIAL NAVIGATION;  Surgeon: Catherine Cools, MD;  Location: [facility] ENDOSCOPY;  Service: Pulmonary;  Laterality: Right;   HPI:  Pt presented for surgery r/t diagnosis of non-small cell carcinoma of her right middle lobe. Now s/p right middle lobectomy and resection of esophageal diverticulum on 12/29. Pt has had persistent esopahgeal leak since surgery and three esophagrams. On the last, pt had trace laryngeal aspiration of thin contrast with sensation. Cough resulted in  significant expectoration of contrast from distal airways. Pt had esophageal stent placed on 1/7 and has been cleared by CTS for ingestion of barium and up to a pureed diet with thin liquids. SLP asked to evaluate swallowing for safety with diet initiation.    Assessment / Plan / Recommendation  Clinical Impression  Pt demonstrates coughing with sips of water. She reports coughing has happened every time she has tried to swallow something since her surgery. SLP will proceed with MBS today to examine pharyngeal phase of swallowing and suggest compensations to protect airway, avoid coughing if possible and help pt initiate up to a pureed diet. Discussed procedure with Pt and PA who reports pt is cleared for barium ingestion after successful stent placement yesterday. SLP Visit Diagnosis: Dysphagia, unspecified (R13.10)    Aspiration Risk  Mild aspiration risk    Diet Recommendation           Other Recommendations       Swallow Evaluation Recommendations     Assistance Recommended at Discharge    Functional Status Assessment    Frequency and Duration            Prognosis        Swallow Study   General HPI: Pt presented for surgery r/t diagnosis of non-small cell carcinoma of her right middle lobe. Now s/p right middle lobectomy and resection of esophageal diverticulum on 12/29. Pt has had persistent esopahgeal leak since surgery and three esophagrams. On the last, pt had trace laryngeal aspiration of thin contrast with sensation. Cough resulted in significant expectoration of contrast  from distal airways. Pt had esophageal stent placed on 1/7 and has been cleared by CTS for ingestion of barium and up to a pureed diet with thin liquids. SLP asked to evaluate swallowing for safety with diet initiation. Type of Study: Bedside Swallow Evaluation Previous Swallow Assessment: esophagrams Diet Prior to this Study: NPO;TPN Temperature Spikes Noted: No Respiratory Status: Room air History of  Recent Intubation: Yes Total duration of intubation (days): 2 days (on day on 12/28 and one day on 1/7 for procedures) Date extubated: 08/04/24 Behavior/Cognition: Alert;Cooperative;Pleasant mood Oral Cavity Assessment: Within Functional Limits Oral Care Completed by SLP: No Oral Cavity - Dentition: Adequate natural dentition Vision: Functional for self-feeding Self-Feeding Abilities: Able to feed self Patient Positioning: Upright in bed Baseline Vocal Quality: Hoarse Volitional Cough: Congested Volitional Swallow: Able to elicit    Oral/Motor/Sensory Function Overall Oral Motor/Sensory Function: Within functional limits   Ice Chips     Thin Liquid Thin Liquid: Impaired Pharyngeal  Phase Impairments: Cough - Immediate    Nectar Thick Nectar Thick Liquid: Not tested   Honey Thick Honey Thick Liquid: Not tested   Puree Puree: Not tested   Solid     Solid: Not tested      Maurisio Ruddy, Consuelo Fitch 08/05/2024,9:51 AM

## 2024-08-05 NOTE — Progress Notes (Addendum)
 Nutrition Brief Note  Met with patient and her daughter at bedside.They had questions regarding nutrition support as patient's sister is a speech therapist and was encouraging feeding tube placement and initiation of enteral nutrition support.    TPN now at goal rate. Discussed implication for TPN vs enteral nutrition and the expected trajectory of diet advancement. Patient and her daughter verbalized understanding and relieved that enteral nutrition not currently indicated. Did, however, educate regarding importance of better meeting calorie and protein needs on diet advancement to promote healing, progression with therapy, and prevent loss of lean body mass.   Patient did experience aspiration event during attempted esophagram on Tuesday (08/04/2023). Esophagram aborted at that time and esophageal stent placed yesterday (08/05/2023).  Speech has evaluated patient today and plans for MBS later this afternoon. Noted she continues with excess sputum production she has difficulty expelling at times. Will monitor for outcomes of pending studies and modify plan of care as indicated. Hoping for diet advancement and ability to wean TPN in coming days.    INTERVENTION:  Continue TPN, per pharmacy -MVI w/ minerals added When able to advance diet, recommend Boost Breeze po TID, each supplement provides 250 kcal and 9 grams of protein  Monitor intake and ability to wean nutrition support   NUTRITION DIAGNOSIS:  Severe Malnutrition related to acute illness as evidenced by moderate muscle depletion, energy intake < or equal to 50% for > or equal to 5 days - remains applicable   GOAL:  Patient will meet greater than or equal to 90% of their needs; progressing with TPN advancement - being met via TPN  Blair Deaner MS, RD, LDN Registered Dietitian I Clinical Nutrition RD Inpatient Contact Info in Amion

## 2024-08-05 NOTE — Progress Notes (Signed)
" ° °   °  [address] [place] [employer] [address]             [phone]         1 Day Post-Op Procedures (LRB): EGD (ESOPHAGOGASTRODUODENOSCOPY) With X  WALLFLEX ESOPHAGEAL STENT PLACEMENT (N/A)  Subjective:  Patient is good spirits.  She continues to cough a lot with sputum production but does have difficulty getting it expelled sometimes.  She has moved her bowels.  Objective: Vital signs in last 24 hours: Temp:  [98 F (36.7 C)-99 F (37.2 C)] 98 F (36.7 C) (01/08 0338) Pulse Rate:  [73-89] 76 (01/08 0338) Cardiac Rhythm: Normal sinus rhythm (01/07 1900) Resp:  [15-20] 16 (01/08 0338) BP: (107-185)/(48-85) 152/48 (01/08 0338) SpO2:  [92 %-96 %] 95 % (01/08 0338) Weight:  [72.4 kg] 72.4 kg (01/07 1234)  Intake/Output from previous day: 01/07 0701 - 01/08 0700 In: 1648.7 [I.V.:1427.4; IV Piggyback:221.3] Out: 580 [Urine:500; Chest Tube:80]  General appearance: alert, cooperative, and no distress Heart: regular rate and rhythm Lungs: clear to auscultation bilaterally Abdomen: soft, non-tender; bowel sounds normal; no masses,  no organomegaly Extremities: extremities normal, atraumatic, no cyanosis or edema Wound: clean and dry  Lab Results: Recent Labs    08/04/24 0500  WBC 17.8*  HGB 11.2*  HCT 33.5*  PLT 181   BMET:  Recent Labs    08/04/24 0500 08/05/24 0407  NA 141 139  K 3.7 4.2  CL 105 105  CO2 27 26  GLUCOSE 131* 123*  BUN 21 21  CREATININE 0.80 0.76  CALCIUM 8.4* 8.4*    PT/INR: No results for input(s): LABPROT, INR in the last 72 hours. ABG    Component Value Date/Time   TCO2 25 11/10/2009 0952   CBG (last 3)  Recent Labs    08/04/24 1716 08/04/24 2350 08/05/24 0609  GLUCAP 150* 205* 181*    Assessment/Plan: S/P Procedures (LRB): EGD (ESOPHAGOGASTRODUODENOSCOPY) With X  WALLFLEX ESOPHAGEAL STENT PLACEMENT (N/A)  CV- NSR, BP improved- continue IV Lopressor , Clonidine , hydralazine   prn Pulm- CT on waterseal w/o air leak..80 cc brownish drainage yesterday.. CXR is essentially unchanged GI- S/P Esophageal stent placement.. need SLP evaluation this morning due to aspiration during last swallow.. hopefully she will pass and we can start liquids today.. continue TPN Anxiety- continue prn ativan , currently well controlled Pain management- well controlled currently on Fentanyl  patch, dilaudid  Ambulate Lovenox  for DVT prophylaxis   LOS: 10 days    Rocky Shad, PA-C 08/05/2024 7:33 AM    "

## 2024-08-05 NOTE — Progress Notes (Signed)
 Modified Barium Swallow Study  Patient Details  Name: Essica Kiker MRN: [id number] Date of Birth: 1955-10-22  Today's Date: 08/05/2024  Modified Barium Swallow completed.  Full report located under Chart Review in the Imaging Section.  History of Present Illness Pt presented for surgery r/t diagnosis of non-small cell carcinoma of her right middle lobe. Now s/p right middle lobectomy and resection of esophageal diverticulum on 12/29. Pt has had persistent esopahgeal leak since surgery and three esophagrams. On the last, pt had trace laryngeal aspiration of thin contrast with sensation. Cough resulted in significant expectoration of contrast from distal airways. Pt had esophageal stent placed on 1/7 and has been cleared by CTS for ingestion of barium and up to a pureed diet with thin liquids. SLP asked to evaluate swallowing for safety with diet initiation.   Clinical Impression Pt demonstrates a oropharyngeal function WNL. Pt has two instances of premature spillage of liquids to her glottis prior to the swallow, pt senses and ejects (PAS3). Otherwise oropharyngeal swallowing is timely and adequate. Trialed a chin tuck to reduce premature spillage, also an oral hold. Both strategies are effective. Note that pt had immediate hard coughing after each sip of barium and in some cases, has retrograde movement of barium from the lower trachea, expectorated all the way to the oropharynx and spit out or reswallowed. Esophagal sweep showed appearance of ongoing passage of barium from esophagus to lower airways. SLP will f/u with pt when she is cleared for oral diet by MD.    Reported finding to Rocky Shad PA and asked radiologist to review. Dr Kimberlee added a progress note to chart stating The speech therapist asked me to review modified barium swallow exam just performed today at around 1:30 pm. The provided images shows reflux of contrast, against gravity from below. For example on RF6 cine loop. Pt. Swallows  a single sip of contrast. There is no aspiration ,but within few seconds patient coughs, and there is contrast in the pharynx coming from below, suggesting persistent abnormal communication between esophagus and tracheobronchial tree. Also, cine loop RF21 shows extravasation of contrast in to a small contrast puddle (approximately 3 vertebral length above diaphragm, approx 6-7 cm length), which is entering lower tracheobronchial tree.  He also added an indication on RF21 and a key image. Factors that may increase risk of adverse event in presence of aspiration Noe & Lianne 2021):    Swallow Evaluation Recommendations Recommendations: PO diet (When cleared by MD, pt is safe for any texture of liquid or solid from SLP standpoint.) PO Diet Recommendation: Regular;Thin liquids (Level 0) Liquid Administration via: Cup;Straw Medication Administration: Whole meds with liquid Supervision: Patient able to self-feed Postural changes: Position pt fully upright for meals, Chin tuck if desired      Kaimana Neuzil, Consuelo Fitch 08/05/2024,3:02 PM

## 2024-08-05 NOTE — Anesthesia Postprocedure Evaluation (Signed)
"   Anesthesia Post Note  Patient: [name]  Procedure(s) Performed: EGD (ESOPHAGOGASTRODUODENOSCOPY) With X  WALLFLEX ESOPHAGEAL STENT PLACEMENT     Patient location during evaluation: PACU Anesthesia Type: General Level of consciousness: awake and alert Pain management: pain level controlled Vital Signs Assessment: post-procedure vital signs reviewed and stable Respiratory status: spontaneous breathing, nonlabored ventilation, respiratory function stable and patient connected to nasal cannula oxygen Cardiovascular status: blood pressure returned to baseline and stable Postop Assessment: no apparent nausea or vomiting Anesthetic complications: no   No notable events documented.  Last Vitals:  Vitals:   08/05/24 1456 08/05/24 1500  BP: (!) 165/68   Pulse:  79  Resp:  15  Temp:  36.9 C  SpO2:  95%    Last Pain:  Vitals:   08/05/24 1817  TempSrc:   PainSc: 8                  Neeka Urista S      "

## 2024-08-06 ENCOUNTER — Encounter (HOSPITAL_COMMUNITY): Payer: Self-pay | Admitting: Thoracic Surgery (Cardiothoracic Vascular Surgery)

## 2024-08-06 ENCOUNTER — Inpatient Hospital Stay (HOSPITAL_COMMUNITY)

## 2024-08-06 ENCOUNTER — Inpatient Hospital Stay (HOSPITAL_COMMUNITY): Admitting: Certified Registered Nurse Anesthetist

## 2024-08-06 ENCOUNTER — Encounter (HOSPITAL_COMMUNITY): Admission: RE | Payer: Self-pay | Source: Home / Self Care

## 2024-08-06 DIAGNOSIS — T85528A Displacement of other gastrointestinal prosthetic devices, implants and grafts, initial encounter: Secondary | ICD-10-CM | POA: Diagnosis not present

## 2024-08-06 DIAGNOSIS — K223 Perforation of esophagus: Secondary | ICD-10-CM | POA: Diagnosis not present

## 2024-08-06 DIAGNOSIS — Z87891 Personal history of nicotine dependence: Secondary | ICD-10-CM

## 2024-08-06 DIAGNOSIS — F418 Other specified anxiety disorders: Secondary | ICD-10-CM | POA: Diagnosis not present

## 2024-08-06 DIAGNOSIS — I10 Essential (primary) hypertension: Secondary | ICD-10-CM | POA: Diagnosis not present

## 2024-08-06 HISTORY — PX: ESOPHAGOGASTRODUODENOSCOPY: SHX5428

## 2024-08-06 LAB — GLUCOSE, CAPILLARY
Glucose-Capillary: 147 mg/dL — ABNORMAL HIGH (ref 70–99)
Glucose-Capillary: 149 mg/dL — ABNORMAL HIGH (ref 70–99)
Glucose-Capillary: 173 mg/dL — ABNORMAL HIGH (ref 70–99)
Glucose-Capillary: 175 mg/dL — ABNORMAL HIGH (ref 70–99)
Glucose-Capillary: 178 mg/dL — ABNORMAL HIGH (ref 70–99)
Glucose-Capillary: 214 mg/dL — ABNORMAL HIGH (ref 70–99)
Glucose-Capillary: 238 mg/dL — ABNORMAL HIGH (ref 70–99)

## 2024-08-06 MED ORDER — IOHEXOL 300 MG/ML  SOLN
INTRAMUSCULAR | Status: DC | PRN
  Administered 2024-08-06: 20 mL

## 2024-08-06 MED ORDER — ONDANSETRON HCL 4 MG/2ML IJ SOLN
INTRAMUSCULAR | Status: DC | PRN
  Administered 2024-08-06: 4 mg via INTRAVENOUS

## 2024-08-06 MED ORDER — LIDOCAINE 2% (20 MG/ML) 5 ML SYRINGE
INTRAMUSCULAR | Status: DC | PRN
  Administered 2024-08-06: 60 mg via INTRAVENOUS

## 2024-08-06 MED ORDER — HYDRALAZINE HCL 20 MG/ML IJ SOLN
20.0000 mg | Freq: Four times a day (QID) | INTRAMUSCULAR | Status: DC | PRN
  Administered 2024-08-07 – 2024-08-09 (×5): 20 mg via INTRAVENOUS
  Filled 2024-08-06 (×5): qty 1

## 2024-08-06 MED ORDER — TRAVASOL 10 % IV SOLN
INTRAVENOUS | Status: AC
  Filled 2024-08-06: qty 1080

## 2024-08-06 MED ORDER — FLUCONAZOLE IN SODIUM CHLORIDE 400-0.9 MG/200ML-% IV SOLN
400.0000 mg | INTRAVENOUS | Status: DC
  Administered 2024-08-06 – 2024-08-09 (×4): 400 mg via INTRAVENOUS
  Filled 2024-08-06 (×5): qty 200

## 2024-08-06 MED ORDER — CHLORHEXIDINE GLUCONATE 0.12 % MT SOLN
OROMUCOSAL | Status: AC
  Administered 2024-08-06: 15 mL via OROMUCOSAL
  Filled 2024-08-06: qty 15

## 2024-08-06 MED ORDER — MEPERIDINE HCL 25 MG/ML IJ SOLN: 6.2500 mg | INTRAMUSCULAR | Status: DC | PRN

## 2024-08-06 MED ORDER — PROPOFOL 10 MG/ML IV BOLUS
INTRAVENOUS | Status: AC
  Filled 2024-08-06: qty 20

## 2024-08-06 MED ORDER — LEVALBUTEROL HCL 0.63 MG/3ML IN NEBU
0.6300 mg | INHALATION_SOLUTION | Freq: Three times a day (TID) | RESPIRATORY_TRACT | Status: DC
  Administered 2024-08-06 – 2024-08-08 (×8): 0.63 mg via RESPIRATORY_TRACT
  Filled 2024-08-06 (×11): qty 3

## 2024-08-06 MED ORDER — FENTANYL CITRATE (PF) 250 MCG/5ML IJ SOLN
INTRAMUSCULAR | Status: DC | PRN
  Administered 2024-08-06: 50 ug via INTRAVENOUS

## 2024-08-06 MED ORDER — ROCURONIUM BROMIDE 10 MG/ML (PF) SYRINGE
PREFILLED_SYRINGE | INTRAVENOUS | Status: AC
  Filled 2024-08-06: qty 10

## 2024-08-06 MED ORDER — INSULIN ASPART 100 UNIT/ML IJ SOLN
0.0000 [IU] | INTRAMUSCULAR | Status: DC
  Administered 2024-08-06: 3 [IU] via SUBCUTANEOUS
  Administered 2024-08-06 – 2024-08-07 (×2): 5 [IU] via SUBCUTANEOUS
  Filled 2024-08-06 (×2): qty 5
  Filled 2024-08-06: qty 3

## 2024-08-06 MED ORDER — ONDANSETRON HCL 4 MG/2ML IJ SOLN
INTRAMUSCULAR | Status: AC
  Filled 2024-08-06: qty 2

## 2024-08-06 MED ORDER — PHENYLEPHRINE 80 MCG/ML (10ML) SYRINGE FOR IV PUSH (FOR BLOOD PRESSURE SUPPORT)
PREFILLED_SYRINGE | INTRAVENOUS | Status: AC
  Filled 2024-08-06: qty 10

## 2024-08-06 MED ORDER — FENTANYL CITRATE (PF) 100 MCG/2ML IJ SOLN
25.0000 ug | INTRAMUSCULAR | Status: DC | PRN
  Administered 2024-08-06 (×2): 25 ug via INTRAVENOUS

## 2024-08-06 MED ORDER — ACETAMINOPHEN 10 MG/ML IV SOLN
INTRAVENOUS | Status: DC | PRN
  Administered 2024-08-06: 1000 mg via INTRAVENOUS

## 2024-08-06 MED ORDER — PHENYLEPHRINE 80 MCG/ML (10ML) SYRINGE FOR IV PUSH (FOR BLOOD PRESSURE SUPPORT)
PREFILLED_SYRINGE | INTRAVENOUS | Status: DC | PRN
  Administered 2024-08-06: 160 ug via INTRAVENOUS
  Administered 2024-08-06 (×2): 80 ug via INTRAVENOUS
  Administered 2024-08-06: 160 ug via INTRAVENOUS

## 2024-08-06 MED ORDER — SUCCINYLCHOLINE CHLORIDE 200 MG/10ML IV SOSY
PREFILLED_SYRINGE | INTRAVENOUS | Status: AC
  Filled 2024-08-06: qty 10

## 2024-08-06 MED ORDER — DEXAMETHASONE SOD PHOSPHATE PF 10 MG/ML IJ SOLN
INTRAMUSCULAR | Status: AC
  Filled 2024-08-06: qty 1

## 2024-08-06 MED ORDER — OXYCODONE HCL 5 MG/5ML PO SOLN: 5.0000 mg | Freq: Once | ORAL | Status: DC | PRN

## 2024-08-06 MED ORDER — MIDAZOLAM HCL 2 MG/2ML IJ SOLN
INTRAMUSCULAR | Status: AC
  Filled 2024-08-06: qty 2

## 2024-08-06 MED ORDER — SODIUM CHLORIDE 3 % IN NEBU
4.0000 mL | INHALATION_SOLUTION | Freq: Two times a day (BID) | RESPIRATORY_TRACT | Status: AC
  Administered 2024-08-06 – 2024-08-08 (×6): 4 mL via RESPIRATORY_TRACT
  Filled 2024-08-06 (×7): qty 4

## 2024-08-06 MED ORDER — LACTATED RINGERS IV SOLN: INTRAVENOUS | Status: DC

## 2024-08-06 MED ORDER — 0.9 % SODIUM CHLORIDE (POUR BTL) OPTIME
TOPICAL | Status: DC | PRN
  Administered 2024-08-06: 1000 mL

## 2024-08-06 MED ORDER — DEXAMETHASONE SOD PHOSPHATE PF 10 MG/ML IJ SOLN
INTRAMUSCULAR | Status: DC | PRN
  Administered 2024-08-06: 5 mg via INTRAVENOUS

## 2024-08-06 MED ORDER — ORAL CARE MOUTH RINSE: 15.0000 mL | Freq: Once | OROMUCOSAL | Status: AC

## 2024-08-06 MED ORDER — FENTANYL CITRATE (PF) 100 MCG/2ML IJ SOLN
INTRAMUSCULAR | Status: AC
  Filled 2024-08-06: qty 2

## 2024-08-06 MED ORDER — OXYCODONE HCL 5 MG PO TABS: 5.0000 mg | ORAL_TABLET | Freq: Once | ORAL | Status: DC | PRN

## 2024-08-06 MED ORDER — FLUCONAZOLE 40 MG/ML PO SUSR
400.0000 mg | Freq: Every day | ORAL | Status: DC
  Filled 2024-08-06: qty 10

## 2024-08-06 MED ORDER — PROPOFOL 10 MG/ML IV BOLUS
INTRAVENOUS | Status: DC | PRN
  Administered 2024-08-06: 120 mg via INTRAVENOUS

## 2024-08-06 MED ORDER — SUCCINYLCHOLINE CHLORIDE 200 MG/10ML IV SOSY
PREFILLED_SYRINGE | INTRAVENOUS | Status: DC | PRN
  Administered 2024-08-06: 140 mg via INTRAVENOUS

## 2024-08-06 MED ORDER — ROCURONIUM BROMIDE 10 MG/ML (PF) SYRINGE
PREFILLED_SYRINGE | INTRAVENOUS | Status: DC | PRN
  Administered 2024-08-06: 20 mg via INTRAVENOUS

## 2024-08-06 MED ORDER — MIDAZOLAM HCL (PF) 2 MG/2ML IJ SOLN
INTRAMUSCULAR | Status: DC | PRN
  Administered 2024-08-06: 2 mg via INTRAVENOUS

## 2024-08-06 MED ORDER — SUGAMMADEX SODIUM 200 MG/2ML IV SOLN
INTRAVENOUS | Status: DC | PRN
  Administered 2024-08-06 (×2): 100 mg via INTRAVENOUS

## 2024-08-06 MED ORDER — GUAIFENESIN 100 MG/5ML PO LIQD
5.0000 mL | ORAL | Status: DC
  Administered 2024-08-06: 5 mL via ORAL
  Filled 2024-08-06: qty 10

## 2024-08-06 MED ORDER — CHLORHEXIDINE GLUCONATE 0.12 % MT SOLN: 15.0000 mL | Freq: Once | OROMUCOSAL | Status: AC

## 2024-08-06 MED ORDER — LIDOCAINE 2% (20 MG/ML) 5 ML SYRINGE
INTRAMUSCULAR | Status: AC
  Filled 2024-08-06: qty 5

## 2024-08-06 MED ORDER — MIDAZOLAM HCL (PF) 2 MG/2ML IJ SOLN: 0.5000 mg | Freq: Once | INTRAMUSCULAR | Status: DC | PRN

## 2024-08-06 MED ORDER — CLONIDINE HCL 0.3 MG/24HR TD PTWK
0.3000 mg | MEDICATED_PATCH | TRANSDERMAL | Status: AC
  Administered 2024-08-06 – 2024-08-13 (×2): 0.3 mg via TRANSDERMAL
  Filled 2024-08-06 (×4): qty 1

## 2024-08-06 NOTE — Progress Notes (Signed)
 Brief Note  Patient s/p EGD/stent placement on 1/7.  Stent now migrated into stomach.  She remains stable.  Vitals:   08/06/24 1525 08/06/24 1528  BP:    Pulse: 81   Resp: 16   Temp:    SpO2:  94%   Plan: - OR today EGD and stent repositioning.  Con Clunes, MD Cardiothoracic Surgery Pager: [phone]

## 2024-08-06 NOTE — Anesthesia Preprocedure Evaluation (Addendum)
 "                                  Anesthesia Evaluation  Patient identified by MRN, date of birth, ID band Patient awake    Reviewed: Allergy & Precautions, H&P , NPO status , Patient's Chart, lab work & pertinent test results  Airway Mallampati: II   Neck ROM: full    Dental  (+) Dental Advisory Given   Pulmonary sleep apnea , former smoker H/o RML lung cancer: s/p robotic resection   breath sounds clear to auscultation       Cardiovascular hypertension, Pt. on medications and Pt. on home beta blockers + CAD (Mild nonobstructive coronary disease)  + dysrhythmias Supra Ventricular Tachycardia  Rhythm:regular  Echo 03/22/2022: IMPRESSIONS   1. Left ventricular EF60 to 65%. The left ventricle has normal function, no regional  wall motion abnormalities.   2. RVF is normal. The right ventricular size is normal.   3. The mitral valve is normal in structure. Trivial mitral valve regurgitation.   4. The aortic valve is tricuspid. Aortic valve regurgitation is not visualized.     Neuro/Psych  PSYCHIATRIC DISORDERS Anxiety Depression       GI/Hepatic Persistent dysphagia, possible esophageal leak s/p esophageal divertic resection, has esophageal stent   Endo/Other  diabetes, Type 2, Oral Hypoglycemic Agents    Renal/GU      Musculoskeletal   Abdominal   Peds  Hematology Lab Results      Component                Value               Date                      WBC                      17.8 (H)            08/04/2024                HGB                      11.2 (L)            08/04/2024                HCT                      33.5 (L)            08/04/2024                MCV                      94.1                08/04/2024                PLT                      181                 08/04/2024              Anesthesia Other Findings   Reproductive/Obstetrics  Anesthesia Physical Anesthesia Plan  ASA:  3  Anesthesia Plan: General   Post-op Pain Management: Ofirmev  IV (intra-op)*   Induction: Intravenous  PONV Risk Score and Plan: 3 and Ondansetron , Dexamethasone  and Treatment may vary due to age or medical condition  Airway Management Planned: Oral ETT  Additional Equipment: None  Intra-op Plan:   Post-operative Plan: Extubation in OR  Informed Consent: I have reviewed the patients History and Physical, chart, labs and discussed the procedure including the risks, benefits and alternatives for the proposed anesthesia with the patient or authorized representative who has indicated his/her understanding and acceptance.     Dental advisory given  Plan Discussed with: CRNA, Anesthesiologist and Surgeon  Anesthesia Plan Comments:          Anesthesia Quick Evaluation  "

## 2024-08-06 NOTE — Transfer of Care (Signed)
 Immediate Anesthesia Transfer of Care Note  Patient: [name]  Procedure(s) Performed: EGD (ESOPHAGOGASTRODUODENOSCOPY) (Esophagus)  Patient Location: PACU  Anesthesia Type:General  Level of Consciousness: awake, alert , and oriented  Airway & Oxygen Therapy: Patient Spontanous Breathing and Patient connected to nasal cannula oxygen  Post-op Assessment: Report given to RN and Post -op Vital signs reviewed and stable  Post vital signs: Reviewed and stable  Last Vitals:  Vitals Value Taken Time  BP 145/68 08/06/24 17:15  Temp 36.8 C 08/06/24 17:14  Pulse 80 08/06/24 17:17  Resp 16 08/06/24 17:17  SpO2 96 % 08/06/24 17:17  Vitals shown include unfiled device data.  Last Pain:  Vitals:   08/06/24 1715  TempSrc:   PainSc: 0-No pain      Patients Stated Pain Goal: 0 (08/06/24 1230)  Complications: No notable events documented.

## 2024-08-06 NOTE — Progress Notes (Signed)
 PHARMACY - TOTAL PARENTERAL NUTRITION CONSULT NOTE  Indication: Esophageal leak  Patient Measurements: Height: 5' 3 (160 cm) Weight: 71.4 kg (157 lb 6.5 oz) IBW/kg (Calculated) : 52.4 TPN AdjBW (KG): 57.4 Body mass index is 27.88 kg/m.  Assessment:  19 YOF with NSCLC here for RML lobectomy, resection of esophageal diverticulum, and lymph node dissection on 12/29.  Found to have a small contained leak post-op.  Planning to repeat esophagram on 07/30/24.  Patient reports an intentional weight loss of 40 lbs over a couple of years when she was diagnosed with DM.  She typically drinks one Orgain shake every morning for breakfast and eats one meal a day, which could be pizza, spaghetti, chicken, beef or fish along with vegetables and fruits.  Patient was eating normally until her planned surgery.  Discussed with RD/PA regarding early TPN initiation on a floor patient and cost effectiveness of therapy and RD/PA wish to proceed given concern with patient starving herself and malnutrition.  Pharmacy reconsulted for TPN management.  Glucose / Insulin : hx DM, A1c 5.9% - BG <150, used 4 units SSI/24hr, received dexamethasone  1/7  Electrolytes: last labs 1/8 - K 4.2 (Received 30 mEq IV), CoCa 9.3, others wnl  Renal: SCr 0.7, BUN WNL Hepatic: LFTs / tbili WNL, albumin  2.7, TG 106 Intake / Output; MIVF: UOP 1.1 ml/kg/hr, chest tube 70 mL, LBM 1/5  GI Imaging:   12/30 esophagram: Positive for contrast extravasation and an esophageal leak in the mid esophagus 1/2 esophagram: Persistent contrast extravasation and esophageal leak, similar to prior 1/6 esophagram: Limited contrast study due to patient aspiration and severe coughing spell. Study aborted. GI Surgeries / Procedures:  12/30 right middle lobectomy, resection esophageal diverticulum, lymph node biopsy  1/7 Esophagogastroscopy, stent placement   Central access: PICC 07/28/24 TPN start date: 07/28/24  Nutritional Goals:  Goal TPN rate 75  ml/hr to provide 108g AA and 1750 kCal  RD Estimated Needs Total Energy Estimated Needs: 1700-1900 Total Protein Estimated Needs: 100-115g Total Fluid Estimated Needs: >1.7L/day  Current Nutrition:  TPN 12/31 >> 1/8 CLD - passed swallow 1/9 CLD- patient feels can't swallow well, coughs with every drink, had broth and gingerale  Plan:  Continue TPN at 75 ml/hr at 1800 to meet 100% of needs  Adjust electrolytes in TPN: Na 50 mEq/L, K 45 mEq/L, Ca 5 mEq/L, Mg 8 mEq/L, Phos 10 mmol/L, Cl:Ac 1:1   Add standard MVI and trace elements to TPN  Stop mSSI Q6h Standard TPN labs on Mon/Thurs and PRN  F/u po intake and wean TPN as able   Thank you for allowing pharmacy to be a part of this patients care. Jinnie Door, PharmD, BCPS, BCCP Clinical Pharmacist  Please check AMION for all [facility] Pharmacy phone numbers After 10:00 PM, call Main Pharmacy [phone]

## 2024-08-06 NOTE — Anesthesia Procedure Notes (Signed)
 Procedure Name: Intubation Date/Time: 08/06/2024 4:30 PM  Performed by: Maryalice Pasley C., CRNAPre-anesthesia Checklist: Patient identified, Emergency Drugs available, Suction available, Patient being monitored and Timeout performed Patient Re-evaluated:Patient Re-evaluated prior to induction Oxygen Delivery Method: Circle system utilized Preoxygenation: Pre-oxygenation with 100% oxygen Induction Type: IV induction, Rapid sequence and Cricoid Pressure applied Laryngoscope Size: Mac and 3 Grade View: Grade I Tube type: Oral Tube size: 7.5 mm Number of attempts: 1 Airway Equipment and Method: Stylet Placement Confirmation: ETT inserted through vocal cords under direct vision, positive ETCO2 and breath sounds checked- equal and bilateral Secured at: 23 cm Tube secured with: Tape Dental Injury: Teeth and Oropharynx as per pre-operative assessment

## 2024-08-06 NOTE — Progress Notes (Signed)
 Speech Language Pathology Treatment: Dysphagia  Patient Details Name: [name] MRN: [id number] DOB: 05/30/1956 Today's Date: 08/06/2024 Time: 9084-9072 SLP Time Calculation (min) (ACUTE ONLY): 12 min  Assessment / Plan / Recommendation Clinical Impression  Visited pt to reinforce education about MBS result yesterday. Pt reports MD put her on a clear liquid diet and she has not tolerated it well due to coughing. SLP advised pt to reconsider drinking anything given findings clearly stated on MBS and by radiologist of ongoing communication between esophagus and tracheobronchial tree which elicits a strong cough when she drinks something.  Explained problem to pt in accessible terminology and pt verbalized understanding. Assured her that no further SLP intervention was needed, that pt is able to swallow well, that changing the texture or position for swallowing would not prevent her problem. A chin tuck is only beneficial to help the small issue of sometimes losing a drip of liquid down the back of her throat, which is not what is causing her powerful expectoration. Pt was tearful wishing she had more guidance and feeling fearful for future. SLP tried to assist patient in setting up My Chart for greater self advocacy. Also spoke to PA and RN [name] AD for the unit. Provided number to PA to given to Dr. Kerrin if further discussion with SLP is needed. SLP will sign off at this time as no further interventions are needed and concerns about pts care have been communicated to capable staff members.     HPI HPI: Pt presented for surgery r/t diagnosis of non-small cell carcinoma of her right middle lobe. Now s/p right middle lobectomy and resection of esophageal diverticulum on 12/29. Pt has had persistent esopahgeal leak since surgery and three esophagrams. On the last, pt had trace laryngeal aspiration of thin contrast with sensation. Cough resulted in significant expectoration of contrast from  distal airways. Pt had esophageal stent placed on 1/7 and has been cleared by CTS for ingestion of barium and up to a pureed diet with thin liquids. SLP asked to evaluate swallowing for safety with diet initiation.      SLP Plan  Discharge SLP treatment due to (comment)        Swallow Evaluation Recommendations         Recommendations                                 Discharge SLP treatment due to (comment)     Ayub Kirsh, Consuelo Fitch  08/06/2024, 11:17 AM

## 2024-08-06 NOTE — Progress Notes (Signed)
 Mobility Specialist Progress Note:  Pt refused mobility d/t upcoming procedures and being upset about continuous NPO. Will f/u as able.    Thersia Minder Mobility Specialist  Please contact vis Secure Chat or  Rehab Office [phone]

## 2024-08-06 NOTE — Plan of Care (Signed)
" °  Problem: Cardiac: Goal: Will show no evidence of cardiac arrhythmias Outcome: Progressing   Problem: Neurological: Goal: Will regain or maintain usual level of consciousness Outcome: Progressing   Problem: Clinical Measurements: Goal: Ability to maintain clinical measurements within normal limits Outcome: Progressing   Problem: Urinary Elimination: Goal: Will remain free from infection Outcome: Progressing   "

## 2024-08-06 NOTE — Progress Notes (Addendum)
" ° °   °  [address] [place] [employer] [address]             [phone]         2 Days Post-Op Procedures (LRB): EGD (ESOPHAGOGASTRODUODENOSCOPY) With X  WALLFLEX ESOPHAGEAL STENT PLACEMENT (N/A)  Subjective:  Patient did not have a great night.  She states she can't swallow very well.  States everytime she drinks something she has coughing spells.  She states she was able to drink the broth and gingerale this morning  She has moved her bowels.  Denies increase in pain, but states the coughing fits due cause her discomfort.    Objective: Vital signs in last 24 hours: Temp:  [98.1 F (36.7 C)-98.9 F (37.2 C)] 98.2 F (36.8 C) (01/09 0448) Pulse Rate:  [72-82] 82 (01/09 0448) Cardiac Rhythm: Normal sinus rhythm (01/09 0700) Resp:  [15-20] 18 (01/09 0448) BP: (137-169)/(55-78) 166/78 (01/09 0448) SpO2:  [95 %-97 %] 96 % (01/09 0448) Weight:  [71.4 kg] 71.4 kg (01/09 0443)  Intake/Output from previous day: 01/08 0701 - 01/09 0700 In: 1035.1 [I.V.:835.1; IV Piggyback:200] Out: 1920 [Urine:1850; Chest Tube:70]  General appearance: alert, cooperative, and no distress Heart: regular rate and rhythm Lungs: coarse throughout Abdomen: soft, non-tender; bowel sounds normal; no masses,  no organomegaly Extremities: extremities normal, atraumatic, no cyanosis or edema Wound: clean and dry  Lab Results: Recent Labs    08/04/24 0500  WBC 17.8*  HGB 11.2*  HCT 33.5*  PLT 181   BMET:  Recent Labs    08/04/24 0500 08/05/24 0407  NA 141 139  K 3.7 4.2  CL 105 105  CO2 27 26  GLUCOSE 131* 123*  BUN 21 21  CREATININE 0.80 0.76  CALCIUM 8.4* 8.4*    PT/INR: No results for input(s): LABPROT, INR in the last 72 hours. ABG    Component Value Date/Time   TCO2 25 11/10/2009 0952   CBG (last 3)  Recent Labs    08/05/24 1741 08/05/24 2307 08/06/24 0504  GLUCAP 128* 142* 147*    Assessment/Plan: S/P Procedures (LRB): EGD  (ESOPHAGOGASTRODUODENOSCOPY) With X  WALLFLEX ESOPHAGEAL STENT PLACEMENT (N/A)  CV- NSR, H/O HTN- BP remains elevated at times- continue IV Lopressor , Clonidine , Hydralazine  Pulm- CT output remains low.. no increase in output with initiation of oral intake, no air leak.. CXR pending Productive cough.. changed xopenex  to scheduled, add mucinex  scheduled.. may benefit from Pulmonary consult to help with secretion management GI- continue TPN, on CLD.SABRA some difficulty with intake due to choking ID- afebrile, + leukocytosis.. on zosyn .. repeat CBC tomorrow.. if leukocytosis persists Anxiety/Pain - continue prn ativan , pain management Lovenox  for DVT prophylaxis   LOS: 11 days    Rocky Shad, PA-C 08/06/2024 7:27 AM   Contacted via speech pathology concerned that patient is taking anything by mouth is going into her trachea.  She is very concerned the patient is being placed on a clear diet.  I reviewed imaging with Dr. Ferdie who found the esophageal stent to not be in proper position.  Due to this the patient will be made strict NPO.  Her antibiotic coverage was broadened this morning with anti-fungal coverage.  Hallee Mckenny, PA-C 11:12 AM 08/06/2024     "

## 2024-08-06 NOTE — Op Note (Signed)
" ° °   °  [address] [place] [address]             [phone]      08/06/2024  Patient:  [name] Pre-Op Dx: esophageal perforation Malpositioned esophageal stent   Post-op Dx:  same Procedure: - Esophagogastroscopy - repositioning of 125 mm covered esophageal stent placement -  esophagram with Omnipaque    Surgeon and Role:      * Jaquez Farrington, Linnie KIDD, MD - Primary    * B. Su, MD - co-surgeon  Anesthesia  general EBL:  none Blood Administration: none Specimen:  none   Counts: correct   Indications: 69yo female with hx of esophageal stent placement for a perforation.  CXR showed that the stent migrated into the stomach.  Findings: Stent evident in the stomach.    Operative Technique: After the risks, benefits and alternatives were thoroughly discussed, the patient was brought to the operative theatre.  Anesthesia was induced. The patient was prepped and draped in normal sterile fashion.  An appropriate surgical pause was performed, and pre-operative antibiotics were dosed accordingly.  The gastroscope was advanced through the oropharynx into the cervical esophagus under direct visualization.  The scope was passed into the stomach.  The scope was then pulled back, and the esophageal mucosa was visualized.  A mucosal defect was noted proximal to the staple line.  This area was marked with a paperclip using fluoroscopy.  We then went back into the stomach and grasped the stent, and pulled in back into the esophagus.  The body of the stent covered the area marked with the paperclip.  Contrast was injected through the scope.  There was no evidence of extravasation.   The scope was then removed.  The patient tolerated the procedure without any immediate complications, and was transferred to the PACU in stable condition.  [place] O Mattalynn Crandle  "

## 2024-08-06 NOTE — Progress Notes (Signed)
 The proposed treatment discussed in conference is for discussion purpose only and is not a binding recommendation.  The patients have not been physically examined, or presented with their treatment options.  Therefore, final treatment plans cannot be decided.

## 2024-08-07 ENCOUNTER — Inpatient Hospital Stay (HOSPITAL_COMMUNITY)

## 2024-08-07 DIAGNOSIS — Z902 Acquired absence of lung [part of]: Secondary | ICD-10-CM

## 2024-08-07 LAB — CBC
HCT: 32.6 % — ABNORMAL LOW (ref 36.0–46.0)
Hemoglobin: 11 g/dL — ABNORMAL LOW (ref 12.0–15.0)
MCH: 31.4 pg (ref 26.0–34.0)
MCHC: 33.7 g/dL (ref 30.0–36.0)
MCV: 93.1 fL (ref 80.0–100.0)
Platelets: 196 K/uL (ref 150–400)
RBC: 3.5 MIL/uL — ABNORMAL LOW (ref 3.87–5.11)
RDW: 13.8 % (ref 11.5–15.5)
WBC: 15.4 K/uL — ABNORMAL HIGH (ref 4.0–10.5)
nRBC: 0 % (ref 0.0–0.2)

## 2024-08-07 LAB — BASIC METABOLIC PANEL WITH GFR
Anion gap: 8 (ref 5–15)
BUN: 23 mg/dL (ref 8–23)
CO2: 26 mmol/L (ref 22–32)
Calcium: 8.4 mg/dL — ABNORMAL LOW (ref 8.9–10.3)
Chloride: 103 mmol/L (ref 98–111)
Creatinine, Ser: 0.79 mg/dL (ref 0.44–1.00)
GFR, Estimated: >60 mL/min
Glucose, Bld: 210 mg/dL — ABNORMAL HIGH (ref 70–99)
Potassium: 4.2 mmol/L (ref 3.5–5.1)
Sodium: 137 mmol/L (ref 135–145)

## 2024-08-07 LAB — GLUCOSE, CAPILLARY
Glucose-Capillary: 219 mg/dL — ABNORMAL HIGH (ref 70–99)
Glucose-Capillary: 180 mg/dL — ABNORMAL HIGH (ref 70–99)
Glucose-Capillary: 166 mg/dL — ABNORMAL HIGH (ref 70–99)
Glucose-Capillary: 149 mg/dL — ABNORMAL HIGH (ref 70–99)
Glucose-Capillary: 161 mg/dL — ABNORMAL HIGH (ref 70–99)

## 2024-08-07 MED ORDER — TRAVASOL 10 % IV SOLN
INTRAVENOUS | Status: AC
  Filled 2024-08-07: qty 1080

## 2024-08-07 MED ORDER — INSULIN ASPART 100 UNIT/ML IJ SOLN
0.0000 [IU] | INTRAMUSCULAR | Status: DC
  Administered 2024-08-07: 4 [IU] via SUBCUTANEOUS
  Administered 2024-08-07: 3 [IU] via SUBCUTANEOUS
  Administered 2024-08-07 (×2): 4 [IU] via SUBCUTANEOUS
  Administered 2024-08-08: 3 [IU] via SUBCUTANEOUS
  Administered 2024-08-08: 4 [IU] via SUBCUTANEOUS
  Administered 2024-08-08 (×2): 3 [IU] via SUBCUTANEOUS
  Administered 2024-08-08: 4 [IU] via SUBCUTANEOUS
  Administered 2024-08-09: 3 [IU] via SUBCUTANEOUS
  Administered 2024-08-09 (×2): 4 [IU] via SUBCUTANEOUS
  Administered 2024-08-09: 3 [IU] via SUBCUTANEOUS
  Administered 2024-08-09: 4 [IU] via SUBCUTANEOUS
  Administered 2024-08-09 – 2024-08-10 (×2): 3 [IU] via SUBCUTANEOUS
  Administered 2024-08-10: 7 [IU] via SUBCUTANEOUS
  Administered 2024-08-10: 4 [IU] via SUBCUTANEOUS
  Filled 2024-08-07: qty 4
  Filled 2024-08-07: qty 3
  Filled 2024-08-07: qty 4
  Filled 2024-08-07: qty 3
  Filled 2024-08-07: qty 4
  Filled 2024-08-07 (×2): qty 3
  Filled 2024-08-07: qty 4
  Filled 2024-08-07: qty 3
  Filled 2024-08-07: qty 4
  Filled 2024-08-07: qty 3
  Filled 2024-08-07 (×2): qty 4
  Filled 2024-08-07: qty 3
  Filled 2024-08-07: qty 7
  Filled 2024-08-07: qty 3
  Filled 2024-08-07: qty 4
  Filled 2024-08-07: qty 3

## 2024-08-07 MED ORDER — KETOROLAC TROMETHAMINE 15 MG/ML IJ SOLN
15.0000 mg | Freq: Four times a day (QID) | INTRAMUSCULAR | Status: AC | PRN
  Administered 2024-08-07 – 2024-08-10 (×6): 15 mg via INTRAVENOUS
  Filled 2024-08-07 (×6): qty 1

## 2024-08-07 MED ORDER — ORAL CARE MOUTH RINSE
15.0000 mL | OROMUCOSAL | Status: DC
  Administered 2024-08-08: 15 mL via OROMUCOSAL

## 2024-08-07 MED ORDER — ORAL CARE MOUTH RINSE: 15.0000 mL | OROMUCOSAL | Status: DC | PRN

## 2024-08-07 MED ORDER — KETOROLAC TROMETHAMINE 15 MG/ML IJ SOLN
15.0000 mg | Freq: Once | INTRAMUSCULAR | Status: AC
  Administered 2024-08-07: 15 mg via INTRAVENOUS
  Filled 2024-08-07: qty 1

## 2024-08-07 MED ORDER — ACETAMINOPHEN 10 MG/ML IV SOLN: 1000.0000 mg | Freq: Four times a day (QID) | INTRAVENOUS | Status: AC | PRN

## 2024-08-07 NOTE — Progress Notes (Addendum)
" ° °   °  [address] [place] [address]             [phone]         1 Day Post-Op Procedures (LRB): EGD (ESOPHAGOGASTRODUODENOSCOPY) (N/A)  Subjective:  Contacted overnight in regards to pain in chest.  This morning patient is doing better.  Continues to have similar discomfort, states medication she received overnight really helped.  She is not coughing near as much.  Objective: Vital signs in last 24 hours: Temp:  [98.1 F (36.7 C)-99 F (37.2 C)] 98.5 F (36.9 C) (01/10 0749) Pulse Rate:  [70-86] 72 (01/10 0749) Cardiac Rhythm: Normal sinus rhythm (01/10 0759) Resp:  [15-20] 16 (01/10 0749) BP: (142-190)/(58-87) 153/58 (01/10 0749) SpO2:  [92 %-97 %] 94 % (01/10 0749) Weight:  [73.6 kg] 73.6 kg (01/10 0416)  Intake/Output from previous day: 01/09 0701 - 01/10 0700 In: 100 [I.V.:100] Out: 1043 [Urine:1000; Chest Tube:43]  General appearance: alert, cooperative, and no distress Heart: regular rate and rhythm Lungs: clear to auscultation bilaterally Abdomen: soft, non-tender; bowel sounds normal; no masses,  no organomegaly Extremities: extremities normal, atraumatic, no cyanosis or edema Wound: clean and dry  Lab Results: Recent Labs    08/07/24 0200  WBC 15.4*  HGB 11.0*  HCT 32.6*  PLT 196   BMET:  Recent Labs    08/05/24 0407 08/07/24 0200  NA 139 137  K 4.2 4.2  CL 105 103  CO2 26 26  GLUCOSE 123* 210*  BUN 21 23  CREATININE 0.76 0.79  CALCIUM 8.4* 8.4*    PT/INR: No results for input(s): LABPROT, INR in the last 72 hours. ABG    Component Value Date/Time   TCO2 25 11/10/2009 0952   CBG (last 3)  Recent Labs    08/06/24 2337 08/07/24 0428 08/07/24 0751  GLUCAP 238* 219* 180*    Assessment/Plan: S/P Procedures (LRB): EGD (ESOPHAGOGASTRODUODENOSCOPY) (N/A)  CV- NSR, Hypertension persists- continue IV Lopressor  Q6, Clonidine  0.3 mg patch daily, Hydralazine  20 mg IV prn Pulm- CT on waterseal w/o air  leak.. CXR with stable apical space.. no signficant effusion present.... output from chest tube is low GI-continue NPO, on TPN for nutritional coverage.. Esophageal stent adjusted yesterday, appears in place on CXR.SABRA likely have DG esophogram on Monday Renal- creatinine, electrolytes are normal Pain control- persistent issues, patient on fentanyl  patch, prn dilaudid .. no one has administered tylenol  suppository will adjust to IV as she can not have oral intake.. will give Toradol  15 mg Q 6 prn for 48 hours as she had good relief overnight Anxiety- continue Ativan  prn ID- leukocytosis improving, remains afebrile continue Zosyn , Diflucan  Ambulate, Lovenox  for DVT prophylaxis   LOS: 12 days    Rocky Shad, PA-C 08/07/2024 9:13 AM  Continue NPO for now Swallow on Monday, and then PO challenge  Suanne Minahan O Blakelyn Dinges   "

## 2024-08-07 NOTE — Progress Notes (Signed)
 Pt was complaining of 10/10 CP, described as trapped air that she was unable to expel. PRN Dilaudid  administered previously with no relief. 12-lead EKG obtained, notified Barrett, PA.  Verbal order received for one-time dose of Toradol , contacted radiology to obtain morning CXR ASAP.  Pt now resting comfortably with alleviation of symptoms.   Lonell LITTIE Lyme, RN

## 2024-08-07 NOTE — Progress Notes (Signed)
 PHARMACY - TOTAL PARENTERAL NUTRITION CONSULT NOTE  Indication: Esophageal leak  Patient Measurements: Height: 5' 3 (160 cm) Weight: 73.6 kg (162 lb 4.1 oz) IBW/kg (Calculated) : 52.4 TPN AdjBW (KG): 57.4 Body mass index is 28.74 kg/m.  Assessment:  82 YOF with NSCLC here for RML lobectomy, resection of esophageal diverticulum, and lymph node dissection on 12/29.  Found to have a small contained leak post-op.  Planning to repeat esophagram on 07/30/24.  Patient reports an intentional weight loss of 40 lbs over a couple of years when she was diagnosed with DM.  She typically drinks one Orgain shake every morning for breakfast and eats one meal a day, which could be pizza, spaghetti, chicken, beef or fish along with vegetables and fruits.  Patient was eating normally until her planned surgery.  Discussed with RD/PA regarding early TPN initiation on a floor patient and cost effectiveness of therapy and RD/PA wish to proceed given concern with patient starving herself and malnutrition.  Pharmacy reconsulted for TPN management.  Glucose / Insulin : hx DM, A1c 5.9% - BG <~200s, used 13 units SSI/24hr, received dexamethasone  1/7  Electrolytes: last labs 1/8 - K 4.2, CoCa 9.3, others wnl  Renal: SCr 0.79, BUN WNL Hepatic: LFTs / tbili WNL, albumin  2.7, TG 106 Intake / Output; MIVF: UOP 0.6 ml/kg/hr, chest tube 43 mL, LBM 1/9  GI Imaging:   12/30 esophagram: Positive for contrast extravasation and an esophageal leak in the mid esophagus 1/2 esophagram: Persistent contrast extravasation and esophageal leak, similar to prior 1/6 esophagram: Limited contrast study due to patient aspiration and severe coughing spell. Study aborted. GI Surgeries / Procedures:  12/30 right middle lobectomy, resection esophageal diverticulum, lymph node biopsy  1/7 Esophagogastroscopy, stent placement   Central access: PICC 07/28/24 TPN start date: 07/28/24  Nutritional Goals:  Goal TPN rate 75 ml/hr to provide  108g AA and 1750 kCal  RD Estimated Needs Total Energy Estimated Needs: 1700-1900 Total Protein Estimated Needs: 100-115g Total Fluid Estimated Needs: >1.7L/day  Current Nutrition:  TPN 12/31 >> 1/8 CLD - passed swallow 1/9 CLD- patient feels can't swallow well, coughs with every drink, had broth and gingerale  Plan:  Continue TPN at 75 ml/hr at 1800 to meet 100% of needs  Adjust electrolytes in TPN: Na 50 mEq/L, K 45 mEq/L, Ca 5 mEq/L, Mg 8 mEq/L, Phos 10 mmol/L, Cl:Ac 1:1   Add standard MVI and trace elements to TPN  Increase to resistant SSI q 4h hours  Standard TPN labs on Mon/Thurs and PRN  F/u po intake and wean TPN as able   Sharyne Glatter, PharmD, BCCCP Critical Care Clinical Pharmacist 08/07/2024 7:01 AM

## 2024-08-07 NOTE — Plan of Care (Signed)
" °  Problem: Education: Goal: Knowledge of the prescribed therapeutic regimen will improve Outcome: Progressing   Problem: Bowel/Gastric: Goal: Gastrointestinal status for postoperative course will improve Outcome: Progressing   Problem: Neurological: Goal: Will regain or maintain usual level of consciousness Outcome: Progressing   Problem: Skin Integrity: Goal: Demonstrates signs of wound healing without infection Outcome: Progressing   Problem: Urinary Elimination: Goal: Will remain free from infection Outcome: Progressing   Problem: Education: Goal: Knowledge of General Education information will improve Description: Including pain rating scale, medication(s)/side effects and non-pharmacologic comfort measures Outcome: Progressing   "

## 2024-08-07 NOTE — Anesthesia Postprocedure Evaluation (Signed)
"   Anesthesia Post Note  Patient: [name]  Procedure(s) Performed: EGD (ESOPHAGOGASTRODUODENOSCOPY) (Esophagus)     Patient location during evaluation: PACU Anesthesia Type: General Level of consciousness: awake and alert Pain management: pain level controlled Vital Signs Assessment: post-procedure vital signs reviewed and stable Respiratory status: spontaneous breathing, nonlabored ventilation and respiratory function stable Cardiovascular status: blood pressure returned to baseline and stable Postop Assessment: no apparent nausea or vomiting Anesthetic complications: no   No notable events documented.                Geanette Buonocore      "

## 2024-08-08 ENCOUNTER — Inpatient Hospital Stay (HOSPITAL_COMMUNITY)

## 2024-08-08 ENCOUNTER — Encounter (HOSPITAL_COMMUNITY): Payer: Self-pay

## 2024-08-08 LAB — RENAL FUNCTION PANEL
Albumin: 3.2 g/dL — ABNORMAL LOW (ref 3.5–5.0)
Anion gap: 8 (ref 5–15)
BUN: 28 mg/dL — ABNORMAL HIGH (ref 8–23)
CO2: 25 mmol/L (ref 22–32)
Calcium: 8.7 mg/dL — ABNORMAL LOW (ref 8.9–10.3)
Chloride: 105 mmol/L (ref 98–111)
Creatinine, Ser: 0.85 mg/dL (ref 0.44–1.00)
GFR, Estimated: >60 mL/min
Glucose, Bld: 101 mg/dL — ABNORMAL HIGH (ref 70–99)
Phosphorus: 2.7 mg/dL (ref 2.5–4.6)
Potassium: 4.3 mmol/L (ref 3.5–5.1)
Sodium: 138 mmol/L (ref 135–145)

## 2024-08-08 LAB — GLUCOSE, CAPILLARY
Glucose-Capillary: 119 mg/dL — ABNORMAL HIGH (ref 70–99)
Glucose-Capillary: 124 mg/dL — ABNORMAL HIGH (ref 70–99)
Glucose-Capillary: 143 mg/dL — ABNORMAL HIGH (ref 70–99)
Glucose-Capillary: 146 mg/dL — ABNORMAL HIGH (ref 70–99)
Glucose-Capillary: 160 mg/dL — ABNORMAL HIGH (ref 70–99)
Glucose-Capillary: 162 mg/dL — ABNORMAL HIGH (ref 70–99)
Glucose-Capillary: 173 mg/dL — ABNORMAL HIGH (ref 70–99)

## 2024-08-08 MED ORDER — ORAL CARE MOUTH RINSE
15.0000 mL | OROMUCOSAL | Status: DC
  Administered 2024-08-08 (×3): 15 mL via OROMUCOSAL

## 2024-08-08 MED ORDER — TRAVASOL 10 % IV SOLN
INTRAVENOUS | Status: AC
  Filled 2024-08-08: qty 1080

## 2024-08-08 MED ORDER — ORAL CARE MOUTH RINSE: 15.0000 mL | OROMUCOSAL | Status: DC | PRN

## 2024-08-08 NOTE — Progress Notes (Signed)
 PHARMACY - TOTAL PARENTERAL NUTRITION CONSULT NOTE  Indication: Esophageal leak  Patient Measurements: Height: 5' 3 (160 cm) Weight: 71.6 kg (157 lb 13.6 oz) IBW/kg (Calculated) : 52.4 TPN AdjBW (KG): 57.4 Body mass index is 27.96 kg/m.  Assessment:  48 YOF with NSCLC here for RML lobectomy, resection of esophageal diverticulum, and lymph node dissection on 12/29.  Found to have a small contained leak post-op.  Planning to repeat esophagram on 07/30/24.  Patient reports an intentional weight loss of 40 lbs over a couple of years when she was diagnosed with DM.  She typically drinks one Orgain shake every morning for breakfast and eats one meal a day, which could be pizza, spaghetti, chicken, beef or fish along with vegetables and fruits.  Patient was eating normally until her planned surgery.  Discussed with RD/PA regarding early TPN initiation on a floor patient and cost effectiveness of therapy and RD/PA wish to proceed given concern with patient starving herself and malnutrition.  Pharmacy reconsulted for TPN management.  Glucose / Insulin : hx DM, A1c 5.9% - BG <~200s, used 22 units SSI/24hr, received dexamethasone  1/7 , 1/9  Electrolytes: last labs 1/11 - K 4.3, CoCa 9.3, others wnl  Renal: SCr 0.85, BUN 28 Hepatic: LFTs / tbili WNL, albumin  2.7, TG 106 Intake / Output; MIVF: UOP none charted, LBM 1/9  GI Imaging:   12/30 esophagram: Positive for contrast extravasation and an esophageal leak in the mid esophagus 1/2 esophagram: Persistent contrast extravasation and esophageal leak, similar to prior 1/6 esophagram: Limited contrast study due to patient aspiration and severe coughing spell. Study aborted. GI Surgeries / Procedures:  12/30 right middle lobectomy, resection esophageal diverticulum, lymph node biopsy  1/7 Esophagogastroscopy, stent placement   Central access: PICC 07/28/24 TPN start date: 07/28/24  Nutritional Goals:  Goal TPN rate 75 ml/hr to provide 108g AA and  1750 kCal  RD Estimated Needs Total Energy Estimated Needs: 1700-1900 Total Protein Estimated Needs: 100-115g Total Fluid Estimated Needs: >1.7L/day  Current Nutrition:  TPN 12/31 >> 1/8 CLD - passed swallow 1/9 CLD- patient feels can't swallow well, coughs with every drink, had broth and gingerale  Plan:  Continue TPN at 75 ml/hr at 1800 to meet 100% of needs  Continue electrolytes in TPN: Na 50 mEq/L, K 45 mEq/L, Ca 5 mEq/L, Mg 8 mEq/L, Phos 10 mmol/L, Cl:Ac 1:1   Add standard MVI and trace elements to TPN  Continue resistant SSI q 4h hours  Standard TPN labs on Mon/Thurs and PRN  F/u po intake and wean TPN as able   Sharyne Glatter, PharmD, BCCCP Critical Care Clinical Pharmacist 08/08/2024 7:02 AM

## 2024-08-08 NOTE — Progress Notes (Signed)
" ° °   °  [address] [place] [address]             [phone]         2 Days Post-Op Procedures (LRB): EGD (ESOPHAGOGASTRODUODENOSCOPY) (N/A)  Subjective:  No events  Objective: Vital signs in last 24 hours: Temp:  [98.2 F (36.8 C)-98.8 F (37.1 C)] 98.2 F (36.8 C) (01/11 0809) Pulse Rate:  [68-81] 77 (01/11 0809) Cardiac Rhythm: Normal sinus rhythm (01/11 0700) Resp:  [18-20] 18 (01/11 0809) BP: (162-189)/(60-77) 171/68 (01/11 0809) SpO2:  [95 %-96 %] 96 % (01/11 0809) Weight:  [71.6 kg-73.9 kg] 71.6 kg (01/11 0230)  Intake/Output from previous day: 01/10 0701 - 01/11 0700 In: -  Out: 255 [Urine:250; Chest Tube:5]  General appearance: alert, cooperative, and no distress Heart: regular rate and rhythm Lungs: clear to auscultation bilaterally Abdomen: soft, non-tender; bowel sounds normal; no masses,  no organomegaly Extremities: extremities normal, atraumatic, no cyanosis or edema Wound: clean and dry  Lab Results: Recent Labs    08/07/24 0200  WBC 15.4*  HGB 11.0*  HCT 32.6*  PLT 196   BMET:  Recent Labs    08/07/24 0200 08/08/24 0743  NA 137 138  K 4.2 4.3  CL 103 105  CO2 26 25  GLUCOSE 210* 101*  BUN 23 28*  CREATININE 0.79 0.85  CALCIUM 8.4* 8.7*    PT/INR: No results for input(s): LABPROT, INR in the last 72 hours. ABG    Component Value Date/Time   TCO2 25 11/10/2009 0952   CBG (last 3)  Recent Labs    08/08/24 0157 08/08/24 0408 08/08/24 0814  GLUCAP 173* 146* 124*    Assessment/Plan: S/P Procedures (LRB): EGD (ESOPHAGOGASTRODUODENOSCOPY) (N/A)  CXR stable.  Stent in good position.  Evidence of esophageal spasm Low CT output Esophagram tomorrow    LOS: 13 days    Linnie MALVA Rayas, MD 08/08/2024 9:56 AM     "

## 2024-08-08 NOTE — Plan of Care (Signed)
" °  Problem: Bowel/Gastric: Goal: Gastrointestinal status for postoperative course will improve Outcome: Progressing   Problem: Cardiac: Goal: Ability to maintain an adequate cardiac output Outcome: Progressing   Problem: Cardiac: Goal: Will show no evidence of cardiac arrhythmias Outcome: Progressing   "

## 2024-08-08 NOTE — Plan of Care (Signed)
  Problem: Activity: Goal: Risk for activity intolerance will decrease Outcome: Progressing   Problem: Coping: Goal: Level of anxiety will decrease Outcome: Progressing   Problem: Pain Managment: Goal: General experience of comfort will improve and/or be controlled Outcome: Progressing   Problem: Safety: Goal: Ability to remain free from injury will improve Outcome: Progressing

## 2024-08-08 NOTE — Plan of Care (Signed)
" °  Problem: Clinical Measurements: Goal: Ability to maintain clinical measurements within normal limits Outcome: Progressing Goal: Postoperative complications will be avoided or minimized Outcome: Progressing   Problem: Skin Integrity: Goal: Demonstrates signs of wound healing without infection Outcome: Progressing   Problem: Activity: Goal: Risk for activity intolerance will decrease Outcome: Progressing   Problem: Coping: Goal: Level of anxiety will decrease Outcome: Progressing   Problem: Pain Management: Goal: Pain level will decrease Outcome: Progressing   "

## 2024-08-08 NOTE — Progress Notes (Signed)
 Patient's blood pressure has been running high throughout the shift, 170-180s/60-70s. At 2125 blood pressure was 175/65, hydralazine  prn given. At 0159, 179/71 and too early for another dose of hydralazine . At 0412 blood pressure was 189/65, hydralazine  prn given.

## 2024-08-09 ENCOUNTER — Inpatient Hospital Stay (HOSPITAL_COMMUNITY)

## 2024-08-09 LAB — COMPREHENSIVE METABOLIC PANEL WITH GFR
ALT: 20 U/L (ref 0–44)
AST: 14 U/L — ABNORMAL LOW (ref 15–41)
Albumin: 2.9 g/dL — ABNORMAL LOW (ref 3.5–5.0)
Alkaline Phosphatase: 55 U/L (ref 38–126)
Anion gap: 8 (ref 5–15)
BUN: 26 mg/dL — ABNORMAL HIGH (ref 8–23)
CO2: 25 mmol/L (ref 22–32)
Calcium: 8.4 mg/dL — ABNORMAL LOW (ref 8.9–10.3)
Chloride: 104 mmol/L (ref 98–111)
Creatinine, Ser: 0.78 mg/dL (ref 0.44–1.00)
GFR, Estimated: >60 mL/min
Glucose, Bld: 132 mg/dL — ABNORMAL HIGH (ref 70–99)
Potassium: 4.1 mmol/L (ref 3.5–5.1)
Sodium: 137 mmol/L (ref 135–145)
Total Bilirubin: 0.5 mg/dL (ref 0.0–1.2)
Total Protein: 5.6 g/dL — ABNORMAL LOW (ref 6.5–8.1)

## 2024-08-09 LAB — GLUCOSE, CAPILLARY
Glucose-Capillary: 130 mg/dL — ABNORMAL HIGH (ref 70–99)
Glucose-Capillary: 152 mg/dL — ABNORMAL HIGH (ref 70–99)
Glucose-Capillary: 180 mg/dL — ABNORMAL HIGH (ref 70–99)
Glucose-Capillary: 128 mg/dL — ABNORMAL HIGH (ref 70–99)
Glucose-Capillary: 140 mg/dL — ABNORMAL HIGH (ref 70–99)
Glucose-Capillary: 157 mg/dL — ABNORMAL HIGH (ref 70–99)

## 2024-08-09 LAB — MAGNESIUM: Magnesium: 2 mg/dL (ref 1.7–2.4)

## 2024-08-09 LAB — PHOSPHORUS: Phosphorus: 3.1 mg/dL (ref 2.5–4.6)

## 2024-08-09 LAB — TRIGLYCERIDES: Triglycerides: 172 mg/dL — ABNORMAL HIGH

## 2024-08-09 MED ORDER — BOOST / RESOURCE BREEZE PO LIQD CUSTOM
1.0000 | Freq: Three times a day (TID) | ORAL | Status: DC
  Administered 2024-08-10: 1 via ORAL

## 2024-08-09 MED ORDER — METOPROLOL TARTRATE 25 MG/10 ML ORAL SUSPENSION
100.0000 mg | Freq: Two times a day (BID) | ORAL | Status: DC
  Administered 2024-08-09 – 2024-08-10 (×4): 100 mg via ORAL
  Filled 2024-08-09 (×5): qty 40

## 2024-08-09 MED ORDER — LEVALBUTEROL HCL 0.63 MG/3ML IN NEBU
0.6300 mg | INHALATION_SOLUTION | Freq: Four times a day (QID) | RESPIRATORY_TRACT | Status: DC
  Filled 2024-08-09: qty 3

## 2024-08-09 MED ORDER — IOHEXOL 300 MG/ML  SOLN
100.0000 mL | Freq: Once | INTRAMUSCULAR | Status: AC | PRN
  Administered 2024-08-09: 100 mL via ORAL

## 2024-08-09 MED ORDER — LEVALBUTEROL HCL 0.63 MG/3ML IN NEBU
0.6300 mg | INHALATION_SOLUTION | Freq: Three times a day (TID) | RESPIRATORY_TRACT | Status: DC
  Administered 2024-08-09 – 2024-08-14 (×12): 0.63 mg via RESPIRATORY_TRACT
  Filled 2024-08-09 (×15): qty 3

## 2024-08-09 MED ORDER — NITROGLYCERIN 0.1 MG/HR TD PT24
0.1000 mg | MEDICATED_PATCH | Freq: Every day | TRANSDERMAL | Status: DC
  Administered 2024-08-09: 0.1 mg via TRANSDERMAL
  Filled 2024-08-09 (×2): qty 1

## 2024-08-09 MED ORDER — OXYCODONE HCL 5 MG/5ML PO SOLN
10.0000 mg | ORAL | Status: DC | PRN
  Administered 2024-08-09 – 2024-08-10 (×9): 10 mg via ORAL
  Filled 2024-08-09 (×9): qty 10

## 2024-08-09 MED ORDER — TRAVASOL 10 % IV SOLN
INTRAVENOUS | Status: AC
  Filled 2024-08-09: qty 1080

## 2024-08-09 MED ORDER — HYDRALAZINE HCL 20 MG/ML IJ SOLN
20.0000 mg | Freq: Four times a day (QID) | INTRAMUSCULAR | Status: DC | PRN
  Administered 2024-08-09 – 2024-08-15 (×6): 20 mg via INTRAVENOUS
  Filled 2024-08-09 (×8): qty 1

## 2024-08-09 MED ORDER — LEVALBUTEROL HCL 0.63 MG/3ML IN NEBU
0.6300 mg | INHALATION_SOLUTION | Freq: Three times a day (TID) | RESPIRATORY_TRACT | Status: AC | PRN
  Administered 2024-08-09 – 2024-08-16 (×2): 0.63 mg via RESPIRATORY_TRACT
  Filled 2024-08-09 (×3): qty 3

## 2024-08-09 MED ORDER — HYDRALAZINE HCL 20 MG/ML IJ SOLN: 20.0000 mg | Freq: Three times a day (TID) | INTRAMUSCULAR | Status: DC

## 2024-08-09 NOTE — Progress Notes (Signed)
 Mobility Specialist Progress Note;    08/09/24 1037  Mobility  Activity Ambulated with assistance  Level of Assistance Standby assist, set-up cues, supervision of patient - no hands on  Assistive Device Other (Comment) (IV Pole)  Distance Ambulated (ft) 500 ft  Activity Response Tolerated well  Mobility Referral Yes  Mobility visit 1 Mobility  Mobility Specialist Start Time (ACUTE ONLY) 1037  Mobility Specialist Stop Time (ACUTE ONLY) 1049  Mobility Specialist Time Calculation (min) (ACUTE ONLY) 12 min   Pt received in bed, agreeable to mobility. No physical assistance required during ambulation, SV for safety. VSS on RA. No c/o during ambulation, however once pt seated stated she was breathless. SPO2 97% on RA when checked. Pt left in bed, call bell and personal belongings in reach. All needs met. Respiratory in room at EOS.   Lauraine Erm Mobility Specialist Please contact via SecureChat or [employer] [phone]

## 2024-08-09 NOTE — Plan of Care (Signed)
" °  Problem: Activity: Goal: Risk for activity intolerance will decrease Outcome: Progressing   Problem: Coping: Goal: Level of anxiety will decrease Outcome: Progressing   Problem: Elimination: Goal: Will not experience complications related to bowel motility Outcome: Progressing Goal: Will not experience complications related to urinary retention Outcome: Progressing   Problem: Pain Managment: Goal: General experience of comfort will improve and/or be controlled Outcome: Progressing   Problem: Safety: Goal: Ability to remain free from injury will improve Outcome: Progressing   Problem: Activity: Goal: Risk for activity intolerance will decrease Outcome: Progressing   Problem: Pain Management: Goal: Pain level will decrease Outcome: Progressing   "

## 2024-08-09 NOTE — Progress Notes (Signed)
 PHARMACY - TOTAL PARENTERAL NUTRITION CONSULT NOTE  Indication: Esophageal leak  Patient Measurements: Height: 5' 3 (160 cm) Weight: 71.1 kg (156 lb 12 oz) IBW/kg (Calculated) : 52.4 TPN AdjBW (KG): 57.4 Body mass index is 27.77 kg/m.  Assessment:  35 YOF with NSCLC here for RML lobectomy, resection of esophageal diverticulum, and lymph node dissection on 12/29.  Found to have a small contained leak post-op.  Planning to repeat esophagram on 07/30/24.  Patient reports an intentional weight loss of 40 lbs over a couple of years when she was diagnosed with DM.  She typically drinks one Orgain shake every morning for breakfast and eats one meal a day, which could be pizza, spaghetti, chicken, beef or fish along with vegetables and fruits.  Patient was eating normally until her planned surgery.  Discussed with RD/PA regarding early TPN initiation on a floor patient and cost effectiveness of therapy and RD/PA wish to proceed given concern with patient starving herself and malnutrition.  Pharmacy reconsulted for TPN management.  Glucose / Insulin : hx DM, A1c 5.9% - BG 110-160s, used 17 units SSI/24hr - received dexamethasone  1/7 , 1/9  Electrolytes: K 4.1, CoCa 9.3, others wnl  Renal: SCr 0.85, BUN 26 Hepatic: LFTs / tbili WNL, albumin  2.9, TG 172 Intake / Output; MIVF: UOP none charted, LBM 1/11, chest tube 28 mL  GI Imaging:   12/30 esophagram: Positive for contrast extravasation and an esophageal leak in the mid esophagus 1/2 esophagram: Persistent contrast extravasation and esophageal leak, similar to prior 1/6 esophagram: Limited contrast study due to patient aspiration and severe coughing spell. Study aborted. GI Surgeries / Procedures:  12/30 right middle lobectomy, resection esophageal diverticulum, lymph node biopsy  1/7 Esophagogastroscopy, stent placement   Central access: PICC 07/28/24 TPN start date: 07/28/24  Nutritional Goals:  Goal TPN rate 75 ml/hr to provide 108g AA  and 1750 kCal  RD Estimated Needs Total Energy Estimated Needs: 1700-1900 Total Protein Estimated Needs: 100-115g Total Fluid Estimated Needs: >1.7L/day  Current Nutrition:  TPN 12/31 >> 1/08 CLD - passed swallow 1/09 CLD - patient feels can't swallow well > NPO 1/12 CLD resuming post esophagram   Plan: Continue TPN at 75 ml/hr at 1800 to meet 100% of needs  Continue electrolytes in TPN: Na 50 mEq/L, K 45 mEq/L, Ca 5 mEq/L, Mg 8 mEq/L, Phos 10 mmol/L, Cl:Ac 1:1   Add standard MVI and trace elements to TPN  Continue resistant SSI q 4h hours  Standard TPN labs on Mon/Thurs and PRN  F/u po intake and wean TPN as able   Thank you for allowing pharmacy to be a part of this patients care.  Shelba Collier, PharmD, BCPS Clinical Pharmacist

## 2024-08-09 NOTE — Progress Notes (Addendum)
" ° °   °  [address] [place] [address]             [phone]         3 Days Post-Op Procedures (LRB): EGD (ESOPHAGOGASTRODUODENOSCOPY) (N/A)  Subjective:  Patient looks great.  Had a better night last night.  She is no longer coughing.    Objective: Vital signs in last 24 hours: Temp:  [98.1 F (36.7 C)-98.5 F (36.9 C)] 98.2 F (36.8 C) (01/12 0315) Pulse Rate:  [71-87] 80 (01/12 0633) Cardiac Rhythm: Normal sinus rhythm (01/11 1904) Resp:  [16-19] 19 (01/12 0315) BP: (166-192)/(57-80) 186/60 (01/12 0633) SpO2:  [95 %-97 %] 95 % (01/12 9366) Weight:  [71.1 kg] 71.1 kg (01/12 0315)  Intake/Output from previous day: 01/11 0701 - 01/12 0700 In: 3304.3 [I.V.:2207; IV Piggyback:1097.3] Out: 28 [Chest Tube:28]  General appearance: alert, cooperative, and no distress Heart: regular rate and rhythm Lungs: clear to auscultation bilaterally Abdomen: soft, non-tender; bowel sounds normal; no masses,  no organomegaly Extremities: extremities normal, atraumatic, no cyanosis or edema Wound: clean and dry  Lab Results: Recent Labs    08/07/24 0200  WBC 15.4*  HGB 11.0*  HCT 32.6*  PLT 196   BMET:  Recent Labs    08/08/24 0743 08/09/24 0350  NA 138 137  K 4.3 4.1  CL 105 104  CO2 25 25  GLUCOSE 101* 132*  BUN 28* 26*  CREATININE 0.85 0.78  CALCIUM 8.7* 8.4*    PT/INR: No results for input(s): LABPROT, INR in the last 72 hours. ABG    Component Value Date/Time   TCO2 25 11/10/2009 0952   CBG (last 3)  Recent Labs    08/08/24 1954 08/08/24 2355 08/09/24 0316  GLUCAP 119* 160* 130*    Assessment/Plan: S/P Procedures (LRB): EGD (ESOPHAGOGASTRODUODENOSCOPY) (N/A)  CV- NSR, + HTN- remains elevated despite Clonidine  patch at 0.3 mg, IV Lopressor  15 mg Q6, will add NTG patch as discussed with Dr. Kerrin, continue prn Hydralazine  Pulm- CT output remains low, no air leak.. CXR with stable space.. can hopefully remove  soon GI- NPO, on TPN-- for DG esophogram this morning.. hopefully can start clears today if stent is in proper position w/o evidence of leak Pain control- continue current regimen, will be able to transition to liquids once able to tolerate oral intake Anxiety-continue prn ID- continue Zosyn , Diflucan - remains afebrile, no leukocytosis Dispo- patient doing well, for esophogram today   LOS: 14 days    Rocky Shad, PA-C 08/09/2024 7:45 AM Patient seen and examined, agree with above For swallow this AM  Elspeth C. Kerrin, MD [facility] [phone]    "

## 2024-08-09 NOTE — Progress Notes (Signed)
 Nutrition Follow-up  DOCUMENTATION CODES:   Severe malnutrition in context of acute illness/injury  INTERVENTION:  Continue TPN, per pharmacy -MVI w/ minerals added Add Boost Breeze po TID, each supplement provides 250 kcal and 9 grams of protein  Monitor intake and ability to wean nutrition support When diet advanced, add Ensure Plus High Protein and Magic Cup to augment intake Patient may bring in Orgain protein powder to be mixed with milk to mimic her home regimen when diet advanced to full liquids   NUTRITION DIAGNOSIS:  Severe Malnutrition related to acute illness as evidenced by moderate muscle depletion, energy intake < or equal to 50% for > or equal to 5 days. - remains applicable  GOAL:  Patient will meet greater than or equal to 90% of their needs - being met via TPN; progressing  MONITOR:  Diet advancement, Supplement acceptance, PO intake, Weight trends  REASON FOR ASSESSMENT:  Consult New TPN/TNA  ASSESSMENT:   Pt with PMH significant for: T2DM, HLD, HTN, sleep apnea, anxiety and depression. 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 Now s/p right middle lobectomy and resection of esophageal diverticulum.  12/29 admitted; OR: middle lobectomy and resection of esophageal diverticulum 12/30 esophagram: small contained esophageal leak 12/31 PICC placed; TPN started 1/06 aspirated contrast - esophagram unable to be completed 1/07 esophageal stent placed; TPN at goal 1/08 diet adv CLD  1/09 reports of excessive coughing; esophageal stent found to be in wrong position 1/10 adjustment to esophageal stent 1/12 repeat esophagram; diet advanced to clear liquid diet  Patient with with significant improvement in symptoms. CT with low output. Hopeful to remove soon.   Diet now advanced to clear liquids s/p esophagram this morning. Monitoring tolerance. Boost Breeze added to augment intake.  Continue to monitor for diet  advancement and ability to wean nutrition support.   Admit Weight: 75.2 kg (first measured) Current Weight: 71.1 kg   She endorses UBW around 150lbs. Still slightly above this, but trending down toward reported UBW. Bowels are now moving. No significant edema noted.  Pharmacy Plan: Continue TPN at 75 ml/hr at 1800 to meet 100% of needs  Continue electrolytes in TPN: Na 50 mEq/L, K 45 mEq/L, Ca 5 mEq/L, Mg 8 mEq/L, Phos 10 mmol/L, Cl:Ac 1:1   Add standard MVI and trace elements to TPN  Continue resistant SSI q 4h hours  Standard TPN labs on Mon/Thurs and PRN  F/u po intake and wean TPN as able    Drains/Lines: R basilic: PICC, double lumen (placed 12/31) R pleural: chest tube (28Fr): 28 ml x24 hours   Electrolytes stable. No longer refeeding.   Meds: SS Novolog    Labs:  Na+ 132>137 (wdl) K+ 3.5 (wdl) PHOS 1.7 (L) WBC 14.0 (H) CBGs 119-126 x24 hours A1c 5.9 (06/2024)   Diet Order:   Diet Order             Diet clear liquid Room service appropriate? Yes; Fluid consistency: Thin  Diet effective now                   EDUCATION NEEDS:   Education needs have been addressed  Skin:  Skin Assessment: Reviewed RN Assessment  Last BM:  1/12 - type 6 x1  Height:   Ht Readings from Last 1 Encounters:  08/04/24 5' 3 (1.6 m)    Weight:   Wt Readings from Last 1 Encounters:  08/09/24 71.1 kg    Ideal Body Weight:  52.3 kg  BMI:  Body mass index is 27.77 kg/m.  Estimated Nutritional Needs:   Kcal:  1700-1900  Protein:  100-115g  Fluid:  >1.7L/day  Kelly Deaner MS, RD, LDN Registered Dietitian I Clinical Nutrition RD Inpatient Contact Info in Amion

## 2024-08-10 ENCOUNTER — Inpatient Hospital Stay (HOSPITAL_COMMUNITY)

## 2024-08-10 ENCOUNTER — Ambulatory Visit: Admitting: Thoracic Surgery (Cardiothoracic Vascular Surgery)

## 2024-08-10 LAB — GLUCOSE, CAPILLARY
Glucose-Capillary: 100 mg/dL — ABNORMAL HIGH (ref 70–99)
Glucose-Capillary: 150 mg/dL — ABNORMAL HIGH (ref 70–99)
Glucose-Capillary: 152 mg/dL — ABNORMAL HIGH (ref 70–99)
Glucose-Capillary: 160 mg/dL — ABNORMAL HIGH (ref 70–99)
Glucose-Capillary: 182 mg/dL — ABNORMAL HIGH (ref 70–99)
Glucose-Capillary: 208 mg/dL — ABNORMAL HIGH (ref 70–99)

## 2024-08-10 MED ORDER — TRAVASOL 10 % IV SOLN: INTRAVENOUS | Status: DC

## 2024-08-10 MED ORDER — NITROGLYCERIN 0.2 MG/HR TD PT24
0.2000 mg | MEDICATED_PATCH | Freq: Every day | TRANSDERMAL | Status: DC
  Filled 2024-08-10: qty 1

## 2024-08-10 MED ORDER — FLUCONAZOLE 40 MG/ML PO SUSR
400.0000 mg | Freq: Every day | ORAL | Status: DC
  Administered 2024-08-10: 400 mg via ORAL
  Filled 2024-08-10 (×2): qty 10

## 2024-08-10 MED ORDER — ADULT MULTIVITAMIN W/MINERALS CH: 1.0000 | ORAL_TABLET | Freq: Every day | ORAL | Status: DC

## 2024-08-10 MED ORDER — DILTIAZEM 12 MG/ML ORAL SUSPENSION
120.0000 mg | Freq: Two times a day (BID) | ORAL | Status: DC
  Administered 2024-08-10 (×2): 120 mg via ORAL
  Filled 2024-08-10 (×3): qty 10

## 2024-08-10 MED ORDER — ENSURE PLUS HIGH PROTEIN PO LIQD: 237.0000 mL | Freq: Three times a day (TID) | ORAL | Status: DC

## 2024-08-10 MED ORDER — AMOXICILLIN-POT CLAVULANATE 600-42.9 MG/5ML PO SUSR
600.0000 mg | Freq: Two times a day (BID) | ORAL | Status: DC
  Administered 2024-08-10 (×2): 600 mg via ORAL
  Filled 2024-08-10 (×3): qty 5

## 2024-08-10 MED ORDER — INSULIN ASPART 100 UNIT/ML IJ SOLN
0.0000 [IU] | Freq: Four times a day (QID) | INTRAMUSCULAR | Status: AC
  Administered 2024-08-10 (×2): 3 [IU] via SUBCUTANEOUS
  Administered 2024-08-11 – 2024-08-15 (×4): 2 [IU] via SUBCUTANEOUS
  Administered 2024-08-15: 3 [IU] via SUBCUTANEOUS
  Administered 2024-08-15: 5 [IU] via SUBCUTANEOUS
  Administered 2024-08-15: 3 [IU] via SUBCUTANEOUS
  Administered 2024-08-16: 2 [IU] via SUBCUTANEOUS
  Administered 2024-08-16: 3 [IU] via SUBCUTANEOUS
  Administered 2024-08-16: 2 [IU] via SUBCUTANEOUS
  Administered 2024-08-17: 5 [IU] via SUBCUTANEOUS
  Administered 2024-08-17: 8 [IU] via SUBCUTANEOUS
  Administered 2024-08-17: 3 [IU] via SUBCUTANEOUS
  Administered 2024-08-17 (×2): 8 [IU] via SUBCUTANEOUS
  Administered 2024-08-18: 3 [IU] via SUBCUTANEOUS
  Filled 2024-08-10: qty 8
  Filled 2024-08-10: qty 3
  Filled 2024-08-10: qty 5
  Filled 2024-08-10 (×2): qty 2
  Filled 2024-08-10: qty 5
  Filled 2024-08-10 (×2): qty 2
  Filled 2024-08-10 (×2): qty 8
  Filled 2024-08-10: qty 2
  Filled 2024-08-10 (×4): qty 3
  Filled 2024-08-10 (×2): qty 2
  Filled 2024-08-10: qty 3

## 2024-08-10 MED ORDER — DIPHENHYDRAMINE HCL 50 MG/ML IJ SOLN: 25.0000 mg | Freq: Four times a day (QID) | INTRAMUSCULAR | Status: AC | PRN

## 2024-08-10 MED ORDER — GLUCERNA SHAKE PO LIQD: 237.0000 mL | Freq: Two times a day (BID) | ORAL | Status: DC

## 2024-08-10 NOTE — Progress Notes (Addendum)
 Nutrition Brief Note  Patient diet advanced to full liquid diet this morning and documented with 100% of her full liquid meal consumed. Advanced to DYS1/thin liquids this afternoon. Plan is to d/c TPN when current bag is complete, per cardiothoracic surgery.  Met with patient at beside who reports some pain around chest tube site, but eased with pain medications. She reports not preferring clear liquid diet, but did eat all of her full liquid diet meal and tolerated. RN notes, no pills for now. Will hold off, but recommend addition of MVI when cleared for pills as she was receiving in TPN.   Prefers chocolate ONS. Ensure Plus High Protein out of stock. Will order Glucerna, which she is amicable accepting.  Discussed importance of meeting calorie and protein needs s/p discontinuation of TPN and using ONS to augment intake as diet progresses. She verbalized understanding.   INTERVENTION:  Discontinue TPN, per surgery WHEN APPROPRIATE: Add MVI w/ minerals Discontinue Boost Breeze po TID, each supplement provides 250 kcal and 9 grams of protein  Add Glucerna Shake po TID, each supplement provides 220 kcal and 10 grams of protein  Add Magic cup TID with meals, each supplement provides 290 kcal and 9 grams of protein    NUTRITION DIAGNOSIS:  Severe Malnutrition related to acute illness as evidenced by moderate muscle depletion, energy intake < or equal to 50% for > or equal to 5 days. - remains applicable   GOAL:  Patient will meet greater than or equal to 90% of their needs - progressing   MONITOR:  Diet advancement, Supplement acceptance, PO intake, Weight trends  Blair Deaner MS, RD, LDN Registered Dietitian I Clinical Nutrition RD Inpatient Contact Info in Amion

## 2024-08-10 NOTE — Progress Notes (Addendum)
" ° °   °  [address] [place] [address]             [phone]         4 Days Post-Op Procedures (LRB): EGD (ESOPHAGOGASTRODUODENOSCOPY) (N/A)  Subjective:  Patient having pain today.  She tolerated liquid intake yesterday, however states she does not have many options on clears as she does not like many of the options.  Objective: Vital signs in last 24 hours: Temp:  [98.2 F (36.8 C)-98.7 F (37.1 C)] 98.2 F (36.8 C) (01/13 0430) Pulse Rate:  [75-93] 89 (01/13 0430) Cardiac Rhythm: Normal sinus rhythm (01/12 1915) Resp:  [15-20] 20 (01/13 0430) BP: (161-189)/(56-85) 182/70 (01/13 0430) SpO2:  [96 %-99 %] 98 % (01/13 0430) Weight:  [70 kg] 70 kg (01/13 0430)  Intake/Output from previous day: 01/12 0701 - 01/13 0700 In: 2811.4 [P.O.:360; I.V.:2091.1; IV Piggyback:360.3] Out: 32 [Chest Tube:32]  General appearance: alert, cooperative, and no distress Heart: regular rate and rhythm Lungs: clear to auscultation bilaterally Abdomen: soft, non-tender; bowel sounds normal; no masses,  no organomegaly Wound: clean and dry  Lab Results: No results for input(s): WBC, HGB, HCT, PLT in the last 72 hours. BMET:  Recent Labs    08/08/24 0743 08/09/24 0350  NA 138 137  K 4.3 4.1  CL 105 104  CO2 25 25  GLUCOSE 101* 132*  BUN 28* 26*  CREATININE 0.85 0.78  CALCIUM 8.7* 8.4*    PT/INR: No results for input(s): LABPROT, INR in the last 72 hours. ABG    Component Value Date/Time   TCO2 25 11/10/2009 0952   CBG (last 3)  Recent Labs    08/09/24 1949 08/10/24 0011 08/10/24 0426  GLUCAP 157* 152* 150*    Assessment/Plan: S/P Procedures (LRB): EGD (ESOPHAGOGASTRODUODENOSCOPY) (N/A)  CV- NSR, + HTN- continue Lopressor  100 mg BID, Clonidine  patch 0.3 mg daily, will start Cardizem  120 mg BID.SABRA this can also help with esophageal spasm Pulm- CT output remains low, CXR is improved with atelectasis.. There is a stable right perihilar  opacity, not requiring oxygen.. can hopefully d/c chest tube soon GI- will advance diet to full liquid, will stop TPN after current bag is complete.. Esophageal stent appears spasmed, but remains in place Anxiety- stable Pain control- pain has been well controlled, need to work on weaning pain medication to achieve home regimen prior to discharge ID- will stop IV medication, transition to oral Augmentin , Diflucan  for 14 days   LOS: 15 days    Rocky Shad, PA-C 08/10/2024 7:46 AM Patient seen and examined, d/w Mrs. Barrett Plan as outlined above  Elspeth C. Kerrin, MD [facility] [phone]     "

## 2024-08-10 NOTE — Progress Notes (Addendum)
 PHARMACY - TOTAL PARENTERAL NUTRITION CONSULT NOTE  Indication: Esophageal leak  Patient Measurements: Height: 5' 3 (160 cm) Weight: 70 kg (154 lb 5.2 oz) IBW/kg (Calculated) : 52.4 TPN AdjBW (KG): 57.4 Body mass index is 27.34 kg/m.  Assessment:  23 YOF with NSCLC here for RML lobectomy, resection of esophageal diverticulum, and lymph node dissection on 12/29.  Found to have a small contained leak post-op.  Planning to repeat esophagram on 07/30/24.  Patient reports an intentional weight loss of 40 lbs over a couple of years when she was diagnosed with DM.  She typically drinks one Orgain shake every morning for breakfast and eats one meal a day, which could be pizza, spaghetti, chicken, beef or fish along with vegetables and fruits.  Patient was eating normally until her planned surgery.  Discussed with RD/PA regarding early TPN initiation on a floor patient and cost effectiveness of therapy and RD/PA wish to proceed given concern with patient starving herself and malnutrition.  Pharmacy reconsulted for TPN management.  Glucose / Insulin : hx DM, A1c 5.9% - BG 120-150s, used 21 units SSI/24hr - received dexamethasone  1/07, 1/09  Electrolytes: K 4.1, CoCa 9.3, others wnl  Renal: SCr 0.85, BUN 26 Hepatic: LFTs / tbili WNL, albumin  2.9, TG 172 Intake / Output; MIVF: UOP none charted, LBM 1/12, chest tube 32 mL  GI Imaging:   12/30 esophagram: Positive for contrast extravasation and an esophageal leak in the mid esophagus 1/2 esophagram: Persistent contrast extravasation and esophageal leak, similar to prior 1/6 esophagram: Limited contrast study due to patient aspiration and severe coughing spell. Study aborted. GI Surgeries / Procedures:  12/30 right middle lobectomy, resection esophageal diverticulum, lymph node biopsy  1/7 Esophagogastroscopy, stent placement   Central access: PICC 07/28/24 TPN start date: 07/28/24  Nutritional Goals:  Goal TPN rate 75 ml/hr to provide 108g AA  and 1750 kCal  RD Estimated Needs Total Energy Estimated Needs: 1700-1900 Total Protein Estimated Needs: 100-115g Total Fluid Estimated Needs: >1.7L/day  Current Nutrition:  TPN 12/31 >> 1/08 CLD - passed swallow 1/09 CLD - patient feels can't swallow well > NPO 1/12 CLD resuming post esophagram  1/13 advanced to full liquid diet  Plan: Stop TPN after current bag completes  Adjust to moderate SSI q6h hours  Standard TPN labs on Mon/Thurs and PRN   Thank you for allowing pharmacy to be a part of this patients care.  Shelba Collier, PharmD, BCPS Clinical Pharmacist

## 2024-08-11 ENCOUNTER — Inpatient Hospital Stay (HOSPITAL_COMMUNITY)

## 2024-08-11 LAB — CBC
HCT: 31.9 % — ABNORMAL LOW (ref 36.0–46.0)
Hemoglobin: 10.8 g/dL — ABNORMAL LOW (ref 12.0–15.0)
MCH: 31.5 pg (ref 26.0–34.0)
MCHC: 33.9 g/dL (ref 30.0–36.0)
MCV: 93 fL (ref 80.0–100.0)
Platelets: 199 K/uL (ref 150–400)
RBC: 3.43 MIL/uL — ABNORMAL LOW (ref 3.87–5.11)
RDW: 14.2 % (ref 11.5–15.5)
WBC: 13.8 K/uL — ABNORMAL HIGH (ref 4.0–10.5)
nRBC: 0 % (ref 0.0–0.2)

## 2024-08-11 LAB — BASIC METABOLIC PANEL WITH GFR
Anion gap: 9 (ref 5–15)
BUN: 22 mg/dL (ref 8–23)
CO2: 24 mmol/L (ref 22–32)
Calcium: 8.6 mg/dL — ABNORMAL LOW (ref 8.9–10.3)
Chloride: 98 mmol/L (ref 98–111)
Creatinine, Ser: 0.96 mg/dL (ref 0.44–1.00)
GFR, Estimated: >60 mL/min
Glucose, Bld: 178 mg/dL — ABNORMAL HIGH (ref 70–99)
Potassium: 4.4 mmol/L (ref 3.5–5.1)
Sodium: 131 mmol/L — ABNORMAL LOW (ref 135–145)

## 2024-08-11 LAB — GLUCOSE, CAPILLARY
Glucose-Capillary: 106 mg/dL — ABNORMAL HIGH (ref 70–99)
Glucose-Capillary: 110 mg/dL — ABNORMAL HIGH (ref 70–99)
Glucose-Capillary: 112 mg/dL — ABNORMAL HIGH (ref 70–99)
Glucose-Capillary: 126 mg/dL — ABNORMAL HIGH (ref 70–99)

## 2024-08-11 MED ORDER — FLUCONAZOLE IN SODIUM CHLORIDE 400-0.9 MG/200ML-% IV SOLN
400.0000 mg | Freq: Every day | INTRAVENOUS | Status: DC
  Administered 2024-08-11 – 2024-08-21 (×11): 400 mg via INTRAVENOUS
  Filled 2024-08-11 (×13): qty 200

## 2024-08-11 MED ORDER — NITROGLYCERIN 0.2 MG/HR TD PT24
0.2000 mg | MEDICATED_PATCH | Freq: Every day | TRANSDERMAL | Status: DC
  Administered 2024-08-12 – 2024-08-14 (×3): 0.2 mg via TRANSDERMAL
  Filled 2024-08-11 (×4): qty 1

## 2024-08-11 MED ORDER — METOPROLOL TARTRATE 5 MG/5ML IV SOLN
15.0000 mg | Freq: Four times a day (QID) | INTRAVENOUS | Status: DC
  Administered 2024-08-11 – 2024-08-14 (×12): 15 mg via INTRAVENOUS
  Filled 2024-08-11 (×12): qty 15

## 2024-08-11 MED ORDER — ACETAMINOPHEN 10 MG/ML IV SOLN
1000.0000 mg | Freq: Four times a day (QID) | INTRAVENOUS | Status: AC | PRN
  Administered 2024-08-11: 1000 mg via INTRAVENOUS
  Filled 2024-08-11: qty 100

## 2024-08-11 MED ORDER — PIPERACILLIN-TAZOBACTAM 3.375 G IVPB
3.3750 g | Freq: Three times a day (TID) | INTRAVENOUS | Status: DC
  Administered 2024-08-11 – 2024-08-20 (×28): 3.375 g via INTRAVENOUS
  Filled 2024-08-11 (×28): qty 50

## 2024-08-11 NOTE — TOC Initial Note (Signed)
 Transition of Care [facility]) - Initial/Assessment Note    Patient Details  Name: [name] MRN: [id number] Date of Birth: 06-Jan-1956  [facility]) CM/SW Contact:    Roxie KANDICE Stain, RN Phone Number: 08/11/2024, 4:15 PM  Clinical Narrative:                 Spoke to patient at bedside regarding transition needs. Patient states she lives at home with spouse and has no DME. Patient is able to drive herself but if needed her spouse can assist. PCP confirmed.  ICM (Inpatient Care Management) will continue to follow, no needs anticipated at this time.   Expected Discharge Plan: Home/Self Care Barriers to Discharge: Continued Medical Work up   Patient Goals and CMS Choice Patient states their goals for this hospitalization and ongoing recovery are:: get better          Expected Discharge Plan and Services       Living arrangements for the past 2 months: Single Family Home                                      Prior Living Arrangements/Services Living arrangements for the past 2 months: Single Family Home Lives with:: Spouse Patient language and need for interpreter reviewed:: Yes Do you feel safe going back to the place where you live?: Yes      Need for Family Participation in Patient Care: Yes (Comment) Care giver support system in place?: Yes (comment)   Criminal Activity/Legal Involvement Pertinent to Current Situation/Hospitalization: No - Comment as needed  Activities of Daily Living   ADL Screening (condition at time of admission) Independently performs ADLs?: Yes (appropriate for developmental age) Is the patient deaf or have difficulty hearing?: No Does the patient have difficulty seeing, even when wearing glasses/contacts?: No Does the patient have difficulty concentrating, remembering, or making decisions?: No  Permission Sought/Granted                  Emotional Assessment Appearance:: Appears stated  age Attitude/Demeanor/Rapport: Engaged Affect (typically observed): Accepting Orientation: : Oriented to Self, Oriented to Place, Oriented to  Time, Oriented to Situation Alcohol / Substance Use: Not Applicable Psych Involvement: No (comment)  Admission diagnosis:  Primary non-small cell carcinoma of middle lobe of right lung (HCC) [C34.2] Lung cancer (HCC) [C34.90] S/P lobectomy of lung [Z90.2] Patient Active Problem List   Diagnosis Date Noted   Protein-calorie malnutrition, severe 07/28/2024   Lung cancer (HCC) 07/26/2024   S/P lobectomy of lung 07/26/2024   Malignant neoplasm of middle lobe, bronchus or lung (HCC) 06/16/2024   Solitary pulmonary nodule 05/31/2024   Shingles rash 07/08/2023   Abdominal pain 07/07/2023   Diverticular disease of colon 06/28/2023   OSA (obstructive sleep apnea) 07/10/2022   HYPOTENSION, ORTHOSTATIC 11/21/2009   SYNCOPE 11/21/2009   CHEST PAIN 11/21/2009   MIXED HYPERLIPIDEMIA 07/18/2008   Essential hypertension, benign 07/18/2008   PSVT 07/18/2008   PALPITATIONS 07/18/2008   PCP:  Okey Carlin Redbird, MD Pharmacy:   [facility] [id number] - [address] [place] - [address] [place] [address] Phone: [phone] Fax: [phone]     Social Drivers of Health (SDOH) Social History: SDOH Screenings   Food Insecurity: No Food Insecurity (07/26/2024)  Housing: Low Risk (07/26/2024)  Transportation Needs: No Transportation Needs (07/26/2024)  Utilities: Not At Risk (07/26/2024)  Depression (PHQ2-9): Low  Risk (06/16/2024)  Social Connections: Unknown (07/26/2024)  Tobacco Use: Medium Risk (08/06/2024)   SDOH Interventions:     Readmission Risk Interventions     No data to display

## 2024-08-11 NOTE — Progress Notes (Signed)
 Nutrition Brief Note  Patient noted with coughing overnight. Imaging showed esophageal stent has, yet again, migrated into the stomach. Would recommend re-initiation of TPN while patient NPO. Per chart review, CTS to discuss this with attending. Will continue to monitor clinical course and modify plan of care as indicated. Chest tube remains in place with low output.  INTERVENTION:  Recommend re-initiation of TPN: -Kcal: 1700-1900 kcals -Protein: 100-115g   -Add MVI w/ minerals  Monitor diet advancement/intake and ability to wean nutrition support When diet advanced, add Glucerna shake (chocolate) and Magic Cup to augment intake Patient may bring in Orgain protein powder to be mixed with milk to mimic her home regimen when diet advanced to full liquids     NUTRITION DIAGNOSIS:  Severe Malnutrition related to acute illness as evidenced by moderate muscle depletion, energy intake < or equal to 50% for > or equal to 5 days. - remains applicable   GOAL:  Patient will meet greater than or equal to 90% of their needs - being met via TPN; progressing   MONITOR:  Diet advancement, Supplement acceptance, PO intake, Weight trends   Blair Deaner MS, RD, LDN Registered Dietitian I Clinical Nutrition RD Inpatient Contact Info in Amion

## 2024-08-11 NOTE — Consult Note (Signed)
 Reason for Consult: Esophageal perforation status post esophageal diverticulectomy. Referring Physician: CT surgery  [name] is an 69 y.o. female.  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  She had esophagogram done the day after surgery and was noted to have an esophageal leak at the mid esophagus chest x-ray showed no evidence of clear pneumothorax and there was no air leak she was started on TPN on 07/28/2024 and a PICC line was placed repeat esophagogram was done on 08/03/24 which showed persistent esophageal leak and she was kept n.p.o. with full TPN support. She also aspirated during this study and therefore the study was aborted. She had an esophageal stent placed on 08/05/2023 and follow-up films revealed esophageal stent to be in place. A modified barium swallow done on 08/05/2024 revealed aspiration and the patient had coughing after the study was completed.  Abnormal communication was noted within the esophagus and the tracheobronchial tree.  A repeat barium swallow done on 08/09/2024 revealed patent esophageal stent extending from the mid esophagus to the GE junction with mild narrowing of the esophagus proximal to the stent the contrast seem to move through the stent and into the stomach with prominent and spontaneous reflux that backfills the length of the esophageal stent with no evidence of contrast extravasation however the esophagus did not empty prior to the termination of the exam.  Films done today show distal migration of the esophageal stent appearing within the stomach entirely along with postoperative changes from the right middle lobectomy associated right lung volume  loss and a small right pleural effusion.  GI consultation has been requested for further management of esophageal perforation and possible removal of the esophageal stent from the stomach and repeat placement of the stent. On reviewing the endoscopy reports the stent measures about 125 mm in size. On interviewing the patient she complains of a persistent cough and pooling of secretions in her mouth for which she has a suction Yonker in her hand.   Past Medical History:  Diagnosis Date   Anxiety    Depression    Diabetes mellitus without complication (HCC)    Dysrhythmia    PVCs, SVT   Heart murmur    1981; when pt was pregnant   Hyperlipidemia    Hypertension    Sleep apnea    Tachycardia    Tuberculosis 1980   was treated; latent   Past Surgical History:  Procedure Laterality Date   APPENDECTOMY     CESAREAN SECTION     ENDOBRONCHIAL ULTRASOUND Bilateral 06/03/2024   Procedure: ENDOBRONCHIAL ULTRASOUND (EBUS);  Surgeon: Catherine Cools, MD;  Location: [facility] ENDOSCOPY;  Service: Pulmonary;  Laterality: Bilateral;   ESOPHAGOGASTRODUODENOSCOPY N/A 08/04/2024   Procedure: EGD (ESOPHAGOGASTRODUODENOSCOPY) With X  WALLFLEX ESOPHAGEAL STENT PLACEMENT;  Surgeon: Shyrl Linnie KIDD, MD;  Location: [facility] OR;  Service: Thoracic;  Laterality: N/A;   ESOPHAGOGASTRODUODENOSCOPY N/A 08/06/2024   Procedure: EGD (ESOPHAGOGASTRODUODENOSCOPY);  Surgeon: Daniel Con RAMAN, MD;  Location: [facility] OR;  Service: Thoracic;  Laterality: N/A;   LOBECTOMY, LUNG, ROBOT-ASSISTED, USING VATS Right 07/26/2024   Procedure: ROBOT ASSISTED RIGHT MIDDLE LOBECTOMY; RESECTION OF ESOPHAGEAL DIVERTICULUM;  Surgeon: Kerrin Elspeth BROCKS, MD;  Location: [facility] OR;  Service: Thoracic;  Laterality:  Right;  ROBOTIC RIGHT MIDDLE LOBECTOMY   LYMPH NODE BIOPSY  07/26/2024   Procedure: LYMPH NODE BIOPSY;  Surgeon: Kerrin Elspeth BROCKS, MD;  Location[facility] OR;  Service: Thoracic;;   TONSILLECTOMY  1974   VIDEO BRONCHOSCOPY WITH ENDOBRONCHIAL  NAVIGATION Right 06/03/2024   Procedure: VIDEO BRONCHOSCOPY WITH ENDOBRONCHIAL NAVIGATION;  Surgeon: Catherine Cools, MD;  Location: [facility] ENDOSCOPY;  Service: Pulmonary;  Laterality: Right;   Family History  Problem Relation Age of Onset   Heart attack Mother    Coronary artery disease Father        bypass surgery   Heart block Brother    Breast cancer Paternal Aunt        @ unknown age   BRCA 1/2 Neg Hx    Social History:  reports that she quit smoking about 2 months ago. Her smoking use included cigarettes. She started smoking about 4 years ago. She has a 1.9 pack-year smoking history. She has never used smokeless tobacco. She reports that she does not currently use alcohol. No history on file for drug use.  Allergies: Allergies[1]  Medications: I have reviewed the patient's current medications. Prior to Admission:  Medications Prior to Admission  Medication Sig Dispense Refill Last Dose/Taking   acetaminophen  (TYLENOL ) 500 MG tablet Take 1,000 mg by mouth every 6 (six) hours as needed for mild pain (pain score 1-3) or moderate pain (pain score 4-6).   07/25/2024   ALPRAZolam  (XANAX ) 1 MG tablet Take 1 mg by mouth 3 (three) times daily as needed for sleep or anxiety.   07/26/2024 at  7:00 AM   aspirin  EC 81 MG tablet Take 81 mg by mouth daily. Swallow whole.   Taking   calcium carbonate (TUMS - DOSED IN MG ELEMENTAL CALCIUM) 500 MG chewable tablet Chew 1 tablet by mouth daily as needed for indigestion or heartburn.   07/25/2024   HYDROcodone-acetaminophen  (NORCO) 10-325 MG tablet Take 1 tablet by mouth every 6 (six) hours as needed for moderate pain (pain score 4-6) or severe pain (pain score 7-10).   07/25/2024   ibuprofen (ADVIL) 200 MG tablet Take 400-800 mg by mouth every 6 (six) hours as needed for mild pain (pain score 1-3) or moderate pain (pain score 4-6).   Past Month   irbesartan  (AVAPRO ) 150 MG tablet Take 150 mg by mouth daily.   07/25/2024   JARDIANCE 25 MG TABS tablet Take  25 mg by mouth every morning.   07/22/2024   levalbuterol  (XOPENEX  HFA) 45 MCG/ACT inhaler Inhale 1 puff into the lungs every 8 (eight) hours as needed for wheezing or shortness of breath.   Taking As Needed   metaxalone (SKELAXIN) 800 MG tablet Take 800 mg by mouth 3 (three) times daily as needed for muscle spasms.   Past Week   metoprolol  succinate (TOPROL -XL) 50 MG 24 hr tablet 1 TABLET ORALLY ONCE A DAY 90 DAYS 90 tablet 3 07/26/2024 at  7:30 AM   nicotine  (NICODERM CQ  - DOSED IN MG/24 HOURS) 21 mg/24hr patch Place 10.5 mg onto the skin daily. Cuts the patch   07/25/2024   pravastatin  (PRAVACHOL ) 40 MG tablet Take 1 tablet by mouth daily.   07/26/2024 at  7:30 AM   propranolol (INDERAL) 10 MG tablet Take 10-20 mg by mouth daily as needed (tachacardia).   07/26/2024 at  7:30 AM   tiZANidine (ZANAFLEX) 2 MG tablet Take 2 mg by mouth every 8 (eight) hours as needed for muscle spasms.   07/26/2024 at  7:30 AM   traZODone  (DESYREL ) 100 MG tablet Take 200 mg by mouth at bedtime.   Taking   Scheduled:  Chlorhexidine  Gluconate Cloth  6 each Topical Daily   cloNIDine   0.3 mg Transdermal Weekly   enoxaparin  (LOVENOX ) injection  40 mg Subcutaneous Daily   fentaNYL   1 patch Transdermal Q72H   insulin  aspart  0-15 Units Subcutaneous Q6H   levalbuterol   0.63 mg Nebulization Q8H   metoprolol  tartrate  15 mg Intravenous Q6H   neomycin -bacitracin -polymyxin   Topical BID   nicotine   7 mg Transdermal Daily   nitroGLYCERIN   0.2 mg Transdermal Daily   pantoprazole  (PROTONIX ) IV  40 mg Intravenous Q24H   sodium chloride  flush  10-40 mL Intracatheter Q12H   Continuous:  acetaminophen      fluconazole  (DIFLUCAN ) IV Stopped (08/11/24 1056)   piperacillin -tazobactam (ZOSYN )  IV 3.375 g (08/11/24 1321)   promethazine  (PHENERGAN ) injection (IM or IVPB) Stopped (08/07/24 1907)   PRN:acetaminophen , bisacodyl , diphenhydrAMINE , hydrALAZINE , HYDROmorphone  (DILAUDID ) injection, ketorolac , levalbuterol ,  lidocaine -prilocaine , lip balm, LORazepam , ondansetron  (ZOFRAN ) IV, mouth rinse, promethazine  (PHENERGAN ) injection (IM or IVPB), sodium chloride  flush, sodium phosphate   Results for orders placed or performed during the hospital encounter of 07/26/24 (from the past 48 hours)  Glucose, capillary     Status: Abnormal   Collection Time: 08/09/24  4:09 PM  Result Value Ref Range   Glucose-Capillary 140 (H) 70 - 99 mg/dL    Comment: Glucose reference range applies only to samples taken after fasting for at least 8 hours.  Glucose, capillary     Status: Abnormal   Collection Time: 08/09/24  7:49 PM  Result Value Ref Range   Glucose-Capillary 157 (H) 70 - 99 mg/dL    Comment: Glucose reference range applies only to samples taken after fasting for at least 8 hours.  Glucose, capillary     Status: Abnormal   Collection Time: 08/10/24 12:11 AM  Result Value Ref Range   Glucose-Capillary 152 (H) 70 - 99 mg/dL    Comment: Glucose reference range applies only to samples taken after fasting for at least 8 hours.  Glucose, capillary     Status: Abnormal   Collection Time: 08/10/24  4:26 AM  Result Value Ref Range   Glucose-Capillary 150 (H) 70 - 99 mg/dL    Comment: Glucose reference range applies only to samples taken after fasting for at least 8 hours.  Glucose, capillary     Status: Abnormal   Collection Time: 08/10/24  8:05 AM  Result Value Ref Range   Glucose-Capillary 208 (H) 70 - 99 mg/dL    Comment: Glucose reference range applies only to samples taken after fasting for at least 8 hours.  Glucose, capillary     Status: Abnormal   Collection Time: 08/10/24 11:22 AM  Result Value Ref Range   Glucose-Capillary 100 (H) 70 - 99 mg/dL    Comment: Glucose reference range applies only to samples taken after fasting for at least 8 hours.  Glucose, capillary     Status: Abnormal   Collection Time: 08/10/24  5:29 PM  Result Value Ref Range   Glucose-Capillary 160 (H) 70 - 99 mg/dL    Comment:  Glucose reference range applies only to samples taken after fasting for at least 8 hours.  Glucose, capillary     Status: Abnormal   Collection Time: 08/10/24 11:44 PM  Result Value Ref Range   Glucose-Capillary 182 (H) 70 - 99 mg/dL    Comment: Glucose reference range applies only  to samples taken after fasting for at least 8 hours.  Glucose, capillary     Status: Abnormal   Collection Time: 08/11/24  6:21 AM  Result Value Ref Range   Glucose-Capillary 126 (H) 70 - 99 mg/dL    Comment: Glucose reference range applies only to samples taken after fasting for at least 8 hours.  Basic metabolic panel with GFR     Status: Abnormal   Collection Time: 08/11/24  8:40 AM  Result Value Ref Range   Sodium 131 (L) 135 - 145 mmol/L   Potassium 4.4 3.5 - 5.1 mmol/L   Chloride 98 98 - 111 mmol/L   CO2 24 22 - 32 mmol/L   Glucose, Bld 178 (H) 70 - 99 mg/dL    Comment: Glucose reference range applies only to samples taken after fasting for at least 8 hours.   BUN 22 8 - 23 mg/dL   Creatinine, Ser 9.03 0.44 - 1.00 mg/dL   Calcium 8.6 (L) 8.9 - 10.3 mg/dL   GFR, Estimated >39 >39 mL/min    Comment: (NOTE) Calculated using the CKD-EPI Creatinine Equation (2021)    Anion gap 9 5 - 15    Comment: Performed at [facility], [address], [place], [place] [address]  CBC     Status: Abnormal   Collection Time: 08/11/24  8:40 AM  Result Value Ref Range   WBC 13.8 (H) 4.0 - 10.5 K/uL   RBC 3.43 (L) 3.87 - 5.11 MIL/uL   Hemoglobin 10.8 (L) 12.0 - 15.0 g/dL   HCT 68.0 (L) 63.9 - 53.9 %   MCV 93.0 80.0 - 100.0 fL   MCH 31.5 26.0 - 34.0 pg   MCHC 33.9 30.0 - 36.0 g/dL   RDW 85.7 88.4 - 84.4 %   Platelets 199 150 - 400 K/uL   nRBC 0.0 0.0 - 0.2 %    Comment: Performed at [facility], [address], [place], [place] [address]  Glucose, capillary     Status: Abnormal   Collection Time: 08/11/24 12:05 PM  Result Value Ref Range   Glucose-Capillary 110 (H) 70 - 99 mg/dL    Comment:  Glucose reference range applies only to samples taken after fasting for at least 8 hours.    DG Chest Port 1 View Result Date: 08/11/2024 EXAM: 1 VIEW(S) XRAY OF THE CHEST 08/11/2024 05:32:00 AM COMPARISON: 08/10/2024 CLINICAL HISTORY: The patient is in a post-operative state. ICD10: 747648 Post-operative state. FINDINGS: LINES, TUBES AND DEVICES: Right upper extremity PICC in place with tip projecting over the lower SVC. Right basilar chest tube in place. The previously noted esophageal stent extending from above the level of the carina to the GE junction has migrated distally; it now appears to be completely within the stomach. LUNGS AND PLEURA: Postoperative changes from right middle lobectomy. Associated right lung volume loss. Small right pleural effusion. No pneumothorax. HEART AND MEDIASTINUM: No acute abnormality of the cardiac and mediastinal silhouettes. BONES AND SOFT TISSUES: No acute osseous abnormality. IMPRESSION: 1. Distal migration of the esophageal stent, now appearing entirely within the stomach, most consistent with stent migration/dislodgement. 2. Postoperative changes from right middle lobectomy with associated right lung volume loss and small right pleural effusion. 3. The urgent finding will be called to the ordering provider by the Professional Radiology Assistants (PRAs) and documented in the [facility] dashboard. Electronically signed by: Waddell Calk MD 08/11/2024 07:05 AM EST RP Workstation: [id number]   DG Chest Port 1 View Result  Date: 08/10/2024 CLINICAL DATA:  Right pleural effusion EXAM: PORTABLE CHEST 1 VIEW COMPARISON:  August 08, 2024 FINDINGS: The heart size and mediastinal contours are within normal limits. Stable esophageal stent. Left lung is clear. Stable right upper lobe opacity concerning for atelectasis or possibly loculated effusion. Postoperative changes are noted in right lower lobe with minimal right basilar atelectasis or scarring and small right pleural  effusion. The visualized skeletal structures are unremarkable. IMPRESSION: Stable right upper lobe opacity concerning for atelectasis or possibly loculated effusion. Stable postoperative changes in right lower lobe with minimal right basilar atelectasis or scarring and small right pleural effusion. Electronically Signed   By: Lynwood Landy Raddle M.D.   On: 08/10/2024 08:32   Review of Systems Blood pressure (!) 160/76, pulse 94, temperature 98.5 F (36.9 C), temperature source Oral, resp. rate 20, height 5' 3 (1.6 m), weight 70 kg, last menstrual period 10/26/1998, SpO2 97%. Physical Exam Constitutional:      General: She is not in acute distress.    Appearance: She is ill-appearing. She is not diaphoretic.  HENT:     Head: Normocephalic and atraumatic.     Mouth/Throat:     Mouth: Mucous membranes are moist.  Eyes:     Extraocular Movements: Extraocular movements intact.     Pupils: Pupils are equal, round, and reactive to light.  Cardiovascular:     Rate and Rhythm: Normal rate and regular rhythm.  Chest:     Comments: Chest tube on right side of the chest Musculoskeletal:        General: Normal range of motion.     Cervical back: Neck supple.  Skin:    General: Skin is warm and dry.  Neurological:     Mental Status: She is alert and oriented to person, place, and time.  Psychiatric:        Attention and Perception: Attention normal.        Mood and Affect: Mood is anxious and depressed.        Speech: Speech normal.        Behavior: Behavior is not agitated.   Assessment/Plan: 1) Esophageal perforation status post diverticulectomy-mucosal defect noticed 30 cm from the incisors-stented on 08/04/24 with stent migration into the stomach-patient now has recurrent aspiration and cough-CT surgery is requesting that the stent from the stomach be removed and another stent be placed into the esophagus to seal the leak. As per my discussion with Lemond Cera, PA with CT surgery we have procured  clearance from Dr. Charlott and Dr. Shyrl to proceed with a EGD tomorrow. 2) Invasive well to moderately differentiated adenocarcinoma the right middle lobe with mucinous features with positive lymph nodes status post right middle lobectomy. 3) HTN/Hyperlipidemia. 4) Anxiety and depression. 5) AODM. 6) OSA. 7) History of tobacco use.   Renaye Sous 08/11/2024, 12:59 PM         [1]  Allergies Allergen Reactions   Diltiazem  Hcl Hives    Not sure if she has a problem with this medication.   Penicillins     Tolerated Zosyn  Jan 2026   Simvastatin Other (See Comments)    myalgia   Wellbutrin [Bupropion] Hives

## 2024-08-11 NOTE — Progress Notes (Signed)
" ° °   °  [address] [place] [address]             [phone]     Brief procedure:   After betadine prep and EMLA  cream placed a #1 MH suture was placed  to help secure the chest tube. Patient tolerated well.   Naomi Fitton E Raylea Adcox, PA-C  "

## 2024-08-11 NOTE — Progress Notes (Signed)
 Mobility Specialist Progress Note;   08/11/24 1202  Therapy Vitals  Temp 98.5 F (36.9 C)  Temp Source Oral  Pulse Rate 94  Resp 20  Patient Position (if appropriate) Sitting  Mobility  Activity Ambulated with assistance  Level of Assistance Standby assist, set-up cues, supervision of patient - no hands on  Assistive Device Other (Comment) (IV Pole)  Distance Ambulated (ft) 500 ft  Activity Response Tolerated well  Mobility Referral Yes  Mobility visit 1 Mobility  Mobility Specialist Start Time (ACUTE ONLY) 1150  Mobility Specialist Stop Time (ACUTE ONLY) 1202  Mobility Specialist Time Calculation (min) (ACUTE ONLY) 12 min   Pt received in bed agreeable to mobility. Ambulated with IV pole well, SV for safety. Upon ambulation no complaints. Once returned to room patient independently returned to bed. Call bell and personal belongings in reach. All needs met.   Ricky Janeal MATSU, BS Mobility Specialist Please contact via Special Educational Needs Teacher or [employer] [phone]

## 2024-08-11 NOTE — Plan of Care (Signed)
" °  Problem: Education: Goal: Knowledge of the prescribed therapeutic regimen will improve Outcome: Progressing   Problem: Bowel/Gastric: Goal: Gastrointestinal status for postoperative course will improve Outcome: Progressing   Problem: Cardiac: Goal: Ability to maintain an adequate cardiac output Outcome: Progressing   Problem: Cardiac: Goal: Will show no evidence of cardiac arrhythmias Outcome: Progressing   Problem: Nutritional: Goal: Will attain and maintain optimal nutritional status Outcome: Progressing   Problem: Neurological: Goal: Will regain or maintain usual level of consciousness Outcome: Progressing   Problem: Respiratory: Goal: Will regain and/or maintain adequate ventilation Outcome: Progressing   "

## 2024-08-11 NOTE — Progress Notes (Addendum)
" ° °   °  [address] [place] [address]             [phone]         5 Days Post-Op Procedures (LRB): EGD (ESOPHAGOGASTRODUODENOSCOPY) (N/A)  Subjective:  Patient developed coughing overnight.  This has been previously resolved  Objective: Vital signs in last 24 hours: Temp:  [98.3 F (36.8 C)-99.1 F (37.3 C)] 99 F (37.2 C) (01/14 0345) Pulse Rate:  [70-100] 74 (01/14 0345) Cardiac Rhythm: Normal sinus rhythm (01/13 2045) Resp:  [16-20] 20 (01/14 0345) BP: (132-158)/(57-70) 154/57 (01/14 0345) SpO2:  [94 %-99 %] 99 % (01/14 0420)  Intake/Output from previous day: 01/13 0701 - 01/14 0700 In: 1995.7 [P.O.:1560; I.V.:388.9; IV Piggyback:46.8] Out: 930 [Urine:900; Chest Tube:30]  General appearance: alert, cooperative, and no distress Heart: regular rate and rhythm Lungs: clear to auscultation bilaterally Abdomen: soft, non-tender; bowel sounds normal; no masses,  no organomegaly Wound: clean and dry  Lab Results: No results for input(s): WBC, HGB, HCT, PLT in the last 72 hours. BMET:  Recent Labs    08/08/24 0743 08/09/24 0350  NA 138 137  K 4.3 4.1  CL 105 104  CO2 25 25  GLUCOSE 101* 132*  BUN 28* 26*  CREATININE 0.85 0.78  CALCIUM 8.7* 8.4*    PT/INR: No results for input(s): LABPROT, INR in the last 72 hours. ABG    Component Value Date/Time   TCO2 25 11/10/2009 0952   CBG (last 3)  Recent Labs    08/10/24 1729 08/10/24 2344 08/11/24 0621  GLUCAP 160* 182* 126*    Assessment/Plan: S/P Procedures (LRB): EGD (ESOPHAGOGASTRODUODENOSCOPY) (N/A)  CV- NSR, + HTN- resume IV Lopressor  15 mg IV Q6, Clonidine  patch, Nitrodur patch at 0.2.SABRA prn Hydralazine  Pulm- CT output remains low, trace right effusion, stable appearance of opacity GI- strict NPO... CXR shows migration of esophageal stent.SABRA discussed with Dr. Shyrl who recommended consulting GI for assistance.. will discuss need to resume TPN with Dr.  Kerrin ID- afebrile, CBC ordered this morning.. will transition back to IV ABX/Fungal coverage until able to adequately take PO Pain control- stable, well controlled on current regimen   LOS: 16 days    Rocky Shad, PA-C 08/11/2024 7:25 AM Patient seen and examined, agree with above Unfortunately, stent migrated into stomach again Will consult GI to see if they have any ideas to prevent migration  Elspeth C. Kerrin, MD [facility] [phone]    "

## 2024-08-12 ENCOUNTER — Inpatient Hospital Stay (HOSPITAL_COMMUNITY)

## 2024-08-12 ENCOUNTER — Encounter (HOSPITAL_COMMUNITY): Payer: Self-pay | Admitting: Thoracic Surgery (Cardiothoracic Vascular Surgery)

## 2024-08-12 LAB — CBC
HCT: 30.2 % — ABNORMAL LOW (ref 36.0–46.0)
Hemoglobin: 10.5 g/dL — ABNORMAL LOW (ref 12.0–15.0)
MCH: 31.8 pg (ref 26.0–34.0)
MCHC: 34.8 g/dL (ref 30.0–36.0)
MCV: 91.5 fL (ref 80.0–100.0)
Platelets: 182 K/uL (ref 150–400)
RBC: 3.3 MIL/uL — ABNORMAL LOW (ref 3.87–5.11)
RDW: 14.4 % (ref 11.5–15.5)
WBC: 11.6 K/uL — ABNORMAL HIGH (ref 4.0–10.5)
nRBC: 0 % (ref 0.0–0.2)

## 2024-08-12 LAB — COMPREHENSIVE METABOLIC PANEL WITH GFR
ALT: 12 U/L (ref 0–44)
AST: 20 U/L (ref 15–41)
Albumin: 2.3 g/dL — ABNORMAL LOW (ref 3.5–5.0)
Alkaline Phosphatase: 44 U/L (ref 38–126)
Anion gap: 8 (ref 5–15)
BUN: 24 mg/dL — ABNORMAL HIGH (ref 8–23)
CO2: 22 mmol/L (ref 22–32)
Calcium: 7 mg/dL — ABNORMAL LOW (ref 8.9–10.3)
Chloride: 105 mmol/L (ref 98–111)
Creatinine, Ser: 1.54 mg/dL — ABNORMAL HIGH (ref 0.44–1.00)
GFR, Estimated: 37 mL/min — ABNORMAL LOW
Glucose, Bld: 96 mg/dL (ref 70–99)
Potassium: 3.5 mmol/L (ref 3.5–5.1)
Sodium: 135 mmol/L (ref 135–145)
Total Bilirubin: 0.3 mg/dL (ref 0.0–1.2)
Total Protein: 4.4 g/dL — ABNORMAL LOW (ref 6.5–8.1)

## 2024-08-12 LAB — URINALYSIS, W/ REFLEX TO CULTURE (INFECTION SUSPECTED): Glucose, UA: NEGATIVE mg/dL

## 2024-08-12 LAB — BASIC METABOLIC PANEL WITH GFR
Anion gap: 10 (ref 5–15)
BUN: 23 mg/dL (ref 8–23)
CO2: 25 mmol/L (ref 22–32)
Calcium: 8.5 mg/dL — ABNORMAL LOW (ref 8.9–10.3)
Chloride: 98 mmol/L (ref 98–111)
Creatinine, Ser: 1.43 mg/dL — ABNORMAL HIGH (ref 0.44–1.00)
GFR, Estimated: 40 mL/min — ABNORMAL LOW
Glucose, Bld: 128 mg/dL — ABNORMAL HIGH (ref 70–99)
Potassium: 4 mmol/L (ref 3.5–5.1)
Sodium: 134 mmol/L — ABNORMAL LOW (ref 135–145)

## 2024-08-12 LAB — GLUCOSE, CAPILLARY
Glucose-Capillary: 124 mg/dL — ABNORMAL HIGH (ref 70–99)
Glucose-Capillary: 130 mg/dL — ABNORMAL HIGH (ref 70–99)
Glucose-Capillary: 120 mg/dL — ABNORMAL HIGH (ref 70–99)
Glucose-Capillary: 102 mg/dL — ABNORMAL HIGH (ref 70–99)
Glucose-Capillary: 96 mg/dL (ref 70–99)

## 2024-08-12 MED ORDER — PHENOL 1.4 % MT LIQD
2.0000 | OROMUCOSAL | Status: AC | PRN
  Filled 2024-08-12: qty 177

## 2024-08-12 MED ORDER — ACETAMINOPHEN 10 MG/ML IV SOLN
1000.0000 mg | Freq: Four times a day (QID) | INTRAVENOUS | Status: AC
  Administered 2024-08-13 – 2024-08-14 (×3): 1000 mg via INTRAVENOUS
  Filled 2024-08-12 (×5): qty 100

## 2024-08-12 MED ORDER — LACTATED RINGERS IV BOLUS
500.0000 mL | Freq: Once | INTRAVENOUS | Status: AC
  Administered 2024-08-12: 500 mL via INTRAVENOUS

## 2024-08-12 MED ORDER — ACETAMINOPHEN 10 MG/ML IV SOLN: 1000.0000 mg | Freq: Four times a day (QID) | INTRAVENOUS | Status: DC

## 2024-08-12 MED ORDER — SODIUM CHLORIDE 0.9 % IV SOLN: INTRAVENOUS | Status: AC

## 2024-08-12 MED ORDER — ACETAMINOPHEN 10 MG/ML IV SOLN
1000.0000 mg | Freq: Four times a day (QID) | INTRAVENOUS | Status: AC
  Administered 2024-08-12 – 2024-08-13 (×4): 1000 mg via INTRAVENOUS
  Filled 2024-08-12 (×4): qty 100

## 2024-08-12 MED ORDER — ACETAMINOPHEN 10 MG/ML IV SOLN
1000.0000 mg | Freq: Four times a day (QID) | INTRAVENOUS | Status: AC | PRN
  Filled 2024-08-12: qty 100

## 2024-08-12 MED ORDER — SODIUM CHLORIDE 0.9 % IV BOLUS
500.0000 mL | Freq: Once | INTRAVENOUS | Status: AC
  Administered 2024-08-12: 500 mL via INTRAVENOUS

## 2024-08-12 MED ORDER — VANCOMYCIN VARIABLE DOSE PER UNSTABLE RENAL FUNCTION (PHARMACIST DOSING): Status: DC

## 2024-08-12 MED ORDER — ACETAMINOPHEN 10 MG/ML IV SOLN
1000.0000 mg | Freq: Four times a day (QID) | INTRAVENOUS | Status: DC
  Administered 2024-08-14: 1000 mg via INTRAVENOUS
  Filled 2024-08-12 (×2): qty 100

## 2024-08-12 MED ORDER — VANCOMYCIN HCL 1500 MG/300ML IV SOLN
1500.0000 mg | Freq: Once | INTRAVENOUS | Status: AC
  Administered 2024-08-12: 1500 mg via INTRAVENOUS
  Filled 2024-08-12: qty 300

## 2024-08-12 NOTE — Progress Notes (Signed)
 Subjective: No complaints at this time.  Objective: Vital signs in last 24 hours: Temp:  [99 F (37.2 C)-103.2 F (39.6 C)] 99.5 F (37.5 C) (01/15 1056) Pulse Rate:  [93-105] 105 (01/15 0412) Resp:  [19-21] 19 (01/15 1056) BP: (115-196)/(50-80) 115/50 (01/15 1056) SpO2:  [92 %-100 %] 92 % (01/15 1056) Last BM Date : 08/09/24  Intake/Output from previous day: 01/14 0701 - 01/15 0700 In: 453.8 [IV Piggyback:453.8] Out: 44 [Chest Tube:44] Intake/Output this shift: No intake/output data recorded.  General appearance: alert and no distress Resp: upper airway rhonchi, mild decrease breath sounds in the RLL. Cardio: regular rate and rhythm, S1, S2 normal, no murmur, click, rub or gallop GI: soft, non-tender; bowel sounds normal; no masses,  no organomegaly  Lab Results: Recent Labs    08/11/24 0840 08/12/24 0546  WBC 13.8* 11.6*  HGB 10.8* 10.5*  HCT 31.9* 30.2*  PLT 199 182   BMET Recent Labs    08/11/24 0840 08/12/24 0546  NA 131* 134*  K 4.4 4.0  CL 98 98  CO2 24 25  GLUCOSE 178* 128*  BUN 22 23  CREATININE 0.96 1.43*  CALCIUM 8.6* 8.5*   LFT No results for input(s): PROT, ALBUMIN , AST, ALT, ALKPHOS, BILITOT, BILIDIR, IBILI in the last 72 hours. PT/INR No results for input(s): LABPROT, INR in the last 72 hours. Hepatitis Panel No results for input(s): HEPBSAG, HCVAB, HEPAIGM, HEPBIGM in the last 72 hours. C-Diff No results for input(s): CDIFFTOX in the last 72 hours. Fecal Lactopherrin No results for input(s): FECLLACTOFRN in the last 72 hours.  Studies/Results: DG Chest Port 1 View Result Date: 08/12/2024 CLINICAL DATA:  Pleural effusion EXAM: PORTABLE CHEST 1 VIEW COMPARISON:  August 11, 2024 FINDINGS: Normal cardiac size. Left lung is clear. Right-sided PICC line is unchanged. Stable right infrahilar findings consistent with prior lobectomy. Mild right basilar subsegmental atelectasis or scarring is noted with possible  small pleural effusion. Bony thorax is unremarkable. Esophageal stent is again noted in expected position of stomach. IMPRESSION: Mild right basilar subsegmental atelectasis or scarring is noted with possible small pleural effusion. Electronically Signed   By: Lynwood Landy Raddle M.D.   On: 08/12/2024 12:55   DG Chest Port 1 View Result Date: 08/11/2024 EXAM: 1 VIEW(S) XRAY OF THE CHEST 08/11/2024 05:32:00 AM COMPARISON: 08/10/2024 CLINICAL HISTORY: The patient is in a post-operative state. ICD10: 747648 Post-operative state. FINDINGS: LINES, TUBES AND DEVICES: Right upper extremity PICC in place with tip projecting over the lower SVC. Right basilar chest tube in place. The previously noted esophageal stent extending from above the level of the carina to the GE junction has migrated distally; it now appears to be completely within the stomach. LUNGS AND PLEURA: Postoperative changes from right middle lobectomy. Associated right lung volume loss. Small right pleural effusion. No pneumothorax. HEART AND MEDIASTINUM: No acute abnormality of the cardiac and mediastinal silhouettes. BONES AND SOFT TISSUES: No acute osseous abnormality. IMPRESSION: 1. Distal migration of the esophageal stent, now appearing entirely within the stomach, most consistent with stent migration/dislodgement. 2. Postoperative changes from right middle lobectomy with associated right lung volume loss and small right pleural effusion. 3. The urgent finding will be called to the ordering provider by the Professional Radiology Assistants (PRAs) and documented in the [facility] dashboard. Electronically signed by: Waddell Calk MD 08/11/2024 07:05 AM EST RP Workstation: [id number]    Medications: Scheduled:  Chlorhexidine  Gluconate Cloth  6 each Topical Daily   cloNIDine   0.3 mg Transdermal Weekly  enoxaparin  (LOVENOX ) injection  40 mg Subcutaneous Daily   fentaNYL   1 patch Transdermal Q72H   insulin  aspart  0-15 Units Subcutaneous Q6H    levalbuterol   0.63 mg Nebulization Q8H   metoprolol  tartrate  15 mg Intravenous Q6H   neomycin -bacitracin -polymyxin   Topical BID   nicotine   7 mg Transdermal Daily   nitroGLYCERIN   0.2 mg Transdermal Daily   pantoprazole  (PROTONIX ) IV  40 mg Intravenous Q24H   sodium chloride  flush  10-40 mL Intracatheter Q12H   vancomycin  variable dose per unstable renal function (pharmacist dosing)   Does not apply See admin instructions   Continuous:  sodium chloride      sodium chloride  100 mL/hr at 08/12/24 1035   acetaminophen      acetaminophen  1,000 mg (08/12/24 1145)   Followed by   NOREEN ON 08/13/2024] acetaminophen      Followed by   NOREEN ON 08/14/2024] acetaminophen      Followed by   NOREEN ON 08/15/2024] acetaminophen      fluconazole  (DIFLUCAN ) IV 400 mg (08/12/24 1149)   piperacillin -tazobactam (ZOSYN )  IV 12.5 mL/hr at 08/12/24 0617   promethazine  (PHENERGAN ) injection (IM or IVPB) Stopped (08/07/24 1907)    Assessment/Plan: 1) Aspiration pneumonia. 2) Fever with Tmax at 103. 3) Esophageal stent migration.   Clinically she appears stable.  She does not have any SOB and she does not voice any complaints at this time.  She was evaluated by CCS and CVTS and determined to have an aspiration pneumonia.    Plan: 1) EGD with stent removal and placement of a new stent tomorrow at 3 PM.  Placement of a new stent can still result in stent migration. 2) Continue with Zosyn . 3) Maintain NPO.  LOS: 17 days   Corley Kohls D 08/12/2024, 2:20 PM

## 2024-08-12 NOTE — Consult Note (Signed)
 "  NAME:  [name], MRN:  [id number], DOB:  01/13/1956, LOS: 17 ADMISSION DATE:  07/26/2024, CONSULTATION DATE:  1/15 REFERRING MD:  Kerrin, CHIEF COMPLAINT:  sepsis   History of Present Illness:  [name] is a 69 y/o woman with a history of tobacco abuse and lung adenocarcinoma status post RML lobectomy on 12/29.  At the time of surgery was a large cyst that was resected. Despite oversewing it time resection she later developed an esophageal leak.  She continues to with her R chest tube in since surgery.  She required and esophageal stent, which has unfortunately migrated to stomach.  Plans have been for EGD for repositioning and clipping in place today with GI. Tmax 103.2 this morning.  She reports after going back to eating and taking her oral medication she developed increased sputum production for 4.  On clear liquid diet she had  improved sputum production. She has had urinary symptoms since her foley at the time of surgery; no recent foley catheter. PCCM consulted for sepsis.  Pertinent  Medical History  Lung adenocarcinoma Latent  TB, treated in 1980 HTN HLD DM OSA Tobacco abuse  Significant Hospital Events: Including procedures, antibiotic start and stop dates in addition to other pertinent events   12/29 RML lobectomy 1/7 EGD with esophageal stent 1/9 EGD  with stent reposition after migration to stomach   Interim History / Subjective:    Objective    Blood pressure (!) 175/58, pulse (!) 105, temperature (!) 103.2 F (39.6 C), temperature source Oral, resp. rate 20, height 5' 3 (1.6 m), weight 70 kg, last menstrual period 10/26/1998, SpO2 92%.        Intake/Output Summary (Last 24 hours) at 08/12/2024 0820 Last data filed at 08/12/2024 0617 Gross per 24 hour  Intake 453.84 ml  Output 44 ml  Net 409.84 ml   Filed Weights   08/08/24 0230 08/09/24 0315 08/10/24 0430  Weight: 71.6 kg 71.1 kg 70 kg    Examination: General: chronically ill appearing woman  lying in bed in NAD HENT: [place]/AT, eyes anicteric. Dry oral mucosa Lungs: breathing comfortably on [place], faint rhales on theR, mostly improved with repeated deep breaths. Chest tube on the R. Cannister with sputum with cloudy, and somewhat clumped white material on the top. Cardiovascular: S1S2, RRR Abdomen: soft,NT Extremities: no peripheral edema, minimal muscle mass Neuro: awake, alert, able to sit up in bed independently and fairly easily   BUN 23 Cr 1.43 WBC 11.6, downtrending  CXR personally reviewed>R chest tube, small R dependent effusion. Improving R medial LL infiltrate  MBSS form 1/8 reviewed-- series 6 shows contrast refluxing up from trachea in lowest part of the image, not in pharynx.   Resolved problem list   Assessment and Plan   Fevers, at risk for developing sepsis -blood cultures -UA w/ culture- d/w RN to do straight cath -sputum culture -con't antibiotics-- agree with zosyn , fluconazole . Will add vanc empirically; would deesalate in 48 hrs if cultures remain negative.  -CT C/A/P today  Likely previous aspiration during MBSS-- reviewed images with TCTS and coughed up contrast was potentially previous aspirated. Low concern for ET or EB fistula -sputum culture -watch chest tube output, although if ET or EB fistula would not expect chest tube output to change, but she may cough up food particles/ liquids -if significant concern remained would consider bronch at a later time -diet per TCTS; currently NPO  AKI -strict I/O -LR 500cc bolus  Hyponatremia, mild, improving -monitor  Lung adenocarcinoma, s/p lobectomy -post op care per TCTS -chest tube per TCTS -pain control as ordered- fentanyl  patch, dilaudid ,   H/o tobacco abuse -nicotine  patch -recommend total cessation  Hyperglycemia -SSI PRN -goal BG 140-180     Labs   CBC: Recent Labs  Lab 08/07/24 0200 08/11/24 0840 08/12/24 0546  WBC 15.4* 13.8* 11.6*  HGB 11.0* 10.8* 10.5*  HCT 32.6*  31.9* 30.2*  MCV 93.1 93.0 91.5  PLT 196 199 182    Basic Metabolic Panel: Recent Labs  Lab 08/07/24 0200 08/08/24 0743 08/09/24 0350 08/11/24 0840 08/12/24 0546  NA 137 138 137 131* 134*  K 4.2 4.3 4.1 4.4 4.0  CL 103 105 104 98 98  CO2 26 25 25 24 25   GLUCOSE 210* 101* 132* 178* 128*  BUN 23 28* 26* 22 23  CREATININE 0.79 0.85 0.78 0.96 1.43*  CALCIUM 8.4* 8.7* 8.4* 8.6* 8.5*  MG  --   --  2.0  --   --   PHOS  --  2.7 3.1  --   --    GFR: Estimated Creatinine Clearance: 36.3 mL/min (A) (by C-G formula based on SCr of 1.43 mg/dL (H)). Recent Labs  Lab 08/07/24 0200 08/11/24 0840 08/12/24 0546  WBC 15.4* 13.8* 11.6*    Liver Function Tests: Recent Labs  Lab 08/08/24 0743 08/09/24 0350  AST  --  14*  ALT  --  20  ALKPHOS  --  55  BILITOT  --  0.5  PROT  --  5.6*  ALBUMIN  3.2* 2.9*   No results for input(s): LIPASE, AMYLASE in the last 168 hours. No results for input(s): AMMONIA in the last 168 hours.  ABG    Component Value Date/Time   TCO2 25 11/10/2009 0952     Coagulation Profile: No results for input(s): INR, PROTIME in the last 168 hours.  Cardiac Enzymes: No results for input(s): CKTOTAL, CKMB, CKMBINDEX, TROPONINI in the last 168 hours.  HbA1C: Hgb A1c MFr Bld  Date/Time Value Ref Range Status  07/20/2024 01:30 PM 5.9 (H) 4.8 - 5.6 % Final    Comment:    (NOTE) Diagnosis of Diabetes The following HbA1c ranges recommended by the American Diabetes Association (ADA) may be used as an aid in the diagnosis of diabetes mellitus.  Hemoglobin             Suggested A1C NGSP%              Diagnosis  <5.7                   Non Diabetic  5.7-6.4                Pre-Diabetic  >6.4                   Diabetic  <7.0                   Glycemic control for                       adults with diabetes.    07/07/2023 06:30 PM 5.7 (H) 4.8 - 5.6 % Final    Comment:    (NOTE) Pre diabetes:          5.7%-6.4%  Diabetes:               >6.4%  Glycemic control for   <7.0% adults with diabetes     CBG: Recent  Labs  Lab 08/11/24 1205 08/11/24 1814 08/11/24 2355 08/12/24 0538 08/12/24 0636  GLUCAP 110* 106* 112* 124* 130*    Review of Systems:   Review of Systems  Constitutional:  Positive for fever.  Respiratory:  Positive for cough and sputum production.   Genitourinary:  Positive for dysuria.     Past Medical History:  She,  has a past medical history of Anxiety, Depression, Diabetes mellitus without complication (HCC), Dysrhythmia, Heart murmur, Hyperlipidemia, Hypertension, Sleep apnea, Tachycardia, and Tuberculosis (1980).   Surgical History:   Past Surgical History:  Procedure Laterality Date   APPENDECTOMY     CESAREAN SECTION     ENDOBRONCHIAL ULTRASOUND Bilateral 06/03/2024   Procedure: ENDOBRONCHIAL ULTRASOUND (EBUS);  Surgeon: Catherine Cools, MD;  Location: [facility] ENDOSCOPY;  Service: Pulmonary;  Laterality: Bilateral;   ESOPHAGOGASTRODUODENOSCOPY N/A 08/04/2024   Procedure: EGD (ESOPHAGOGASTRODUODENOSCOPY) With X  WALLFLEX ESOPHAGEAL STENT PLACEMENT;  Surgeon: Shyrl Linnie KIDD, MD;  Location: [facility] OR;  Service: Thoracic;  Laterality: N/A;   ESOPHAGOGASTRODUODENOSCOPY N/A 08/06/2024   Procedure: EGD (ESOPHAGOGASTRODUODENOSCOPY);  Surgeon: Daniel Con RAMAN, MD;  Location: [facility] OR;  Service: Thoracic;  Laterality: N/A;   LOBECTOMY, LUNG, ROBOT-ASSISTED, USING VATS Right 07/26/2024   Procedure: ROBOT ASSISTED RIGHT MIDDLE LOBECTOMY; RESECTION OF ESOPHAGEAL DIVERTICULUM;  Surgeon: Kerrin Elspeth BROCKS, MD;  Location: [facility] OR;  Service: Thoracic;  Laterality: Right;  ROBOTIC RIGHT MIDDLE LOBECTOMY   LYMPH NODE BIOPSY  07/26/2024   Procedure: LYMPH NODE BIOPSY;  Surgeon: Kerrin Elspeth BROCKS, MD;  Location: [facility] OR;  Service: Thoracic;;   TONSILLECTOMY  1974   VIDEO BRONCHOSCOPY WITH ENDOBRONCHIAL NAVIGATION Right 06/03/2024   Procedure: VIDEO BRONCHOSCOPY WITH ENDOBRONCHIAL NAVIGATION;  Surgeon:  Catherine Cools, MD;  Location: [facility] ENDOSCOPY;  Service: Pulmonary;  Laterality: Right;     Social History:   reports that she quit smoking about 2 months ago. Her smoking use included cigarettes. She started smoking about 4 years ago. She has a 1.9 pack-year smoking history. She has never used smokeless tobacco. She reports that she does not currently use alcohol.   Family History:  Her family history includes Breast cancer in her paternal aunt; Coronary artery disease in her father; Heart attack in her mother; Heart block in her brother. There is no history of BRCA 1/2.   Allergies Allergies[1]   Home Medications  Prior to Admission medications  Medication Sig Start Date End Date Taking? Authorizing Provider  acetaminophen  (TYLENOL ) 500 MG tablet Take 1,000 mg by mouth every 6 (six) hours as needed for mild pain (pain score 1-3) or moderate pain (pain score 4-6).   Yes [provider]  ALPRAZolam  (XANAX ) 1 MG tablet Take 1 mg by mouth 3 (three) times daily as needed for sleep or anxiety. 02/11/22  Yes [provider]  aspirin  EC 81 MG tablet Take 81 mg by mouth daily. Swallow whole.   Yes [provider]  calcium carbonate (TUMS - DOSED IN MG ELEMENTAL CALCIUM) 500 MG chewable tablet Chew 1 tablet by mouth daily as needed for indigestion or heartburn.   Yes [provider]  HYDROcodone-acetaminophen  (NORCO) 10-325 MG tablet Take 1 tablet by mouth every 6 (six) hours as needed for moderate pain (pain score 4-6) or severe pain (pain score 7-10).   Yes [provider]  ibuprofen (ADVIL) 200 MG tablet Take 400-800 mg by mouth every 6 (six) hours as needed for mild pain (pain score 1-3) or moderate pain (pain score 4-6).   Yes [provider]  irbesartan  (AVAPRO ) 150 MG tablet Take 150 mg by mouth daily. 05/12/23  Yes [provider]  JARDIANCE 25 MG TABS tablet Take 25 mg by mouth every morning. 06/30/24  Yes [provider]   levalbuterol  (XOPENEX  HFA) 45 MCG/ACT inhaler Inhale 1 puff into the lungs every 8 (eight) hours as needed for wheezing or shortness of breath. 06/23/18  Yes [provider]  metaxalone (SKELAXIN) 800 MG tablet Take 800 mg by mouth 3 (three) times daily as needed for muscle spasms. 03/03/22  Yes [provider]  metoprolol  succinate (TOPROL -XL) 50 MG 24 hr tablet 1 TABLET ORALLY ONCE A DAY 90 DAYS 07/10/22  Yes Riddle, Suzann, NP  nicotine  (NICODERM CQ  - DOSED IN MG/24 HOURS) 21 mg/24hr patch Place 10.5 mg onto the skin daily. Cuts the patch   Yes [provider]  pravastatin  (PRAVACHOL ) 40 MG tablet Take 1 tablet by mouth daily.   Yes [provider]  propranolol (INDERAL) 10 MG tablet Take 10-20 mg by mouth daily as needed (tachacardia). 09/11/15  Yes [provider]  tiZANidine (ZANAFLEX) 2 MG tablet Take 2 mg by mouth every 8 (eight) hours as needed for muscle spasms. 05/27/24  Yes [provider]  traZODone  (DESYREL ) 100 MG tablet Take 200 mg by mouth at bedtime. 02/13/22  Yes [provider]     Critical care time:       Leita SHAUNNA Gaskins, DO 08/12/24 8:21 AM [facility]  For contact information, see Amion. If no response to pager, please call PCCM 2H APP. After hours, 7PM- 7AM, please call on call APP for 2H.           [1]  Allergies Allergen Reactions   Diltiazem  Hcl Hives    Not sure if she has a problem with this medication.   Penicillins     Tolerated Zosyn  Jan 2026   Simvastatin Other (See Comments)    myalgia   Wellbutrin [Bupropion] Hives   "

## 2024-08-12 NOTE — H&P (View-Only) (Signed)
 Subjective: No complaints at this time.  Objective: Vital signs in last 24 hours: Temp:  [99 F (37.2 C)-103.2 F (39.6 C)] 99.5 F (37.5 C) (01/15 1056) Pulse Rate:  [93-105] 105 (01/15 0412) Resp:  [19-21] 19 (01/15 1056) BP: (115-196)/(50-80) 115/50 (01/15 1056) SpO2:  [92 %-100 %] 92 % (01/15 1056) Last BM Date : 08/09/24  Intake/Output from previous day: 01/14 0701 - 01/15 0700 In: 453.8 [IV Piggyback:453.8] Out: 44 [Chest Tube:44] Intake/Output this shift: No intake/output data recorded.  General appearance: alert and no distress Resp: upper airway rhonchi, mild decrease breath sounds in the RLL. Cardio: regular rate and rhythm, S1, S2 normal, no murmur, click, rub or gallop GI: soft, non-tender; bowel sounds normal; no masses,  no organomegaly  Lab Results: Recent Labs    08/11/24 0840 08/12/24 0546  WBC 13.8* 11.6*  HGB 10.8* 10.5*  HCT 31.9* 30.2*  PLT 199 182   BMET Recent Labs    08/11/24 0840 08/12/24 0546  NA 131* 134*  K 4.4 4.0  CL 98 98  CO2 24 25  GLUCOSE 178* 128*  BUN 22 23  CREATININE 0.96 1.43*  CALCIUM 8.6* 8.5*   LFT No results for input(s): PROT, ALBUMIN , AST, ALT, ALKPHOS, BILITOT, BILIDIR, IBILI in the last 72 hours. PT/INR No results for input(s): LABPROT, INR in the last 72 hours. Hepatitis Panel No results for input(s): HEPBSAG, HCVAB, HEPAIGM, HEPBIGM in the last 72 hours. C-Diff No results for input(s): CDIFFTOX in the last 72 hours. Fecal Lactopherrin No results for input(s): FECLLACTOFRN in the last 72 hours.  Studies/Results: DG Chest Port 1 View Result Date: 08/12/2024 CLINICAL DATA:  Pleural effusion EXAM: PORTABLE CHEST 1 VIEW COMPARISON:  August 11, 2024 FINDINGS: Normal cardiac size. Left lung is clear. Right-sided PICC line is unchanged. Stable right infrahilar findings consistent with prior lobectomy. Mild right basilar subsegmental atelectasis or scarring is noted with possible  small pleural effusion. Bony thorax is unremarkable. Esophageal stent is again noted in expected position of stomach. IMPRESSION: Mild right basilar subsegmental atelectasis or scarring is noted with possible small pleural effusion. Electronically Signed   By: Lynwood Landy Raddle M.D.   On: 08/12/2024 12:55   DG Chest Port 1 View Result Date: 08/11/2024 EXAM: 1 VIEW(S) XRAY OF THE CHEST 08/11/2024 05:32:00 AM COMPARISON: 08/10/2024 CLINICAL HISTORY: The patient is in a post-operative state. ICD10: 747648 Post-operative state. FINDINGS: LINES, TUBES AND DEVICES: Right upper extremity PICC in place with tip projecting over the lower SVC. Right basilar chest tube in place. The previously noted esophageal stent extending from above the level of the carina to the GE junction has migrated distally; it now appears to be completely within the stomach. LUNGS AND PLEURA: Postoperative changes from right middle lobectomy. Associated right lung volume loss. Small right pleural effusion. No pneumothorax. HEART AND MEDIASTINUM: No acute abnormality of the cardiac and mediastinal silhouettes. BONES AND SOFT TISSUES: No acute osseous abnormality. IMPRESSION: 1. Distal migration of the esophageal stent, now appearing entirely within the stomach, most consistent with stent migration/dislodgement. 2. Postoperative changes from right middle lobectomy with associated right lung volume loss and small right pleural effusion. 3. The urgent finding will be called to the ordering provider by the Professional Radiology Assistants (PRAs) and documented in the [facility] dashboard. Electronically signed by: Waddell Calk MD 08/11/2024 07:05 AM EST RP Workstation: [id number]    Medications: Scheduled:  Chlorhexidine  Gluconate Cloth  6 each Topical Daily   cloNIDine   0.3 mg Transdermal Weekly  enoxaparin  (LOVENOX ) injection  40 mg Subcutaneous Daily   fentaNYL   1 patch Transdermal Q72H   insulin  aspart  0-15 Units Subcutaneous Q6H    levalbuterol   0.63 mg Nebulization Q8H   metoprolol  tartrate  15 mg Intravenous Q6H   neomycin -bacitracin -polymyxin   Topical BID   nicotine   7 mg Transdermal Daily   nitroGLYCERIN   0.2 mg Transdermal Daily   pantoprazole  (PROTONIX ) IV  40 mg Intravenous Q24H   sodium chloride  flush  10-40 mL Intracatheter Q12H   vancomycin  variable dose per unstable renal function (pharmacist dosing)   Does not apply See admin instructions   Continuous:  sodium chloride      sodium chloride  100 mL/hr at 08/12/24 1035   acetaminophen      acetaminophen  1,000 mg (08/12/24 1145)   Followed by   NOREEN ON 08/13/2024] acetaminophen      Followed by   NOREEN ON 08/14/2024] acetaminophen      Followed by   NOREEN ON 08/15/2024] acetaminophen      fluconazole  (DIFLUCAN ) IV 400 mg (08/12/24 1149)   piperacillin -tazobactam (ZOSYN )  IV 12.5 mL/hr at 08/12/24 0617   promethazine  (PHENERGAN ) injection (IM or IVPB) Stopped (08/07/24 1907)    Assessment/Plan: 1) Aspiration pneumonia. 2) Fever with Tmax at 103. 3) Esophageal stent migration.   Clinically she appears stable.  She does not have any SOB and she does not voice any complaints at this time.  She was evaluated by CCS and CVTS and determined to have an aspiration pneumonia.    Plan: 1) EGD with stent removal and placement of a new stent tomorrow at 3 PM.  Placement of a new stent can still result in stent migration. 2) Continue with Zosyn . 3) Maintain NPO.  LOS: 17 days   Corley Kohls D 08/12/2024, 2:20 PM

## 2024-08-12 NOTE — Progress Notes (Signed)
" °   08/11/24 1949  Assess: MEWS Score  Temp (!) 101.9 F (38.8 C)  BP (!) 196/79  MAP (mmHg) 114  Pulse Rate 96  ECG Heart Rate 95  Resp 20  Level of Consciousness Alert  SpO2 95 %  O2 Device Room Air  Assess: MEWS Score  MEWS Temp 2  MEWS Systolic 0  MEWS Pulse 0  MEWS RR 0  MEWS LOC 0  MEWS Score 2  MEWS Score Color Yellow  Assess: if the MEWS score is Yellow or Red  Were vital signs accurate and taken at a resting state? Yes  Does the patient meet 2 or more of the SIRS criteria? Yes  Does the patient have a confirmed or suspected source of infection? No  MEWS guidelines implemented  No, previously yellow, continue vital signs every 4 hours  Assess: SIRS CRITERIA  SIRS Temperature  1  SIRS Respirations  0  SIRS Pulse 1  SIRS WBC 1  SIRS Score Sum  3    "

## 2024-08-12 NOTE — Progress Notes (Addendum)
" ° °   °  [address] [employer] [address]             [phone]         6 Days Post-Op Procedures (LRB): EGD (ESOPHAGOGASTRODUODENOSCOPY) (N/A)  Subjective:  Patient states she is so sick.  She is now coughing a lot and overall doesn't feel well.  Objective: Vital signs in last 24 hours: Temp:  [98.5 F (36.9 C)-101.9 F (38.8 C)] 101.3 F (38.5 C) (01/15 0412) Pulse Rate:  [90-105] 105 (01/15 0412) Cardiac Rhythm: Normal sinus rhythm (01/14 1900) Resp:  [17-21] 21 (01/15 0412) BP: (155-196)/(58-80) 179/63 (01/15 0412) SpO2:  [92 %-100 %] 92 % (01/15 0545)  Intake/Output from previous day: 01/14 0701 - 01/15 0700 In: 453.8 [IV Piggyback:453.8] Out: 44 [Chest Tube:44]  General appearance: alert, cooperative, and no distress Heart: regular rate and rhythm and tachy Lungs: diminished breath sounds right base Abdomen: soft, non-tender; bowel sounds normal; no masses,  no organomegaly Extremities: extremities normal, atraumatic, no cyanosis or edema Wound: clean and dry  Lab Results: Recent Labs    08/11/24 0840 08/12/24 0546  WBC 13.8* 11.6*  HGB 10.8* 10.5*  HCT 31.9* 30.2*  PLT 199 182   BMET:  Recent Labs    08/11/24 0840 08/12/24 0546  NA 131* 134*  K 4.4 4.0  CL 98 98  CO2 24 25  GLUCOSE 178* 128*  BUN 22 23  CREATININE 0.96 1.43*  CALCIUM 8.6* 8.5*    PT/INR: No results for input(s): LABPROT, INR in the last 72 hours. ABG    Component Value Date/Time   TCO2 25 11/10/2009 0952   CBG (last 3)  Recent Labs    08/11/24 2355 08/12/24 0538 08/12/24 0636  GLUCAP 112* 124* 130*    Assessment/Plan: S/P Procedures (LRB): EGD (ESOPHAGOGASTRODUODENOSCOPY) (N/A)  CV- Sinus Tachycardia, remains HTN- on IV lopressor , Clonidine , Hydralazine  prn, NTG patch ordered at 0.2 mg Pulm- CT output remains low, right pleural effusion Renal-creatinine increase up to 1.43, will start IV hydration.. likely due to decrease in oral  intake, however need to monitor in setting of fever GI- strict NPO, Esophageal stent migrated into stomach... GI consult to take patient to perform EGD ID- patient now febrile, mild leukocytosis... on Zosyn , Diflucan ... ? Monitor for sepsis..  Dispo- patient now febrile, on Zosyn /Diflucan , creatinine up to 1.43, treat fevers.. GI to perform EGD today.. closely monitor   LOS: 17 days    Rocky Shad, PA-C 08/12/2024 7:40 AM  Patient seen and examined, agree with above Fever to 103- will pan culture, check UA, does not appear toxic. Will get CT chest and abdomen to rule out undrained collection CXR no change from yesterday Gi to reposition stent, timing TBD  Elspeth C. Kerrin, MD [facility] [phone]   "

## 2024-08-12 NOTE — Progress Notes (Signed)
 PHARMACY ANTIBIOTIC CONSULT NOTE   [name] a 69 y.o. female admitted on 12/29 for RML lobectomy in setting of lung adenocarcinoma complicated by an esophageal stent requiring stenting 1/7 (repositioning 1/9).   1/14 CXR showed migration of stent to the stomach, now going for stent repositioning and clipping in place today (1/15) with GI. Also with ongoing fevers (T 103.36F today).  Pharmacy has been consulted for vancomcyin dosing.  AKI - SCr 1.43 today, baseline <1  WBC 13.8>11.6, Tm 103.36F  Estimated Creatinine Clearance: 36.3 mL/min (A) (by C-G formula based on SCr of 1.43 mg/dL (H)).  Plan: Continue Fluconazole  IV per MD START Zosyn  3.375 g IV Q8H (EI)  GIVE Vancomycin  1500 mg IV x1 (Wt used: 70 kg) Then, variable vancomycin  dosing with AKI - additional doses pending SCr,/UOP trend Monitor renal function, clinical status, de-escalation, C/S, levels as indicated   Allergies:  Allergies[1]  Filed Weights   08/08/24 0230 08/09/24 0315 08/10/24 0430  Weight: 71.6 kg (157 lb 13.6 oz) 71.1 kg (156 lb 12 oz) 70 kg (154 lb 5.2 oz)       Latest Ref Rng & Units 08/12/2024    5:46 AM 08/11/2024    8:40 AM 08/07/2024    2:00 AM  CBC  WBC 4.0 - 10.5 K/uL 11.6  13.8  15.4   Hemoglobin 12.0 - 15.0 g/dL 89.4  89.1  88.9   Hematocrit 36.0 - 46.0 % 30.2  31.9  32.6   Platelets 150 - 400 K/uL 182  199  196     Antibiotics Given (last 72 hours)     Date/Time Action Medication Dose Rate   08/09/24 1508 New Bag/Given   piperacillin -tazobactam (ZOSYN ) IVPB 3.375 g 3.375 g 12.5 mL/hr   08/09/24 2028 New Bag/Given   piperacillin -tazobactam (ZOSYN ) IVPB 3.375 g 3.375 g 12.5 mL/hr   08/10/24 0452 New Bag/Given   piperacillin -tazobactam (ZOSYN ) IVPB 3.375 g 3.375 g 12.5 mL/hr   08/10/24 1126 Given   amoxicillin -clavulanate (AUGMENTIN ) 600-42.9 MG/5ML suspension 600 mg 600 mg    08/10/24 2148 Given   amoxicillin -clavulanate (AUGMENTIN ) 600-42.9 MG/5ML suspension 600 mg 600 mg    08/11/24 0854  New Bag/Given   piperacillin -tazobactam (ZOSYN ) IVPB 3.375 g 3.375 g 12.5 mL/hr   08/11/24 1321 New Bag/Given   piperacillin -tazobactam (ZOSYN ) IVPB 3.375 g 3.375 g 12.5 mL/hr   08/11/24 2341 New Bag/Given   piperacillin -tazobactam (ZOSYN ) IVPB 3.375 g 3.375 g 12.5 mL/hr   08/12/24 0533 New Bag/Given   piperacillin -tazobactam (ZOSYN ) IVPB 3.375 g 3.375 g 12.5 mL/hr       Antimicrobials this admission: Zosyn  1/3 > 1/12, 1/14 >  Fluconazole  IV 1/9 > (x1 PO dose 1/13, now strict NPO) Augmentin  x2 doses 1/13 (when PO)  Microbiology results: 1/15 Bcx: sent 1/15 sputum: sent 1/15 UA: sent   Thank you for allowing pharmacy to be a part of this patients care.  Maurilio Fila, PharmD Clinical Pharmacist 08/12/2024  10:28 AM      [1]  Allergies Allergen Reactions   Diltiazem  Hcl Hives    Not sure if she has a problem with this medication.   Penicillins     Tolerated Zosyn  Jan 2026   Simvastatin Other (See Comments)    myalgia   Wellbutrin [Bupropion] Hives

## 2024-08-12 NOTE — Progress Notes (Signed)
" ° ° °  PROCEDURAL EXPEDITER PROGRESS NOTE  Patient Name: [name]  DOB:[birth year] Date of Admission: 07/26/2024  Date of Assessment:08/12/24   -------------------------------------------------------------------------------------------------------------------   Brief clinical summary: Pt to Endo tomorrow for   Orders in place:  Yes   Labs, test, and orders reviewed: Y  Requires surgical clearance:  No  Barriers noted: N/A   -------------------------------------------------------------------------------------------------------------------  [facility], [place], [place] Please contact us  directly via secure chat (search for [facility]) or by calling us  at [phone] [facility]).  "

## 2024-08-12 NOTE — Progress Notes (Signed)
 Nutrition Brief Note  Discussed patient with cardiothoracic surgery PA-C this morning. Hoping for EGD by GI today and assess. No plan to resume TPN today, but will re-assess s/p procedure. If unable to progress, then will likely resume tomorrow. IV hydration ordered as she has had a bump in creatinine. She continues with excessive coughing and is now febrile. Monitoring for sepsis. Will continue to follow along.    INTERVENTION:  Recommend re-initiation of TPN: -Kcal: 1700-1900 kcals -Protein: 100-115g   -Add MVI w/ minerals   Monitor diet advancement/intake and ability to wean nutrition support When diet advanced, add Glucerna shake (chocolate) and Magic Cup to augment intake Patient may bring in Orgain protein powder to be mixed with milk to mimic her home regimen when diet advanced to full liquids     NUTRITION DIAGNOSIS:  Severe Malnutrition related to acute illness as evidenced by moderate muscle depletion, energy intake < or equal to 50% for > or equal to 5 days. - remains applicable   GOAL:  Patient will meet greater than or equal to 90% of their needs - not progressing   MONITOR:  Diet advancement, Supplement acceptance, PO intake, Weight trends  Blair Deaner MS, RD, LDN Registered Dietitian I Clinical Nutrition RD Inpatient Contact Info in Amion

## 2024-08-13 ENCOUNTER — Inpatient Hospital Stay (HOSPITAL_COMMUNITY)

## 2024-08-13 ENCOUNTER — Encounter (HOSPITAL_COMMUNITY): Admission: RE | Payer: Self-pay | Source: Home / Self Care

## 2024-08-13 DIAGNOSIS — I10 Essential (primary) hypertension: Secondary | ICD-10-CM

## 2024-08-13 DIAGNOSIS — K2210 Ulcer of esophagus without bleeding: Secondary | ICD-10-CM | POA: Diagnosis not present

## 2024-08-13 DIAGNOSIS — F418 Other specified anxiety disorders: Secondary | ICD-10-CM | POA: Diagnosis not present

## 2024-08-13 DIAGNOSIS — Z87891 Personal history of nicotine dependence: Secondary | ICD-10-CM

## 2024-08-13 DIAGNOSIS — Z902 Acquired absence of lung [part of]: Secondary | ICD-10-CM | POA: Diagnosis not present

## 2024-08-13 HISTORY — PX: HEMOSTASIS CLIP PLACEMENT: SHX6857

## 2024-08-13 HISTORY — PX: ESOPHAGEAL STENT PLACEMENT: SHX5540

## 2024-08-13 HISTORY — PX: FLEXIBLE BRONCHOSCOPY: SHX5094

## 2024-08-13 HISTORY — PX: ESOPHAGOGASTRODUODENOSCOPY: SHX5428

## 2024-08-13 HISTORY — PX: STENT REMOVAL: SHX6421

## 2024-08-13 HISTORY — PX: BRONCHIAL WASHINGS: SHX5105

## 2024-08-13 LAB — CBC
HCT: 27.1 % — ABNORMAL LOW (ref 36.0–46.0)
Hemoglobin: 9.3 g/dL — ABNORMAL LOW (ref 12.0–15.0)
MCH: 31.8 pg (ref 26.0–34.0)
MCHC: 34.3 g/dL (ref 30.0–36.0)
MCV: 92.8 fL (ref 80.0–100.0)
Platelets: 156 K/uL (ref 150–400)
RBC: 2.92 MIL/uL — ABNORMAL LOW (ref 3.87–5.11)
RDW: 14.6 % (ref 11.5–15.5)
WBC: 11.1 K/uL — ABNORMAL HIGH (ref 4.0–10.5)
nRBC: 0 % (ref 0.0–0.2)

## 2024-08-13 LAB — BASIC METABOLIC PANEL WITH GFR
Anion gap: 12 (ref 5–15)
BUN: 30 mg/dL — ABNORMAL HIGH (ref 8–23)
CO2: 21 mmol/L — ABNORMAL LOW (ref 22–32)
Calcium: 8.1 mg/dL — ABNORMAL LOW (ref 8.9–10.3)
Chloride: 105 mmol/L (ref 98–111)
Creatinine, Ser: 2.01 mg/dL — ABNORMAL HIGH (ref 0.44–1.00)
GFR, Estimated: 27 mL/min — ABNORMAL LOW
Glucose, Bld: 93 mg/dL (ref 70–99)
Potassium: 3.9 mmol/L (ref 3.5–5.1)
Sodium: 137 mmol/L (ref 135–145)

## 2024-08-13 LAB — GLUCOSE, CAPILLARY
Glucose-Capillary: 116 mg/dL — ABNORMAL HIGH (ref 70–99)
Glucose-Capillary: 83 mg/dL (ref 70–99)
Glucose-Capillary: 84 mg/dL (ref 70–99)
Glucose-Capillary: 85 mg/dL (ref 70–99)
Glucose-Capillary: 89 mg/dL (ref 70–99)

## 2024-08-13 MED ORDER — DEXAMETHASONE SOD PHOSPHATE PF 10 MG/ML IJ SOLN
INTRAMUSCULAR | Status: DC | PRN
  Administered 2024-08-13: 10 mg via INTRAVENOUS

## 2024-08-13 MED ORDER — LIDOCAINE 2% (20 MG/ML) 5 ML SYRINGE
INTRAMUSCULAR | Status: DC | PRN
  Administered 2024-08-13: 60 mg via INTRAVENOUS

## 2024-08-13 MED ORDER — ONDANSETRON HCL 4 MG/2ML IJ SOLN
INTRAMUSCULAR | Status: DC | PRN
  Administered 2024-08-13: 4 mg via INTRAVENOUS

## 2024-08-13 MED ORDER — PROPOFOL 10 MG/ML IV BOLUS
INTRAVENOUS | Status: DC | PRN
  Administered 2024-08-13: 125 ug/kg/min via INTRAVENOUS
  Administered 2024-08-13: 150 mg via INTRAVENOUS

## 2024-08-13 MED ORDER — PHENYLEPHRINE 80 MCG/ML (10ML) SYRINGE FOR IV PUSH (FOR BLOOD PRESSURE SUPPORT)
PREFILLED_SYRINGE | INTRAVENOUS | Status: DC | PRN
  Administered 2024-08-13 (×2): 160 ug via INTRAVENOUS

## 2024-08-13 MED ORDER — FENTANYL CITRATE (PF) 100 MCG/2ML IJ SOLN
INTRAMUSCULAR | Status: AC
  Filled 2024-08-13: qty 2

## 2024-08-13 MED ORDER — OXYCODONE HCL 5 MG PO TABS: 5.0000 mg | ORAL_TABLET | Freq: Once | ORAL | Status: DC | PRN

## 2024-08-13 MED ORDER — FENTANYL CITRATE (PF) 250 MCG/5ML IJ SOLN
INTRAMUSCULAR | Status: DC | PRN
  Administered 2024-08-13 (×2): 50 ug via INTRAVENOUS

## 2024-08-13 MED ORDER — OXYCODONE HCL 5 MG/5ML PO SOLN: 5.0000 mg | Freq: Once | ORAL | Status: DC | PRN

## 2024-08-13 MED ORDER — PHENYLEPHRINE HCL-NACL 20-0.9 MG/250ML-% IV SOLN
INTRAVENOUS | Status: DC | PRN
  Administered 2024-08-13: 20 ug/min via INTRAVENOUS

## 2024-08-13 MED ORDER — SODIUM CHLORIDE 0.9 % IV SOLN: INTRAVENOUS | Status: AC

## 2024-08-13 MED ORDER — FENTANYL CITRATE (PF) 100 MCG/2ML IJ SOLN: 25.0000 ug | INTRAMUSCULAR | Status: DC | PRN

## 2024-08-13 MED ORDER — ONDANSETRON HCL 4 MG/2ML IJ SOLN: 4.0000 mg | Freq: Once | INTRAMUSCULAR | Status: DC | PRN

## 2024-08-13 MED ORDER — SUGAMMADEX SODIUM 200 MG/2ML IV SOLN
INTRAVENOUS | Status: DC | PRN
  Administered 2024-08-13: 200 mg via INTRAVENOUS

## 2024-08-13 MED ORDER — ROCURONIUM BROMIDE 10 MG/ML (PF) SYRINGE
PREFILLED_SYRINGE | INTRAVENOUS | Status: DC | PRN
  Administered 2024-08-13: 20 mg via INTRAVENOUS
  Administered 2024-08-13: 50 mg via INTRAVENOUS

## 2024-08-13 NOTE — Consult Note (Signed)
 [name] Admit Date: 07/26/2024 08/14/2024 [name] Requesting Physician:  Kerrin MD  Reason for Consult:  AKI HPI:  81F with history of lung cancer status post right middle lobe lobectomy 07/26/2024 with resection of esophageal diverticulum and complication including an esophageal leak requiring esophageal stenting that has migrated into the stomach.  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    Has been with little if any by mouth, intermittently on TPN and also getting maintenance IV fluids.   PMH Incudes: Hypertension, most home medications have been held given prolonged NPO DM2 History of latent TB treated decades ago  Trend in serum creatinine is as below.  Normal GFR at baseline.  CT chest abdomen and pelvis performed 08/12/2024 with no hydronephrosis bilaterally punctate nonobstructing left kidney stones noted.  Bladder and visible ureters appeared unremarkable.  Urine analysis 08/12/2024 had 21-50 RBCs per high-powered field.  She can use to have tea colored urine which is markedly different than her baseline.  No contrast exposure.  Has had a single exposure to ketorolac .  Has been exposed to beta-lactam antibiotics and vancomycin .  No significant hypertension.  No Bactrim.   Creatinine, Ser (mg/dL)  Date Value  98/82/7973 2.34 (H)  08/13/2024 2.01 (H)  08/12/2024 1.54 (H)  08/12/2024 1.43 (H)  08/11/2024 0.96  08/09/2024 0.78  08/08/2024 0.85  08/07/2024 0.79  08/05/2024 0.76  08/04/2024 0.80  ] I/Os: I/O last 3 completed shifts: In: 3494.4 [I.V.:2381.6; IV Piggyback:1112.8] Out: 1185 [Urine:1175; Chest Tube:10]   ROS Balance of 12 systems is negative w/ exceptions as above  PMH  Past Medical History:  Diagnosis Date   Anxiety    Depression    Diabetes mellitus without complication (HCC)    Dysrhythmia    PVCs, SVT   Heart murmur    1981; when pt was pregnant    Hyperlipidemia    Hypertension    Sleep apnea    Tachycardia    Tuberculosis 1980   was treated; latent   PSH  Past Surgical History:  Procedure Laterality Date   APPENDECTOMY     CESAREAN SECTION     ENDOBRONCHIAL ULTRASOUND Bilateral 06/03/2024   Procedure: ENDOBRONCHIAL ULTRASOUND (EBUS);  Surgeon: Catherine Cools, MD;  Location: [facility] ENDOSCOPY;  Service: Pulmonary;  Laterality: Bilateral;   ESOPHAGOGASTRODUODENOSCOPY N/A 08/04/2024   Procedure: EGD (ESOPHAGOGASTRODUODENOSCOPY) With X  WALLFLEX ESOPHAGEAL STENT PLACEMENT;  Surgeon: Shyrl Linnie KIDD, MD;  Location: [facility] OR;  Service: Thoracic;  Laterality: N/A;   ESOPHAGOGASTRODUODENOSCOPY N/A 08/06/2024   Procedure: EGD (ESOPHAGOGASTRODUODENOSCOPY);  Surgeon: Daniel Con RAMAN, MD;  Location[facility] OR;  Service: Thoracic;  Laterality: N/A;   LOBECTOMY, LUNG, ROBOT-ASSISTED, USING VATS Right 07/26/2024   Procedure: ROBOT ASSISTED RIGHT MIDDLE LOBECTOMY; RESECTION OF ESOPHAGEAL DIVERTICULUM;  Surgeon: Kerrin Elspeth BROCKS, MD;  Location: [facility] OR;  Service: Thoracic;  Laterality: Right;  ROBOTIC RIGHT MIDDLE LOBECTOMY   LYMPH NODE BIOPSY  07/26/2024   Procedure: LYMPH NODE BIOPSY;  Surgeon: Kerrin Elspeth BROCKS, MD;  Location: [facility] OR;  Service: Thoracic;;   TONSILLECTOMY  1974   VIDEO BRONCHOSCOPY WITH ENDOBRONCHIAL NAVIGATION Right 06/03/2024   Procedure: VIDEO BRONCHOSCOPY WITH ENDOBRONCHIAL NAVIGATION;  Surgeon: Catherine Cools, MD;  Location: [facility] ENDOSCOPY;  Service: Pulmonary;  Laterality: Right;   FH  Family History  Problem Relation Age of Onset   Heart attack Mother    Coronary artery disease Father        bypass surgery  Heart block Brother    Breast cancer Paternal Aunt        @ unknown age   BRCA 1/2 Neg Hx    SH  reports that she quit smoking about 2 months ago. Her smoking use included cigarettes. She started smoking about 4 years ago. She has a 1.9 pack-year smoking history. She has never used smokeless tobacco. She  reports that she does not currently use alcohol. No history on file for drug use. Allergies Allergies[1] Home medications Prior to Admission medications  Medication Sig Start Date End Date Taking? Authorizing Provider  acetaminophen  (TYLENOL ) 500 MG tablet Take 1,000 mg by mouth every 6 (six) hours as needed for mild pain (pain score 1-3) or moderate pain (pain score 4-6).   Yes [provider]  ALPRAZolam  (XANAX ) 1 MG tablet Take 1 mg by mouth 3 (three) times daily as needed for sleep or anxiety. 02/11/22  Yes [provider]  aspirin  EC 81 MG tablet Take 81 mg by mouth daily. Swallow whole.   Yes [provider]  calcium carbonate (TUMS - DOSED IN MG ELEMENTAL CALCIUM) 500 MG chewable tablet Chew 1 tablet by mouth daily as needed for indigestion or heartburn.   Yes [provider]  HYDROcodone-acetaminophen  (NORCO) 10-325 MG tablet Take 1 tablet by mouth every 6 (six) hours as needed for moderate pain (pain score 4-6) or severe pain (pain score 7-10).   Yes [provider]  ibuprofen (ADVIL) 200 MG tablet Take 400-800 mg by mouth every 6 (six) hours as needed for mild pain (pain score 1-3) or moderate pain (pain score 4-6).   Yes [provider]  irbesartan  (AVAPRO ) 150 MG tablet Take 150 mg by mouth daily. 05/12/23  Yes [provider]  JARDIANCE 25 MG TABS tablet Take 25 mg by mouth every morning. 06/30/24  Yes [provider]  levalbuterol  (XOPENEX  HFA) 45 MCG/ACT inhaler Inhale 1 puff into the lungs every 8 (eight) hours as needed for wheezing or shortness of breath. 06/23/18  Yes [provider]  metaxalone (SKELAXIN) 800 MG tablet Take 800 mg by mouth 3 (three) times daily as needed for muscle spasms. 03/03/22  Yes [provider]  metoprolol  succinate (TOPROL -XL) 50 MG 24 hr tablet 1 TABLET ORALLY ONCE A DAY 90 DAYS 07/10/22  Yes Riddle, Suzann, NP  nicotine  (NICODERM CQ  - DOSED IN MG/24 HOURS) 21 mg/24hr  patch Place 10.5 mg onto the skin daily. Cuts the patch   Yes [provider]  pravastatin  (PRAVACHOL ) 40 MG tablet Take 1 tablet by mouth daily.   Yes [provider]  propranolol (INDERAL) 10 MG tablet Take 10-20 mg by mouth daily as needed (tachacardia). 09/11/15  Yes [provider]  tiZANidine (ZANAFLEX) 2 MG tablet Take 2 mg by mouth every 8 (eight) hours as needed for muscle spasms. 05/27/24  Yes [provider]  traZODone  (DESYREL ) 100 MG tablet Take 200 mg by mouth at bedtime. 02/13/22  Yes [provider]    Current Medications Scheduled Meds:  Chlorhexidine  Gluconate Cloth  6 each Topical Daily   cloNIDine   0.3 mg Transdermal Weekly   enoxaparin  (LOVENOX ) injection  40 mg Subcutaneous Daily   fentaNYL   1 patch Transdermal Q72H   insulin  aspart  0-15 Units Subcutaneous Q6H   levalbuterol   0.63 mg Nebulization Q8H   metoprolol  tartrate  15 mg Intravenous Q6H   neomycin -bacitracin -polymyxin   Topical BID   nicotine   7 mg Transdermal Daily   nitroGLYCERIN   0.2 mg Transdermal Daily   pantoprazole  (PROTONIX ) IV  40 mg Intravenous Q24H   sodium chloride  flush  10-40 mL Intracatheter Q12H   vancomycin  variable dose per unstable renal function (pharmacist dosing)   Does not apply See admin instructions   Continuous Infusions:  sodium chloride  100 mL/hr at 08/14/24 0325   acetaminophen      Followed by   NOREEN ON 08/15/2024] acetaminophen      fluconazole  (DIFLUCAN ) IV 400 mg (08/13/24 0853)   piperacillin -tazobactam (ZOSYN )  IV 3.375 g (08/14/24 0545)   promethazine  (PHENERGAN ) injection (IM or IVPB) Stopped (08/07/24 1907)   vancomycin      PRN Meds:.bisacodyl , diphenhydrAMINE , hydrALAZINE , HYDROmorphone  (DILAUDID ) injection, levalbuterol , lidocaine -prilocaine , lip balm, LORazepam , ondansetron  (ZOFRAN ) IV, mouth rinse, phenol, promethazine  (PHENERGAN ) injection (IM or IVPB), sodium chloride  flush  CBC Recent Labs  Lab 08/12/24 0546  08/13/24 0500 08/14/24 0500  WBC 11.6* 11.1* 10.5  HGB 10.5* 9.3* 10.6*  HCT 30.2* 27.1* 31.4*  MCV 91.5 92.8 92.9  PLT 182 156 199   Basic Metabolic Panel Recent Labs  Lab 08/08/24 0743 08/09/24 0350 08/11/24 0840 08/12/24 0546 08/12/24 1500 08/13/24 1000 08/14/24 0500  NA 138 137 131* 134* 135 137 140  K 4.3 4.1 4.4 4.0 3.5 3.9 4.0  CL 105 104 98 98 105 105 107  CO2 25 25 24 25 22  21* 18*  GLUCOSE 101* 132* 178* 128* 96 93 142*  BUN 28* 26* 22 23 24* 30* 39*  CREATININE 0.85 0.78 0.96 1.43* 1.54* 2.01* 2.34*  CALCIUM 8.7* 8.4* 8.6* 8.5* 7.0* 8.1* 8.0*  PHOS 2.7 3.1  --   --   --   --   --     Physical Exam  Blood pressure (!) 180/67, pulse 82, temperature 98 F (36.7 C), temperature source Oral, resp. rate 19, height 5' 3 (1.6 m), weight 70 kg, last menstrual period 10/26/1998, SpO2 95%. GEN: NAD, lying in bed, resting ENT: NCAT EYES: EOMI CV: Regular, normal S1 and S2 PULM: Clear bilaterally, normal work of breathing ABD: Soft, nontender SKIN: No rashes, no petechia/purpura EXT: No peripheral edema  Assessment 23F with AKI and persistent gross hematuria after right middle lobe lobectomy for malignancy with esophageal diverticulum status post resection complicated by leak requiring esophageal stenting with migration.  Nonoliguric AKI with persistently hematuric urine: Potential etiologies include ATN from volume shifts, potentially antibiotic exposures including vancomycin  and beta-lactam's.  Unsure what to make of gross hematuria.  Recent imaging with small stones but unlikely to be the cause of this, no other findings at that time.  It would be unusual to have an active GN at this point; perhaps infection related  With her current surgical infectious issues, not something that could be treated with immunosuppression even if confirmed.    Plan Consider transitioning off of vancomycin  given no nephrotoxicity No further Toradol  Send off serologies, complement levels  at the current time Additional renal imaging would be useful but not worth contrast exposure currently If does not resolve and workup is unrevealing we will need to consider renal biopsy. Daily weights, Daily Renal Panel, Strict I/Os, Avoid nephrotoxins (NSAIDs, judicious IV Contrast) Will follow along   [name]  [phone] pgr 08/14/2024, 6:55 AM         [1]  Allergies Allergen Reactions   Diltiazem  Hcl Hives    Not sure if she has a problem with this medication.   Penicillins     Tolerated Zosyn  Jan 2026   Simvastatin Other (See Comments)  myalgia   Wellbutrin [Bupropion] Hives

## 2024-08-13 NOTE — Interval H&P Note (Signed)
 History and Physical Interval Note:  08/13/2024 1:59 PM  [name]  has presented today for surgery, with the diagnosis of Esophageal perforation.  The various methods of treatment have been discussed with the patient and family. After consideration of risks, benefits and other options for treatment, the patient has consented to  Procedures with comments: EGD (ESOPHAGOGASTRODUODENOSCOPY) (N/A) - needs stent removal with placement of another esophageal stent BRONCHOSCOPY, FLEXIBLE as a surgical intervention.  The patient's history has been reviewed, patient examined, no change in status, stable for surgery.  I have reviewed the patient's chart and labs.  Questions were answered to the patient's satisfaction.     Yago Ludvigsen D

## 2024-08-13 NOTE — Progress Notes (Signed)
 BMET today shows creatinine up to 2.01. She was on NS at 100 ml/hr earlier but order expired. As discussed with Dr. Kerrin, IVF restarted and nephrology consulted. Will avoid nephrotoxic agents.

## 2024-08-13 NOTE — Interval H&P Note (Signed)
 History and Physical Interval Note:  08/13/2024 1:41 PM  [name]  has presented today for surgery, with the diagnosis of Esophageal perforation.  The various methods of treatment have been discussed with the patient and family. After consideration of risks, benefits and other options for treatment, the patient has consented to  Procedures with comments: EGD (ESOPHAGOGASTRODUODENOSCOPY) (N/A) - needs stent removal with placement of another esophageal stent BRONCHOSCOPY, FLEXIBLE as a surgical intervention.  The patient's history has been reviewed, patient examined, no change in status, stable for surgery.  I have reviewed the patient's chart and labs.  Questions were answered to the patient's satisfaction.     [name]

## 2024-08-13 NOTE — Plan of Care (Signed)
" °  Problem: Respiratory: Goal: Will regain and/or maintain adequate ventilation Outcome: Progressing Goal: Respiratory status will improve Outcome: Progressing   Problem: Skin Integrity: Goal: Demonstrates signs of wound healing without infection Outcome: Progressing   Problem: Activity: Goal: Risk for activity intolerance will decrease Outcome: Progressing   Problem: Nutrition: Goal: Adequate nutrition will be maintained Outcome: Progressing   Problem: Coping: Goal: Level of anxiety will decrease Outcome: Progressing   Problem: Elimination: Goal: Will not experience complications related to bowel motility Outcome: Progressing Goal: Will not experience complications related to urinary retention Outcome: Progressing   "

## 2024-08-13 NOTE — Anesthesia Preprocedure Evaluation (Addendum)
"                                    Anesthesia Evaluation  Patient identified by MRN, date of birth, ID band Patient awake    Reviewed: Allergy & Precautions, NPO status , Patient's Chart, lab work & pertinent test results, reviewed documented beta blocker date and time   History of Anesthesia Complications Negative for: history of anesthetic complications  Airway Mallampati: II  TM Distance: >3 FB Neck ROM: Full    Dental  (+) Dental Advisory Given, Chipped, Poor Dentition   Pulmonary sleep apnea , former smoker  Remote hx TB Hx lung cancer s/p lobectomy    Pulmonary exam normal        Cardiovascular hypertension, Pt. on medications and Pt. on home beta blockers Normal cardiovascular exam+ dysrhythmias Supra Ventricular Tachycardia      Neuro/Psych  PSYCHIATRIC DISORDERS Anxiety Depression    negative neurological ROS     GI/Hepatic Neg liver ROS,,, Esophageal perforation    Endo/Other  diabetes, Type 2, Oral Hypoglycemic Agents    Renal/GU Renal InsufficiencyRenal disease     Musculoskeletal negative musculoskeletal ROS (+)    Abdominal   Peds  Hematology  (+) Blood dyscrasia, anemia   Anesthesia Other Findings   Reproductive/Obstetrics                              Anesthesia Physical Anesthesia Plan  ASA: 3  Anesthesia Plan: General   Post-op Pain Management: Minimal or no pain anticipated   Induction: Intravenous  PONV Risk Score and Plan: 3 and Treatment may vary due to age or medical condition, Ondansetron  and Dexamethasone   Airway Management Planned: Oral ETT  Additional Equipment: None  Intra-op Plan:   Post-operative Plan: Extubation in OR  Informed Consent: I have reviewed the patients History and Physical, chart, labs and discussed the procedure including the risks, benefits and alternatives for the proposed anesthesia with the patient or authorized representative who has indicated his/her  understanding and acceptance.     Dental advisory given  Plan Discussed with: CRNA and Anesthesiologist  Anesthesia Plan Comments:          Anesthesia Quick Evaluation  "

## 2024-08-13 NOTE — Op Note (Signed)
 [facility] Patient Name: [name] Procedure Date : 08/13/2024 MRN: [id number] Attending MD: Belvie Just , MD, [id number] Date of Birth: 12-Jun-1956 CSN: [id number] Age: 69 Admit Type: Inpatient Procedure:                Upper GI endoscopy Indications:              Therapeutic procedure Providers:                Belvie Just, MD, Hoy Penner, RN, Lorrayne Kitty,                            Technician Referring MD:              Medicines:                General Anesthesia Complications:            No immediate complications. Estimated Blood Loss:     Estimated blood loss: none. Procedure:                Pre-Anesthesia Assessment:                           - Prior to the procedure, a History and Physical                            was performed, and patient medications and                            allergies were reviewed. The patient's tolerance of                            previous anesthesia was also reviewed. The risks                            and benefits of the procedure and the sedation                            options and risks were discussed with the patient.                            All questions were answered, and informed consent                            was obtained. Prior Anticoagulants: The patient has                            taken no anticoagulant or antiplatelet agents. ASA                            Grade Assessment: III - A patient with severe                            systemic disease. After reviewing the risks and  benefits, the patient was deemed in satisfactory                            condition to undergo the procedure.                           - Sedation was administered by an anesthesia                            professional. General anesthesia was attained.                           After obtaining informed consent, the endoscope was                            passed under direct vision. Throughout  the                            procedure, the patient's blood pressure, pulse, and                            oxygen saturations were monitored continuously. The                            GIF-H190 ([id number]) Olympus endoscope was introduced                            through the mouth, and advanced to the second part                            of duodenum. The [id number]-[id number] ( [id number] ) EGD                            Therapeutic Scope was introduced through the and                            advanced to the. The upper GI endoscopy was                            technically difficult and complex. The patient                            tolerated the procedure well. Scope In: Scope Out: Findings:      One cratered esophageal ulcer was found 30 cm from the incisors. The       lesion was 20 mm in largest dimension. This was stented with a 23 mm x       12.5 cm WallFlex covered stent under fluoroscopic guidance.      A metal stent was found in the gastric body. Stent removal was       accomplished with a rat-toothed forceps.      The examined duodenum was normal.      At 30 cm a large 2 cm post surgical scar was noted. There was no       evidence of any bleeding.  Distal to this area surgical staples were       identified. There was some evidence of an esophagitis at the GE       junction. In the gastric lumen there was evidence of the migrated stent.       Removal of the stent was initially attempted with pulling the purse       string, but this failed to collapse the proximal end of the stent.       Multiple attempts were made to remove the stent with a flexible snare,       stiff snare and the Rat-toothed forceps. Capturing the stent with the       snare failed to collapse the stent. It was not able to be pulled safely       through the GE junction. Using a dual channel upper endoscope a two       forcep technique was tried with grabbing the purse string at opposing       positions of the  stent. However, it was apparent that only a single       Rat-toothed forcep was needed. The larger working channel allowed for       the purse string to be pulled into the scope, which then allowed for the       purse string to collapse the proximal end. At this point the stent was       easily removed. An other 23 mm x 125 mm covered metallic stent was       placed. With fluoroscopy the ulcerated mucosa was marked with an       external marker correlating with the top of the endoscope. A guidewire       was deployed and lodged in the gastric lumen. Over the guidewire, the       new stent was easily advanced and deployed under fluoroscopy.       Adjustments were made to place the middle of the stent across the       diverticulotomy ulcer. A repeat endoscope examination was performed and       it showed that the distal end of the stent was in line with the GE       junction. The stent was adjusted proximally to allow for the GE junction       to close. Remeasurement showed that the distal end of the stent was 3 cm       above the GE junction. Even with the more proximal location of the       stent, the diverticulotomy site was occludded with the stent. This was       visually confirmed and conferred with Dr. Kerrin. Because of two       prior stent migrations, three hemoclips were placed in the proximal       portion of the stent to reduce the risk of migration. It was also clear       to note that the esophagus was hypermotile to the point that the       contractions prevented the movement of the endoscope. Fluoroscopic       images were obtained to confirm placement and the procedure was       concluded. Impression:               - Esophageal ulcer. Prosthesis placed.                           -  Pre-existing gastric stent, removed.                           - Normal examined duodenum. Recommendation:           - Return patient to hospital ward for ongoing care.                            - Clear liquid diet.                           - Continue present medications. Procedure Code(s):        --- Professional ---                           [phone], Esophagogastroduodenoscopy, flexible,                            transoral; with placement of endoscopic stent                            (includes pre- and post-dilation and guide wire                            passage, when performed)                           43247, Esophagogastroduodenoscopy, flexible,                            transoral; with removal of foreign body(s)                           74360, Intraluminal dilation of strictures and/or                            obstructions (eg, esophagus), radiological                            supervision and interpretation Diagnosis Code(s):        --- Professional ---                           K22.10, Ulcer of esophagus without bleeding                           Z97.8, Presence of other specified devices CPT copyright 2022 [employer]. All rights reserved. The codes documented in this report are preliminary and upon coder review may  be revised to meet current compliance requirements. Belvie Just, MD Belvie Just, MD 08/13/2024 4:20:11 PM This report has been signed electronically. Number of Addenda: 0

## 2024-08-13 NOTE — Consult Note (Signed)
 "  NAME:  [name], MRN:  [id number], DOB:  03-06-1956, LOS: 18 ADMISSION DATE:  07/26/2024, CONSULTATION DATE:  1/15 REFERRING MD:  Kerrin, CHIEF COMPLAINT:  sepsis   History of Present Illness:  Ms. [name] is a 69 y/o woman with a history of tobacco abuse and lung adenocarcinoma status post RML lobectomy on 12/29.  At the time of surgery was a large cyst that was resected. Despite oversewing it time resection she later developed an esophageal leak.  She continues to with her R chest tube in since surgery.  She required and esophageal stent, which has unfortunately migrated to stomach.  Plans have been for EGD for repositioning and clipping in place today with GI. Tmax 103.2 this morning.  She reports after going back to eating and taking her oral medication she developed increased sputum production for 4.  On clear liquid diet she had  improved sputum production. She has had urinary symptoms since her foley at the time of surgery; no recent foley catheter. PCCM consulted for sepsis.  Pertinent  Medical History  Lung adenocarcinoma Latent  TB, treated in 1980 HTN HLD DM OSA Tobacco abuse  Significant Hospital Events: Including procedures, antibiotic start and stop dates in addition to other pertinent events   12/29 RML lobectomy 1/7 EGD with esophageal stent 1/9 EGD  with stent reposition after migration to stomach 1/15 vanc added, zosyn  restarted. CT   Interim History / Subjective:  Feeling improved today. IVF have helped her dry mouth. Still coughing up sputum. Planning for bronch for airway inspection & EGD today.  Objective    Blood pressure (!) 152/59, pulse 78, temperature 98.6 F (37 C), temperature source Oral, resp. rate 20, height 5' 3 (1.6 m), weight 70 kg, last menstrual period 10/26/1998, SpO2 94%.        Intake/Output Summary (Last 24 hours) at 08/13/2024 0713 Last data filed at 08/13/2024 0346 Gross per 24 hour  Intake 2541.05 ml  Output 775 ml  Net  1766.05 ml   Filed Weights   08/08/24 0230 08/09/24 0315 08/10/24 0430  Weight: 71.6 kg 71.1 kg 70 kg    Examination: General: chronically ill appearing woman lying in bed in NAD HENT: [name]/AT, eyes anicteric Lungs: Breathing comfortably lying flat, no rhonchi. Minimal rhales. Cardiovascular: S1S2, RRR  Abdomen: soft, NT Extremities: no peripheral edema Neuro: awake, alert, moving all extremities   BUN 24 Cr 1.54 WBC 11.1, downtrending UA 6-10 WBC  CT personally reviewed> possible BE fistula vs redundant esophagus. Post op small hydropneumothorax with chest tube in place.  Resolved problem list   Assessment and Plan   Fevers, at risk for developing sepsis -blood, sputum cultures pending -con't vanc, zosyn , fluconazole  -agree with bronch for airway inspection  Possible BE fistula -airway inspection today, NPO -esophageal stent to be replaced today  AKI -responding well to fluids clinically -BMP ordered  Hyponatremia, mild, improving -monitor  Lung adenocarcinoma, s/p lobectomy -post op care per TCTS -chest tube per TCTS -pain control as ordered- fentanyl  patch, dilaudid ,   H/o tobacco abuse -recommend cessation -con't nicotine  patch  Hyperglycemia -SSI PRN> not needing -goal BG 140-180     Labs   CBC: Recent Labs  Lab 08/07/24 0200 08/11/24 0840 08/12/24 0546 08/13/24 0500  WBC 15.4* 13.8* 11.6* 11.1*  HGB 11.0* 10.8* 10.5* 9.3*  HCT 32.6* 31.9* 30.2* 27.1*  MCV 93.1 93.0 91.5 92.8  PLT 196 199 182 156    Basic Metabolic Panel: Recent Labs  Lab 08/08/24 0743  08/09/24 0350 08/11/24 0840 08/12/24 0546 08/12/24 1500  NA 138 137 131* 134* 135  K 4.3 4.1 4.4 4.0 3.5  CL 105 104 98 98 105  CO2 25 25 24 25 22   GLUCOSE 101* 132* 178* 128* 96  BUN 28* 26* 22 23 24*  CREATININE 0.85 0.78 0.96 1.43* 1.54*  CALCIUM 8.7* 8.4* 8.6* 8.5* 7.0*  MG  --  2.0  --   --   --   PHOS 2.7 3.1  --   --   --    GFR: Estimated Creatinine Clearance:  33.7 mL/min (A) (by C-G formula based on SCr of 1.54 mg/dL (H)). Recent Labs  Lab 08/07/24 0200 08/11/24 0840 08/12/24 0546 08/13/24 0500  WBC 15.4* 13.8* 11.6* 11.1*     Critical care time:       Kelly SHAUNNA Gaskins, DO 08/13/24 7:56 AM  Pulmonary & Critical Care  For contact information, see Amion. If no response to pager, please call PCCM 2H APP. After hours, 7PM- 7AM, please call on call APP for 2H.         "

## 2024-08-13 NOTE — Op Note (Signed)
 Bronchoscopy Procedure Note  Date of Operation: 08/13/2024  Pre-op Diagnosis: s/p lobectomy  Post-op Diagnosis: same  Surgeon: [name]  Assistants: none  Anesthesia: General endotracheal anesthesia  Operation: Flexible fiberoptic bronchoscopy, bronchoalveolar lavage  Findings: Small BPF at bronchus intermedius, RML stump well healed  Specimen: BAL for culture  Estimated Blood Loss: Minimal  Drains: none  Complications: none  Indications and History: [name] is a 69 year-old woman who underwent right middle lobectomy and resection of an esophageal diverticulum recently complicated by esophageal leak.  Ct shows possible fistula of bronchus intermedius.  She is undergoing esophageal stent replacement by Dr. Rollin and advised to have bronchoscopy at the same setting.  The risks, benefits, complications, treatment options and expected outcomes were discussed with the patient.  The possibilities of reaction to medication, pulmonary aspiration, perforation of a viscus, bleeding, failure to diagnose a condition and creating a complication requiring transfusion or operation were discussed with the patient who freely signed the consent.    Description of Procedure: The patient was seen in the Holding Room and the site of surgery properly noted/marked.  The patient was taken to the endoscopy suite, identified as [name] and the procedure verified as Flexible Fiberoptic Bronchoscopy.  A Time Out was held and the above information confirmed.   She had induction of general anesthesia and was intubated.  She had stent removal and replacement by Dr. Rollin.    Flexible fiberoptic bronchoscopy was performed via the endotracheal tube.  There were moderate white secretions.  There was a small hole in the bronchus intermedius with secretions in the area.  The right middle lobe stump was intact.  Bronchoalveolar lavage was performed. The bronchoscope was removed.  The Patient was  taken to the Endoscopy Recovery area in satisfactory condition.  Attestation: I performed the procedure.  [name]

## 2024-08-13 NOTE — Progress Notes (Signed)
 Nutrition Follow-up  DOCUMENTATION CODES:   Severe malnutrition in context of acute illness/injury  INTERVENTION:  Recommend re-initiation of TPN: -Kcal: 1700-1900 kcals -Protein: 100-115g   -Add MVI w/ minerals   Monitor diet advancement/tolerance  When diet advanced:  Add Glucerna shake (chocolate) and Magic Cup to augment intake Patient may bring in Orgain protein powder to be mixed with milk to mimic her home regimen when diet advanced to full liquids Add MVI w/ minerals Monitor bowels and need for bowel regimen as no BM x3 days noted   NUTRITION DIAGNOSIS:  Severe Malnutrition related to acute illness as evidenced by moderate muscle depletion, energy intake < or equal to 50% for > or equal to 5 days.  GOAL:  Patient will meet greater than or equal to 90% of their needs  MONITOR:  Diet advancement, Supplement acceptance, PO intake, Weight trends  REASON FOR ASSESSMENT:   Consult New TPN/TNA  ASSESSMENT:   Pt with PMH significant for: T2DM, HLD, HTN, sleep apnea, anxiety and depression. 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 Now s/p right middle lobectomy and resection of esophageal diverticulum.  12/29 admitted; OR: middle lobectomy and resection of esophageal diverticulum 12/30 esophagram: small contained esophageal leak 12/31 PICC placed; TPN started 1/06 aspirated contrast - esophagram unable to be completed 1/07 esophageal stent placed; TPN at goal 1/08 diet adv CLD  1/09 reports of excessive coughing; esophageal stent found to be in wrong position 1/10 adjustment to esophageal stent 1/12 repeat esophagram; diet advanced to clear liquid diet 1/13 - diet advanced DYS1/thins - increased coughing 1/14 - NPO; esophageal stent migration 1/15 - CT: displaced esophageal stent, small R pleural effusion 1/16 - EGD w/ stent replacement, bronch  Patient esophageal stent has migrated again. NPO since since morning on  08/11/2024. Discussed again with cardiothoracic surgery PA-C, who states will not restart TPN today given scheduled for OR this afternoon. Likely plan to re-assess ability to advance oral diet again with esophagram tomorrow. If unable to advance diet, will likely resume TPN. Continue to recommend nutrition support given malnutrition dx and her continued elevated needs.   Admit Weight: 75.2 kg (first measured) Current Weight: 70 kg   Has trended back down toward reported UBW. Last documented bowel movement 08/10/2024. Monitor need for bowel regimen. No significant edema noted. Only PRN dulcolax suppository ordered.   Drains/Lines: R basilic: PICC, double lumen (placed 12/31) R pleural: chest tube (28Fr): no output noted   Electrolytes stable. Receiving IVFs. WBC trending down.   Meds: SS Novolog  (not requiring), IV ABX PRN: dulcolax suppository    Labs:  Na+ 137 (wdl) K+ 3.9 (wdl) WBC 11.1 (H) CBGs 89-96 x24 hours A1c 5.9 (06/2024)   Diet Order:   Diet Order             Diet NPO time specified Except for: Sips with Meds  Diet effective midnight                  EDUCATION NEEDS:   Education needs have been addressed  Skin:  Skin Assessment: Reviewed RN Assessment  Last BM:  1/13  Height:  Ht Readings from Last 1 Encounters:  08/04/24 5' 3 (1.6 m)   Weight:  Wt Readings from Last 1 Encounters:  08/10/24 70 kg   Ideal Body Weight:  52.3 kg  BMI:  Body mass index is 27.34 kg/m.  Estimated Nutritional Needs:   Kcal:  1700-1900  Protein:  100-115g  Fluid:  >1.7L/day  Blair Deaner MS, RD, LDN Registered Dietitian I Clinical Nutrition RD Inpatient Contact Info in Amion

## 2024-08-13 NOTE — Anesthesia Postprocedure Evaluation (Signed)
"   Anesthesia Post Note  Patient: [facility]  Procedure(s) Performed: EGD (ESOPHAGOGASTRODUODENOSCOPY) STENT REMOVAL INSERTION, STENT, ESOPHAGUS CONTROL OF HEMORRHAGE, GI TRACT, ENDOSCOPIC, BY CLIPPING OR OVERSEWING BRONCHOSCOPY, FLEXIBLE IRRIGATION, BRONCHUS     Patient location during evaluation: PACU Anesthesia Type: General Level of consciousness: awake and alert Pain management: pain level controlled Vital Signs Assessment: post-procedure vital signs reviewed and stable Respiratory status: spontaneous breathing, nonlabored ventilation and respiratory function stable Cardiovascular status: stable and blood pressure returned to baseline Anesthetic complications: no   No notable events documented.  Last Vitals:  Vitals:   08/13/24 1645 08/13/24 1652  BP: (!) 168/68   Pulse: 77 78  Resp: 15 15  Temp:  36.7 C  SpO2: 95% 95%                    Debby FORBES Like      "

## 2024-08-13 NOTE — Transfer of Care (Signed)
 Immediate Anesthesia Transfer of Care Note  Patient: [name]  Procedure(s) Performed: EGD (ESOPHAGOGASTRODUODENOSCOPY) STENT REMOVAL INSERTION, STENT, ESOPHAGUS CONTROL OF HEMORRHAGE, GI TRACT, ENDOSCOPIC, BY CLIPPING OR OVERSEWING BRONCHOSCOPY, FLEXIBLE IRRIGATION, BRONCHUS  Patient Location: PACU  Anesthesia Type:General  Level of Consciousness: awake, alert , and oriented  Airway & Oxygen Therapy: Patient Spontanous Breathing and Patient connected to face mask oxygen  Post-op Assessment: Report given to RN and Post -op Vital signs reviewed and stable  Post vital signs: Reviewed and stable  Last Vitals:  Vitals Value Taken Time  BP 156/75 08/13/24 16:20  Temp    Pulse 82 08/13/24 16:30  Resp 15 08/13/24 16:30  SpO2 90 % 08/13/24 16:30  Vitals shown include unfiled device data.  Last Pain:  Vitals:   08/13/24 1321  TempSrc: Temporal  PainSc: 0-No pain      Patients Stated Pain Goal: 3 (08/13/24 0604)  Complications: No notable events documented.

## 2024-08-13 NOTE — H&P (View-Only) (Signed)
" ° °   °  [address]       [place] [address]             [phone]       7 Days Post-Op Procedures (LRB): EGD (ESOPHAGOGASTRODUODENOSCOPY) (N/A)  Subjective: Patient sleeping and briefly awakened this am. She asked why she still has hematuria.  Objective: Vital signs in last 24 hours: Temp:  [98.6 F (37 C)-103.2 F (39.6 C)] 98.6 F (37 C) (01/16 0315) Pulse Rate:  [78-83] 78 (01/16 0315) Cardiac Rhythm: Normal sinus rhythm (01/15 1906) Resp:  [18-20] 20 (01/16 0315) BP: (115-175)/(50-71) 152/59 (01/16 0315) SpO2:  [92 %-94 %] 94 % (01/16 0315)     Intake/Output from previous day: 01/15 0701 - 01/16 0700 In: 2541.1 [I.V.:1807; IV Piggyback:734] Out: 775 [Urine:775]   Physical Exam:  Cardiovascular: RRR Pulmonary: Clear to auscultation on left and diminished right base Abdomen: Soft, non tender, bowel sounds present. Extremities: No lower extremity edema. Wounds: Clean and dry.  No erythema or signs of infection. Chest Tube: to water seal, light yellowish drainage in Pleura Vac  Lab Results: CBC: Recent Labs    08/12/24 0546 08/13/24 0500  WBC 11.6* 11.1*  HGB 10.5* 9.3*  HCT 30.2* 27.1*  PLT 182 156   BMET:  Recent Labs    08/12/24 0546 08/12/24 1500  NA 134* 135  K 4.0 3.5  CL 98 105  CO2 25 22  GLUCOSE 128* 96  BUN 23 24*  CREATININE 1.43* 1.54*  CALCIUM 8.5* 7.0*    PT/INR: No results for input(s): LABPROT, INR in the last 72 hours. ABG:  INR: Will add last result for INR, ABG once components are confirmed Will add last 4 CBG results once components are confirmed  Assessment/Plan:  1. CV - SR. Hypertensive. On Clonidine  patch 0.3 mg, Lopressor  15 mg IV QID, Hydralazine  PRN 2.  Pulmonary - On room air. Chest tube is to water seal, light yellowish drainage. Chest tube with scant output. CXR this am shows patient rotated to the left, small right pleural effusion, stent still displaced (in the stomach). CT done  yesterday showed displaced esophageal stent located in the body of the stomach, small loculated right pleural effusion, and small pocket of air in the mediastinum posterior to the bronchus intermedius appears to communicate with the lumen of the bronchus intermedius. Dr. Kerrin to do bronchoscopy today. 3. GI-NPO, IVF 4. CBGs 102/96/89. 5. Anemia-H and H this am decreased to 9.3 and 27.1. 6. ID-on Zosyn , Diflucan , and Vancomycin  for esophageal leak. Probable aspiration Tmax yesterday to 103.2. Last fever 3 am 01/15 101.3. WBC this am 11,100. Respiratory gram stain shows moderate gram negative rods, culture pending. Blood culture pending. 7. On Lovenox  for DVT prophylaxis 8. History of tobacco abuse-continue Nicotine  patch 9. AKI-creatinine yesterday 1.54. Given LR bolus. Recheck BMET. Regarding hematuria, UA showed few bacteria, 21-50 RBC, 6-10 WBC. CT done yesterday showed non obstructing left renal calculi but no hydronephrosis.  Donielle M ZimmermanPA-C 08/13/2024,6:53 AM  Patient seen and examined, agree with above Overall looks better- to have stent replaced today Will plan bronch to look at airway and get cultures while under anesthesia  Loye Reininger C. Kerrin, MD [facility] [phone]  "

## 2024-08-13 NOTE — Progress Notes (Addendum)
" ° °   °  [address]       [place] [address]             [phone]       7 Days Post-Op Procedures (LRB): EGD (ESOPHAGOGASTRODUODENOSCOPY) (N/A)  Subjective: Patient sleeping and briefly awakened this am. She asked why she still has hematuria.  Objective: Vital signs in last 24 hours: Temp:  [98.6 F (37 C)-103.2 F (39.6 C)] 98.6 F (37 C) (01/16 0315) Pulse Rate:  [78-83] 78 (01/16 0315) Cardiac Rhythm: Normal sinus rhythm (01/15 1906) Resp:  [18-20] 20 (01/16 0315) BP: (115-175)/(50-71) 152/59 (01/16 0315) SpO2:  [92 %-94 %] 94 % (01/16 0315)     Intake/Output from previous day: 01/15 0701 - 01/16 0700 In: 2541.1 [I.V.:1807; IV Piggyback:734] Out: 775 [Urine:775]   Physical Exam:  Cardiovascular: RRR Pulmonary: Clear to auscultation on left and diminished right base Abdomen: Soft, non tender, bowel sounds present. Extremities: No lower extremity edema. Wounds: Clean and dry.  No erythema or signs of infection. Chest Tube: to water seal, light yellowish drainage in Pleura Vac  Lab Results: CBC: Recent Labs    08/12/24 0546 08/13/24 0500  WBC 11.6* 11.1*  HGB 10.5* 9.3*  HCT 30.2* 27.1*  PLT 182 156   BMET:  Recent Labs    08/12/24 0546 08/12/24 1500  NA 134* 135  K 4.0 3.5  CL 98 105  CO2 25 22  GLUCOSE 128* 96  BUN 23 24*  CREATININE 1.43* 1.54*  CALCIUM 8.5* 7.0*    PT/INR: No results for input(s): LABPROT, INR in the last 72 hours. ABG:  INR: Will add last result for INR, ABG once components are confirmed Will add last 4 CBG results once components are confirmed  Assessment/Plan:  1. CV - SR. Hypertensive. On Clonidine  patch 0.3 mg, Lopressor  15 mg IV QID, Hydralazine  PRN 2.  Pulmonary - On room air. Chest tube is to water seal, light yellowish drainage. Chest tube with scant output. CXR this am shows patient rotated to the left, small right pleural effusion, stent still displaced (in the stomach). CT done  yesterday showed displaced esophageal stent located in the body of the stomach, small loculated right pleural effusion, and small pocket of air in the mediastinum posterior to the bronchus intermedius appears to communicate with the lumen of the bronchus intermedius. Dr. Kerrin to do bronchoscopy today. 3. GI-NPO, IVF 4. CBGs 102/96/89. 5. Anemia-H and H this am decreased to 9.3 and 27.1. 6. ID-on Zosyn , Diflucan , and Vancomycin  for esophageal leak. Probable aspiration Tmax yesterday to 103.2. Last fever 3 am 01/15 101.3. WBC this am 11,100. Respiratory gram stain shows moderate gram negative rods, culture pending. Blood culture pending. 7. On Lovenox  for DVT prophylaxis 8. History of tobacco abuse-continue Nicotine  patch 9. AKI-creatinine yesterday 1.54. Given LR bolus. Recheck BMET. Regarding hematuria, UA showed few bacteria, 21-50 RBC, 6-10 WBC. CT done yesterday showed non obstructing left renal calculi but no hydronephrosis.  Donielle M ZimmermanPA-C 08/13/2024,6:53 AM  Patient seen and examined, agree with above Overall looks better- to have stent replaced today Will plan bronch to look at airway and get cultures while under anesthesia  Loye Reininger C. Kerrin, MD [facility] [phone]  "

## 2024-08-13 NOTE — Progress Notes (Signed)
 Mobility Specialist Progress Note;    08/13/24 0920  Mobility  Activity Ambulated with assistance  Level of Assistance Standby assist, set-up cues, supervision of patient - no hands on  Assistive Device Other (Comment) (IVpole)  Distance Ambulated (ft) 500 ft  Activity Response Tolerated well  Mobility Referral Yes  Mobility visit 1 Mobility  Mobility Specialist Start Time (ACUTE ONLY) 0920  Mobility Specialist Stop Time (ACUTE ONLY) 0932  Mobility Specialist Time Calculation (min) (ACUTE ONLY) 12 min   Patient received in bed, agreeable to participate in mobility. Ambulated in hallways independently with steady gait. Returned to room without complaint or incident. Was left in bed with all needs met, call bell in reach.    Florine Oak Mobility Specialist Please Neurosurgeon or [employer] [phone]

## 2024-08-13 NOTE — Anesthesia Procedure Notes (Signed)
 Procedure Name: Intubation Date/Time: 08/13/2024 2:20 PM  Performed by: Hedy Jarred, CRNAPre-anesthesia Checklist: Patient identified, Emergency Drugs available, Suction available and Patient being monitored Patient Re-evaluated:Patient Re-evaluated prior to induction Oxygen Delivery Method: Circle System Utilized Preoxygenation: Pre-oxygenation with 100% oxygen Induction Type: IV induction Ventilation: Mask ventilation without difficulty Laryngoscope Size: Mac and 3 Grade View: Grade I Tube type: Oral Tube size: 8.0 mm Number of attempts: 1 Airway Equipment and Method: Stylet and Oral airway Placement Confirmation: ETT inserted through vocal cords under direct vision, positive ETCO2 and breath sounds checked- equal and bilateral Secured at: 22 cm Tube secured with: Tape Dental Injury: Teeth and Oropharynx as per pre-operative assessment

## 2024-08-14 ENCOUNTER — Inpatient Hospital Stay (HOSPITAL_COMMUNITY)

## 2024-08-14 DIAGNOSIS — Z902 Acquired absence of lung [part of]: Secondary | ICD-10-CM

## 2024-08-14 DIAGNOSIS — K921 Melena: Secondary | ICD-10-CM | POA: Diagnosis not present

## 2024-08-14 DIAGNOSIS — K9189 Other postprocedural complications and disorders of digestive system: Secondary | ICD-10-CM

## 2024-08-14 DIAGNOSIS — N179 Acute kidney failure, unspecified: Secondary | ICD-10-CM | POA: Diagnosis not present

## 2024-08-14 DIAGNOSIS — D6489 Other specified anemias: Secondary | ICD-10-CM

## 2024-08-14 LAB — CBC WITH DIFFERENTIAL/PLATELET
Abs Immature Granulocytes: 0.16 K/uL — ABNORMAL HIGH (ref 0.00–0.07)
Basophils Absolute: 0 K/uL (ref 0.0–0.1)
Basophils Relative: 0 %
Eosinophils Absolute: 0 K/uL (ref 0.0–0.5)
Eosinophils Relative: 0 %
HCT: 27.9 % — ABNORMAL LOW (ref 36.0–46.0)
Hemoglobin: 9.5 g/dL — ABNORMAL LOW (ref 12.0–15.0)
Immature Granulocytes: 2 %
Lymphocytes Relative: 11 %
Lymphs Abs: 1.2 K/uL (ref 0.7–4.0)
MCH: 31.8 pg (ref 26.0–34.0)
MCHC: 34.1 g/dL (ref 30.0–36.0)
MCV: 93.3 fL (ref 80.0–100.0)
Monocytes Absolute: 1 K/uL (ref 0.1–1.0)
Monocytes Relative: 10 %
Neutro Abs: 8.4 K/uL — ABNORMAL HIGH (ref 1.7–7.7)
Neutrophils Relative %: 77 %
Platelets: 198 K/uL (ref 150–400)
RBC: 2.99 MIL/uL — ABNORMAL LOW (ref 3.87–5.11)
RDW: 14.7 % (ref 11.5–15.5)
WBC: 10.8 K/uL — ABNORMAL HIGH (ref 4.0–10.5)
nRBC: 0 % (ref 0.0–0.2)

## 2024-08-14 LAB — CBC
HCT: 31.4 % — ABNORMAL LOW (ref 36.0–46.0)
HCT: 25 % — ABNORMAL LOW (ref 36.0–46.0)
Hemoglobin: 10.6 g/dL — ABNORMAL LOW (ref 12.0–15.0)
Hemoglobin: 8.3 g/dL — ABNORMAL LOW (ref 12.0–15.0)
MCH: 31.4 pg (ref 26.0–34.0)
MCH: 31.1 pg (ref 26.0–34.0)
MCHC: 33.8 g/dL (ref 30.0–36.0)
MCHC: 33.2 g/dL (ref 30.0–36.0)
MCV: 92.9 fL (ref 80.0–100.0)
MCV: 93.6 fL (ref 80.0–100.0)
Platelets: 199 K/uL (ref 150–400)
Platelets: 172 K/uL (ref 150–400)
RBC: 3.38 MIL/uL — ABNORMAL LOW (ref 3.87–5.11)
RBC: 2.67 MIL/uL — ABNORMAL LOW (ref 3.87–5.11)
RDW: 14.6 % (ref 11.5–15.5)
RDW: 14.9 % (ref 11.5–15.5)
WBC: 10.5 K/uL (ref 4.0–10.5)
WBC: 11.4 K/uL — ABNORMAL HIGH (ref 4.0–10.5)
nRBC: 0 % (ref 0.0–0.2)
nRBC: 0 % (ref 0.0–0.2)

## 2024-08-14 LAB — GLUCOSE, CAPILLARY
Glucose-Capillary: 138 mg/dL — ABNORMAL HIGH (ref 70–99)
Glucose-Capillary: 127 mg/dL — ABNORMAL HIGH (ref 70–99)
Glucose-Capillary: 138 mg/dL — ABNORMAL HIGH (ref 70–99)
Glucose-Capillary: 225 mg/dL — ABNORMAL HIGH (ref 70–99)

## 2024-08-14 LAB — URINALYSIS, ROUTINE W REFLEX MICROSCOPIC
Bilirubin Urine: NEGATIVE
Glucose, UA: NEGATIVE mg/dL
Ketones, ur: 5 mg/dL — AB
Nitrite: NEGATIVE
Protein, ur: 100 mg/dL — AB
RBC / HPF: >50 RBC/hpf (ref 0–5)
Specific Gravity, Urine: 1.018 (ref 1.005–1.030)
pH: 5 (ref 5.0–8.0)

## 2024-08-14 LAB — BASIC METABOLIC PANEL WITH GFR
Anion gap: 15 (ref 5–15)
BUN: 39 mg/dL — ABNORMAL HIGH (ref 8–23)
CO2: 18 mmol/L — ABNORMAL LOW (ref 22–32)
Calcium: 8 mg/dL — ABNORMAL LOW (ref 8.9–10.3)
Chloride: 107 mmol/L (ref 98–111)
Creatinine, Ser: 2.34 mg/dL — ABNORMAL HIGH (ref 0.44–1.00)
GFR, Estimated: 22 mL/min — ABNORMAL LOW
Glucose, Bld: 142 mg/dL — ABNORMAL HIGH (ref 70–99)
Potassium: 4 mmol/L (ref 3.5–5.1)
Sodium: 140 mmol/L (ref 135–145)

## 2024-08-14 LAB — APTT: aPTT: 42 s — ABNORMAL HIGH (ref 24–36)

## 2024-08-14 LAB — HEPATITIS B SURFACE ANTIGEN: Hepatitis B Surface Ag: NONREACTIVE

## 2024-08-14 LAB — CULTURE, RESPIRATORY W GRAM STAIN: Culture: NORMAL

## 2024-08-14 LAB — FIBRINOGEN: Fibrinogen: 385 mg/dL (ref 210–475)

## 2024-08-14 LAB — VANCOMYCIN, RANDOM: Vancomycin Rm: 9 ug/mL

## 2024-08-14 LAB — HEPATITIS C ANTIBODY: HCV Ab: NONREACTIVE

## 2024-08-14 LAB — PROTIME-INR
INR: 1.3 — ABNORMAL HIGH (ref 0.8–1.2)
Prothrombin Time: 16.7 s — ABNORMAL HIGH (ref 11.4–15.2)

## 2024-08-14 LAB — HIV ANTIBODY (ROUTINE TESTING W REFLEX): HIV Screen 4th Generation wRfx: NONREACTIVE

## 2024-08-14 LAB — PROTEIN / CREATININE RATIO, URINE
Creatinine, Urine: 66 mg/dL
Protein Creatinine Ratio: 1.7 mg/mg — ABNORMAL HIGH
Total Protein, Urine: 114 mg/dL

## 2024-08-14 LAB — CREATININE, URINE, RANDOM: Creatinine, Urine: 72 mg/dL

## 2024-08-14 LAB — SODIUM, URINE, RANDOM: Sodium, Ur: 48 mmol/L

## 2024-08-14 MED ORDER — METOPROLOL TARTRATE 25 MG/10 ML ORAL SUSPENSION
100.0000 mg | Freq: Two times a day (BID) | ORAL | Status: DC
  Filled 2024-08-14 (×2): qty 40

## 2024-08-14 MED ORDER — SODIUM CHLORIDE 0.9 % IV SOLN: INTRAVENOUS | Status: DC

## 2024-08-14 MED ORDER — PANTOPRAZOLE SODIUM 40 MG IV SOLR
40.0000 mg | Freq: Two times a day (BID) | INTRAVENOUS | Status: DC
  Administered 2024-08-14 – 2024-08-18 (×8): 40 mg via INTRAVENOUS
  Filled 2024-08-14 (×8): qty 10

## 2024-08-14 MED ORDER — VANCOMYCIN HCL 750 MG/150ML IV SOLN
750.0000 mg | Freq: Once | INTRAVENOUS | Status: DC
  Administered 2024-08-14: 750 mg via INTRAVENOUS
  Filled 2024-08-14: qty 150

## 2024-08-14 MED ORDER — NITROGLYCERIN 0.2 MG/HR TD PT24
0.2000 mg | MEDICATED_PATCH | Freq: Every day | TRANSDERMAL | Status: DC
  Administered 2024-08-14 – 2024-08-15 (×2): 0.2 mg via TRANSDERMAL
  Filled 2024-08-14 (×2): qty 1

## 2024-08-14 MED ORDER — LACTULOSE 10 GM/15ML PO SOLN: 20.0000 g | Freq: Two times a day (BID) | ORAL | Status: DC | PRN

## 2024-08-14 MED ORDER — ACETAMINOPHEN 160 MG/5ML PO SOLN: 1000.0000 mg | Freq: Four times a day (QID) | ORAL | Status: DC

## 2024-08-14 MED ORDER — METOPROLOL TARTRATE 5 MG/5ML IV SOLN
15.0000 mg | Freq: Four times a day (QID) | INTRAVENOUS | Status: DC
  Administered 2024-08-14 – 2024-08-15 (×5): 15 mg via INTRAVENOUS
  Filled 2024-08-14 (×5): qty 15

## 2024-08-14 MED ORDER — BISACODYL 10 MG RE SUPP
10.0000 mg | Freq: Once | RECTAL | Status: AC
  Administered 2024-08-14: 10 mg via RECTAL
  Filled 2024-08-14: qty 1

## 2024-08-14 MED ORDER — ENOXAPARIN SODIUM 30 MG/0.3ML IJ SOSY: 30.0000 mg | PREFILLED_SYRINGE | Freq: Every day | INTRAMUSCULAR | Status: DC

## 2024-08-14 MED ORDER — TRAVASOL 10 % IV SOLN
INTRAVENOUS | Status: DC
  Filled 2024-08-14: qty 1062

## 2024-08-14 MED ORDER — ACETAMINOPHEN 10 MG/ML IV SOLN
1000.0000 mg | Freq: Four times a day (QID) | INTRAVENOUS | Status: AC
  Administered 2024-08-15 – 2024-08-16 (×4): 1000 mg via INTRAVENOUS
  Filled 2024-08-14 (×4): qty 100

## 2024-08-14 MED ORDER — GUAIFENESIN 100 MG/5ML PO LIQD: 15.0000 mL | Freq: Two times a day (BID) | ORAL | Status: DC

## 2024-08-14 MED ORDER — IOHEXOL 300 MG/ML  SOLN
100.0000 mL | Freq: Once | INTRAMUSCULAR | Status: AC | PRN
  Administered 2024-08-14: 100 mL via ORAL

## 2024-08-14 MED ORDER — ACETAMINOPHEN 10 MG/ML IV SOLN
1000.0000 mg | Freq: Four times a day (QID) | INTRAVENOUS | Status: AC
  Administered 2024-08-14 – 2024-08-15 (×4): 1000 mg via INTRAVENOUS
  Filled 2024-08-14 (×4): qty 100

## 2024-08-14 MED ORDER — IPRATROPIUM-ALBUTEROL 0.5-2.5 (3) MG/3ML IN SOLN
3.0000 mL | RESPIRATORY_TRACT | Status: DC
  Administered 2024-08-14 – 2024-08-15 (×5): 3 mL via RESPIRATORY_TRACT
  Filled 2024-08-14 (×6): qty 3

## 2024-08-14 NOTE — Progress Notes (Signed)
 Brief Pharmacy Note  Random vanc level this am 9, drawn ~48h after loading dose; SCr still trending up.  Will give vancomycin  750mg  IV x1 for risk of developing sepsis, remains on fluconazole  and Zosyn  as well; continue to monitor.  Marvetta Dauphin, PharmD, BCPS 08/14/2024 6:34 AM

## 2024-08-14 NOTE — Progress Notes (Addendum)
" ° °   °  [address] [place] [address]             [phone]    RN called and reports the patient had a bright red bloody bowel movement after giving her a suppository. The patient reports this has happened 1 other time but she did not let anyone know. The RN reports she did not notice any obvious hemorrhoids. Lovenox  has been discontinued and STAT CBC has been ordered. I spoke with GI earlier and they report they are planning to see her today and I have let them know she has developed BRBPR. Her vital signs remain stable, most recent BP was 169/70.  Today's Vitals   08/14/24 1100 08/14/24 1116 08/14/24 1130 08/14/24 1224  BP:      Pulse:  78  67  Resp:  15  18  Temp:  98.5 F (36.9 C)    TempSrc:  Oral    SpO2:  100%  100%  Weight:      Height:      PainSc: 8   Asleep    Body mass index is 27.34 kg/m.  Con GORMAN Bend, PA-C  "

## 2024-08-14 NOTE — Progress Notes (Signed)
 PHARMACY - TOTAL PARENTERAL NUTRITION CONSULT NOTE  Indication: Esophageal leak  Patient Measurements: Height: 5' 3 (160 cm) Weight: 70 kg (154 lb 5.2 oz) IBW/kg (Calculated) : 52.4 TPN AdjBW (KG): 57.4 Body mass index is 27.34 kg/m.  Assessment:  33 YOF with NSCLC here for RML lobectomy, resection of esophageal diverticulum, and lymph node dissection on 12/29.  Found to have a small contained leak post-op.  Planning to repeat esophagram on 07/30/24. Patient reports an intentional weight loss of 40 lbs over a couple of years when she was diagnosed with DM.  She typically drinks one Orgain shake every morning for breakfast and eats one meal a day, which could be pizza, spaghetti, chicken, beef or fish along with vegetables and fruits.  Patient was eating normally until her planned surgery. RD concerned with patient starving herself. Patient was on TPN 12/31 - 1/12 and patient was advanced to dysphagia 1 diet. However, patient was NPO again 1/14-1/16. On 1/17 IR reporting small amount of leak from distal esophageal stent. Patient developed AKI since TPN was held. Pharmacy reconsulted for TPN 1/17.  Glucose / Insulin : hx DM, A1c 5.9% - BG < 150, gave dexamethaxone 1/16, used 0 units SSI/24hr Electrolytes: CoCa 9.3, others wnl  Renal: SCr 2.34 (BL 0.8), BUN 39 Hepatic: LFTs / tbili WNL, albumin  2.9, TG 172 Intake / Output; MIVF: NS @100ml /hr; UOP none charted, LBM 1/12, chest tube 32 mL  GI Imaging:   12/30 esophagram: Positive for contrast extravasation and an esophageal leak in the mid esophagus 1/2 esophagram: Persistent contrast extravasation and esophageal leak, similar to prior 1/6 esophagram: Limited contrast study due to patient aspiration and severe coughing spell. Study aborted. 1/12 esophagram: Patent esophageal stent. No evidence for extravasation or leak, Delayed esophageal emptying and gastroesophageal reflux leading to stasis of contrast in the esophagus. 1/15 CT: displaced  esophageal stent in stomach, no bowel obstruction  1/17 esophagram: small volume esophageal leak at distal stent GI Surgeries / Procedures:  12/30 right middle lobectomy, resection esophageal diverticulum, lymph node biopsy  1/7 EGD- stent placement  1/9 EGD stent repositioned d/t migration to stomach  1/16 EGD- stent placed at esophageal ulcer, removed gastric stent, normal duoenum   Central access: PICC 07/28/24 TPN start date: 07/28/24> 1/12; 1/17 >>   Nutritional Goals:  Goal TPN rate 75 ml/hr to provide 106g AA and 1742 kCal  RD Estimated Needs Total Energy Estimated Needs: 1700-1900 Total Protein Estimated Needs: 100-115g Total Fluid Estimated Needs: >1.7L/day  Current Nutrition:  TPN  1/08 CLD - passed swallow 1/09 CLD - patient feels can't swallow well > NPO 1/13 advanced to DYS1 1/14- NPO   Plan: Restart TPN at goal 75 mL/hr at 1800, meeting 100% estimated needs  Electrolytes in TPN: Na 100 mEq/L, K 20 mEq/L, Ca 3 mEq/L, Mg 3 mEq/L, and Phos 5 mmol/L. Cl:Ac max acet  Add standard MVI and trace elements to TPN Continue Moderate q6h SSI and adjust as needed   Decrease NS to 25ml/hr at 1800 (total 14ml/hr with TPN) Monitor TPN labs on Mon/Thurs, and PRN   Thank you for allowing pharmacy to be a part of this patients care.  Jinnie Door, PharmD, BCPS, BCCP Clinical Pharmacist  Please check AMION for all [facility] Pharmacy phone numbers After 10:00 PM, call Main Pharmacy [phone]

## 2024-08-14 NOTE — Progress Notes (Addendum)
 "     [address] [place] [address]             [phone]      1 Day Post-Op Procedures (LRB): EGD (ESOPHAGOGASTRODUODENOSCOPY) (N/A) BRONCHOSCOPY, FLEXIBLE STENT REMOVAL INSERTION, STENT, ESOPHAGUS (N/A) CONTROL OF HEMORRHAGE, GI TRACT, ENDOSCOPIC, BY CLIPPING OR OVERSEWING IRRIGATION, BRONCHUS Subjective: Patient reports she feels okay, no specific complaints but is afraid she will get worse again when she starts clears.  Objective: Vital signs in last 24 hours: Temp:  [97.8 F (36.6 C)-98.8 F (37.1 C)] 98.2 F (36.8 C) (01/17 0758) Pulse Rate:  [76-86] 86 (01/17 0758) Cardiac Rhythm: Normal sinus rhythm (01/17 0700) Resp:  [14-20] 15 (01/17 0758) BP: (156-180)/(62-81) 158/67 (01/17 0758) SpO2:  [91 %-100 %] 100 % (01/17 0758)  Hemodynamic parameters for last 24 hours:    Intake/Output from previous day: 01/16 0701 - 01/17 0700 In: 2149.9 [I.V.:1510.6; IV Piggyback:639.3] Out: 410 [Urine:400; Chest Tube:10] Intake/Output this shift: Total I/O In: 10 [I.V.:10] Out: -   General appearance: alert, cooperative, and no distress Neurologic: intact Heart: regular rate and rhythm, S1, S2 normal, no murmur, click, rub or gallop Lungs: diminished right sided breath sounds Abdomen: soft, non-tender; bowel sounds normal; no masses,  no organomegaly Extremities: extremities normal, atraumatic, no cyanosis or edema Wound: Clean and dry without sign of infection  Lab Results: Recent Labs    08/13/24 0500 08/14/24 0500  WBC 11.1* 10.5  HGB 9.3* 10.6*  HCT 27.1* 31.4*  PLT 156 199   BMET:  Recent Labs    08/13/24 1000 08/14/24 0500  NA 137 140  K 3.9 4.0  CL 105 107  CO2 21* 18*  GLUCOSE 93 142*  BUN 30* 39*  CREATININE 2.01* 2.34*  CALCIUM 8.1* 8.0*    PT/INR: No results for input(s): LABPROT, INR in the last 72 hours. ABG    Component Value Date/Time   TCO2 25 11/10/2009 0952   CBG (last 3)  Recent Labs     08/13/24 1711 08/13/24 2350 08/14/24 0539  GLUCAP 84 116* 138*    Assessment/Plan: S/P Procedures (LRB): EGD (ESOPHAGOGASTRODUODENOSCOPY) (N/A) BRONCHOSCOPY, FLEXIBLE STENT REMOVAL INSERTION, STENT, ESOPHAGUS (N/A) CONTROL OF HEMORRHAGE, GI TRACT, ENDOSCOPIC, BY CLIPPING OR OVERSEWING IRRIGATION, BRONCHUS  Neuro: Anxiety, on ativan . Pain, continue IV Tylenol , continue fentanyl  patch, and dilaudid  prn.   CV: Hypertension, continue clonidine  patch, nitro patch, IV Lopresspr and hydralazine  prn. NSR, HR 70s-80s.   Pulm: Bronchoscopy yesterday showed a small hole in the bronchus intermedius with secretions in the area. Saturating well on RA. CT in place with 10cc serous/yellowish output. CXR showed a small right pleural effusion and patchy hazy opacities in the right mid and lower lung fields increasing in the right mid lung with otherwise stable aeration and stable post surgical changes along the right hilum. Stent is in place in the upper to mid thoracic esophagus. Will add duoneb scheduled. Encourage IS and ambulation. Possible esophageal spasms but patient is allergic to diltiazem , so will hold off for now.   GI: Esophageal leak following esophageal diverticulum resection. New esophageal stent placed over 20mm esophageal ulcer yesterday by GI and clipped into place due to migration to the stomach of the last stent.  Esophagram this morning without clear leak with stent in place pending final read. But PA messaged and reports they think there is still a leak. Currently NPO but GI recommended clear liquid diet in their op note yesterday. Will  reconsult GI to review images and continue NPO, will restart TPN. Last BM 1/13. Dulcolax suppository ordered daily prn, will give one this AM.   Renal: AKI, Cr 2.34. Nephrology has been consulted and following. Will d/c Vancomycin . If labwork is unremarkable may need to consider renal biopsy. Continue IV fluids. UO 400cc/24hrs recorded.   ID:  Leukocytosis resolved. Tmax 98.8, no fevers in about 48 hours.  Expected postop ABLA: H/H 10.6/31.4, stable. Not clinically significant at this time.   DVT Prophylaxis: Lovenox   Dispo: IR PA messaged and reports they still believe there is an esophageal leak. On my first impression I did not see an obvious leak but there is an image where you can see a minimal amount of contrast leaking from the distal end of the esophageal stent. Will reconsult GI for input, may need to be moved again. Will restart TPN today.  Addendum: GI does not have anyone in house that can move the stent this weekend but they will follow her this weekend.    LOS: 19 days    Con GORMAN Bend, PA-C 08/14/2024  Esophagram completed this morning which shows persistent small leak from the distal end of the esophageal stent.  Will keep the patient n.p.o. and resume TPN.  Also will discontinue vancomycin  per nephrology recs.  Nephrology to check several labs to explain hematuria and AKI.  Patient was discussed with GI who will hopefully attempt to reposition the stent sometime next week.  Con Clunes, MD Cardiothoracic Surgery Pager: [phone]  "

## 2024-08-14 NOTE — Progress Notes (Signed)
 Nutrition Brief Note  RD team received a consult for initiation of TPN. RD team has been following patient during admission. Please see previous follow-up note from yesterday, August 13, 2024. Medications and labs reviewed.   INTERVENTION:  TPN to meet estimate nutrient needs, managed by Pharmacy: -Kcal: 1700-1900 kcals -Protein: 100-115g   -Add MVI w/ minerals   Monitor diet advancement/tolerance  When diet advanced:  Add Glucerna shake (chocolate) and Magic Cup to augment intake Patient may bring in Orgain protein powder to be mixed with milk to mimic her home regimen when diet advanced to full liquids Add MVI w/ minerals Monitor bowels and need for bowel regimen as no BM x4 days noted   RD team will continue to follow.   Nestora Glatter RD, LDN Registered Dietitian I Please see AMION for contact information

## 2024-08-14 NOTE — Progress Notes (Signed)
 Patient ID: [name], female   DOB: [birth year], 69 y.o.   MRN: [id number]    Progress Note   Subjective   Day # 19 CC; non-small cell lung cancer admitted for RML lobectomy and resection of esophageal diverticulum/lymph node dissection 07/26/2024.  Esophageal perforation/leak postoperatively  EGD with covered esophageal stent placement per CT surgery 08/04/2024 Repeat EGD with repositioning of the covered esophageal stent on 08/06/2024 due to stent migration.  Patient underwent EGD yesterday/Dr. Rollin with removal of the metal stent from the gastric body, patient was noted to have 1 cratered esophageal ulcer at 30 cm from the incisors about 20 mm in diameter this was stented with a 23 mm x 12.5 cm wall flex covered stent.  Because of the 2 prior stent migration's, 3 hemoclips were placed in the proximal portion of the stent to reduce risk of migration.  Noted the esophagus was hyper motile with significant contractions.  Chest x-ray today-small right effusion, patchy hazy opacity right mid and lung lower fields increasing in the right midlung, posttreatment changes along the right hilum, Esophagram today-thin line of extraluminal contrast from the distal right end of the stent consistent with small esophageal leak  Tmax 98 8  WBC 10.5/hemoglobin 10.6/hematocrit 31.4  TPN to be restarted today IV Zosyn  Lovenox   Pt say she feels ok  no significant esophageal pain, no c/o abd pain, sad that she is not able to drink anything.  Pt has had a grossly bloody BM this afternoon , and says she had a similar episode 2 days ago after a dulcolax supp-   I viewed  the stool in commode -about 200 cc of dark red bloody stool with clots    Objective   Vital signs in last 24 hours: Temp:  [97.8 F (36.6 C)-98.8 F (37.1 C)] 98.5 F (36.9 C) (01/17 1116) Pulse Rate:  [67-86] 67 (01/17 1224) Resp:  [14-20] 18 (01/17 1224) BP: (156-180)/(62-81) 158/67 (01/17 0758) SpO2:  [91 %-100 %] 100 % (01/17  1224) Last BM Date : 08/10/24 General:  older   white female in NAD, family at bedside Heart:  Regular rate and rhythm; no murmurs Lungs: Respirations even and unlabored, lungs CTA bilaterally -chest tube on Right Abdomen:  Soft, nontender and nondistended. Normal bowel sounds. Extremities:  Without edema. Neurologic:  Alert and oriented,  grossly normal neurologically. Psych:  Cooperative. Normal mood and affect.  Intake/Output from previous day: 01/16 0701 - 01/17 0700 In: 2149.9 [I.V.:1510.6; IV Piggyback:639.3] Out: 410 [Urine:400; Chest Tube:10] Intake/Output this shift: Total I/O In: 1024.4 [I.V.:521.7; IV Piggyback:502.7] Out: 0   Lab Results: Recent Labs    08/12/24 0546 08/13/24 0500 08/14/24 0500  WBC 11.6* 11.1* 10.5  HGB 10.5* 9.3* 10.6*  HCT 30.2* 27.1* 31.4*  PLT 182 156 199   BMET Recent Labs    08/12/24 1500 08/13/24 1000 08/14/24 0500  NA 135 137 140  K 3.5 3.9 4.0  CL 105 105 107  CO2 22 21* 18*  GLUCOSE 96 93 142*  BUN 24* 30* 39*  CREATININE 1.54* 2.01* 2.34*  CALCIUM 7.0* 8.1* 8.0*   LFT Recent Labs    08/12/24 1500  PROT 4.4*  ALBUMIN  2.3*  AST 20  ALT 12  ALKPHOS 44  BILITOT 0.3   PT/INR No results for input(s): LABPROT, INR in the last 72 hours.  Studies/Results: DG ESOPHAGUS W SINGLE CM (SOL OR THIN BA) Result Date: 08/14/2024 CLINICAL DATA:  69 year old female with history of a right mid lobe  lobectomy and midthoracic esophagus diverticula/matted resection in 07/26/2024 which was complicated persistent esophageal perforation. Patient is status post esophageal stent placement on 08/04/2024 which migrated into the stomach, she underwent old stent removal in a new esophageal stent placement on 08/13/2024. EXAM: WATER SOLUBLE ESOPHAGRAM TECHNIQUE: Single contrast examination was performed using 100 mL of water-soluble contrast. This exam was performed by Toya Cousin, PA-C, and was supervised and interpreted by Jackquline Boxer, MD.  FLUOROSCOPY: Radiation Exposure Index (as provided by the fluoroscopic device): 22.0 mGy Kerma COMPARISON:  One view CXR on 08/13/2024; CT chest abdomen pelvis without contrast on 08/12/2024; water-soluble esophagram on 08/09/2024; water-soluble esophagus on 07/27/2024. FINDINGS: Scout image shows a metal esophageal stent from the proximal thoracic esophagus to the mid thoracic esophagus. Surgical clips noted at the proximal end of the stent. Consolidation in the right middle lobe with surgical staples compatible with patient's history of right middle lobe lobectomy. Focused examination of the midthoracic esophagus with water soluble contrast was performed. There is a thin line of extraluminal contrast which develops from the distal RIGHT end of the stent. The external contrast slowly increases in volume over subsequent swallows. This volume of extraluminal contrast small but is consistent with esophageal leak. This is new from the water-soluble esophagram performed on 08/09/2024. The amount of extravasation is decreased when is compared to the esophagram performed on 06/30/2024. IMPRESSION: Small volume esophageal leak originating from the right distal end of esophageal stent. These results will be called to the ordering clinician or representative by the Radiologist Assistant, and communication documented in the PACS or [employer]. Electronically Signed   By: Jackquline Boxer M.D.   On: 08/14/2024 11:10   DG CHEST PORT 1 VIEW Result Date: 08/14/2024 EXAM: 1 VIEW(S) XRAY OF THE CHEST 08/14/2024 06:40:00 AM COMPARISON: Chest CT without contrast from 08/12/2024, portable chest 08/13/2024. CLINICAL HISTORY: Pleural effusion. FINDINGS: LINES, TUBES AND DEVICES: There is a right chest tube in place with the tip superimposing in the right lower paratracheal area. There is a right PICC with the tip in the distal SVC. There is a stent in the upper to mid thoracic esophagus. A stent was previously in the stomach,  which has either been pulled back into the esophagus or removed, or could be below the field of view. LUNGS AND PLEURA: There is a small right pleural effusion. Patchy hazy opacity in the right mid and lower lung fields. Post-treatment / postsurgical changes along the right hilum. Left lung is clear. Opacities are increasing in the right mid lung with otherwise stable aeration. No measurable pneumothorax. HEART AND MEDIASTINUM: No acute abnormality of the cardiac and mediastinal silhouettes. BONES AND SOFT TISSUES: No acute osseous abnormality. IMPRESSION: 1. Small right pleural effusion. 2. Patchy hazy opacity in the right mid and lower lung fields, increasing in the right mid lung, with otherwise stable aeration. 3. Post-treatment/postsurgical changes along the right hilum 4. Esophageal stent. Electronically signed by: Francis Quam MD 08/14/2024 08:01 AM EST RP Workstation: [id number]   DG C-Arm 1-60 Min Result Date: 08/13/2024 EXAM: FLUOROSCOPIC IMAGING TECHNIQUE: Fluoroscopy was provided by the radiology department for procedure. Radiologist was not present during examination. Fluoroscopy time 2 minutes 47 seconds. RADIATION DOSE INDEX: Reference Air Kerma: 23.93 mGy COMPARISON: None available. CLINICAL HISTORY: esophageal leak. FINDINGS: Intraoperative fluoroscopic imaging was performed. A second flared esophageal stent was placed in a satisfactory position. Hemostatic clips were noted in the proximal esophagus as well. By history, a migrated stent in the stomach was removed endoscopically prior  to this procedure. IMPRESSION: 1. Intraoperative fluoroscopic imaging as above. Please refer to the operative report for full details. Electronically signed by: Oneil Devonshire MD 08/13/2024 07:37 PM EST RP Workstation: [id number] C-Arm 1-60 Min-No Report Result Date: 08/13/2024 Fluoroscopy was utilized by the requesting physician.  No radiographic interpretation.   DG Chest Port 1 View Result Date:  08/13/2024 EXAM: 1 VIEW XRAY OF THE CHEST 08/13/2024 05:06:00 AM COMPARISON: 08/12/2024 CLINICAL HISTORY: [id number] S/P lobectomy of lung [id number] S/P lobectomy of lung [id number] S/P lobectomy of lung FINDINGS: LINES, TUBES AND DEVICES: Right PICC line stable in position. Right-sided thoracostomy tube is unchanged in position from previous exam. Migrated esophageal stent is again noted within the stomach. Upper abdominal stent noted. LUNGS AND PLEURA: Postsurgical changes of right lung. Increased right perihilar airspace opacity. A small right pleural effusion is slightly decreased in volume from the previous exam. No pneumothorax. HEART AND MEDIASTINUM: No acute abnormality of the cardiac and mediastinal silhouettes. BONES AND SOFT TISSUES: No acute osseous abnormality. IMPRESSION: 1. Postsurgical changes of the right lung with increased right perihilar airspace opacity. 2. Small right pleural effusion, slightly decreased in volume from the previous exam. 3. Stable right-sided thoracostomy tube and right PICC line. Electronically signed by: Waddell Calk MD 08/13/2024 06:00 AM EST RP Workstation: [id number]       Assessment / Plan:    #48 69 year old female with non-small cell lung cancer status post right middle lobectomy 07/26/2024 Patient had resection of an esophageal diverticulum at the time of surgery.  She was noted to have esophageal leak at the midesophagus the day after surgery.  She had been kept n.p.o. and on TPN. She underwent EGD 1/16 with removal of the previously placed stent from the stomach, requiring multiple attempts, She was found to have a cratered ulcer at 30 cm from the incisors as measured 20 mm in largest dimension and was stented with a 23 mm x 12.5 cm wall flex covered stent.  The diverticulectomy site was occluded with the stent.  To prevent migration 3 hemoclips were placed in the proximal portion of the stent.  Esophagram yesterday did show-small-volume esophageal leak from  the right distal end of the esophageal stent  Patient has been afebrile, no complaints of esophageal pain. Keeping n.p.o. and TPN being resumed  #2 new problem-bloody bowel movements.  This occurred on 1 occasion 2 days previous, and then 1 dark bloody bowel movement with clots today  Etiology is not clear but concerned she may be having some intermittent bleeding from the esophagus though no definite site noted at the time of EGD  #3 anemia-progressive multifactorial but in part due to GI blood loss  Plan; keep n.p.o. TPN Changed PPI to twice daily IV Trend hemoglobin every 6 hours, transfuse as indicated to keep hemoglobin above 7.5 Stop Lovenox  for now If she has continued evidence of drift in hemoglobin or bleeding we may need to consider repeat EGD GI will continue to follow    Principal Problem:   Lung cancer [facility]) Active Problems:   S/P lobectomy of lung   Protein-calorie malnutrition, severe     LOS: 19 days   Taisha Pennebaker PA-C 08/14/2024, 1:16 PM

## 2024-08-15 ENCOUNTER — Inpatient Hospital Stay (HOSPITAL_COMMUNITY)

## 2024-08-15 ENCOUNTER — Encounter (HOSPITAL_COMMUNITY): Payer: Self-pay | Admitting: Gastroenterology

## 2024-08-15 DIAGNOSIS — N179 Acute kidney failure, unspecified: Secondary | ICD-10-CM

## 2024-08-15 DIAGNOSIS — K921 Melena: Secondary | ICD-10-CM | POA: Diagnosis not present

## 2024-08-15 DIAGNOSIS — D6489 Other specified anemias: Secondary | ICD-10-CM | POA: Diagnosis not present

## 2024-08-15 DIAGNOSIS — Z902 Acquired absence of lung [part of]: Secondary | ICD-10-CM | POA: Diagnosis not present

## 2024-08-15 LAB — CBC
HCT: 23 % — ABNORMAL LOW (ref 36.0–46.0)
Hemoglobin: 7.7 g/dL — ABNORMAL LOW (ref 12.0–15.0)
MCH: 31.2 pg (ref 26.0–34.0)
MCHC: 33.5 g/dL (ref 30.0–36.0)
MCV: 93.1 fL (ref 80.0–100.0)
Platelets: 159 K/uL (ref 150–400)
RBC: 2.47 MIL/uL — ABNORMAL LOW (ref 3.87–5.11)
RDW: 14.7 % (ref 11.5–15.5)
WBC: 10.2 K/uL (ref 4.0–10.5)
nRBC: 0 % (ref 0.0–0.2)

## 2024-08-15 LAB — BASIC METABOLIC PANEL WITH GFR
Anion gap: 11 (ref 5–15)
Anion gap: 13 (ref 5–15)
BUN: 50 mg/dL — ABNORMAL HIGH (ref 8–23)
BUN: 50 mg/dL — ABNORMAL HIGH (ref 8–23)
CO2: 21 mmol/L — ABNORMAL LOW (ref 22–32)
CO2: 22 mmol/L (ref 22–32)
Calcium: 7.6 mg/dL — ABNORMAL LOW (ref 8.9–10.3)
Calcium: 8.1 mg/dL — ABNORMAL LOW (ref 8.9–10.3)
Chloride: 111 mmol/L (ref 98–111)
Chloride: 109 mmol/L (ref 98–111)
Creatinine, Ser: 2.55 mg/dL — ABNORMAL HIGH (ref 0.44–1.00)
Creatinine, Ser: 2.39 mg/dL — ABNORMAL HIGH (ref 0.44–1.00)
GFR, Estimated: 20 mL/min — ABNORMAL LOW
GFR, Estimated: 22 mL/min — ABNORMAL LOW
Glucose, Bld: 246 mg/dL — ABNORMAL HIGH (ref 70–99)
Glucose, Bld: 135 mg/dL — ABNORMAL HIGH (ref 70–99)
Potassium: 3.6 mmol/L (ref 3.5–5.1)
Potassium: 3.3 mmol/L — ABNORMAL LOW (ref 3.5–5.1)
Sodium: 142 mmol/L (ref 135–145)
Sodium: 144 mmol/L (ref 135–145)

## 2024-08-15 LAB — TYPE AND SCREEN
ABO/RH(D): A POS
Antibody Screen: NEGATIVE

## 2024-08-15 LAB — PROTIME-INR
INR: 1.3 — ABNORMAL HIGH (ref 0.8–1.2)
Prothrombin Time: 16.9 s — ABNORMAL HIGH (ref 11.4–15.2)

## 2024-08-15 LAB — HEMOGLOBIN AND HEMATOCRIT, BLOOD
HCT: 22.8 % — ABNORMAL LOW (ref 36.0–46.0)
HCT: 24.2 % — ABNORMAL LOW (ref 36.0–46.0)
Hemoglobin: 7.9 g/dL — ABNORMAL LOW (ref 12.0–15.0)
Hemoglobin: 8.1 g/dL — ABNORMAL LOW (ref 12.0–15.0)

## 2024-08-15 LAB — PHOSPHORUS: Phosphorus: 3.3 mg/dL (ref 2.5–4.6)

## 2024-08-15 LAB — GLUCOSE, CAPILLARY
Glucose-Capillary: 122 mg/dL — ABNORMAL HIGH (ref 70–99)
Glucose-Capillary: 127 mg/dL — ABNORMAL HIGH (ref 70–99)
Glucose-Capillary: 169 mg/dL — ABNORMAL HIGH (ref 70–99)
Glucose-Capillary: 174 mg/dL — ABNORMAL HIGH (ref 70–99)
Glucose-Capillary: 229 mg/dL — ABNORMAL HIGH (ref 70–99)

## 2024-08-15 LAB — MAGNESIUM: Magnesium: 2 mg/dL (ref 1.7–2.4)

## 2024-08-15 MED ORDER — HYDRALAZINE HCL 20 MG/ML IJ SOLN: 10.0000 mg | Freq: Four times a day (QID) | INTRAMUSCULAR | Status: DC | PRN

## 2024-08-15 MED ORDER — HYDRALAZINE HCL 20 MG/ML IJ SOLN
INTRAMUSCULAR | Status: AC
  Filled 2024-08-15: qty 1

## 2024-08-15 MED ORDER — FUROSEMIDE 10 MG/ML IJ SOLN
40.0000 mg | Freq: Once | INTRAMUSCULAR | Status: AC
  Administered 2024-08-15: 40 mg via INTRAVENOUS
  Filled 2024-08-15: qty 4

## 2024-08-15 MED ORDER — HYDRALAZINE HCL 20 MG/ML IJ SOLN
20.0000 mg | Freq: Four times a day (QID) | INTRAMUSCULAR | Status: DC | PRN
  Administered 2024-08-15 – 2024-08-16 (×3): 20 mg via INTRAVENOUS
  Filled 2024-08-15 (×2): qty 1

## 2024-08-15 MED ORDER — NITROGLYCERIN 0.3 MG/HR TD PT24
0.3000 mg | MEDICATED_PATCH | Freq: Every day | TRANSDERMAL | Status: AC
  Administered 2024-08-15 – 2024-09-03 (×20): 0.3 mg via TRANSDERMAL
  Filled 2024-08-15 (×20): qty 1

## 2024-08-15 MED ORDER — NITROGLYCERIN 0.3 MG/HR TD PT24: 0.3000 mg | MEDICATED_PATCH | Freq: Every day | TRANSDERMAL | Status: DC

## 2024-08-15 MED ORDER — HYDRALAZINE HCL 20 MG/ML IJ SOLN
10.0000 mg | Freq: Four times a day (QID) | INTRAMUSCULAR | Status: DC
  Administered 2024-08-15 – 2024-08-19 (×16): 10 mg via INTRAVENOUS
  Filled 2024-08-15 (×15): qty 1

## 2024-08-15 MED ORDER — POTASSIUM CHLORIDE 10 MEQ/50ML IV SOLN
10.0000 meq | INTRAVENOUS | Status: AC
  Administered 2024-08-15 (×3): 10 meq via INTRAVENOUS
  Filled 2024-08-15 (×4): qty 50

## 2024-08-15 MED ORDER — FUROSEMIDE 10 MG/ML IJ SOLN
20.0000 mg | Freq: Once | INTRAMUSCULAR | Status: AC
  Administered 2024-08-15: 20 mg via INTRAVENOUS
  Filled 2024-08-15: qty 2

## 2024-08-15 MED ORDER — LABETALOL HCL 5 MG/ML IV SOLN
20.0000 mg | Freq: Four times a day (QID) | INTRAVENOUS | Status: DC
  Administered 2024-08-15 – 2024-08-16 (×3): 20 mg via INTRAVENOUS
  Filled 2024-08-15 (×3): qty 4

## 2024-08-15 MED ORDER — TRAVASOL 10 % IV SOLN
INTRAVENOUS | Status: DC
  Filled 2024-08-15: qty 1008

## 2024-08-15 MED ORDER — IPRATROPIUM-ALBUTEROL 0.5-2.5 (3) MG/3ML IN SOLN
3.0000 mL | Freq: Four times a day (QID) | RESPIRATORY_TRACT | Status: DC
  Administered 2024-08-15 – 2024-08-22 (×29): 3 mL via RESPIRATORY_TRACT
  Filled 2024-08-15 (×30): qty 3

## 2024-08-15 NOTE — Progress Notes (Signed)
 Admit: 07/26/2024 LOS: 20  18F with AKI and persistent gross hematuria after right middle lobe lobectomy for malignancy with esophageal diverticulum status post resection complicated by leak requiring esophageal stenting with migration.   Subjective:  Back on TPN Receiving clonidine  and parenteral agents for blood pressure Creatinine 2.55 from 2.34 yesterday, K3.6. Yesterday had some lower GI bleeding, hemoglobin 7.9 this morning 0.3 L UOP documented in 24 hours Vancomycin  has been stopped, remains on Zosyn   01/17 0701 - 01/18 0700 In: 3225.5 [I.V.:2269.7; IV Piggyback:955.8] Out: 300 [Urine:300]  Filed Weights   08/09/24 0315 08/10/24 0430 08/15/24 0407  Weight: 71.1 kg 70 kg 74.8 kg    Scheduled Meds:  Chlorhexidine  Gluconate Cloth  6 each Topical Daily   cloNIDine   0.3 mg Transdermal Weekly   fentaNYL   1 patch Transdermal Q72H   hydrALAZINE   10 mg Intravenous Q6H   insulin  aspart  0-15 Units Subcutaneous Q6H   ipratropium-albuterol   3 mL Nebulization Q6H   metoprolol  tartrate  15 mg Intravenous Q6H   neomycin -bacitracin -polymyxin   Topical BID   nitroGLYCERIN   0.2 mg Transdermal Daily   pantoprazole  (PROTONIX ) IV  40 mg Intravenous Q12H   sodium chloride  flush  10-40 mL Intracatheter Q12H   Continuous Infusions:  sodium chloride  100 mL/hr at 08/15/24 0804   acetaminophen      fluconazole  (DIFLUCAN ) IV 400 mg (08/15/24 0912)   piperacillin -tazobactam (ZOSYN )  IV 12.5 mL/hr at 08/15/24 0804   promethazine  (PHENERGAN ) injection (IM or IVPB) Stopped (08/14/24 2051)   TPN ADULT (ION) 75 mL/hr at 08/15/24 0804   TPN ADULT (ION)     PRN Meds:.bisacodyl , diphenhydrAMINE , hydrALAZINE , HYDROmorphone  (DILAUDID ) injection, levalbuterol , lidocaine -prilocaine , lip balm, LORazepam , ondansetron  (ZOFRAN ) IV, mouth rinse, phenol, promethazine  (PHENERGAN ) injection (IM or IVPB), sodium chloride  flush  Current Labs: reviewed    Physical Exam:  Blood pressure (!) 180/71, pulse 82,  temperature 98.9 F (37.2 C), temperature source Oral, resp. rate 18, height 5' 3 (1.6 m), weight 74.8 kg, last menstrual period 10/26/1998, SpO2 98%. GEN: NAD, lying in bed, resting ENT: NCAT EYES: EOMI CV: Regular, normal S1 and S2 PULM: Clear bilaterally, normal work of breathing ABD: Soft, nontender SKIN: No rashes, no petechia/purpura EXT: No peripheral edema  A Nonoliguric AKI with persistently hematuric urine:  Potential etiologies include ATN from volume shifts, potentially antibiotic exposures including vancomycin  and beta-lactam's.   Unsure what to make of gross hematuria.  Recent imaging with small stones but unlikely to be the cause of this, no other findings at that time.   It would be unusual to have an active GN at this point; perhaps infection related  With her current surgical infectious issues, not something that could be treated with immunosuppression even if confirmed.   Serologies ordered and pending currently Receiving TPN and crystalloid at the current time No indication for RRT Gross hematuria since presentation: Largely as above.  Unclear if glomerular or not.  If serologies negative and if/when GFR permits likely would benefit from CT urogram and consideration of cystoscopy with urology. Hypertension, not able to take p.o.  On IV hydralazine , metoprolol  and TTS clonidine .  Trend.  P Await serologies Continue supportive care Discussed with Dr. Daniel Medication Issues; Preferred narcotic agents for pain control are hydromorphone , fentanyl , and methadone. Morphine  should not be used.  Baclofen should be avoided Avoid oral sodium phosphate  and magnesium  citrate based laxatives / bowel preps    Bernardino Gasman MD 08/15/2024, 10:24 AM  Recent Labs  Lab 08/09/24 0350 08/11/24 0840  08/13/24 1000 08/14/24 0500 08/15/24 0050  NA 137   < > 137 140 142  K 4.1   < > 3.9 4.0 3.6  CL 104   < > 105 107 111  CO2 25   < > 21* 18* 21*  GLUCOSE 132*   < > 93 142* 246*   BUN 26*   < > 30* 39* 50*  CREATININE 0.78   < > 2.01* 2.34* 2.55*  CALCIUM 8.4*   < > 8.1* 8.0* 7.6*  PHOS 3.1  --   --   --  3.3   < > = values in this interval not displayed.   Recent Labs  Lab 08/14/24 1325 08/14/24 1923 08/15/24 0050 08/15/24 0539  WBC 10.8* 11.4* 10.2  --   NEUTROABS 8.4*  --   --   --   HGB 9.5* 8.3* 7.7* 7.9*  HCT 27.9* 25.0* 23.0* 22.8*  MCV 93.3 93.6 93.1  --   PLT 198 172 159  --

## 2024-08-15 NOTE — Progress Notes (Signed)
" ° °   °  [address] [place] [address]             [phone]    Got a call from the RN reporting the patient's systolic blood pressure was 190s-200 and the patient was having squeezing chest pain and difficulty breathing as well as sinus tachycardia into the 110s. STAT CXR showed worsening pulmonary edema. She was saturating low 90s on 2L [place]. EKG showed NSR with nonspecific ST, T wave abnormality stable from her last EKG. She got a breathing treatment and a dose of IV lasix  40mg . PCCM saw the patient bedside and recommended a total 80-120mg  of Lasix . As discussed with Dr. Daniel will start with 60mg  IV Lasix  due to AKI with creatinine 2.55. After IV Lasix  40mg  the RN reports her breathing improved, BP improved to 177/84 and sinus tachycardia resolved with patient in NSR, HR 80s. RN reports total of about 500cc UO today, without gross hematuria. She also has had 2 loose stools but neither were bloody. H/H has improved to 8.1/24.2. Dr. Daniel also recommended holding TPN for now, I called pharmacy to make them aware.  Con GORMAN [place], PA-C    "

## 2024-08-15 NOTE — Progress Notes (Addendum)
 Patient ID: [name], female   DOB: 1956/04/11, 69 y.o.   MRN: [id number]    Progress Note   Subjective   Day # 20 CC; non-small cell lung cancer admitted for RML lobectomy and resection of esophageal diverticulum/lymph node dissection 07/26/2024 Esophageal perforation/leak postop  N.p.o. TPN Zosyn  Chest tube -right  Tmax 98 9  EGD 1/16-removal of previously placed esophageal stent from the stomach requiring multiple attempts, then placement of new 23 mm x 12.5 cm wall flex covered stent.  At the end this was about 3 cm above the GE junction and imaging confirmed that it was covering the diverticuli to me site.  Stent was secured with 3 hemoclips at the proximal end.  She was noted to have 1 cratered esophageal ulcer at 30 cm which was about 20 mm in diameter  Esophagram yesterday confirmed a small leak from the right distal end of stent  Episode of bloody bowel movement 2 days ago, then x 1 yesterday  Labs today-potassium 3.6/glucose 246/BUN 50/creatinine 2.55-rising WBC 10.2 Hemoglobin 7.9/hematocrit 22.8-down from hemoglobin of 10.6>9.5 yesterday  Patient upset and anxious about possible blood transfusion, had been informed about worsening renal function.  She continues to ask for liquids No abdominal pain No further bowel movements or bleeding     Objective   Vital signs in last 24 hours: Temp:  [97.8 F (36.6 C)-98.9 F (37.2 C)] 98.9 F (37.2 C) (01/18 0755) Pulse Rate:  [67-82] 82 (01/18 0755) Resp:  [15-22] 18 (01/18 0755) BP: (164-192)/(63-80) 180/71 (01/18 0755) SpO2:  [91 %-100 %] 91 % (01/18 0755) Weight:  [74.8 kg] 74.8 kg (01/18 0407) Last BM Date : 08/14/24 General:   Older white female in NAD, pleasant, family in room Heart:  Regular rate and rhythm; no murmurs Lungs: Respirations even and unlabored, chest tube present on the right Abdomen:  Soft, nontender and nondistended. Normal bowel sounds. Extremities:  Without edema. Neurologic:  Alert and  oriented,  grossly normal neurologically. Psych:  Cooperative. Normal mood and affect.  Intake/Output from previous day: 01/17 0701 - 01/18 0700 In: 3225.5 [I.V.:2269.7; IV Piggyback:955.8] Out: 300 [Urine:300] Intake/Output this shift: Total I/O In: 1061.1 [I.V.:935.4; IV Piggyback:125.7] Out: -   Lab Results: Recent Labs    08/14/24 1325 08/14/24 1923 08/15/24 0050 08/15/24 0539  WBC 10.8* 11.4* 10.2  --   HGB 9.5* 8.3* 7.7* 7.9*  HCT 27.9* 25.0* 23.0* 22.8*  PLT 198 172 159  --    BMET Recent Labs    08/13/24 1000 08/14/24 0500 08/15/24 0050  NA 137 140 142  K 3.9 4.0 3.6  CL 105 107 111  CO2 21* 18* 21*  GLUCOSE 93 142* 246*  BUN 30* 39* 50*  CREATININE 2.01* 2.34* 2.55*  CALCIUM 8.1* 8.0* 7.6*   LFT Recent Labs    08/12/24 1500  PROT 4.4*  ALBUMIN  2.3*  AST 20  ALT 12  ALKPHOS 44  BILITOT 0.3   PT/INR Recent Labs    08/14/24 2248 08/15/24 0050  LABPROT 16.7* 16.9*  INR 1.3* 1.3*    Studies/Results: DG Chest Port 1 View Result Date: 08/15/2024 EXAM: 1 VIEW(S) XRAY OF THE CHEST 08/15/2024 06:00:00 AM COMPARISON: 08/14/2024 CLINICAL HISTORY: Pleural effusion FINDINGS: LINES, TUBES AND DEVICES: Proximal to mid esophageal stent stable in place. Right PICC stable in place. LUNGS AND PLEURA: Right lung post-surgical changes. Similar right perihilar opacities. Small right pleural effusion, unchanged. Mild pulmonary edema. No pneumothorax. HEART AND MEDIASTINUM: No acute abnormality of the cardiac  and mediastinal silhouettes. BONES AND SOFT TISSUES: No acute osseous abnormality. IMPRESSION: 1. Small right pleural effusion, unchanged. 2. Mild pulmonary edema. Electronically signed by: Waddell Calk MD 08/15/2024 07:16 AM EST RP Workstation: [id number]   DG ESOPHAGUS W SINGLE CM (SOL OR THIN BA) Result Date: 08/14/2024 CLINICAL DATA:  69 year old female with history of a right mid lobe lobectomy and midthoracic esophagus diverticula/matted resection in  07/26/2024 which was complicated persistent esophageal perforation. Patient is status post esophageal stent placement on 08/04/2024 which migrated into the stomach, she underwent old stent removal in a new esophageal stent placement on 08/13/2024. EXAM: WATER SOLUBLE ESOPHAGRAM TECHNIQUE: Single contrast examination was performed using 100 mL of water-soluble contrast. This exam was performed by Toya Cousin, PA-C, and was supervised and interpreted by Jackquline Boxer, MD. FLUOROSCOPY: Radiation Exposure Index (as provided by the fluoroscopic device): 22.0 mGy Kerma COMPARISON:  One view CXR on 08/13/2024; CT chest abdomen pelvis without contrast on 08/12/2024; water-soluble esophagram on 08/09/2024; water-soluble esophagus on 07/27/2024. FINDINGS: Scout image shows a metal esophageal stent from the proximal thoracic esophagus to the mid thoracic esophagus. Surgical clips noted at the proximal end of the stent. Consolidation in the right middle lobe with surgical staples compatible with patient's history of right middle lobe lobectomy. Focused examination of the midthoracic esophagus with water soluble contrast was performed. There is a thin line of extraluminal contrast which develops from the distal RIGHT end of the stent. The external contrast slowly increases in volume over subsequent swallows. This volume of extraluminal contrast small but is consistent with esophageal leak. This is new from the water-soluble esophagram performed on 08/09/2024. The amount of extravasation is decreased when is compared to the esophagram performed on 06/30/2024. IMPRESSION: Small volume esophageal leak originating from the right distal end of esophageal stent. These results will be called to the ordering clinician or representative by the Radiologist Assistant, and communication documented in the PACS or [employer]. Electronically Signed   By: Jackquline Boxer M.D.   On: 08/14/2024 11:10   DG CHEST PORT 1 VIEW Result Date:  08/14/2024 EXAM: 1 VIEW(S) XRAY OF THE CHEST 08/14/2024 06:40:00 AM COMPARISON: Chest CT without contrast from 08/12/2024, portable chest 08/13/2024. CLINICAL HISTORY: Pleural effusion. FINDINGS: LINES, TUBES AND DEVICES: There is a right chest tube in place with the tip superimposing in the right lower paratracheal area. There is a right PICC with the tip in the distal SVC. There is a stent in the upper to mid thoracic esophagus. A stent was previously in the stomach, which has either been pulled back into the esophagus or removed, or could be below the field of view. LUNGS AND PLEURA: There is a small right pleural effusion. Patchy hazy opacity in the right mid and lower lung fields. Post-treatment / postsurgical changes along the right hilum. Left lung is clear. Opacities are increasing in the right mid lung with otherwise stable aeration. No measurable pneumothorax. HEART AND MEDIASTINUM: No acute abnormality of the cardiac and mediastinal silhouettes. BONES AND SOFT TISSUES: No acute osseous abnormality. IMPRESSION: 1. Small right pleural effusion. 2. Patchy hazy opacity in the right mid and lower lung fields, increasing in the right mid lung, with otherwise stable aeration. 3. Post-treatment/postsurgical changes along the right hilum 4. Esophageal stent. Electronically signed by: Francis Quam MD 08/14/2024 08:01 AM EST RP Workstation: [id number]   DG C-Arm 1-60 Min Result Date: 08/13/2024 EXAM: FLUOROSCOPIC IMAGING TECHNIQUE: Fluoroscopy was provided by the radiology department for procedure. Radiologist was not present during  examination. Fluoroscopy time 2 minutes 47 seconds. RADIATION DOSE INDEX: Reference Air Kerma: 23.93 mGy COMPARISON: None available. CLINICAL HISTORY: esophageal leak. FINDINGS: Intraoperative fluoroscopic imaging was performed. A second flared esophageal stent was placed in a satisfactory position. Hemostatic clips were noted in the proximal esophagus as well. By history, a migrated  stent in the stomach was removed endoscopically prior to this procedure. IMPRESSION: 1. Intraoperative fluoroscopic imaging as above. Please refer to the operative report for full details. Electronically signed by: Oneil Devonshire MD 08/13/2024 07:37 PM EST RP Workstation: [id number] C-Arm 1-60 Min-No Report Result Date: 08/13/2024 Fluoroscopy was utilized by the requesting physician.  No radiographic interpretation.       Assessment / Plan:    #58  69 year old female with non-small cell lung cancer status post right middle lobe lobectomy 07/26/2024.  Resection of esophageal diverticulum done at the time of surgery. She was noted to have esophageal leak at the midesophagus the day after the surgery, She had initial covered esophageal stent placed by cardiovascular surgery, then repositioning a few days later due to persistent leak.  Underwent EGD 08/14/2023/Dr. Rollin with removal of the previous placed stent which had migrated into the stomach, required multiple attempts to grasp.  Found to have a cratered ulcer at 30 cm from the incisors, measured 20 mm in largest dimension, no active bleeding.  This was stented with a 23 mm x 12.5 cm wall flex covered stent.  The diverticuli to me site was occluded with stent, and 3 hemoclips were placed in the proximal portion of the stent to prevent migration.  Unfortunately esophagram yesterday did show a small volume leak from the right distal end of the esophageal stent She has been n.p.o. this weekend, TPN resumed  #2 bloody bowel movements-had 1 episode 3 days ago and 1 episode yesterday.  Has had continued slow downtrend in hemoglobin to 7.9 today No further active bleeding  Source of the GI bleeding is not clear though concerned she may be having some intermittent bleeding from the esophagus  #3 anemia-progressive/multifactoral but in part due to GI blood loss  #4 acute kidney injury-neurology consulted today  #5 chest tube right/small leak in  the bronchus intermedius on bronchoscopy   Plan; continue to trend hemoglobin, transfuse as indicated to keep her hemoglobin 7.5 or above IV PPI twice daily N.p.o. Continue TPN Continue IV Zosyn   Will place orders for EGD and stent repositioning for Dr. Rollin tomorrow, but will defer to Dr. Rollin and Dr. Kerrin to make decision in AM.  Patient aware    Principal Problem:   Lung cancer [facility]) Active Problems:   S/P lobectomy of lung   Protein-calorie malnutrition, severe     LOS: 20 days   Amy Esterwood PA-C 08/15/2024, 8:25 AM     Attending physician's note   I have taken history, reviewed the chart and examined the patient. I performed a substantive portion of this encounter, including complete performance of at least one of the key components, in conjunction with the APP. I agree with the Advanced Practitioner's note, impression and recommendations.   Cross cover for Dr. Rollin  Persistent esophageal leak at lower end of the esophageal stent placed yesterday UGI bleed likely d/t esophageal ulcer (appears to have resolved) AKI-appreciate renal consultation  Plan: - NPO. - IV PPI/IV Zosyn  - Continue TPN - Trend CBC. Agree with transfusion to keep Hb>7.5  We will discuss with Dr. Rollin regarding plans to ? reposition stent in AM or  insertion of new stent with in existing stent.    Anselm Bring, MD [facility] [phone]

## 2024-08-15 NOTE — Progress Notes (Addendum)
 "     [address] [place] [address]             [phone]      2 Days Post-Op Procedures (LRB): EGD (ESOPHAGOGASTRODUODENOSCOPY) (N/A) BRONCHOSCOPY, FLEXIBLE STENT REMOVAL INSERTION, STENT, ESOPHAGUS (N/A) CONTROL OF HEMORRHAGE, GI TRACT, ENDOSCOPIC, BY CLIPPING OR OVERSEWING IRRIGATION, BRONCHUS Subjective: Patient reports she has some pain this AM mainly around her chest tube site and right sided chest. She reports her urine was a little clearer this AM and she has not had any more blood bowel movements.   Objective: Vital signs in last 24 hours: Temp:  [97.8 F (36.6 C)-98.6 F (37 C)] 98.5 F (36.9 C) (01/18 0402) Pulse Rate:  [67-86] 78 (01/17 1919) Cardiac Rhythm: Normal sinus rhythm (01/17 1907) Resp:  [15-22] 19 (01/18 0402) BP: (158-192)/(63-80) 192/80 (01/18 0402) SpO2:  [94 %-100 %] 97 % (01/18 0402) Weight:  [74.8 kg] 74.8 kg (01/18 0407)  Hemodynamic parameters for last 24 hours:    Intake/Output from previous day: 01/17 0701 - 01/18 0700 In: 3225.5 [I.V.:2269.7; IV Piggyback:955.8] Out: 300 [Urine:300] Intake/Output this shift: No intake/output data recorded.  General appearance: alert, cooperative, and no distress Neurologic: intact Heart: regular rate and rhythm, S1, S2 normal, no murmur, click, rub or gallop Lungs: slightly diminished right base but much improved air movement than yesterday Abdomen: soft, non-tender; bowel sounds normal; no masses,  no organomegaly Extremities: extremities normal, atraumatic, no cyanosis or edema Wound: Clean and dry without sign of infection  Lab Results: Recent Labs    08/14/24 1923 08/15/24 0050 08/15/24 0539  WBC 11.4* 10.2  --   HGB 8.3* 7.7* 7.9*  HCT 25.0* 23.0* 22.8*  PLT 172 159  --    BMET:  Recent Labs    08/14/24 0500 08/15/24 0050  NA 140 142  K 4.0 3.6  CL 107 111  CO2 18* 21*  GLUCOSE 142* 246*  BUN 39* 50*  CREATININE 2.34* 2.55*  CALCIUM 8.0* 7.6*     PT/INR:  Recent Labs    08/15/24 0050  LABPROT 16.9*  INR 1.3*   ABG    Component Value Date/Time   TCO2 25 11/10/2009 0952   CBG (last 3)  Recent Labs    08/14/24 2314 08/15/24 0035 08/15/24 0633  GLUCAP 225* 229* 169*    Assessment/Plan: S/P Procedures (LRB): EGD (ESOPHAGOGASTRODUODENOSCOPY) (N/A) BRONCHOSCOPY, FLEXIBLE STENT REMOVAL INSERTION, STENT, ESOPHAGUS (N/A) CONTROL OF HEMORRHAGE, GI TRACT, ENDOSCOPIC, BY CLIPPING OR OVERSEWING IRRIGATION, BRONCHUS  Neuro: Anxiety, on ativan . Pain, continue IV Tylenol , continue fentanyl  patch, and dilaudid  prn.    CV: Hypertension, continue clonidine  patch, nitro patch, IV Lopressor  and hydralazine  prn. BP this AM 192/80, was given hydralazine . SBP mostly 150s-160s yesterday. Will get follow up BP. Need better BP control today, will add scheduled hydralazine  as discussed with Dr. Daniel. NSR, HR 70s-80s.    Pulm: Bronchoscopy showed a small hole in the bronchus intermedius with secretions in the area. Saturating well on RA. CT in place with no output over 24 hrs. CXR with overall stable post surgical changes along the right hilum, patchy opacities and aeration look improved this AM. Stent is in the upper to mid thoracic esophagus. Continue nebs. Encourage IS and ambulation.   GI: Esophageal leak following esophageal diverticulum resection. New esophageal stent placed over 20mm esophageal ulcer by GI and clipped into place due to migration to the stomach of the last stent.  Esophagram showed a  small leak at the distal end of the stent. I discussed with GI and they do not have anyone in house to reposition the stent this weekend, they report they do not want to do an EGD on her this weekend unless it is emergent. Patient also with bloody bowel movement x 1 yesterday, incomplete GI PA note reports about 200cc of dark red blood with clots seen in the commode. GI is following with serial H/H and recommended transfusing if H/H<7. Slightly  elevated INR 1.3 and PTT 42. Fibrinogen  385. Will need to discuss with surgeon and GI tomorrow whether they can reposition stent this week. She remains NPO and TPN has been restarted.    Renal: AKI, Cr 2.34>>2.55 this AM. Nephrology has been consulted and is following. Serologies have been sent, hoping AKI plateaus. Vancomycin  has been d/c'd. Patient continues to have amber urine but looked a little lighter this AM, large hemoglobin on UA yesterday, trace leukocytes, and many bacteria. If labwork is unremarkable may need to consider renal biopsy per nephrology. Holding off on imaging due to worsening AKI. On IV fluids at 167mL/hr. UO 300cc/24hrs recorded. On Diflucan  and Zosyn .   ID: Leukocytosis resolved. Tmax 98.8, no fevers in about 48 hours. Blood cultures with no growth x 3 days. Respiratory culture with few normal respiratory flora seen and BAL with no growth to date. On Diflucan  and Zosyn , vancomycin  has been d/c'd due to AKI.    Expected postop ABLA now with likely GI bleed: H/H 10.6/31.4>>8.3/25>>7.7/23>>7.9/22.8, trending down but stabilized this AM. May need transfusion, will discuss with surgeon.    DVT Prophylaxis: Lovenox  d/c'd due to Gi bleed   Dispo: Continue to trend H/H, luckily it has stabilized this AM and no further bloody BM although she does continue to have hematuria. GI and nephrology are following as well as critical care. Will monitor H/H closely today and work on improving BP.   LOS: 20 days    Con GORMAN Bend, PA-C 08/15/2024  Large bloody BM yesterday. Lovenox  stopped, hemoglobin trended down but has stabilized at 7.7-7.9 today.  No additional bloody bowel movements and hematuria is also resolving as well.  Urine in the bedside commode this morning looks essentially normal.  OK to have mouth swabs for comfort, continue TPN, will check another H&H tonight just to make sure it remains stable.  Hopefully GI can re-position the stent tomorrow.  Con Clunes,  MD Cardiothoracic Surgery Pager: [phone]  "

## 2024-08-15 NOTE — Progress Notes (Signed)
 PHARMACY - TOTAL PARENTERAL NUTRITION CONSULT NOTE  Indication: Esophageal leak  Patient Measurements: Height: 5' 3 (160 cm) Weight: 74.8 kg (164 lb 14.5 oz) IBW/kg (Calculated) : 52.4 TPN AdjBW (KG): 57.4 Body mass index is 29.21 kg/m.  Assessment:  8 YOF with NSCLC here for RML lobectomy, resection of esophageal diverticulum, and lymph node dissection on 12/29.  Found to have a small contained leak post-op.  Planning to repeat esophagram on 07/30/24. Patient reports an intentional weight loss of 40 lbs over a couple of years when she was diagnosed with DM.  She typically drinks one Orgain shake every morning for breakfast and eats one meal a day, which could be pizza, spaghetti, chicken, beef or fish along with vegetables and fruits.  Patient was eating normally until her planned surgery. RD concerned with patient starving herself. Patient was on TPN 12/31 - 1/12 and patient was advanced to dysphagia 1 diet. However, patient was NPO again 1/14-1/16. On 1/17 IR reporting small amount of leak from distal esophageal stent. Patient developed AKI since TPN was held. Pharmacy reconsulted for TPN 1/17.  Glucose / Insulin : hx DM, A1c 5.9% - BG 127-246, gave dexamethaxone 1/16, used 12 units SSI/24hr Electrolytes: Na 142 up, K 3.6, CO2 21 (max acet in TPN), CoCa 8.9, others wnl  Renal: SCr 2.55 (BL 0.8), BUN 50 Hepatic: LFTs / tbili WNL, albumin  2.9, TG 172 Intake / Output; MIVF: NS @100ml /hr; UOP charted, LBM 1/17, chest tube 0 mL.    GI Imaging:   12/30 esophagram: Positive for contrast extravasation and an esophageal leak in the mid esophagus 1/2 esophagram: Persistent contrast extravasation and esophageal leak, similar to prior 1/6 esophagram: Limited contrast study due to patient aspiration and severe coughing spell. Study aborted. 1/12 esophagram: Patent esophageal stent. No evidence for extravasation or leak, Delayed esophageal emptying and gastroesophageal reflux leading to stasis  of contrast in the esophagus. 1/15 CT: displaced esophageal stent in stomach, no bowel obstruction  1/17 esophagram: small volume esophageal leak at distal stent GI Surgeries / Procedures:  12/30 right middle lobectomy, resection esophageal diverticulum, lymph node biopsy  1/7 EGD- stent placement  1/9 EGD stent repositioned d/t migration to stomach  1/16 EGD- stent placed at esophageal ulcer, removed gastric stent, normal duoenum   Central access: PICC 07/28/24 TPN start date: 07/28/24> 1/12; 1/17 >>   Nutritional Goals:  Goal TPN rate 75 ml/hr to provide 101g AA and 1721 kCal  RD Estimated Needs Total Energy Estimated Needs: 1700-1900 Total Protein Estimated Needs: 100-115g Total Fluid Estimated Needs: >1.7L/day  Current Nutrition:  TPN  1/08 CLD - passed swallow 1/09 CLD - patient feels can't swallow well > NPO 1/13 advanced to DYS1 1/14- NPO   Plan: Continue TPN at goal 75 mL/hr at 1800, meeting 100% estimated needs  Electrolytes in TPN: decrease Na 75 mEq/L, increase K 35 mEq/L, Ca 3 mEq/L, Mg 3 mEq/L, and Phos 5 mmol/L. Cl:Ac max acet  Add standard MVI and trace elements to TPN Continue Moderate q6h SSI and adjust as needed   Additional fluids per team  Monitor TPN labs on Mon/Thurs, and PRN   Thank you for allowing pharmacy to be a part of this patients care.  Jinnie Door, PharmD, BCPS, BCCP Clinical Pharmacist  Please check AMION for all [facility] Pharmacy phone numbers After 10:00 PM, call Main Pharmacy [phone]

## 2024-08-15 NOTE — Anesthesia Preprocedure Evaluation (Signed)
"                                    Anesthesia Evaluation  Patient identified by MRN, date of birth, ID band Patient awake    Reviewed: Allergy & Precautions, NPO status , Patient's Chart, lab work & pertinent test results  Airway Mallampati: II  TM Distance: >3 FB Neck ROM: Full    Dental  (+) Teeth Intact, Dental Advisory Given   Pulmonary sleep apnea , former smoker   Pulmonary exam normal breath sounds clear to auscultation       Cardiovascular hypertension, Normal cardiovascular exam+ dysrhythmias Supra Ventricular Tachycardia  Rhythm:Regular Rate:Normal     Neuro/Psych  PSYCHIATRIC DISORDERS Anxiety Depression    negative neurological ROS     GI/Hepatic negative GI ROS, Neg liver ROS,,,esophageal leak  post esophageal stent placement   Endo/Other  diabetes, Type 2    Renal/GU Renal disease     Musculoskeletal negative musculoskeletal ROS (+)    Abdominal   Peds  Hematology  (+) Blood dyscrasia, anemia   Anesthesia Other Findings   Reproductive/Obstetrics                              Anesthesia Physical Anesthesia Plan  ASA: 2  Anesthesia Plan: General   Post-op Pain Management:    Induction: Intravenous  PONV Risk Score and Plan: 3 and Dexamethasone  and Ondansetron   Airway Management Planned: Oral ETT  Additional Equipment:   Intra-op Plan:   Post-operative Plan: Extubation in OR  Informed Consent: I have reviewed the patients History and Physical, chart, labs and discussed the procedure including the risks, benefits and alternatives for the proposed anesthesia with the patient or authorized representative who has indicated his/her understanding and acceptance.     Dental advisory given  Plan Discussed with: CRNA  Anesthesia Plan Comments:          Anesthesia Quick Evaluation  "

## 2024-08-15 NOTE — Progress Notes (Signed)
" ° ° °  PROCEDURAL EXPEDITER PROGRESS NOTE  Patient Name: [name]  DOB:[birth year] Date of Admission: 07/26/2024  Date of Assessment:08/15/24   -------------------------------------------------------------------------------------------------------------------   Brief clinical summary: Pt to Endo tomorrow for an upper endoscopy   Orders in place:  Yes   Labs, test, and orders reviewed: Y  Requires surgical clearance:  No  Barriers noted: N/A  -------------------------------------------------------------------------------------------------------------------  [facility], [place], [place] Please contact us  directly via secure chat (search for [facility]) or by calling us  at [phone] [facility] [place]).  "

## 2024-08-16 ENCOUNTER — Encounter (HOSPITAL_COMMUNITY): Payer: Self-pay | Admitting: Thoracic Surgery (Cardiothoracic Vascular Surgery)

## 2024-08-16 ENCOUNTER — Inpatient Hospital Stay (HOSPITAL_COMMUNITY)

## 2024-08-16 ENCOUNTER — Inpatient Hospital Stay (HOSPITAL_COMMUNITY): Admitting: Anesthesiology

## 2024-08-16 ENCOUNTER — Encounter (HOSPITAL_COMMUNITY): Admission: RE | Payer: Self-pay | Source: Home / Self Care

## 2024-08-16 DIAGNOSIS — K259 Gastric ulcer, unspecified as acute or chronic, without hemorrhage or perforation: Secondary | ICD-10-CM | POA: Diagnosis not present

## 2024-08-16 HISTORY — PX: ESOPHAGOGASTRODUODENOSCOPY: SHX5428

## 2024-08-16 HISTORY — PX: HEMOSTASIS CLIP PLACEMENT: SHX6857

## 2024-08-16 LAB — COMPREHENSIVE METABOLIC PANEL WITH GFR
ALT: 16 U/L (ref 0–44)
AST: 25 U/L (ref 15–41)
Albumin: 2.8 g/dL — ABNORMAL LOW (ref 3.5–5.0)
Alkaline Phosphatase: 51 U/L (ref 38–126)
Anion gap: 10 (ref 5–15)
BUN: 46 mg/dL — ABNORMAL HIGH (ref 8–23)
CO2: 25 mmol/L (ref 22–32)
Calcium: 8.2 mg/dL — ABNORMAL LOW (ref 8.9–10.3)
Chloride: 107 mmol/L (ref 98–111)
Creatinine, Ser: 2.55 mg/dL — ABNORMAL HIGH (ref 0.44–1.00)
GFR, Estimated: 20 mL/min — ABNORMAL LOW
Glucose, Bld: 122 mg/dL — ABNORMAL HIGH (ref 70–99)
Potassium: 3.5 mmol/L (ref 3.5–5.1)
Sodium: 142 mmol/L (ref 135–145)
Total Bilirubin: 0.4 mg/dL (ref 0.0–1.2)
Total Protein: 5.4 g/dL — ABNORMAL LOW (ref 6.5–8.1)

## 2024-08-16 LAB — MAGNESIUM: Magnesium: 1.7 mg/dL (ref 1.7–2.4)

## 2024-08-16 LAB — CBC
HCT: 27.5 % — ABNORMAL LOW (ref 36.0–46.0)
Hemoglobin: 9.1 g/dL — ABNORMAL LOW (ref 12.0–15.0)
MCH: 30.4 pg (ref 26.0–34.0)
MCHC: 33.1 g/dL (ref 30.0–36.0)
MCV: 92 fL (ref 80.0–100.0)
Platelets: 157 K/uL (ref 150–400)
RBC: 2.99 MIL/uL — ABNORMAL LOW (ref 3.87–5.11)
RDW: 15.2 % (ref 11.5–15.5)
WBC: 12.2 K/uL — ABNORMAL HIGH (ref 4.0–10.5)
nRBC: 0 % (ref 0.0–0.2)

## 2024-08-16 LAB — ANCA PROFILE
Anti-MPO Antibodies: <0.2 U (ref 0.0–0.9)
Anti-PR3 Antibodies: <0.2 U (ref 0.0–0.9)
Atypical P-ANCA titer: <1:20 {titer}
C-ANCA: <1:20 {titer}
P-ANCA: <1:20 {titer}

## 2024-08-16 LAB — RHEUMATOID FACTOR: Rheumatoid fact SerPl-aCnc: 17.6 [IU]/mL — ABNORMAL HIGH

## 2024-08-16 LAB — PHOSPHORUS: Phosphorus: 2.2 mg/dL — ABNORMAL LOW (ref 2.5–4.6)

## 2024-08-16 LAB — TRIGLYCERIDES: Triglycerides: 211 mg/dL — ABNORMAL HIGH

## 2024-08-16 LAB — C3 COMPLEMENT: C3 Complement: 120 mg/dL (ref 82–167)

## 2024-08-16 LAB — GLOMERULAR BASEMENT MEMBRANE ANTIBODIES: GBM Ab: <0.2 U (ref 0.0–0.9)

## 2024-08-16 LAB — ANA W/REFLEX IF POSITIVE: Anti Nuclear Antibody (ANA): NEGATIVE

## 2024-08-16 LAB — GLUCOSE, CAPILLARY
Glucose-Capillary: 142 mg/dL — ABNORMAL HIGH (ref 70–99)
Glucose-Capillary: 154 mg/dL — ABNORMAL HIGH (ref 70–99)

## 2024-08-16 LAB — C4 COMPLEMENT: Complement C4, Body Fluid: 26 mg/dL (ref 12–38)

## 2024-08-16 MED ORDER — MIDAZOLAM HCL (PF) 2 MG/2ML IJ SOLN
INTRAMUSCULAR | Status: DC | PRN
  Administered 2024-08-16 (×2): 1 mg via INTRAVENOUS

## 2024-08-16 MED ORDER — SODIUM CHLORIDE 0.9 % IV SOLN
INTRAVENOUS | Status: AC | PRN
  Administered 2024-08-16: 500 mL via INTRAVENOUS

## 2024-08-16 MED ORDER — NICARDIPINE HCL IN NACL 20-0.86 MG/200ML-% IV SOLN
3.0000 mg/h | INTRAVENOUS | Status: DC
  Administered 2024-08-16 (×3): 5 mg/h via INTRAVENOUS
  Administered 2024-08-17: 2.5 mg/h via INTRAVENOUS
  Administered 2024-08-17 – 2024-08-18 (×7): 5 mg/h via INTRAVENOUS
  Filled 2024-08-16 (×11): qty 200

## 2024-08-16 MED ORDER — MAGNESIUM SULFATE 2 GM/50ML IV SOLN
2.0000 g | Freq: Once | INTRAVENOUS | Status: AC
  Administered 2024-08-16: 2 g via INTRAVENOUS
  Filled 2024-08-16: qty 50

## 2024-08-16 MED ORDER — DEXAMETHASONE SOD PHOSPHATE PF 10 MG/ML IJ SOLN
INTRAMUSCULAR | Status: DC | PRN
  Administered 2024-08-16: 10 mg via INTRAVENOUS

## 2024-08-16 MED ORDER — FENTANYL CITRATE (PF) 100 MCG/2ML IJ SOLN
INTRAMUSCULAR | Status: AC
  Filled 2024-08-16: qty 2

## 2024-08-16 MED ORDER — POTASSIUM CHLORIDE 10 MEQ/50ML IV SOLN
10.0000 meq | INTRAVENOUS | Status: AC
  Administered 2024-08-16 (×2): 10 meq via INTRAVENOUS
  Filled 2024-08-16 (×2): qty 50

## 2024-08-16 MED ORDER — LABETALOL HCL 5 MG/ML IV SOLN
10.0000 mg | Freq: Once | INTRAVENOUS | Status: AC
  Administered 2024-08-16: 10 mg via INTRAVENOUS

## 2024-08-16 MED ORDER — TRAVASOL 10 % IV SOLN
INTRAVENOUS | Status: AC
  Filled 2024-08-16: qty 1008

## 2024-08-16 MED ORDER — POTASSIUM PHOSPHATES 15 MMOLE/5ML IV SOLN
30.0000 mmol | Freq: Once | INTRAVENOUS | Status: AC
  Administered 2024-08-16: 30 mmol via INTRAVENOUS
  Filled 2024-08-16 (×2): qty 10

## 2024-08-16 MED ORDER — SUCCINYLCHOLINE CHLORIDE 200 MG/10ML IV SOSY
PREFILLED_SYRINGE | INTRAVENOUS | Status: DC | PRN
  Administered 2024-08-16: 100 mg via INTRAVENOUS

## 2024-08-16 MED ORDER — FUROSEMIDE 10 MG/ML IJ SOLN
60.0000 mg | Freq: Once | INTRAMUSCULAR | Status: AC
  Administered 2024-08-16: 60 mg via INTRAVENOUS
  Filled 2024-08-16: qty 6

## 2024-08-16 MED ORDER — METOPROLOL TARTRATE 5 MG/5ML IV SOLN
5.0000 mg | Freq: Once | INTRAVENOUS | Status: AC
  Administered 2024-08-16: 5 mg via INTRAVENOUS
  Filled 2024-08-16: qty 5

## 2024-08-16 MED ORDER — PHENYLEPHRINE 80 MCG/ML (10ML) SYRINGE FOR IV PUSH (FOR BLOOD PRESSURE SUPPORT)
PREFILLED_SYRINGE | INTRAVENOUS | Status: DC | PRN
  Administered 2024-08-16: 40 ug via INTRAVENOUS

## 2024-08-16 MED ORDER — SUGAMMADEX SODIUM 200 MG/2ML IV SOLN
INTRAVENOUS | Status: DC | PRN
  Administered 2024-08-16: 400 mg via INTRAVENOUS

## 2024-08-16 MED ORDER — PROPOFOL 10 MG/ML IV BOLUS
INTRAVENOUS | Status: DC | PRN
  Administered 2024-08-16: 110 mg via INTRAVENOUS

## 2024-08-16 MED ORDER — LIDOCAINE 2% (20 MG/ML) 5 ML SYRINGE
INTRAMUSCULAR | Status: DC | PRN
  Administered 2024-08-16: 100 mg via INTRAVENOUS

## 2024-08-16 MED ORDER — ROCURONIUM BROMIDE 10 MG/ML (PF) SYRINGE
PREFILLED_SYRINGE | INTRAVENOUS | Status: DC | PRN
  Administered 2024-08-16 (×2): 30 mg via INTRAVENOUS

## 2024-08-16 MED ORDER — PROPOFOL 500 MG/50ML IV EMUL
INTRAVENOUS | Status: DC | PRN
  Administered 2024-08-16: 125 ug/kg/min via INTRAVENOUS

## 2024-08-16 MED ORDER — MIDAZOLAM HCL 2 MG/2ML IJ SOLN
INTRAMUSCULAR | Status: AC
  Filled 2024-08-16: qty 2

## 2024-08-16 MED ORDER — FENTANYL CITRATE (PF) 250 MCG/5ML IJ SOLN
INTRAMUSCULAR | Status: DC | PRN
  Administered 2024-08-16 (×2): 50 ug via INTRAVENOUS

## 2024-08-16 MED ORDER — ONDANSETRON HCL 4 MG/2ML IJ SOLN
INTRAMUSCULAR | Status: DC | PRN
  Administered 2024-08-16: 4 mg via INTRAVENOUS

## 2024-08-16 NOTE — Transfer of Care (Signed)
 Immediate Anesthesia Transfer of Care Note  Patient: [name]  Procedure(s) Performed: EGD (ESOPHAGOGASTRODUODENOSCOPY) CONTROL OF HEMORRHAGE, GI TRACT, ENDOSCOPIC, BY CLIPPING OR OVERSEWING  Patient Location: PACU  Anesthesia Type:General  Level of Consciousness: awake, alert , and oriented  Airway & Oxygen Therapy: Patient Spontanous Breathing and Patient connected to face mask  Post-op Assessment: Report given to RN, Post -op Vital signs reviewed and stable, and Patient moving all extremities X 4  Post vital signs: Reviewed and stable  Last Vitals:  Vitals Value Taken Time  BP 154/68 08/16/24 08:30  Temp    Pulse 84 08/16/24 08:32  Resp 21 08/16/24 08:32  SpO2 97 % 08/16/24 08:32  Vitals shown include unfiled device data.  Last Pain:  Vitals:   08/16/24 0828  TempSrc:   PainSc: 0-No pain      Patients Stated Pain Goal: 2 (08/15/24 0650)  Complications: No notable events documented.

## 2024-08-16 NOTE — Progress Notes (Signed)
 TCTS Evening Rounds:  Hemodynamically stable on Cardene  5 for HTN. Sats 93% 4L.   Had esophageal stent repositioned today by GI.  UO good.

## 2024-08-16 NOTE — Progress Notes (Signed)
 PHARMACY - TOTAL PARENTERAL NUTRITION CONSULT NOTE  Indication: Esophageal leak  Patient Measurements: Height: 5' 3 (160 cm) Weight: 72.2 kg (159 lb 2.8 oz) IBW/kg (Calculated) : 52.4 TPN AdjBW (KG): 57.4 Body mass index is 28.2 kg/m.  Assessment:  30 YOF with NSCLC here for RML lobectomy, resection of esophageal diverticulum, and lymph node dissection on 12/29.  Found to have a small contained leak post-op.   Patient reports an intentional weight loss of 40 lbs over a couple of years when she was diagnosed with DM.  She typically drinks one Orgain shake every morning for breakfast and eats one meal a day, which could be pizza, spaghetti, chicken, beef or fish along with vegetables and fruits.  Patient was eating normally until her planned surgery. RD concerned with patient being malnourished. Patient was on TPN 12/31 - 1/12 and was advanced to dysphagia 1 diet. However, she was NPO again 1/14-1/16. On 1/17 IR reported small amount of leak from distal esophageal stent. Patient developed AKI since TPN was held. Pharmacy reconsulted for TPN 1/17.  Nephrology following. No indication for RRT at this time. Pending serologies.  GI following. Continue NPO. Possibly reposition stent or insertion of new stent.  CT surgery primary. CXR showed pulmonary edema overnight. TPN held as of 16:21 1/18 due to concerns for volume. 2 doses of lasix  given.   Glucose / Insulin : hx DM, A1c 5.9% - BG 120-170s off of TPN.  Electrolytes: Na 142, K 3.5 (after 3 runs of K), CO2 25 (max acet in TPN), CoCa 9.2, Phos 2.2, Mg 1.7 Renal: SCr 2.55 (BL 0.8), BUN 46 Hepatic: LFTs / tbili WNL, albumin  2.8, TG 211 1/19 Intake / Output; MIVF: Fluids off; UOP 3600 ml charted after 2 doses of lasix , LBM 1/18, chest tube 0 mL.    GI Imaging:   12/30 esophagram: Positive for contrast extravasation and an esophageal leak in the mid esophagus 1/2 esophagram: Persistent contrast extravasation and esophageal leak, similar to  prior 1/6 esophagram: Limited contrast study due to patient aspiration and severe coughing spell. Study aborted. 1/12 esophagram: Patent esophageal stent. No evidence for extravasation or leak, Delayed esophageal emptying and gastroesophageal reflux leading to stasis of contrast in the esophagus. 1/15 CT: displaced esophageal stent in stomach, no bowel obstruction  1/17 esophagram: small volume esophageal leak at distal stent GI Surgeries / Procedures:  12/30 right middle lobectomy, resection esophageal diverticulum, lymph node biopsy  1/7 EGD- stent placement  1/9 EGD stent repositioned d/t migration to stomach  1/16 EGD- stent placed at esophageal ulcer, removed gastric stent, normal duoenum  1/19 EGD - stent replaced / repositioned   Central access: PICC 07/28/24 TPN start date: 07/28/24> 1/12; 1/17 >>   Nutritional Goals:  Goal TPN rate 75 ml/hr to provide 101g AA and 1721 kCal  RD Estimated Needs Total Energy Estimated Needs: 1700-1900 Total Protein Estimated Needs: 100-115g Total Fluid Estimated Needs: >1.7L/day  Current Nutrition:  TPN  1/08 CLD - passed swallow 1/09 CLD - patient feels can't swallow well > NPO 1/13 advanced to DYS1 1/14- NPO   Plan: 30 mmol Kphos and 2 g magnesium  sulfate IV supplementation today outside of TPN S/p diuresis, no longer on IV fluids. If continues to have issues with volume will consider concentrating TPN.   Resume TPN at goal 75 mL/hr at 1800, meeting 100% estimated needs  Electrolytes in TPN: Na 75 mEq/L, K 35 mEq/L, Ca 3 mEq/L, Mg 3 mEq/L, and Phos 5 mmol/L. Cl:Ac 1:1  Add  standard MVI and trace elements to TPN Continue Moderate q6h SSI and adjust as needed   Additional fluids per team  Monitor TPN labs on Mon/Thurs, and PRN  Thank you for allowing pharmacy to be a part of this patients care.  Rankin Sams, PharmD, BCCCP Clinical Pharmacist

## 2024-08-16 NOTE — Progress Notes (Addendum)
 "     [address]       [place] [address]             [phone]    S/p Clipping of esophageal stent 3 days s/p bronchoscopy   10 days s/p EGD, reposition esophageal stent 12 days s/p EGD, placement of esophageal stent 21 days s/p Xi RML, LN dissection, intercostal nerve block, resection of esophageal diverticulum  Subjective: Patient groggy as just returned from GI procedure. I spoke with patient and daughter about need to transfer to [facility] ICU to be placed on Cardene  drip to control BP (unable to run this drip on 2C)  Objective: Vital signs in last 24 hours: Temp:  [98.3 F (36.8 C)-99.5 F (37.5 C)] 99.4 F (37.4 C) (01/19 0710) Pulse Rate:  [85-106] 92 (01/19 0710) Cardiac Rhythm: Normal sinus rhythm (01/18 1903) Resp:  [17-22] 18 (01/19 0710) BP: (162-212)/(62-91) 189/76 (01/19 0720) SpO2:  [84 %-98 %] 93 % (01/19 0710) Weight:  [72.2 kg] 72.2 kg (01/19 0518)     Intake/Output from previous day: 01/18 0701 - 01/19 0700 In: 2365.5 [I.V.:1592.2; IV Piggyback:773.3] Out: 3600 [Urine:3600]   Physical Exam:  Cardiovascular: RRR Pulmonary: Clear to auscultation on left and diminished right base Abdomen: Soft, non tender, bowel sounds present. Extremities: No lower extremity edema. Wounds: Clean and dry.  No erythema or signs of infection. Chest Tube: to water seal, light yellowish drainage in Pleura Vac (pleura vac at 300 this am)  Lab Results: CBC: Recent Labs    08/15/24 0050 08/15/24 0539 08/15/24 1146 08/16/24 0500  WBC 10.2  --   --  12.2*  HGB 7.7*   < > 8.1* 9.1*  HCT 23.0*   < > 24.2* 27.5*  PLT 159  --   --  157   < > = values in this interval not displayed.   BMET:  Recent Labs    08/15/24 1800 08/16/24 0500  NA 144 142  K 3.3* 3.5  CL 109 107  CO2 22 25  GLUCOSE 135* 122*  BUN 50* 46*  CREATININE 2.39* 2.55*  CALCIUM 8.1* 8.2*    PT/INR:  Recent Labs    08/15/24 0050  LABPROT 16.9*  INR 1.3*   ABG:   INR: Will add last result for INR, ABG once components are confirmed Will add last 4 CBG results once components are confirmed  Assessment/Plan:  1. CV - SR. Hypertensive. On Clonidine  patch 0.3 mg, Hydralazine  10 mg IV QID, Labetalol  20 mg IV QID, and Nitroglycerin  patch 0.3 mg. Labetalol  IV unavailable so will start Nicardene drip (as discussed with  Dr. Kerrin). Will give IV Lopressor  until drip able to be started, along with other scheduled BP medications 2.  Pulmonary - On 2L of oxygen via [place]. Chest tube is to water seal, light yellowish drainage. Chest tube with scant output. CXR this am shows increased in patchy airspace disease suggesting focal PNA, tiny pleural effusions. Encourage incentive spirometer 4. CBGs 138/169/122. Will restart TPN after procedure this am  5. Anemia-H and H this am decreased to 9.3 and 27.1. 6. ID-on Zosyn  and Diflucan  for esophageal leak, aspiration PNA. WBC this am 12,200. Respiratory gram stain shows moderate gram negative rods. Respiratory culture shows few normal respiratory flora-no Staph aureus or Pseudomonas seen. Blood culture shows no growth 4 days. Bronchial alveolar lavage shows culture re incubated for better growth. 7. GI-had bloody stools Saturday and H and H decreased. No further bloody stools. ?  Source. GI following. She had existing esophageal stent re postiitend and 2 hemo clips placed to keep esophageal stent in place. 1 superficial gastric ulcer found in the cardia (in the process of healing) 8. History of tobacco abuse-continue Nicotine  patch 9. AKI, gross hematuria-creatinine this am slightly increased to 2.55. Appreciate nephrology's assistance 10. Transfer to 2H  Kyla HERO ZimmermanPA-C 08/16/2024,8:09 AM Patient seen and examined, agree with findings and plan outlined above Hospital out of labetalol - moved to [facility] for Cardene  drip Stent repositioned by Dr. Rollin Standing C. Kerrin, MD [facility] [phone]   "

## 2024-08-16 NOTE — Op Note (Signed)
 [facility] Patient Name: [name] Procedure Date : 08/16/2024 MRN: [id number] Attending MD: Belvie Just , MD, [id number] Date of Birth: 04/21/1956 CSN: [id number] Age: 69 Admit Type: Inpatient Procedure:                Upper GI endoscopy Indications:              Therapeutic procedure Providers:                Belvie Just, MD, Ozell Pouch, Corene Southgate,                            Technician Referring MD:              Medicines:                General Anesthesia Complications:            No immediate complications. Estimated Blood Loss:     Estimated blood loss: none. Procedure:                Pre-Anesthesia Assessment:                           - Prior to the procedure, a History and Physical                            was performed, and patient medications and                            allergies were reviewed. The patient's tolerance of                            previous anesthesia was also reviewed. The risks                            and benefits of the procedure and the sedation                            options and risks were discussed with the patient.                            All questions were answered, and informed consent                            was obtained. Prior Anticoagulants: The patient has                            taken no anticoagulant or antiplatelet agents. ASA                            Grade Assessment: III - A patient with severe                            systemic disease. After reviewing the risks and                            benefits,  the patient was deemed in satisfactory                            condition to undergo the procedure.                           - Sedation was administered by an anesthesia                            professional. General anesthesia was attained.                           After obtaining informed consent, the endoscope was                            passed under direct vision. Throughout the                             procedure, the patient's blood pressure, pulse, and                            oxygen saturations were monitored continuously. The                            [id number]-H190 ([id number]) Olympus endoscope was introduced                            through the mouth, and advanced to the second part                            of duodenum. The upper GI endoscopy was somewhat                            difficult. The patient tolerated the procedure well. Scope In: Scope Out: Findings:      An esophageal stent was found in the upper third of the esophagus and in       the middle third of the esophagus. Stent removal was accomplished with a       rat-toothed forceps. For location marking, two hemostatic clips were       successfully placed (MR safe). Clip manufacturer: [employer].       There was no bleeding at the end of the procedure.      One non-bleeding superficial gastric ulcer with a clean ulcer base       (Forrest Class III) was found in the cardia. The lesion was 10 mm in       largest dimension. This was secondary to the prior migrated stent. The       ulcer was in the process of healing.      The examined duodenum was normal.      The proximal portion of the stent was noted to be at 18 mm and it was       secured in position with the hemoclips. Advancement of the endoscope to       the distal end at 30 cm showed that the diverticulotomy was covered, but  the staples were exposed. This occurred as a result of the       foreshortening of the stent. The distal edge of the stent was just       covering the distal edge of the diverticulotomy. Using a Rat-toothed       forcep the distal end of the stent was captured and dragged to the GE       junction at 40 cm. The distal end bridged the GE junction. The endoscope       was retracted to the proximal end of the stent and the stent was pulled       back to around 23 cm. This repositioning allowed the stent to  cover the       diverticulotomy and the staples. Endoscopic confirmation was made that       the staples were covered. This allowed a 3-5 cm distance of the distal       end of the stent proximal to the GE junction. Two hemoclips were placed       at the proximal end of the stent to secure the prosthesis. Impression:               - Pre-existing esophageal stent, removed. Clip                            manufacturer: [employer]. Clips (MR safe)                            were placed.                           - Non-bleeding gastric ulcer with a clean ulcer                            base (Forrest Class III).                           - Normal examined duodenum. Recommendation:           - Return patient to hospital ward for ongoing care.                           - NPO.                           - Repeat esophagram. Procedure Code(s):        --- Professional ---                           [phone], Esophagogastroduodenoscopy, flexible,                            transoral; with removal of foreign body(s)                           43499, Unlisted procedure, esophagus Diagnosis Code(s):        --- Professional ---                           K25.9, Gastric ulcer, unspecified as acute or  chronic, without hemorrhage or perforation                           Z97.8, Presence of other specified devices CPT copyright 2022 American [employer] Association. All rights reserved. The codes documented in this report are preliminary and upon coder review may  be revised to meet current compliance requirements. Belvie Just, MD Belvie Just, MD 08/16/2024 8:40:54 AM This report has been signed electronically. Number of Addenda: 0

## 2024-08-16 NOTE — Progress Notes (Signed)
 Patient having squeezing chest pain, SOB tachycardia. O2 sat 84% on room air, 2L Butts placed, O2 sat up to 92%. On call PA paged see new orders. TPN turned off per MD.

## 2024-08-16 NOTE — Progress Notes (Signed)
 Patient ID: [name], female   DOB: [birth year], 69 y.o.   MRN: [id number] [facility] Progress Note   Assessment/ Plan:   1. Acute kidney Injury: Nonoliguric overnight with renal function essentially bouncing between 2.3-2.6 over the past 48 hours.  Suspected ATN from a combination of hemodynamic and nephrotoxic mechanisms.  She had gross hematuria that is suspected to be urological rather than glomerular/renal etiology (and has now cleared).  Serologies for GN are so far negative and ANCA as well as anti-GBM are pending. 2.  Esophageal leak following resection of esophageal diverticulum: Underwent placement of a new esophageal stent previous one migrated distally into the stomach.  Had EGD done earlier today with a nonbleeding superficial gastric ulcer seen in the cardia with repositioning of the distal edge of the esophageal stent. 3.  Hypertensive urgency: Started nicardipine  drip for blood pressure control while in ICU.  4. Anemia: related to ongoing losses including hematuria.   Subjective:   Reports to be fatigued after EGD done earlier today.  Informs me that her urine has completely cleared (no further hematuria).   Objective:   BP (!) 197/89 (BP Location: Left Arm)   Pulse 96   Temp 98.6 F (37 C) (Oral)   Resp 20   Ht 5' 3 (1.6 m)   Wt 72.2 kg   LMP 10/26/1998 Comment: ~IN 40s  SpO2 92%   BMI 28.20 kg/m   Intake/Output Summary (Last 24 hours) at 08/16/2024 1232 Last data filed at 08/16/2024 0946 Gross per 24 hour  Intake 1679.65 ml  Output 3800 ml  Net -2120.35 ml   Weight change: -2.6 kg  Physical Exam: Gen: Appears fatigued/groggy resting in bed CVS: Pulse regular rhythm, normal rate, S1 and S2 normal Resp: Diminished breath sounds with poor inspiratory effort over bases.  Chest tube in situ Abd: Soft, nontender, bowel sounds normal Ext: No lower extremity edema  Imaging: DG Chest Port 1 View Result Date: 08/16/2024 CLINICAL DATA:  Pleural  effusion. EXAM: PORTABLE CHEST 1 VIEW COMPARISON:  08/15/2024 FINDINGS: The cardiopericardial silhouette is within normal limits for size. Esophageal stent device overlies the mediastinum without substantial change in position. Right PICC line tip overlies the proximal SVC level. Asymmetric elevation right hemidiaphragm with bibasilar airspace disease. Increased airspace opacity is identified in the right parahilar lung with subtle patchy and nodular opacities over both upper lungs. Probable tiny effusions. Telemetry leads overlie the chest. IMPRESSION: 1. Interval increase in patchy and nodular bilateral airspace disease suggesting multifocal pneumonia. 2. Tiny effusions. Electronically Signed   By: Camellia Candle M.D.   On: 08/16/2024 06:42   DG Chest Port 1 View Result Date: 08/15/2024 CLINICAL DATA:  Pleural effusion. EXAM: PORTABLE CHEST 1 VIEW COMPARISON:  08/15/2024, 08/12/2024. FINDINGS: The heart size and mediastinal contours are stable. An esophageal stent is noted. There is atherosclerotic calcification of the aorta. The pulmonary vasculature is distended. Perihilar interstitial and airspace disease is noted bilaterally and increased from the prior exam. There small bilateral pleural effusions, greater on the right than on the left. No pneumothorax is seen. No acute osseous abnormality. A right PICC line is stable in position. IMPRESSION: 1. Pulmonary vascular congestion with perihilar interstitial and airspace opacities, increased from the prior exam, suggesting edema. 2. Small bilateral pleural effusions. Electronically Signed   By: Leita Birmingham M.D.   On: 08/15/2024 16:26   DG Chest Port 1 View Result Date: 08/15/2024 EXAM: 1 VIEW(S) XRAY OF THE CHEST 08/15/2024 06:00:00 AM COMPARISON: 08/14/2024 CLINICAL  HISTORY: Pleural effusion FINDINGS: LINES, TUBES AND DEVICES: Proximal to mid esophageal stent stable in place. Right PICC stable in place. LUNGS AND PLEURA: Right lung post-surgical changes.  Similar right perihilar opacities. Small right pleural effusion, unchanged. Mild pulmonary edema. No pneumothorax. HEART AND MEDIASTINUM: No acute abnormality of the cardiac and mediastinal silhouettes. BONES AND SOFT TISSUES: No acute osseous abnormality. IMPRESSION: 1. Small right pleural effusion, unchanged. 2. Mild pulmonary edema. Electronically signed by: Waddell Calk MD 08/15/2024 07:16 AM EST RP Workstation: [id number]    Labs: BMET Recent Labs  Lab 08/12/24 0546 08/12/24 1500 08/13/24 1000 08/14/24 0500 08/15/24 0050 08/15/24 1800 08/16/24 0500  NA 134* 135 137 140 142 144 142  K 4.0 3.5 3.9 4.0 3.6 3.3* 3.5  CL 98 105 105 107 111 109 107  CO2 25 22 21* 18* 21* 22 25  GLUCOSE 128* 96 93 142* 246* 135* 122*  BUN 23 24* 30* 39* 50* 50* 46*  CREATININE 1.43* 1.54* 2.01* 2.34* 2.55* 2.39* 2.55*  CALCIUM 8.5* 7.0* 8.1* 8.0* 7.6* 8.1* 8.2*  PHOS  --   --   --   --  3.3  --  2.2*   CBC Recent Labs  Lab 08/14/24 1325 08/14/24 1923 08/15/24 0050 08/15/24 0539 08/15/24 1146 08/16/24 0500  WBC 10.8* 11.4* 10.2  --   --  12.2*  NEUTROABS 8.4*  --   --   --   --   --   HGB 9.5* 8.3* 7.7* 7.9* 8.1* 9.1*  HCT 27.9* 25.0* 23.0* 22.8* 24.2* 27.5*  MCV 93.3 93.6 93.1  --   --  92.0  PLT 198 172 159  --   --  157    Medications:     Chlorhexidine  Gluconate Cloth  6 each Topical Daily   cloNIDine   0.3 mg Transdermal Weekly   fentaNYL   1 patch Transdermal Q72H   hydrALAZINE   10 mg Intravenous Q6H   insulin  aspart  0-15 Units Subcutaneous Q6H   ipratropium-albuterol   3 mL Nebulization Q6H   neomycin -bacitracin -polymyxin   Topical BID   nitroGLYCERIN   0.3 mg Transdermal Daily   pantoprazole  (PROTONIX ) IV  40 mg Intravenous Q12H   sodium chloride  flush  10-40 mL Intracatheter Q12H    Gordy Blanch, MD 08/16/2024, 12:32 PM

## 2024-08-16 NOTE — Anesthesia Procedure Notes (Signed)
 Procedure Name: Intubation Date/Time: 08/16/2024 7:43 AM  Performed by: Mollie Olivia SAUNDERS, CRNAPre-anesthesia Checklist: Patient identified, Emergency Drugs available, Suction available and Patient being monitored Patient Re-evaluated:Patient Re-evaluated prior to induction Oxygen Delivery Method: Circle system utilized Preoxygenation: Pre-oxygenation with 100% oxygen Induction Type: IV induction, Rapid sequence and Cricoid Pressure applied Laryngoscope Size: Glidescope and 3 Grade View: Grade I Tube type: Oral Tube size: 7.0 mm Number of attempts: 1 Airway Equipment and Method: Stylet and Oral airway Placement Confirmation: ETT inserted through vocal cords under direct vision, positive ETCO2 and breath sounds checked- equal and bilateral Secured at: 21 cm Tube secured with: Tape Dental Injury: Teeth and Oropharynx as per pre-operative assessment  Difficulty Due To: Difficulty was anticipated

## 2024-08-17 ENCOUNTER — Inpatient Hospital Stay (HOSPITAL_COMMUNITY)

## 2024-08-17 DIAGNOSIS — F17210 Nicotine dependence, cigarettes, uncomplicated: Secondary | ICD-10-CM | POA: Diagnosis not present

## 2024-08-17 DIAGNOSIS — R739 Hyperglycemia, unspecified: Secondary | ICD-10-CM | POA: Diagnosis not present

## 2024-08-17 DIAGNOSIS — K9189 Other postprocedural complications and disorders of digestive system: Secondary | ICD-10-CM

## 2024-08-17 DIAGNOSIS — N179 Acute kidney failure, unspecified: Secondary | ICD-10-CM

## 2024-08-17 DIAGNOSIS — I160 Hypertensive urgency: Secondary | ICD-10-CM

## 2024-08-17 DIAGNOSIS — Z902 Acquired absence of lung [part of]: Secondary | ICD-10-CM | POA: Diagnosis not present

## 2024-08-17 LAB — CULTURE, BLOOD (ROUTINE X 2)
Culture: NO GROWTH
Culture: NO GROWTH
Special Requests: ADEQUATE
Special Requests: ADEQUATE

## 2024-08-17 LAB — BASIC METABOLIC PANEL WITH GFR
Anion gap: 13 (ref 5–15)
BUN: 58 mg/dL — ABNORMAL HIGH (ref 8–23)
CO2: 23 mmol/L (ref 22–32)
Calcium: 8.1 mg/dL — ABNORMAL LOW (ref 8.9–10.3)
Chloride: 108 mmol/L (ref 98–111)
Creatinine, Ser: 2.72 mg/dL — ABNORMAL HIGH (ref 0.44–1.00)
GFR, Estimated: 19 mL/min — ABNORMAL LOW
Glucose, Bld: 226 mg/dL — ABNORMAL HIGH (ref 70–99)
Potassium: 3.4 mmol/L — ABNORMAL LOW (ref 3.5–5.1)
Sodium: 143 mmol/L (ref 135–145)

## 2024-08-17 LAB — CBC
HCT: 25.5 % — ABNORMAL LOW (ref 36.0–46.0)
Hemoglobin: 8.6 g/dL — ABNORMAL LOW (ref 12.0–15.0)
MCH: 32.2 pg (ref 26.0–34.0)
MCHC: 33.7 g/dL (ref 30.0–36.0)
MCV: 95.5 fL (ref 80.0–100.0)
Platelets: 139 K/uL — ABNORMAL LOW (ref 150–400)
RBC: 2.67 MIL/uL — ABNORMAL LOW (ref 3.87–5.11)
RDW: 15.7 % — ABNORMAL HIGH (ref 11.5–15.5)
WBC: 7 K/uL (ref 4.0–10.5)
nRBC: 0 % (ref 0.0–0.2)

## 2024-08-17 LAB — MAGNESIUM: Magnesium: 2 mg/dL (ref 1.7–2.4)

## 2024-08-17 LAB — GLUCOSE, CAPILLARY
Glucose-Capillary: 176 mg/dL — ABNORMAL HIGH (ref 70–99)
Glucose-Capillary: 232 mg/dL — ABNORMAL HIGH (ref 70–99)
Glucose-Capillary: 260 mg/dL — ABNORMAL HIGH (ref 70–99)
Glucose-Capillary: 266 mg/dL — ABNORMAL HIGH (ref 70–99)
Glucose-Capillary: 291 mg/dL — ABNORMAL HIGH (ref 70–99)

## 2024-08-17 LAB — PHOSPHORUS: Phosphorus: 2.1 mg/dL — ABNORMAL LOW (ref 2.5–4.6)

## 2024-08-17 MED ORDER — INSULIN GLARGINE 100 UNIT/ML ~~LOC~~ SOLN
15.0000 [IU] | Freq: Two times a day (BID) | SUBCUTANEOUS | Status: DC
  Administered 2024-08-17 – 2024-08-20 (×7): 15 [IU] via SUBCUTANEOUS
  Filled 2024-08-17 (×8): qty 0.15

## 2024-08-17 MED ORDER — FENTANYL 50 MCG/HR TD PT72
1.0000 | MEDICATED_PATCH | TRANSDERMAL | Status: DC
  Administered 2024-08-17: 1 via TRANSDERMAL
  Filled 2024-08-17: qty 1

## 2024-08-17 MED ORDER — PHENYLEPHRINE HCL-NACL 20-0.9 MG/250ML-% IV SOLN
INTRAVENOUS | Status: AC
  Filled 2024-08-17: qty 500

## 2024-08-17 MED ORDER — POTASSIUM PHOSPHATES 15 MMOLE/5ML IV SOLN
30.0000 mmol | Freq: Once | INTRAVENOUS | Status: AC
  Administered 2024-08-17: 30 mmol via INTRAVENOUS
  Filled 2024-08-17: qty 10

## 2024-08-17 MED ORDER — POTASSIUM CHLORIDE 10 MEQ/50ML IV SOLN
10.0000 meq | INTRAVENOUS | Status: AC
  Administered 2024-08-17 (×3): 10 meq via INTRAVENOUS
  Filled 2024-08-17 (×3): qty 50

## 2024-08-17 MED ORDER — TRACE MINERALS CU-MN-SE-ZN 300-55-60-3000 MCG/ML IV SOLN
INTRAVENOUS | Status: AC
  Filled 2024-08-17: qty 672

## 2024-08-17 MED ORDER — IOHEXOL 300 MG/ML  SOLN
100.0000 mL | Freq: Once | INTRAMUSCULAR | Status: AC | PRN
  Administered 2024-08-17: 100 mL via ORAL

## 2024-08-17 MED ORDER — FUROSEMIDE 10 MG/ML IJ SOLN
60.0000 mg | Freq: Once | INTRAMUSCULAR | Status: AC
  Administered 2024-08-17: 60 mg via INTRAVENOUS
  Filled 2024-08-17: qty 6

## 2024-08-17 NOTE — Progress Notes (Signed)
" ° °  [address] [place] [address]             [phone]   Has been taking clears Still requesting dilaudid  on schedule  BP (!) 155/62   Pulse (!) 105   Temp 98.1 F (36.7 C) (Axillary)   Resp 14   Ht 5' 3 (1.6 m)   Wt 71.9 kg   LMP 10/26/1998 Comment: ~IN 40s  SpO2 93%   BMI 28.08 kg/m   No leak noted on esophagram  Hopefully can advance diet soon and start PO pain meds  Elspeth C. Kerrin, MD [facility] [phone]  "

## 2024-08-17 NOTE — Progress Notes (Signed)
 "  NAME:  [name], MRN:  [id number], DOB:  10/21/1955, LOS: 22 ADMISSION DATE:  07/26/2024, CONSULTATION DATE:  1/15 REFERRING MD:  Kerrin, CHIEF COMPLAINT:  sepsis   History of Present Illness:  [name] is a 69 y/o woman with a history of tobacco abuse and lung adenocarcinoma status post RML lobectomy on 12/29.  At the time of surgery was a large cyst that was resected. Despite oversewing it time resection she later developed an esophageal leak.  She required an esophageal stent which she has had recurrent problems with related to positioning/migration of the stent (necessitating multiple EGDs for stent revision). From 1/15 through 1/18, the patient was consistently total body balance positive in setting of receiving NS @ 100 mL/hr and then starting TPN as of 1/18. Due to concomitant AKI, developed volume overload and pulmonary edema and hypertensive urgency ultimately requiring a Cardene  drip be started overnight 1/19. Also on 1/19, she underwent revision of the position of her esophageal stent to address a small esophageal leak.  Pertinent  Medical History  Lung adenocarcinoma Latent  TB, treated in 1980 HTN HLD DM OSA Tobacco abuse  Significant Hospital Events: Including procedures, antibiotic start and stop dates in addition to other pertinent events   12/29 RML lobectomy 1/7 EGD with esophageal stent 1/9 EGD  with stent reposition after migration to stomach 1/15 vanc added, zosyn  restarted. CT  1/19: EGD for stent repositioning due to ongoing small esophageal leak / stent migration  Interim History / Subjective:  Due to the stent appearing to be in a relatively unchanged position at least by CXR, a gastrograffin study was performed => this showed no leak.  Objective    Blood pressure (!) 154/57, pulse (!) 108, temperature 97.9 F (36.6 C), temperature source Oral, resp. rate 18, height 5' 3 (1.6 m), weight 71.9 kg, last menstrual period 10/26/1998, SpO2 94%.         Intake/Output Summary (Last 24 hours) at 08/17/2024 1534 Last data filed at 08/17/2024 1500 Gross per 24 hour  Intake 3897.99 ml  Output 2650 ml  Net 1247.99 ml   Filed Weights   08/15/24 0407 08/16/24 0518 08/17/24 0500  Weight: 74.8 kg 72.2 kg 71.9 kg    Examination: General: chronically ill appearing woman lying in bed in NAD, A&Ox3, on 5L [place] HENT: [place]/AT, eyes anicteric Lungs: Breathing comfortably, no rhonchi. Cardiovascular: S1S2, RRR  Abdomen: soft, NT Extremities: no peripheral edema Neuro: awake, alert, moving all extremities   I/Os: Ins - 3,152 Out - 3,050 Net - +100 mL for 24 hrs  BUN 58 K 3.4 Cr 2.55 -> 2.72 (slightly increased)  Resolved problem list   Assessment and Plan   Esophageal leak - S/p esophageal stenting c/b migration, now s/p repositioning by GI most recently on 1/19 - R chest tube per TCTS - TPN until cleared by GI/surgery to take PO, though suspect may be able to based on gastrograffin study today  AKI Hypertensive urgency - Probable component of ATN at this point - Nephrology following and also suspects ATN - She is grossly hypervolemic which is exacerbating hypertension for which she is now on nicardipine  drip; Lasix  60 mg IV x2 today - Nicardipine  drip, hydralazine , clonidine  patch  Lung adenocarcinoma, s/p lobectomy -post op care per TCTS -pain control as ordered- fentanyl  patch, dilaudid   H/o tobacco abuse -recommend cessation -con't nicotine  patch  Hyperglycemia -SSI + glargine -goal BG 140-180   Labs   CBC: Recent Labs  Lab 08/14/24 1325  08/14/24 1923 08/15/24 0050 08/15/24 0539 08/15/24 1146 08/16/24 0500 08/17/24 0515  WBC 10.8* 11.4* 10.2  --   --  12.2* 7.0  NEUTROABS 8.4*  --   --   --   --   --   --   HGB 9.5* 8.3* 7.7* 7.9* 8.1* 9.1* 8.6*  HCT 27.9* 25.0* 23.0* 22.8* 24.2* 27.5* 25.5*  MCV 93.3 93.6 93.1  --   --  92.0 95.5  PLT 198 172 159  --   --  157 139*    Basic Metabolic Panel: Recent Labs   Lab 08/14/24 0500 08/15/24 0050 08/15/24 1800 08/16/24 0500 08/17/24 0958  NA 140 142 144 142 143  K 4.0 3.6 3.3* 3.5 3.4*  CL 107 111 109 107 108  CO2 18* 21* 22 25 23   GLUCOSE 142* 246* 135* 122* 226*  BUN 39* 50* 50* 46* 58*  CREATININE 2.34* 2.55* 2.39* 2.55* 2.72*  CALCIUM 8.0* 7.6* 8.1* 8.2* 8.1*  MG  --  2.0  --  1.7 2.0  PHOS  --  3.3  --  2.2* 2.1*   GFR: Estimated Creatinine Clearance: 19.3 mL/min (A) (by C-G formula based on SCr of 2.72 mg/dL (H)). Recent Labs  Lab 08/14/24 1923 08/15/24 0050 08/16/24 0500 08/17/24 0515  WBC 11.4* 10.2 12.2* 7.0     Critical care time:    Total critical care time spent by me: 35 minutes   Critical care time was exclusive of separately billable procedures and the treatment of any other patient.   Critical care was necessary to treat or prevent imminent or life-threatening deterioration.  Critical care was time spent personally by me on the following activities: development of treatment plan with patient and/or surrogate as well as nursing, discussions with consultants, re-evaluation of the patient's condition and their response to treatment, examination of patient, obtaining history from patient or surrogate, ordering and performing treatments and interventions, ordering and review of laboratory studies, ordering and review of radiographic studies, and participation in multidisciplinary rounds.  Kelly Dales, MD Pulmonary, Critical Care & Sleep Medicine [facility]  7a-7p: For contact information, see AMION. If no response to pager, please call PCCM 2-H APP. After 7p: Please call PCCM APP on-call for 2-H.   "

## 2024-08-17 NOTE — Progress Notes (Signed)
 Patient ID: [name], female   DOB: 05/21/1956, 69 y.o.   MRN: [id number] [facility] Progress Note   Assessment/ Plan:   1. Acute kidney Injury: Nonoliguric overnight with labs pending after previous creatinine noted to be between 2.3-2.6 over the past 48 hours (possible plateau phase of ATN).  Suspected ATN from a combination of hemodynamic and nephrotoxic mechanisms.  Earlier she had gross hematuria raising concern for vasculitis/crossover GN however serologies are all negative including negative ANCA and anti-GBM.  Clinically appears euvolemic and without any uremic symptoms.  Additional decision regarding management will be based on labs from today. 2.  Esophageal leak following resection of esophageal diverticulum: Underwent placement of a new esophageal stent previous one migrated distally into the stomach.  Had EGD done earlier today with a nonbleeding superficial gastric ulcer seen in the cardia with repositioning of the distal edge of the esophageal stent. 3.  Hypertensive urgency: Improved blood pressures noted on nicardipine  drip.  Will transition to oral antihypertensive therapy when cleared for swallowing. 4. Anemia: related to ongoing losses including hematuria.   Subjective:   Somnolent for most of yesterday after endoscopy.  Feels better this morning.   Objective:   BP (!) 152/64   Pulse 99   Temp 98.2 F (36.8 C) (Oral)   Resp 17   Ht 5' 3 (1.6 m)   Wt 71.9 kg   LMP 10/26/1998 Comment: ~IN 40s  SpO2 93%   BMI 28.08 kg/m   Intake/Output Summary (Last 24 hours) at 08/17/2024 0751 Last data filed at 08/17/2024 0700 Gross per 24 hour  Intake 3027.55 ml  Output 2850 ml  Net 177.55 ml   Weight change: -0.3 kg  Physical Exam: Gen: Comfortably resting in bed, family at bedside CVS: Pulse regular rhythm, normal rate, S1 and S2 normal Resp: Diminished breath sounds with poor inspiratory effort over bases.  Chest tube in situ Abd: Soft, nontender, bowel  sounds normal Ext: No lower extremity edema  Imaging: DG CHEST PORT 1 VIEW Result Date: 08/17/2024 CLINICAL DATA:  Right pleural effusion. EXAM: PORTABLE CHEST 1 VIEW COMPARISON:  08/16/2024 FINDINGS: Stable cardiopericardial silhouette. Interval progression of bibasilar airspace disease, likely atelectatic as is second film performed as part of this exam shows improved aeration both lung bases. Probable tiny right pleural effusion. Patchy right greater than left airspace disease in the mid and upper lungs is similar. Right PICC line tip overlies the proximal to mid SVC level. Right chest tube remains in place. IMPRESSION: 1. Similar bibasilar airspace disease, a component of which is atelectatic. 2. Probable tiny right pleural effusion. 3. Stable patchy right greater than left airspace disease in the mid and upper lungs. Electronically Signed   By: Camellia Candle M.D.   On: 08/17/2024 06:10   DG Chest Port 1 View Result Date: 08/16/2024 CLINICAL DATA:  Pleural effusion. EXAM: PORTABLE CHEST 1 VIEW COMPARISON:  08/15/2024 FINDINGS: The cardiopericardial silhouette is within normal limits for size. Esophageal stent device overlies the mediastinum without substantial change in position. Right PICC line tip overlies the proximal SVC level. Asymmetric elevation right hemidiaphragm with bibasilar airspace disease. Increased airspace opacity is identified in the right parahilar lung with subtle patchy and nodular opacities over both upper lungs. Probable tiny effusions. Telemetry leads overlie the chest. IMPRESSION: 1. Interval increase in patchy and nodular bilateral airspace disease suggesting multifocal pneumonia. 2. Tiny effusions. Electronically Signed   By: Camellia Candle M.D.   On: 08/16/2024 06:42   DG Chest [facility]  1 View Result Date: 08/15/2024 CLINICAL DATA:  Pleural effusion. EXAM: PORTABLE CHEST 1 VIEW COMPARISON:  08/15/2024, 08/12/2024. FINDINGS: The heart size and mediastinal contours are stable. An  esophageal stent is noted. There is atherosclerotic calcification of the aorta. The pulmonary vasculature is distended. Perihilar interstitial and airspace disease is noted bilaterally and increased from the prior exam. There small bilateral pleural effusions, greater on the right than on the left. No pneumothorax is seen. No acute osseous abnormality. A right PICC line is stable in position. IMPRESSION: 1. Pulmonary vascular congestion with perihilar interstitial and airspace opacities, increased from the prior exam, suggesting edema. 2. Small bilateral pleural effusions. Electronically Signed   By: Leita Birmingham M.D.   On: 08/15/2024 16:26    Labs: BMET Recent Labs  Lab 08/12/24 0546 08/12/24 1500 08/13/24 1000 08/14/24 0500 08/15/24 0050 08/15/24 1800 08/16/24 0500  NA 134* 135 137 140 142 144 142  K 4.0 3.5 3.9 4.0 3.6 3.3* 3.5  CL 98 105 105 107 111 109 107  CO2 25 22 21* 18* 21* 22 25  GLUCOSE 128* 96 93 142* 246* 135* 122*  BUN 23 24* 30* 39* 50* 50* 46*  CREATININE 1.43* 1.54* 2.01* 2.34* 2.55* 2.39* 2.55*  CALCIUM 8.5* 7.0* 8.1* 8.0* 7.6* 8.1* 8.2*  PHOS  --   --   --   --  3.3  --  2.2*   CBC Recent Labs  Lab 08/14/24 1325 08/14/24 1923 08/15/24 0050 08/15/24 0539 08/15/24 1146 08/16/24 0500 08/17/24 0515  WBC 10.8* 11.4* 10.2  --   --  12.2* 7.0  NEUTROABS 8.4*  --   --   --   --   --   --   HGB 9.5* 8.3* 7.7* 7.9* 8.1* 9.1* 8.6*  HCT 27.9* 25.0* 23.0* 22.8* 24.2* 27.5* 25.5*  MCV 93.3 93.6 93.1  --   --  92.0 95.5  PLT 198 172 159  --   --  157 139*    Medications:     Chlorhexidine  Gluconate Cloth  6 each Topical Daily   cloNIDine   0.3 mg Transdermal Weekly   fentaNYL   1 patch Transdermal Q72H   hydrALAZINE   10 mg Intravenous Q6H   insulin  aspart  0-15 Units Subcutaneous Q6H   ipratropium-albuterol   3 mL Nebulization Q6H   neomycin -bacitracin -polymyxin   Topical BID   nitroGLYCERIN   0.3 mg Transdermal Daily   pantoprazole  (PROTONIX ) IV  40 mg  Intravenous Q12H   sodium chloride  flush  10-40 mL Intracatheter Q12H    Gordy Blanch, MD 08/17/2024, 7:51 AM

## 2024-08-17 NOTE — Anesthesia Postprocedure Evaluation (Addendum)
"   Anesthesia Post Note  Patient: [facility]  Procedure(s) Performed: EGD (ESOPHAGOGASTRODUODENOSCOPY) CONTROL OF HEMORRHAGE, GI TRACT, ENDOSCOPIC, BY CLIPPING OR OVERSEWING     Patient location during evaluation: Endoscopy Anesthesia Type: General Level of consciousness: awake and alert Pain management: pain level controlled Vital Signs Assessment: post-procedure vital signs reviewed and stable Respiratory status: spontaneous breathing, nonlabored ventilation and respiratory function stable Cardiovascular status: blood pressure returned to baseline and stable Postop Assessment: no apparent nausea or vomiting Anesthetic complications: no   No notable events documented.  Last Vitals:  Vitals:   08/17/24 0813 08/17/24 0814  BP:  (!) 146/63  Pulse:  (!) 102  Resp:  20  Temp: 37.2 C   SpO2:  92%    Last Pain:  Vitals:   08/17/24 0813  TempSrc: Axillary  PainSc:                  Garnette FORBES Skillern      "

## 2024-08-17 NOTE — TOC Progression Note (Addendum)
 Transition of Care [facility]) - Progression Note    Patient Details  Name: [name] MRN: [id number] Date of Birth: 17-Jan-1956  Transition of [facility]) CM/SW Contact  Graves-Bigelow, Erminio Deems, RN Phone Number: 08/17/2024, 3:48 PM  Clinical Narrative: Patient post clipping of esophageal stent, bronchoscopy, and EGD. Patient was a transfer from [facility] to [facility] on 08-16-24. Patient continues on Cardene  gtt and IV Zosyn . ICM will continue to follow for disposition needs as the patient progresses.    Expected Discharge Plan: Home/Self Care vs Home Health Barriers to Discharge: Continued Medical Work up  Expected Discharge Plan and Services   Living arrangements for the past 2 months: Single Family Home   Social Drivers of Health (SDOH) Interventions SDOH Screenings   Food Insecurity: No Food Insecurity (07/26/2024)  Housing: Low Risk (07/26/2024)  Transportation Needs: No Transportation Needs (07/26/2024)  Utilities: Not At Risk (07/26/2024)  Depression (PHQ2-9): Low Risk (06/16/2024)  Social Connections: Unknown (07/26/2024)  Tobacco Use: Medium Risk (08/16/2024)    Readmission Risk Interventions     No data to display

## 2024-08-17 NOTE — Progress Notes (Signed)
 1 Day Post-Op Procedures (LRB): EGD (ESOPHAGOGASTRODUODENOSCOPY) (N/A) CONTROL OF HEMORRHAGE, GI TRACT, ENDOSCOPIC, BY CLIPPING OR OVERSEWING Subjective: Didn't sleep last night, still with pain at CT site  Objective: Vital signs in last 24 hours: Temp:  [98.2 F (36.8 C)-99.6 F (37.6 C)] 98.2 F (36.8 C) (01/20 0311) Pulse Rate:  [83-106] 99 (01/20 0700) Cardiac Rhythm: Normal sinus rhythm (01/20 0400) Resp:  [10-28] 17 (01/20 0700) BP: (122-215)/(53-96) 152/64 (01/20 0700) SpO2:  [88 %-100 %] 93 % (01/20 0700) Weight:  [71.9 kg] 71.9 kg (01/20 0500)  Hemodynamic parameters for last 24 hours:    Intake/Output from previous day: 01/19 0701 - 01/20 0700 In: 3152.8 [I.V.:2020.4; IV Piggyback:1132.5] Out: 3050 [Urine:3050] Intake/Output this shift: No intake/output data recorded.  General appearance: alert, cooperative, and no distress Neurologic: intact Heart: regular rate and rhythm Lungs: diminished breath sounds right base Wound: intact, erythema at CT site  Lab Results: Recent Labs    08/16/24 0500 08/17/24 0515  WBC 12.2* 7.0  HGB 9.1* 8.6*  HCT 27.5* 25.5*  PLT 157 139*   BMET:  Recent Labs    08/15/24 1800 08/16/24 0500  NA 144 142  K 3.3* 3.5  CL 109 107  CO2 22 25  GLUCOSE 135* 122*  BUN 50* 46*  CREATININE 2.39* 2.55*  CALCIUM 8.1* 8.2*    PT/INR:  Recent Labs    08/15/24 0050  LABPROT 16.9*  INR 1.3*   ABG    Component Value Date/Time   TCO2 25 11/10/2009 0952   CBG (last 3)  Recent Labs    08/16/24 1723 08/17/24 0003 08/17/24 0614  GLUCAP 154* 291* 260*    Assessment/Plan: S/P Procedures (LRB): EGD (ESOPHAGOGASTRODUODENOSCOPY) (N/A) CONTROL OF HEMORRHAGE, GI TRACT, ENDOSCOPIC, BY CLIPPING OR OVERSEWING - NEURO- intact CV- in SR Hypertension- on catapres  and NTG patches, Cardene  drip  BP better, PO meds once able to take PO RESP_ on 5L [place], CXR with patchy airspace disease  CT remains in place for esophageal  leak RENAL- BMEt pending  I/O about even ENDO- CBG elevated on TNA  Add Lantus  while on TNA GI- esophageal leak post resection of esophageal diverticulum  Stent repositioned yesterday  Still appears relatively high on CXR, but was covering defect on endo  Plan per Dr. Rollin  No further bleeding per rectum ID- on Zosyn  and Diflucan   WBC down to 7K DVT prophylaxis- SCD and mobilize  Enoxaparin  stopped due to rectal bleed and hematuria  LOS: 22 days    [name] 08/17/2024

## 2024-08-17 NOTE — Progress Notes (Signed)
 PHARMACY - TOTAL PARENTERAL NUTRITION CONSULT NOTE  Indication: Esophageal leak  Patient Measurements: Height: 5' 3 (160 cm) Weight: 71.9 kg (158 lb 8.2 oz) IBW/kg (Calculated) : 52.4 TPN AdjBW (KG): 57.4 Body mass index is 28.08 kg/m.  Assessment:  74 YOF with NSCLC here for RML lobectomy, resection of esophageal diverticulum, and lymph node dissection on 12/29.  Found to have a small contained leak post-op.   Patient reports an intentional weight loss of 40 lbs over a couple of years when she was diagnosed with DM.  She typically drinks one Orgain shake every morning for breakfast and eats one meal a day, which could be pizza, spaghetti, chicken, beef or fish along with vegetables and fruits.  Patient was eating normally until her planned surgery. RD concerned with patient being malnourished. Patient was on TPN 12/31 - 1/12 and was advanced to dysphagia 1 diet. However, she was NPO again 1/14-1/16. On 1/17 IR reported small amount of leak from distal esophageal stent. Patient developed AKI since TPN was held. Pharmacy reconsulted for TPN 1/17.  Nephrology following. Serologies negative.  F/u repeat esophogram.   Glucose / Insulin : hx DM, A1c 5.9% - BG 142-291, received decadron  10 mg x1 on 1/19. CT surgery adding 15 units long acting insulin  BID.  Electrolytes: Na 143, K 3.4, CO2 23 (1:1 TPN), CoCa 9.1, Phos 2.1, Mg 2.0 Renal: SCr 2.72 (BL 0.8), BUN 58 Hepatic: LFTs / tbili WNL, albumin  2.8, TG 211 1/19 Intake / Output; MIVF: Fluids off; UOP 3.1 L charted after lasix  60 mg yesterday, LBM 1/18, chest tube 0 mL.   Net IO Since Admission: 17,990.54 mL [08/17/24 1051]  GI Imaging:   12/30 esophagram: Positive for contrast extravasation and an esophageal leak in the mid esophagus 1/2 esophagram: Persistent contrast extravasation and esophageal leak, similar to prior 1/6 esophagram: Limited contrast study due to patient aspiration and severe coughing spell. Study aborted. 1/12  esophagram: Patent esophageal stent. No evidence for extravasation or leak, Delayed esophageal emptying and gastroesophageal reflux leading to stasis of contrast in the esophagus. 1/15 CT: displaced esophageal stent in stomach, no bowel obstruction  1/17 esophagram: small volume esophageal leak at distal stent GI Surgeries / Procedures:  12/30 right middle lobectomy, resection esophageal diverticulum, lymph node biopsy  1/7 EGD- stent placement  1/9 EGD stent repositioned d/t migration to stomach  1/16 EGD- stent placed at esophageal ulcer, removed gastric stent, normal duoenum  1/19 EGD - stent replaced / repositioned   Central access: PICC 07/28/24 TPN start date: 07/28/24> 1/12; 1/17 >>   Nutritional Goals:  Goal unconcentrated TPN rate 75 ml/hr to provide 101g AA and 1721 kCal Goal concentrated TPN rate 60 mL/hr to provide 101 g AA and 1743 kCal  RD Estimated Needs Total Energy Estimated Needs: 1700-1900 Total Protein Estimated Needs: 100-115g Total Fluid Estimated Needs: >1.7L/day  Current Nutrition:  TPN  1/08 CLD - passed swallow 1/09 CLD - patient feels can't swallow well > NPO 1/13 advanced to DYS1 1/14- NPO   Plan:  30 mmol Kphos outside of TPN today (received Lasix  again yesterday)  S/p diuresis, no longer on IV fluids. Concentrate TPN at goal 60 mL/hr at 1800, meeting 100% estimated needs  Electrolytes in TPN: Na 75 mEq/L, K 35 mEq/L, Ca 3 mEq/L, Mg 3 mEq/L, and Phos 10 mmol/L. Cl:Ac 1:1  Add standard MVI and trace elements to TPN Continue Moderate q6h SSI and adjust as needed, received decadron  yesterday. Noted CT surgery addition of Lantus  15 BID.  Additional fluids per team  Monitor TPN labs on Mon/Thurs, and PRN  Thank you for allowing pharmacy to be a part of this patients care.  Rankin Sams, PharmD, BCCCP Clinical Pharmacist

## 2024-08-17 NOTE — Progress Notes (Addendum)
 Subjective: No new complaints.  Objective: Vital signs in last 24 hours: Temp:  [98.2 F (36.8 C)-99.6 F (37.6 C)] 99 F (37.2 C) (01/20 0813) Pulse Rate:  [88-106] 102 (01/20 0814) Resp:  [10-28] 20 (01/20 0814) BP: (122-215)/(53-96) 146/63 (01/20 0814) SpO2:  [88 %-97 %] 92 % (01/20 0814) Weight:  [71.9 kg] 71.9 kg (01/20 0500) Last BM Date : 08/15/24  Intake/Output from previous day: 01/19 0701 - 01/20 0700 In: 3152.8 [I.V.:2020.4; IV Piggyback:1132.5] Out: 3050 [Urine:3050] Intake/Output this shift: Total I/O In: 136 [I.V.:123.5; IV Piggyback:12.5] Out: 0   General appearance: alert and mild distress GI: soft, non-tender; bowel sounds normal; no masses,  no organomegaly  Lab Results: Recent Labs    08/15/24 0050 08/15/24 0539 08/15/24 1146 08/16/24 0500 08/17/24 0515  WBC 10.2  --   --  12.2* 7.0  HGB 7.7*   < > 8.1* 9.1* 8.6*  HCT 23.0*   < > 24.2* 27.5* 25.5*  PLT 159  --   --  157 139*   < > = values in this interval not displayed.   BMET Recent Labs    08/15/24 0050 08/15/24 1800 08/16/24 0500  NA 142 144 142  K 3.6 3.3* 3.5  CL 111 109 107  CO2 21* 22 25  GLUCOSE 246* 135* 122*  BUN 50* 50* 46*  CREATININE 2.55* 2.39* 2.55*  CALCIUM 7.6* 8.1* 8.2*   LFT Recent Labs    08/16/24 0500  PROT 5.4*  ALBUMIN  2.8*  AST 25  ALT 16  ALKPHOS 51  BILITOT 0.4   PT/INR Recent Labs    08/14/24 2248 08/15/24 0050  LABPROT 16.7* 16.9*  INR 1.3* 1.3*   Hepatitis Panel No results for input(s): HEPBSAG, HCVAB, HEPAIGM, HEPBIGM in the last 72 hours. C-Diff No results for input(s): CDIFFTOX in the last 72 hours. Fecal Lactopherrin No results for input(s): FECLLACTOFRN in the last 72 hours.  Studies/Results: DG CHEST PORT 1 VIEW Result Date: 08/17/2024 CLINICAL DATA:  Right pleural effusion. EXAM: PORTABLE CHEST 1 VIEW COMPARISON:  08/16/2024 FINDINGS: Stable cardiopericardial silhouette. Interval progression of bibasilar airspace  disease, likely atelectatic as is second film performed as part of this exam shows improved aeration both lung bases. Probable tiny right pleural effusion. Patchy right greater than left airspace disease in the mid and upper lungs is similar. Right PICC line tip overlies the proximal to mid SVC level. Right chest tube remains in place. IMPRESSION: 1. Similar bibasilar airspace disease, a component of which is atelectatic. 2. Probable tiny right pleural effusion. 3. Stable patchy right greater than left airspace disease in the mid and upper lungs. Electronically Signed   By: Camellia Candle M.D.   On: 08/17/2024 06:10   DG Chest Port 1 View Result Date: 08/16/2024 CLINICAL DATA:  Pleural effusion. EXAM: PORTABLE CHEST 1 VIEW COMPARISON:  08/15/2024 FINDINGS: The cardiopericardial silhouette is within normal limits for size. Esophageal stent device overlies the mediastinum without substantial change in position. Right PICC line tip overlies the proximal SVC level. Asymmetric elevation right hemidiaphragm with bibasilar airspace disease. Increased airspace opacity is identified in the right parahilar lung with subtle patchy and nodular opacities over both upper lungs. Probable tiny effusions. Telemetry leads overlie the chest. IMPRESSION: 1. Interval increase in patchy and nodular bilateral airspace disease suggesting multifocal pneumonia. 2. Tiny effusions. Electronically Signed   By: Camellia Candle M.D.   On: 08/16/2024 06:42   DG Chest Port 1 View Result Date: 08/15/2024 CLINICAL DATA:  Pleural  effusion. EXAM: PORTABLE CHEST 1 VIEW COMPARISON:  08/15/2024, 08/12/2024. FINDINGS: The heart size and mediastinal contours are stable. An esophageal stent is noted. There is atherosclerotic calcification of the aorta. The pulmonary vasculature is distended. Perihilar interstitial and airspace disease is noted bilaterally and increased from the prior exam. There small bilateral pleural effusions, greater on the right than  on the left. No pneumothorax is seen. No acute osseous abnormality. A right PICC line is stable in position. IMPRESSION: 1. Pulmonary vascular congestion with perihilar interstitial and airspace opacities, increased from the prior exam, suggesting edema. 2. Small bilateral pleural effusions. Electronically Signed   By: Leita Birmingham M.D.   On: 08/15/2024 16:26    Medications: Scheduled:  Chlorhexidine  Gluconate Cloth  6 each Topical Daily   cloNIDine   0.3 mg Transdermal Weekly   fentaNYL   1 patch Transdermal Q72H   hydrALAZINE   10 mg Intravenous Q6H   insulin  aspart  0-15 Units Subcutaneous Q6H   insulin  glargine  15 Units Subcutaneous BID   ipratropium-albuterol   3 mL Nebulization Q6H   neomycin -bacitracin -polymyxin   Topical BID   nitroGLYCERIN   0.3 mg Transdermal Daily   pantoprazole  (PROTONIX ) IV  40 mg Intravenous Q12H   sodium chloride  flush  10-40 mL Intracatheter Q12H   Continuous:  fluconazole  (DIFLUCAN ) IV Stopped (08/16/24 1146)   niCARDipine  5 mg/hr (08/17/24 0800)   piperacillin -tazobactam (ZOSYN )  IV 12.5 mL/hr at 08/17/24 0800   promethazine  (PHENERGAN ) injection (IM or IVPB) Stopped (08/14/24 2051)   TPN ADULT (ION) 75 mL/hr at 08/17/24 0800    Assessment/Plan: 1) Esophageal leak s/p repositioning of the esophageal stent. 2) Anemia - stable.  No reports of any further hematochezia. 3) Lung cancer s/p resection.   The images of the CXR were reviewed.  It appears as if there was no change with the repositioning of the stent, even though it was endoscopically confirmed.  A repeat esophageal will help to see if the stent is covering the mucosal defect.  As for her recent hematochezia, her HGB was stable and she does not report any further bleeding.  Plan: 1) Repeat esophagram. 2) If a leak persists, then a new stent will be placed.  It may be that the stent will need to be placed across the GE junction. 3) Continue to monitor HGB.  ADDENDUM: No leak noted with the  esophagram.    LOS: 22 days   Gunnison Chahal D 08/17/2024, 8:57 AM

## 2024-08-18 ENCOUNTER — Encounter (HOSPITAL_COMMUNITY): Payer: Self-pay | Admitting: Gastroenterology

## 2024-08-18 ENCOUNTER — Inpatient Hospital Stay (HOSPITAL_COMMUNITY)

## 2024-08-18 DIAGNOSIS — I160 Hypertensive urgency: Secondary | ICD-10-CM | POA: Diagnosis not present

## 2024-08-18 DIAGNOSIS — R739 Hyperglycemia, unspecified: Secondary | ICD-10-CM | POA: Diagnosis not present

## 2024-08-18 DIAGNOSIS — N179 Acute kidney failure, unspecified: Secondary | ICD-10-CM | POA: Diagnosis not present

## 2024-08-18 DIAGNOSIS — K9189 Other postprocedural complications and disorders of digestive system: Secondary | ICD-10-CM | POA: Diagnosis not present

## 2024-08-18 LAB — CBC
HCT: 25.7 % — ABNORMAL LOW (ref 36.0–46.0)
HCT: 24.7 % — ABNORMAL LOW (ref 36.0–46.0)
Hemoglobin: 8.8 g/dL — ABNORMAL LOW (ref 12.0–15.0)
Hemoglobin: 8.1 g/dL — ABNORMAL LOW (ref 12.0–15.0)
MCH: 31.7 pg (ref 26.0–34.0)
MCH: 30.7 pg (ref 26.0–34.0)
MCHC: 34.2 g/dL (ref 30.0–36.0)
MCHC: 32.8 g/dL (ref 30.0–36.0)
MCV: 92.4 fL (ref 80.0–100.0)
MCV: 93.6 fL (ref 80.0–100.0)
Platelets: 165 K/uL (ref 150–400)
Platelets: 158 K/uL (ref 150–400)
RBC: 2.78 MIL/uL — ABNORMAL LOW (ref 3.87–5.11)
RBC: 2.64 MIL/uL — ABNORMAL LOW (ref 3.87–5.11)
RDW: 15.5 % (ref 11.5–15.5)
RDW: 15.6 % — ABNORMAL HIGH (ref 11.5–15.5)
WBC: 16.8 K/uL — ABNORMAL HIGH (ref 4.0–10.5)
WBC: 16.9 K/uL — ABNORMAL HIGH (ref 4.0–10.5)
nRBC: 0 % (ref 0.0–0.2)
nRBC: 0 % (ref 0.0–0.2)

## 2024-08-18 LAB — AEROBIC/ANAEROBIC CULTURE W GRAM STAIN (SURGICAL/DEEP WOUND): Culture: NORMAL

## 2024-08-18 LAB — BASIC METABOLIC PANEL WITH GFR
Anion gap: 11 (ref 5–15)
Anion gap: 13 (ref 5–15)
BUN: 62 mg/dL — ABNORMAL HIGH (ref 8–23)
BUN: 66 mg/dL — ABNORMAL HIGH (ref 8–23)
CO2: 24 mmol/L (ref 22–32)
CO2: 23 mmol/L (ref 22–32)
Calcium: 7.9 mg/dL — ABNORMAL LOW (ref 8.9–10.3)
Calcium: 7.8 mg/dL — ABNORMAL LOW (ref 8.9–10.3)
Chloride: 106 mmol/L (ref 98–111)
Chloride: 104 mmol/L (ref 98–111)
Creatinine, Ser: 2.58 mg/dL — ABNORMAL HIGH (ref 0.44–1.00)
Creatinine, Ser: 2.7 mg/dL — ABNORMAL HIGH (ref 0.44–1.00)
GFR, Estimated: 20 mL/min — ABNORMAL LOW
GFR, Estimated: 19 mL/min — ABNORMAL LOW
Glucose, Bld: 159 mg/dL — ABNORMAL HIGH (ref 70–99)
Glucose, Bld: 152 mg/dL — ABNORMAL HIGH (ref 70–99)
Potassium: 3.6 mmol/L (ref 3.5–5.1)
Potassium: 4 mmol/L (ref 3.5–5.1)
Sodium: 141 mmol/L (ref 135–145)
Sodium: 140 mmol/L (ref 135–145)

## 2024-08-18 LAB — GLUCOSE, CAPILLARY
Glucose-Capillary: 171 mg/dL — ABNORMAL HIGH (ref 70–99)
Glucose-Capillary: 281 mg/dL — ABNORMAL HIGH (ref 70–99)
Glucose-Capillary: 198 mg/dL — ABNORMAL HIGH (ref 70–99)
Glucose-Capillary: 162 mg/dL — ABNORMAL HIGH (ref 70–99)
Glucose-Capillary: 168 mg/dL — ABNORMAL HIGH (ref 70–99)

## 2024-08-18 LAB — TYPE AND SCREEN
ABO/RH(D): A POS
Antibody Screen: NEGATIVE

## 2024-08-18 LAB — PROTIME-INR
INR: 1 (ref 0.8–1.2)
Prothrombin Time: 13.7 s (ref 11.4–15.2)

## 2024-08-18 LAB — PHOSPHORUS: Phosphorus: 2.7 mg/dL (ref 2.5–4.6)

## 2024-08-18 MED ORDER — POTASSIUM CHLORIDE CRYS ER 20 MEQ PO TBCR: 40.0000 meq | EXTENDED_RELEASE_TABLET | ORAL | Status: DC

## 2024-08-18 MED ORDER — TRACE MINERALS CU-MN-SE-ZN 300-55-60-3000 MCG/ML IV SOLN
INTRAVENOUS | Status: DC
  Filled 2024-08-18: qty 672

## 2024-08-18 MED ORDER — METOPROLOL TARTRATE 25 MG/10 ML ORAL SUSPENSION
50.0000 mg | Freq: Two times a day (BID) | ORAL | Status: DC
  Administered 2024-08-18: 50 mg via ORAL
  Filled 2024-08-18: qty 20

## 2024-08-18 MED ORDER — POTASSIUM CHLORIDE CRYS ER 20 MEQ PO TBCR
40.0000 meq | EXTENDED_RELEASE_TABLET | Freq: Once | ORAL | Status: AC
  Administered 2024-08-18: 40 meq via ORAL
  Filled 2024-08-18: qty 2

## 2024-08-18 MED ORDER — GLUCERNA SHAKE PO LIQD
237.0000 mL | Freq: Three times a day (TID) | ORAL | Status: DC
  Administered 2024-08-18 – 2024-08-19 (×3): 237 mL via ORAL

## 2024-08-18 MED ORDER — POTASSIUM CHLORIDE 20 MEQ PO PACK
40.0000 meq | PACK | ORAL | Status: DC
  Administered 2024-08-18: 40 meq via ORAL
  Filled 2024-08-18: qty 2

## 2024-08-18 MED ORDER — INSULIN ASPART 100 UNIT/ML IJ SOLN
0.0000 [IU] | INTRAMUSCULAR | Status: DC
  Administered 2024-08-18: 8 [IU] via SUBCUTANEOUS
  Administered 2024-08-18 – 2024-08-19 (×3): 3 [IU] via SUBCUTANEOUS
  Administered 2024-08-19: 5 [IU] via SUBCUTANEOUS
  Administered 2024-08-19: 2 [IU] via SUBCUTANEOUS
  Filled 2024-08-18: qty 1
  Filled 2024-08-18 (×3): qty 3
  Filled 2024-08-18: qty 2
  Filled 2024-08-18: qty 8
  Filled 2024-08-18: qty 5

## 2024-08-18 MED ORDER — METOPROLOL TARTRATE 25 MG/10 ML ORAL SUSPENSION
25.0000 mg | Freq: Two times a day (BID) | ORAL | Status: DC
  Administered 2024-08-18: 25 mg via ORAL
  Filled 2024-08-18: qty 10

## 2024-08-18 MED ORDER — HEPARIN SODIUM (PORCINE) 5000 UNIT/ML IJ SOLN
5000.0000 [IU] | Freq: Three times a day (TID) | INTRAMUSCULAR | Status: DC
  Administered 2024-08-18 – 2024-08-24 (×20): 5000 [IU] via SUBCUTANEOUS
  Filled 2024-08-18 (×21): qty 1

## 2024-08-18 MED ORDER — FUROSEMIDE 10 MG/ML IJ SOLN
60.0000 mg | Freq: Two times a day (BID) | INTRAMUSCULAR | Status: DC
  Administered 2024-08-18 – 2024-08-20 (×5): 60 mg via INTRAVENOUS
  Filled 2024-08-18 (×5): qty 6

## 2024-08-18 MED ORDER — AMLODIPINE BESYLATE 5 MG PO TABS
5.0000 mg | ORAL_TABLET | Freq: Every day | ORAL | Status: DC
  Administered 2024-08-18: 5 mg via ORAL
  Filled 2024-08-18: qty 1

## 2024-08-18 NOTE — Progress Notes (Signed)
 Initial Nutrition Assessment  DOCUMENTATION CODES:   Severe malnutrition in context of acute illness/injury  INTERVENTION:   Recommend continuing TPN until diet advanced to solids (advanced past FL) and pt demonstrating consistent tolerance with adequate po intake (meeting at least 60% of needs).   TPN order per Pharmacy  Add Glucerna Shake po TID-likes Chocolate, each supplement provides 220 kcal and 10 grams of protein  Encouraged pt to take things slowly with regards to po intake given +nausea today, previous constipation with first BM since 1/13 overnight. Encouraged sipping on liquids throughout the day   NUTRITION DIAGNOSIS:   Severe Malnutrition related to acute illness as evidenced by moderate muscle depletion, energy intake < or equal to 50% for > or equal to 5 days.  Continue but being addressed via nutrition support, po diet  GOAL:   Patient will meet greater than or equal to 90% of their needs  Progressing  MONITOR:   Diet advancement, Supplement acceptance, PO intake, Weight trends  REASON FOR ASSESSMENT:   Consult New TPN/TNA  ASSESSMENT:   Pt with PMH significant for: T2DM, HLD, HTN, sleep apnea, anxiety and depression. 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 Now s/p right middle lobectomy and resection of esophageal diverticulum.  12/29 admitted; OR: middle lobectomy and resection of esophageal diverticulum 12/30 esophagram: small contained esophageal leak 12/31 PICC placed; TPN started 1/06 aspirated contrast - esophagram unable to be completed 1/07 esophageal stent placed; TPN at goal 1/08 diet adv CLD  1/09 reports of excessive coughing; esophageal stent found to be in wrong position 1/10 adjustment to esophageal stent 1/12 repeat esophagram; diet advanced to clear liquid diet 1/13 - diet advanced DYS1/thins - increased coughing 1/14 - NPO; esophageal stent migration 1/15 - CT: displaced  esophageal stent, small R pleural effusion 1/16 - EGD w/ stent replacement, bronch 1/17 - TPN restarted 1/20 EGD and Esophogram with no signs of leak, diet advanced to CL 1/21 Diet adv to FL, +nausea  TPN continues, currently at rate of 60 ml/hr (provides 101 g of protein and 1743 kcals)  Diet advanced to CL yesterday, upgraded to FL this AM. On visit today, pt appears uncomfortable. Pt states she feels overly full after eating. Pt also indicating +nausea, RD notified RN-phenergan  given.   Denies abdominal pain, reports she just feels bloated. Last BM overnight; +BMs x 2, las BM with some blood (per pt she has hemorrhoids). Prior to this, no documented BM since 1/13.  Continue to monitor, may need to consider scheduled bowel regimen   Pt drank a Glucerna shake this AM and a carton of 2% Milk. Noted untouched FL tray from AM  (mid morning delivery) but pt also got a CL tray for breakfast. Pt reports she was taking in a variety of things but RN reports not much intake from meal trays today. Pt reports she is thirsty, so wanting to drink fluids.   Encouraged pt to take oral intake slowly, taking sips and bites throughout the day, especially while experiencing nausea. Want to prevent emesis if possible.   CBGs >200, noted insulin  not being added to TPN . Currently on ss novolog , lantus  15 units BID  UOP 2.8 L in 24 hours, receiving lasix  today Chest tube with minimal output  Labs: BUN 62 Creatinine 2.57  Sodium 141 (wdl) Potassium 3.6 (wdl) Phosphorus 2.7 (wdl) Magnesium  2.0 (wdl) CBGs 171-281   Meds:   Diet Order:   Diet Order  Diet full liquid Room service appropriate? Yes; Fluid consistency: Thin  Diet effective now                   EDUCATION NEEDS:   Education needs have been addressed  Skin:  Skin Assessment: Reviewed RN Assessment  Last BM:  1/21  Height:   Ht Readings from Last 1 Encounters:  08/18/24 5' 3 (1.6 m)    Weight:   Wt  Readings from Last 1 Encounters:  08/18/24 73.9 kg    Ideal Body Weight:  52.3 kg  BMI:  Body mass index is 28.86 kg/m.  Estimated Nutritional Needs:   Kcal:  1700-1900  Protein:  100-115g  Fluid:  >1.7L/day   Betsey Finger MS, RDN, LDN, CNSC Registered Dietitian 3 Clinical Nutrition RD Inpatient Contact Info in Amion

## 2024-08-18 NOTE — Progress Notes (Signed)
 Patient ID: [name], female   DOB: 08/27/1955, 69 y.o.   MRN: [id number] [facility] Progress Note   Assessment/ Plan:   1. Acute kidney Injury: Nonoliguric overnight with creatinine that appears to be 2.3-2.6 and possibly at the plateau phase of ATN (suspected renal injury from a combination of hemodynamic and nephrotoxic mechanisms).  Serologies negative for GN including vasculitis/anti-GBM given previous concerns of gross hematuria.  Awaiting labs from this morning to assess renal function, she remains nonoliguric and without any clinical indications for dialysis. 2.  Esophageal leak following resection of esophageal diverticulum: Underwent placement of a new esophageal stent previous one migrated distally into the stomach.  EGD and esophagram done yesterday showing no leak with plans for possible advancement of diet. 3.  Hypertensive urgency: Improved blood pressures noted on nicardipine  drip.  Will transition to oral antihypertensive therapy when cleared for swallowing. 4. Anemia: related to ongoing losses including hematuria.   Subjective:   Uneventful overnight, having intermittent discomfort and anxiety   Objective:   BP (!) 141/58   Pulse (!) 106   Temp 98.1 F (36.7 C) (Oral)   Resp 15   Ht 5' 3 (1.6 m)   Wt 73.9 kg   LMP 10/26/1998 Comment: ~IN 40s  SpO2 97%   BMI 28.86 kg/m   Intake/Output Summary (Last 24 hours) at 08/18/2024 0800 Last data filed at 08/18/2024 0700 Gross per 24 hour  Intake 3990.05 ml  Output 2830 ml  Net 1160.05 ml   Weight change: 2 kg  Physical Exam: Gen: Resting comfortably in bed, awakens to voice CVS: Pulse regular rhythm, normal rate, S1 and S2 normal Resp: Diminished breath sounds with poor inspiratory effort over bases.  Chest tube in situ Abd: Soft, nontender, bowel sounds normal Ext: No lower extremity edema  Imaging: DG Chest Port 1 View Result Date: 08/18/2024 EXAM: 1 VIEW XRAY OF THE CHEST 08/18/2024 05:23:00 AM  COMPARISON: 08/17/2024 05:22:00 AM CLINICAL HISTORY: [id number] S/P lobectomy of lung [id number] FINDINGS: LINES, TUBES AND DEVICES: The right PICC terminates at the superior cavoatrial junction. There are overlying monitor leads. LUNGS AND PLEURA: Patchy bilateral airspace disease is again noted with small pleural effusions. No new or worsening lung findings. Overall aeration seems unchanged. HEART AND MEDIASTINUM: The cardiac size is normal. Stable mediastinal with calcification of the transverse aorta and upper to mid esophageal stenting. BONES AND SOFT TISSUES: No acute osseous abnormality. IMPRESSION: 1. Patchy bilateral airspace disease with small pleural effusions, stable compared to the prior study. 2. Right PICC terminates at the superior cavoatrial junction. Electronically signed by: Francis Quam MD 08/18/2024 05:44 AM EST RP Workstation: [id number]   DG ESOPHAGUS W SINGLE CM (SOL OR THIN BA) Result Date: 08/17/2024 CLINICAL DATA:  69 year old female with history of a right middle lobe lobectomy, and midthoracic esophageal diverticular resection on 07/26/24 complicated by persistent esophageal perforation. Patient is status post esophageal stent placement on 08/04/24 which migrated into the stomach. A new stent was placed on 08/13/24, with old migrated stent removed at that time. Subsequent esophagram on 08/15/23 demonstrated persistent leak from the right distal end of esophageal stent. IR is requested for repeat esophagram to check for persistent leak. EXAM: ESOPHAGUS/BARIUM SWALLOW/TABLET STUDY TECHNIQUE: Single contrast examination was performed using thin liquid barium. This exam was performed by Carlin Griffon, PA-C, and was supervised and interpreted by Dr. Ree Molt, MD. FLUOROSCOPY: Radiation Exposure Index (as provided by the fluoroscopic device): 20.60 mGy Kerma COMPARISON:  DG esophagus 08/14/24; CT Chest Abdomen  Pelvis w/o cm on 08/12/24. FINDINGS: Swallowing: Appears normal. No vestibular  penetration or aspiration seen. Pharynx: Unremarkable. Esophagus: Normal appearance. Stent in place. No Peri stent leak noted. Esophageal motility: Mild dysmotility with stent in place. Hiatal Hernia: None noted. Gastroesophageal reflux: Not evaluated. Ingested 13mm barium tablet: Not given Other: Esophageal stent in place, as seen on prior imaging. IMPRESSION: *No leak noted. Electronically Signed   By: Ree Molt M.D.   On: 08/17/2024 12:23   DG CHEST PORT 1 VIEW Result Date: 08/17/2024 CLINICAL DATA:  Right pleural effusion. EXAM: PORTABLE CHEST 1 VIEW COMPARISON:  08/16/2024 FINDINGS: Stable cardiopericardial silhouette. Interval progression of bibasilar airspace disease, likely atelectatic as is second film performed as part of this exam shows improved aeration both lung bases. Probable tiny right pleural effusion. Patchy right greater than left airspace disease in the mid and upper lungs is similar. Right PICC line tip overlies the proximal to mid SVC level. Right chest tube remains in place. IMPRESSION: 1. Similar bibasilar airspace disease, a component of which is atelectatic. 2. Probable tiny right pleural effusion. 3. Stable patchy right greater than left airspace disease in the mid and upper lungs. Electronically Signed   By: Camellia Candle M.D.   On: 08/17/2024 06:10    Labs: BMET Recent Labs  Lab 08/12/24 1500 08/13/24 1000 08/14/24 0500 08/15/24 0050 08/15/24 1800 08/16/24 0500 08/17/24 0958  NA 135 137 140 142 144 142 143  K 3.5 3.9 4.0 3.6 3.3* 3.5 3.4*  CL 105 105 107 111 109 107 108  CO2 22 21* 18* 21* 22 25 23   GLUCOSE 96 93 142* 246* 135* 122* 226*  BUN 24* 30* 39* 50* 50* 46* 58*  CREATININE 1.54* 2.01* 2.34* 2.55* 2.39* 2.55* 2.72*  CALCIUM 7.0* 8.1* 8.0* 7.6* 8.1* 8.2* 8.1*  PHOS  --   --   --  3.3  --  2.2* 2.1*   CBC Recent Labs  Lab 08/14/24 1325 08/14/24 1923 08/16/24 0500 08/17/24 0515 08/18/24 0207 08/18/24 0742  WBC 10.8*   < > 12.2* 7.0 16.8*  16.9*  NEUTROABS 8.4*  --   --   --   --   --   HGB 9.5*   < > 9.1* 8.6* 8.8* 8.1*  HCT 27.9*   < > 27.5* 25.5* 25.7* 24.7*  MCV 93.3   < > 92.0 95.5 92.4 93.6  PLT 198   < > 157 139* 165 158   < > = values in this interval not displayed.    Medications:     Chlorhexidine  Gluconate Cloth  6 each Topical Daily   cloNIDine   0.3 mg Transdermal Weekly   fentaNYL   1 patch Transdermal Q72H   hydrALAZINE   10 mg Intravenous Q6H   insulin  aspart  0-15 Units Subcutaneous Q6H   insulin  glargine  15 Units Subcutaneous BID   ipratropium-albuterol   3 mL Nebulization Q6H   neomycin -bacitracin -polymyxin   Topical BID   nitroGLYCERIN   0.3 mg Transdermal Daily   pantoprazole  (PROTONIX ) IV  40 mg Intravenous Q12H   sodium chloride  flush  10-40 mL Intracatheter Q12H    Gordy Blanch, MD 08/18/2024, 8:00 AM

## 2024-08-18 NOTE — Progress Notes (Signed)
 TCTS PM Rounding Progress Note  No issues.  Sleeping soundly Off nicardipine , still somewhat tachycardic and hypertensive No additional bloody BM today  Vitals:   08/18/24 1800 08/18/24 1900  BP: (!) 161/72 (!) 152/76  Pulse: (!) 114 (!) 114  Resp: 15 16  Temp:    SpO2: 97% 97%    Plan: - Increase metop to 25 mg tonight  Con Clunes, MD Cardiothoracic Surgery Pager: [phone]

## 2024-08-18 NOTE — Progress Notes (Signed)
 "  NAME:  [name], MRN:  [id number], DOB:  1955-11-04, LOS: 23 ADMISSION DATE:  07/26/2024, CONSULTATION DATE:  1/15 REFERRING MD:  Kerrin, CHIEF COMPLAINT:  sepsis   History of Present Illness:  [name] is a 69 y/o woman with a history of tobacco abuse and lung adenocarcinoma status post RML lobectomy on 12/29.  At the time of surgery was a large cyst that was resected. Despite oversewing it time resection she later developed an esophageal leak.  She required an esophageal stent which she has had recurrent problems with related to positioning/migration of the stent (necessitating multiple EGDs for stent revision). From 1/15 through 1/18, the patient was consistently total body balance positive in setting of receiving NS @ 100 mL/hr and then starting TPN as of 1/18. Due to concomitant AKI, developed volume overload and pulmonary edema and hypertensive urgency ultimately requiring a Cardene  drip be started overnight 1/19. Also on 1/19, she underwent revision of the position of her esophageal stent to address a small esophageal leak.  Pertinent  Medical History  Lung adenocarcinoma Latent  TB, treated in 1980 HTN HLD DM OSA Tobacco abuse  Significant Hospital Events: Including procedures, antibiotic start and stop dates in addition to other pertinent events   12/29 RML lobectomy 1/7 EGD with esophageal stent 1/9 EGD  with stent reposition after migration to stomach 1/15 vanc added, zosyn  restarted. CT  1/19: EGD for stent repositioning due to ongoing small esophageal leak / stent migration 1/20: Gastrograffin swallow => no esophageal leak. Advanced to clear liquids. 1/21: Advanced to full liquid diet by GI. Lasix  60 mg IV but ended day net positive.  Interim History / Subjective:  Due to the stent appearing to be in a relatively unchanged position at least by CXR, a gastrograffin study was performed => this showed no leak.  Lasix  60 mg IV given this AM with plan for second dose  in the PM.  Objective    Blood pressure 134/66, pulse 100, temperature 98.1 F (36.7 C), temperature source Oral, resp. rate 15, height 5' 3 (1.6 m), weight 73.9 kg, last menstrual period 10/26/1998, SpO2 96%.        Intake/Output Summary (Last 24 hours) at 08/18/2024 1256 Last data filed at 08/18/2024 1200 Gross per 24 hour  Intake 4613.36 ml  Output 2620 ml  Net 1993.36 ml   Filed Weights   08/16/24 0518 08/17/24 0500 08/18/24 0630  Weight: 72.2 kg 71.9 kg 73.9 kg    Examination: General: chronically ill appearing woman lying in bed in NAD, A&Ox3, on 4-5L [place] HENT: [place]/AT, eyes anicteric Lungs: Breathing comfortably, no rhonchi. Cardiovascular: S1S2, RRR  Abdomen: soft, NT Extremities: no peripheral edema Neuro: awake, alert, moving all extremities   I/Os: 4.1 / 2.8 => net +1.3 L y/day  Labs: Na 141 CO2 24 K 3.6 Cr 2.58 (stable to slightly lower than y/day)  Resolved problem list   Assessment and Plan   Esophageal leak - S/p esophageal stenting c/b migration, now s/p repositioning by GI most recently on 1/19. Gastrograffin swallow without leak => full liquid diet today. - R chest tube per TCTS - If able to take sufficient PO, will d/c TPN  AKI Hypertensive urgency - Probable component of ATN at this point - Nephrology following and also suspects ATN - She is grossly hypervolemic which is exacerbating hypertension for which she remains on a nicardipine  drip; Lasix  60 mg IV x2 today - Nicardipine  drip, hydralazine , clonidine  patch. Start amlodipine .  Lung adenocarcinoma,  s/p lobectomy -post op care per TCTS -pain control as ordered- fentanyl  patch, dilaudid   H/o tobacco abuse -recommend cessation -con't nicotine  patch  Hyperglycemia -SSI + glargine -goal BG 140-180   Labs   CBC: Recent Labs  Lab 08/14/24 1325 08/14/24 1923 08/15/24 0050 08/15/24 0539 08/15/24 1146 08/16/24 0500 08/17/24 0515 08/18/24 0207 08/18/24 0742  WBC 10.8*   < >  10.2  --   --  12.2* 7.0 16.8* 16.9*  NEUTROABS 8.4*  --   --   --   --   --   --   --   --   HGB 9.5*   < > 7.7*   < > 8.1* 9.1* 8.6* 8.8* 8.1*  HCT 27.9*   < > 23.0*   < > 24.2* 27.5* 25.5* 25.7* 24.7*  MCV 93.3   < > 93.1  --   --  92.0 95.5 92.4 93.6  PLT 198   < > 159  --   --  157 139* 165 158   < > = values in this interval not displayed.    Basic Metabolic Panel: Recent Labs  Lab 08/15/24 0050 08/15/24 1800 08/16/24 0500 08/17/24 0958 08/18/24 0742  NA 142 144 142 143 141  K 3.6 3.3* 3.5 3.4* 3.6  CL 111 109 107 108 106  CO2 21* 22 25 23 24   GLUCOSE 246* 135* 122* 226* 159*  BUN 50* 50* 46* 58* 62*  CREATININE 2.55* 2.39* 2.55* 2.72* 2.58*  CALCIUM 7.6* 8.1* 8.2* 8.1* 7.9*  MG 2.0  --  1.7 2.0  --   PHOS 3.3  --  2.2* 2.1* 2.7   GFR: Estimated Creatinine Clearance: 20.7 mL/min (A) (by C-G formula based on SCr of 2.58 mg/dL (H)). Recent Labs  Lab 08/16/24 0500 08/17/24 0515 08/18/24 0207 08/18/24 0742  WBC 12.2* 7.0 16.8* 16.9*     Critical care time:    Total critical care time spent by me: 31 minutes   Critical care time was exclusive of separately billable procedures and the treatment of any other patient.   Critical care was necessary to treat or prevent imminent or life-threatening deterioration.  Critical care was time spent personally by me on the following activities: development of treatment plan with patient and/or surrogate as well as nursing, discussions with consultants, re-evaluation of the patient's condition and their response to treatment, examination of patient, obtaining history from patient or surrogate, ordering and performing treatments and interventions, ordering and review of laboratory studies, ordering and review of radiographic studies, and participation in multidisciplinary rounds.  Lamar Dales, MD Pulmonary, Critical Care & Sleep Medicine [facility]  7a-7p: For contact information, see AMION. If no response to  pager, please call PCCM 2-H APP. After 7p: Please call PCCM APP on-call for 2-H.   "

## 2024-08-18 NOTE — Progress Notes (Signed)
 Subjective: No complaints with swallowing.  She is tolerating clear liquids.  Objective: Vital signs in last 24 hours: Temp:  [97.9 F (36.6 C)-98.8 F (37.1 C)] 97.9 F (36.6 C) (01/21 0800) Pulse Rate:  [94-122] 106 (01/21 0630) Resp:  [12-29] 15 (01/21 0630) BP: (123-166)/(54-137) 141/58 (01/21 0630) SpO2:  [92 %-97 %] 97 % (01/21 0630) Weight:  [73.9 kg] 73.9 kg (01/21 0630) Last BM Date : (S) 08/18/24  Intake/Output from previous day: 01/20 0701 - 01/21 0700 In: 4126 [P.O.:340; I.V.:2790.6; IV Piggyback:995.4] Out: 2830 [Urine:2800; Chest Tube:30] Intake/Output this shift: No intake/output data recorded.  General appearance: alert and no distress GI: soft, non-tender; bowel sounds normal; no masses,  no organomegaly  Lab Results: Recent Labs    08/17/24 0515 08/18/24 0207 08/18/24 0742  WBC 7.0 16.8* 16.9*  HGB 8.6* 8.8* 8.1*  HCT 25.5* 25.7* 24.7*  PLT 139* 165 158   BMET Recent Labs    08/15/24 1800 08/16/24 0500 08/17/24 0958  NA 144 142 143  K 3.3* 3.5 3.4*  CL 109 107 108  CO2 22 25 23   GLUCOSE 135* 122* 226*  BUN 50* 46* 58*  CREATININE 2.39* 2.55* 2.72*  CALCIUM 8.1* 8.2* 8.1*   LFT Recent Labs    08/16/24 0500  PROT 5.4*  ALBUMIN  2.8*  AST 25  ALT 16  ALKPHOS 51  BILITOT 0.4   PT/INR Recent Labs    08/18/24 0742  LABPROT 13.7  INR 1.0   Hepatitis Panel No results for input(s): HEPBSAG, HCVAB, HEPAIGM, HEPBIGM in the last 72 hours. C-Diff No results for input(s): CDIFFTOX in the last 72 hours. Fecal Lactopherrin No results for input(s): FECLLACTOFRN in the last 72 hours.  Studies/Results: DG Chest Port 1 View Result Date: 08/18/2024 EXAM: 1 VIEW XRAY OF THE CHEST 08/18/2024 05:23:00 AM COMPARISON: 08/17/2024 05:22:00 AM CLINICAL HISTORY: [id number] S/P lobectomy of lung [id number] FINDINGS: LINES, TUBES AND DEVICES: The right PICC terminates at the superior cavoatrial junction. There are overlying monitor leads. LUNGS AND  PLEURA: Patchy bilateral airspace disease is again noted with small pleural effusions. No new or worsening lung findings. Overall aeration seems unchanged. HEART AND MEDIASTINUM: The cardiac size is normal. Stable mediastinal with calcification of the transverse aorta and upper to mid esophageal stenting. BONES AND SOFT TISSUES: No acute osseous abnormality. IMPRESSION: 1. Patchy bilateral airspace disease with small pleural effusions, stable compared to the prior study. 2. Right PICC terminates at the superior cavoatrial junction. Electronically signed by: Francis Quam MD 08/18/2024 05:44 AM EST RP Workstation: [id number]   DG ESOPHAGUS W SINGLE CM (SOL OR THIN BA) Result Date: 08/17/2024 CLINICAL DATA:  69 year old female with history of a right middle lobe lobectomy, and midthoracic esophageal diverticular resection on 07/26/24 complicated by persistent esophageal perforation. Patient is status post esophageal stent placement on 08/04/24 which migrated into the stomach. A new stent was placed on 08/13/24, with old migrated stent removed at that time. Subsequent esophagram on 08/15/23 demonstrated persistent leak from the right distal end of esophageal stent. IR is requested for repeat esophagram to check for persistent leak. EXAM: ESOPHAGUS/BARIUM SWALLOW/TABLET STUDY TECHNIQUE: Single contrast examination was performed using thin liquid barium. This exam was performed by Carlin Griffon, PA-C, and was supervised and interpreted by Dr. Ree Molt, MD. FLUOROSCOPY: Radiation Exposure Index (as provided by the fluoroscopic device): 20.60 mGy Kerma COMPARISON:  DG esophagus 08/14/24; CT Chest Abdomen Pelvis w/o cm on 08/12/24. FINDINGS: Swallowing: Appears normal. No vestibular penetration or aspiration seen. Pharynx:  Unremarkable. Esophagus: Normal appearance. Stent in place. No Peri stent leak noted. Esophageal motility: Mild dysmotility with stent in place. Hiatal Hernia: None noted. Gastroesophageal reflux:  Not evaluated. Ingested 13mm barium tablet: Not given Other: Esophageal stent in place, as seen on prior imaging. IMPRESSION: *No leak noted. Electronically Signed   By: Ree Molt M.D.   On: 08/17/2024 12:23   DG CHEST PORT 1 VIEW Result Date: 08/17/2024 CLINICAL DATA:  Right pleural effusion. EXAM: PORTABLE CHEST 1 VIEW COMPARISON:  08/16/2024 FINDINGS: Stable cardiopericardial silhouette. Interval progression of bibasilar airspace disease, likely atelectatic as is second film performed as part of this exam shows improved aeration both lung bases. Probable tiny right pleural effusion. Patchy right greater than left airspace disease in the mid and upper lungs is similar. Right PICC line tip overlies the proximal to mid SVC level. Right chest tube remains in place. IMPRESSION: 1. Similar bibasilar airspace disease, a component of which is atelectatic. 2. Probable tiny right pleural effusion. 3. Stable patchy right greater than left airspace disease in the mid and upper lungs. Electronically Signed   By: Camellia Candle M.D.   On: 08/17/2024 06:10    Medications: Scheduled:  Chlorhexidine  Gluconate Cloth  6 each Topical Daily   cloNIDine   0.3 mg Transdermal Weekly   fentaNYL   1 patch Transdermal Q72H   hydrALAZINE   10 mg Intravenous Q6H   insulin  aspart  0-15 Units Subcutaneous Q6H   insulin  glargine  15 Units Subcutaneous BID   ipratropium-albuterol   3 mL Nebulization Q6H   neomycin -bacitracin -polymyxin   Topical BID   nitroGLYCERIN   0.3 mg Transdermal Daily   pantoprazole  (PROTONIX ) IV  40 mg Intravenous Q12H   sodium chloride  flush  10-40 mL Intracatheter Q12H   Continuous:  fluconazole  (DIFLUCAN ) IV Stopped (08/17/24 1153)   niCARDipine  5 mg/hr (08/18/24 0817)   piperacillin -tazobactam (ZOSYN )  IV 12.5 mL/hr at 08/18/24 0700   promethazine  (PHENERGAN ) injection (IM or IVPB) Stopped (08/14/24 2051)   TPN ADULT (ION) 60 mL/hr at 08/18/24 0700    Assessment/Plan: 1) Esophageal leak s/p  repositioning of the stent. 2) Anemia - relatively stable. 3) AKI.   From the GI standpoint she appears stable.  There was no evidence of any leak with the esophagram yesterday.  She did not have any documented fevers and the chest tube output was minimal.  Plan: 1) Advance to a full liquid diet. 2) Continue to monitor HGB.  LOS: 23 days   Veda Arrellano D 08/18/2024, 8:25 AM

## 2024-08-18 NOTE — Progress Notes (Signed)
 PHARMACY - TOTAL PARENTERAL NUTRITION CONSULT NOTE  Indication: Esophageal leak  Patient Measurements: Height: 5' 3 (160 cm) Weight: 73.9 kg (162 lb 14.7 oz) IBW/kg (Calculated) : 52.4 TPN AdjBW (KG): 57.8 Body mass index is 28.86 kg/m.  Assessment:  47 YOF with NSCLC here for RML lobectomy, resection of esophageal diverticulum, and lymph node dissection on 12/29.  Found to have a small contained leak post-op.   Patient reports an intentional weight loss of 40 lbs over a couple of years when she was diagnosed with DM.  She typically drinks one Orgain shake every morning for breakfast and eats one meal a day, which could be pizza, spaghetti, chicken, beef or fish along with vegetables and fruits.  Patient was eating normally until her planned surgery. RD concerned with patient being malnourished. Patient was on TPN 12/31 - 1/12 and was advanced to dysphagia 1 diet. However, she was NPO again 1/14-1/16. On 1/17 IR reported small amount of leak from distal esophageal stent. Patient developed AKI since TPN was held. Pharmacy reconsulted for TPN 1/17.  Nephrology following. Serologies negative. Suspect plateau phase of ATN. No RRT. Making urine with lasix . Advancing to full liquid diet given no leak on esophagram.    Glucose / Insulin : hx DM, A1c 5.9% - BG 159-266. CT surgery added 15 units long acting insulin  BID, glucose better. mSSI q6h (19 units used).  Electrolytes: Na 141, K 3.6 (replacing outside of TPN given ongoing lasix  use), CO2 24 (1:1 TPN), phos improved to 2.7 after supplementation / small increase in TPN  Renal: SCr 2.58, improving (BL 0.8), BUN 62 Hepatic: LFTs / tbili WNL, albumin  2.8, TG 211 1/19 Intake / Output; MIVF: Fluids off; UOP 2.8 L (1.6 ml/kg/hr) charted after lasix  60 mg yesterday, LBM 1/20, chest tube 30 mL.   Net IO Since Admission: 19,104.89 mL [08/18/24 0926]  GI Imaging:   12/30 esophagram: Positive for contrast extravasation and an esophageal leak in the mid  esophagus 1/2 esophagram: Persistent contrast extravasation and esophageal leak, similar to prior 1/6 esophagram: Limited contrast study due to patient aspiration and severe coughing spell. Study aborted. 1/12 esophagram: Patent esophageal stent. No evidence for extravasation or leak, Delayed esophageal emptying and gastroesophageal reflux leading to stasis of contrast in the esophagus. 1/15 CT: displaced esophageal stent in stomach, no bowel obstruction  1/17 esophagram: small volume esophageal leak at distal stent 1/20 esophagram: no evidence of any leak  GI Surgeries / Procedures:  12/30 right middle lobectomy, resection esophageal diverticulum, lymph node biopsy  1/7 EGD- stent placement  1/9 EGD stent repositioned d/t migration to stomach  1/16 EGD- stent placed at esophageal ulcer, removed gastric stent, normal duoenum  1/19 EGD - stent replaced / repositioned   Central access: PICC 07/28/24 TPN start date: 07/28/24> 1/12; 1/17 >>   Nutritional Goals:  Goal unconcentrated TPN rate 75 ml/hr to provide 101g AA and 1721 kCal Goal concentrated TPN rate 60 mL/hr to provide 101 g AA and 1743 kCal  RD Estimated Needs Total Energy Estimated Needs: 1700-1900 Total Protein Estimated Needs: 100-115g Total Fluid Estimated Needs: >1.7L/day  Current Nutrition:  TPN  1/08 CLD - passed swallow 1/09 CLD - patient feels can't swallow well > NPO 1/13 advanced to DYS1 1/14- NPO  1/21 - full liquid diet   Plan: 40 mEq PO KCL x 2 doses today given lasix  use (60 BID today)  Continue concentrated TPN at goal 60 mL/hr at 1800, meeting 100% estimated needs. Follow oral intake for transition  to oral diet and off of TPN.  Electrolytes in TPN: Na 75 mEq/L, K 35 mEq/L, Ca 3 mEq/L, Mg 3 mEq/L, and Phos 10 mmol/L. Cl:Ac 1:1  Add standard MVI and trace elements to TPN Change Moderate SSI to q4h and adjust as needed. Noted CT surgery addition of Lantus  15 BID. Will not add insulin  to TPN at this time  given contribution of oral diet to blood glucose elevation.  Monitor TPN labs on Mon/Thurs, and PRN  Thank you for allowing pharmacy to be a part of this patients care.  Rankin Sams, PharmD, BCCCP Clinical Pharmacist

## 2024-08-18 NOTE — Progress Notes (Addendum)
 2 Days Post-Op Procedures (LRB): EGD (ESOPHAGOGASTRODUODENOSCOPY) (N/A) CONTROL OF HEMORRHAGE, GI TRACT, ENDOSCOPIC, BY CLIPPING OR OVERSEWING Subjective: C/o dry lips No pain with POs  Objective: Vital signs in last 24 hours: Temp:  [97.9 F (36.6 C)-98.8 F (37.1 C)] 97.9 F (36.6 C) (01/21 0800) Pulse Rate:  [94-122] 121 (01/21 0830) Cardiac Rhythm: Sinus tachycardia (01/21 0800) Resp:  [12-29] 20 (01/21 0830) BP: (123-166)/(54-79) 155/73 (01/21 0830) SpO2:  [92 %-98 %] 96 % (01/21 0830) Weight:  [73.9 kg] 73.9 kg (01/21 0630)  Hemodynamic parameters for last 24 hours:    Intake/Output from previous day: 01/20 0701 - 01/21 0700 In: 4126 [P.O.:340; I.V.:2790.6; IV Piggyback:995.4] Out: 2830 [Urine:2800; Chest Tube:30] Intake/Output this shift: Total I/O In: 235.8 [P.O.:120; I.V.:103.3; IV Piggyback:12.5] Out: -   General appearance: alert, cooperative, and no distress Neurologic: intact Heart: tachy, regular Lungs: diminished breath sounds right base  Lab Results: Recent Labs    08/18/24 0207 08/18/24 0742  WBC 16.8* 16.9*  HGB 8.8* 8.1*  HCT 25.7* 24.7*  PLT 165 158   BMET:  Recent Labs    08/17/24 0958 08/18/24 0742  NA 143 141  K 3.4* 3.6  CL 108 106  CO2 23 24  GLUCOSE 226* 159*  BUN 58* 62*  CREATININE 2.72* 2.58*  CALCIUM 8.1* 7.9*    PT/INR:  Recent Labs    08/18/24 0742  LABPROT 13.7  INR 1.0   ABG    Component Value Date/Time   TCO2 25 11/10/2009 0952   CBG (last 3)  Recent Labs    08/17/24 1801 08/17/24 2341 08/18/24 0542  GLUCAP 266* 176* 171*    Assessment/Plan: S/P Procedures (LRB): EGD (ESOPHAGOGASTRODUODENOSCOPY) (N/A) CONTROL OF HEMORRHAGE, GI TRACT, ENDOSCOPIC, BY CLIPPING OR OVERSEWING - Esophagram yesterday showed no leak Tolerated clears Will advance to full liquids Keep CT in place Will start PO Lopressor  suspension  Wean Cardene  drip as Bp allows Mobilize  WBC elevated at 16K, but Afebrile on Zosyn   and Diflucan , monitor    LOS: 23 days    [name] 08/18/2024

## 2024-08-19 ENCOUNTER — Inpatient Hospital Stay (HOSPITAL_COMMUNITY)

## 2024-08-19 ENCOUNTER — Other Ambulatory Visit: Payer: Self-pay | Admitting: Thoracic Surgery (Cardiothoracic Vascular Surgery)

## 2024-08-19 DIAGNOSIS — F17210 Nicotine dependence, cigarettes, uncomplicated: Secondary | ICD-10-CM | POA: Diagnosis not present

## 2024-08-19 DIAGNOSIS — I160 Hypertensive urgency: Secondary | ICD-10-CM | POA: Diagnosis not present

## 2024-08-19 DIAGNOSIS — Z902 Acquired absence of lung [part of]: Secondary | ICD-10-CM | POA: Diagnosis not present

## 2024-08-19 DIAGNOSIS — R739 Hyperglycemia, unspecified: Secondary | ICD-10-CM | POA: Diagnosis not present

## 2024-08-19 DIAGNOSIS — E8770 Fluid overload, unspecified: Secondary | ICD-10-CM

## 2024-08-19 DIAGNOSIS — N170 Acute kidney failure with tubular necrosis: Secondary | ICD-10-CM | POA: Diagnosis not present

## 2024-08-19 DIAGNOSIS — C342 Malignant neoplasm of middle lobe, bronchus or lung: Secondary | ICD-10-CM

## 2024-08-19 DIAGNOSIS — K9189 Other postprocedural complications and disorders of digestive system: Secondary | ICD-10-CM | POA: Diagnosis not present

## 2024-08-19 LAB — COMPREHENSIVE METABOLIC PANEL WITH GFR
ALT: 24 U/L (ref 0–44)
AST: 37 U/L (ref 15–41)
Albumin: 2.8 g/dL — ABNORMAL LOW (ref 3.5–5.0)
Alkaline Phosphatase: 47 U/L (ref 38–126)
Anion gap: 13 (ref 5–15)
BUN: 66 mg/dL — ABNORMAL HIGH (ref 8–23)
CO2: 25 mmol/L (ref 22–32)
Calcium: 8.3 mg/dL — ABNORMAL LOW (ref 8.9–10.3)
Chloride: 101 mmol/L (ref 98–111)
Creatinine, Ser: 2.66 mg/dL — ABNORMAL HIGH (ref 0.44–1.00)
GFR, Estimated: 19 mL/min — ABNORMAL LOW
Glucose, Bld: 158 mg/dL — ABNORMAL HIGH (ref 70–99)
Potassium: 3.7 mmol/L (ref 3.5–5.1)
Sodium: 138 mmol/L (ref 135–145)
Total Bilirubin: 0.4 mg/dL (ref 0.0–1.2)
Total Protein: 5.6 g/dL — ABNORMAL LOW (ref 6.5–8.1)

## 2024-08-19 LAB — GLUCOSE, CAPILLARY
Glucose-Capillary: 131 mg/dL — ABNORMAL HIGH (ref 70–99)
Glucose-Capillary: 206 mg/dL — ABNORMAL HIGH (ref 70–99)
Glucose-Capillary: 182 mg/dL — ABNORMAL HIGH (ref 70–99)
Glucose-Capillary: 213 mg/dL — ABNORMAL HIGH (ref 70–99)
Glucose-Capillary: 221 mg/dL — ABNORMAL HIGH (ref 70–99)

## 2024-08-19 LAB — CBC
HCT: 22.5 % — ABNORMAL LOW (ref 36.0–46.0)
HCT: 24.7 % — ABNORMAL LOW (ref 36.0–46.0)
Hemoglobin: 7.5 g/dL — ABNORMAL LOW (ref 12.0–15.0)
Hemoglobin: 8.5 g/dL — ABNORMAL LOW (ref 12.0–15.0)
MCH: 32.6 pg (ref 26.0–34.0)
MCH: 31.4 pg (ref 26.0–34.0)
MCHC: 33.3 g/dL (ref 30.0–36.0)
MCHC: 34.4 g/dL (ref 30.0–36.0)
MCV: 97.8 fL (ref 80.0–100.0)
MCV: 91.1 fL (ref 80.0–100.0)
Platelets: 129 K/uL — ABNORMAL LOW (ref 150–400)
Platelets: 168 K/uL (ref 150–400)
RBC: 2.3 MIL/uL — ABNORMAL LOW (ref 3.87–5.11)
RBC: 2.71 MIL/uL — ABNORMAL LOW (ref 3.87–5.11)
RDW: 16.3 % — ABNORMAL HIGH (ref 11.5–15.5)
RDW: 15.8 % — ABNORMAL HIGH (ref 11.5–15.5)
WBC: 13.3 K/uL — ABNORMAL HIGH (ref 4.0–10.5)
WBC: 16 K/uL — ABNORMAL HIGH (ref 4.0–10.5)
nRBC: 0 % (ref 0.0–0.2)
nRBC: 0 % (ref 0.0–0.2)

## 2024-08-19 LAB — MAGNESIUM: Magnesium: 1.5 mg/dL — ABNORMAL LOW (ref 1.7–2.4)

## 2024-08-19 LAB — PHOSPHORUS: Phosphorus: 3.1 mg/dL (ref 2.5–4.6)

## 2024-08-19 MED ORDER — ONDANSETRON HCL 4 MG/2ML IJ SOLN
4.0000 mg | Freq: Four times a day (QID) | INTRAMUSCULAR | Status: DC
  Administered 2024-08-19 – 2024-08-23 (×18): 4 mg via INTRAVENOUS
  Filled 2024-08-19 (×18): qty 2

## 2024-08-19 MED ORDER — ORAL CARE MOUTH RINSE: 15.0000 mL | OROMUCOSAL | Status: DC | PRN

## 2024-08-19 MED ORDER — ACETAMINOPHEN 160 MG/5ML PO SOLN: 650.0000 mg | Freq: Four times a day (QID) | ORAL | Status: DC

## 2024-08-19 MED ORDER — HYDROMORPHONE HCL 2 MG PO TABS
4.0000 mg | ORAL_TABLET | Freq: Four times a day (QID) | ORAL | Status: DC
  Administered 2024-08-19 – 2024-08-21 (×7): 4 mg via ORAL
  Filled 2024-08-19 (×7): qty 2

## 2024-08-19 MED ORDER — MAGNESIUM SULFATE 2 GM/50ML IV SOLN: 2.0000 g | Freq: Once | INTRAVENOUS | Status: DC

## 2024-08-19 MED ORDER — METOPROLOL TARTRATE 50 MG PO TABS
50.0000 mg | ORAL_TABLET | Freq: Two times a day (BID) | ORAL | Status: DC
  Administered 2024-08-19 – 2024-08-20 (×4): 50 mg via ORAL
  Filled 2024-08-19 (×4): qty 1

## 2024-08-19 MED ORDER — TRACE MINERALS CU-MN-SE-ZN 300-55-60-3000 MCG/ML IV SOLN
INTRAVENOUS | Status: AC
  Filled 2024-08-19: qty 672

## 2024-08-19 MED ORDER — FAMOTIDINE 20 MG PO TABS
20.0000 mg | ORAL_TABLET | Freq: Every day | ORAL | Status: DC
  Administered 2024-08-19 – 2024-08-20 (×2): 20 mg via ORAL
  Filled 2024-08-19 (×2): qty 1

## 2024-08-19 MED ORDER — NIFEDIPINE ER OSMOTIC RELEASE 60 MG PO TB24
60.0000 mg | ORAL_TABLET | Freq: Every day | ORAL | Status: DC
  Administered 2024-08-19 – 2024-08-20 (×2): 60 mg via ORAL
  Filled 2024-08-19 (×3): qty 1

## 2024-08-19 MED ORDER — HYDRALAZINE HCL 25 MG PO TABS
25.0000 mg | ORAL_TABLET | Freq: Three times a day (TID) | ORAL | Status: DC
  Administered 2024-08-19 – 2024-08-21 (×6): 25 mg via ORAL
  Filled 2024-08-19 (×6): qty 1

## 2024-08-19 MED ORDER — POTASSIUM CHLORIDE CRYS ER 20 MEQ PO TBCR
40.0000 meq | EXTENDED_RELEASE_TABLET | Freq: Once | ORAL | Status: AC
  Administered 2024-08-19: 40 meq via ORAL
  Filled 2024-08-19: qty 2

## 2024-08-19 MED ORDER — MAGNESIUM SULFATE 4 GM/100ML IV SOLN
4.0000 g | Freq: Once | INTRAVENOUS | Status: AC
  Administered 2024-08-19: 4 g via INTRAVENOUS
  Filled 2024-08-19: qty 100

## 2024-08-19 MED ORDER — ACETAMINOPHEN 500 MG PO TABS
1000.0000 mg | ORAL_TABLET | Freq: Four times a day (QID) | ORAL | Status: DC
  Administered 2024-08-19 – 2024-08-21 (×8): 1000 mg via ORAL
  Filled 2024-08-19 (×8): qty 2

## 2024-08-19 MED ORDER — INSULIN ASPART 100 UNIT/ML IJ SOLN
0.0000 [IU] | INTRAMUSCULAR | Status: AC
  Administered 2024-08-19: 4 [IU] via SUBCUTANEOUS
  Administered 2024-08-19 – 2024-08-20 (×3): 7 [IU] via SUBCUTANEOUS
  Administered 2024-08-20: 4 [IU] via SUBCUTANEOUS
  Administered 2024-08-20: 3 [IU] via SUBCUTANEOUS
  Administered 2024-08-20: 7 [IU] via SUBCUTANEOUS
  Administered 2024-08-20 – 2024-08-21 (×4): 4 [IU] via SUBCUTANEOUS
  Administered 2024-08-21: 7 [IU] via SUBCUTANEOUS
  Administered 2024-08-21: 4 [IU] via SUBCUTANEOUS
  Administered 2024-08-21: 3 [IU] via SUBCUTANEOUS
  Administered 2024-08-21: 11 [IU] via SUBCUTANEOUS
  Administered 2024-08-21 – 2024-08-22 (×4): 3 [IU] via SUBCUTANEOUS
  Administered 2024-08-22: 4 [IU] via SUBCUTANEOUS
  Administered 2024-08-22: 3 [IU] via SUBCUTANEOUS
  Administered 2024-08-22: 4 [IU] via SUBCUTANEOUS
  Administered 2024-08-23 (×2): 3 [IU] via SUBCUTANEOUS
  Administered 2024-08-23 (×2): 4 [IU] via SUBCUTANEOUS
  Administered 2024-08-24 (×2): 11 [IU] via SUBCUTANEOUS
  Administered 2024-08-24 (×3): 7 [IU] via SUBCUTANEOUS
  Administered 2024-08-24: 11 [IU] via SUBCUTANEOUS
  Administered 2024-08-25: 3 [IU] via SUBCUTANEOUS
  Administered 2024-08-25 (×2): 4 [IU] via SUBCUTANEOUS
  Administered 2024-08-25: 7 [IU] via SUBCUTANEOUS
  Administered 2024-08-25: 4 [IU] via SUBCUTANEOUS
  Administered 2024-08-26 – 2024-08-27 (×3): 3 [IU] via SUBCUTANEOUS
  Administered 2024-08-27: 4 [IU] via SUBCUTANEOUS
  Administered 2024-08-27 – 2024-08-28 (×3): 3 [IU] via SUBCUTANEOUS
  Administered 2024-08-28: 4 [IU] via SUBCUTANEOUS
  Administered 2024-08-28: 3 [IU] via SUBCUTANEOUS
  Administered 2024-08-28 – 2024-08-29 (×6): 4 [IU] via SUBCUTANEOUS
  Administered 2024-08-29: 3 [IU] via SUBCUTANEOUS
  Administered 2024-08-29: 4 [IU] via SUBCUTANEOUS
  Administered 2024-08-30 – 2024-09-01 (×11): 3 [IU] via SUBCUTANEOUS
  Administered 2024-09-01: 7 [IU] via SUBCUTANEOUS
  Administered 2024-09-01 – 2024-09-02 (×3): 3 [IU] via SUBCUTANEOUS
  Administered 2024-09-02: 4 [IU] via SUBCUTANEOUS
  Administered 2024-09-02 – 2024-09-03 (×4): 3 [IU] via SUBCUTANEOUS
  Filled 2024-08-19 (×4): qty 3
  Filled 2024-08-19: qty 7
  Filled 2024-08-19: qty 4
  Filled 2024-08-19: qty 11
  Filled 2024-08-19: qty 4
  Filled 2024-08-19 (×3): qty 3
  Filled 2024-08-19: qty 4
  Filled 2024-08-19 (×2): qty 3
  Filled 2024-08-19: qty 4
  Filled 2024-08-19: qty 7
  Filled 2024-08-19: qty 11
  Filled 2024-08-19 (×3): qty 3
  Filled 2024-08-19 (×2): qty 7
  Filled 2024-08-19 (×2): qty 3
  Filled 2024-08-19: qty 11
  Filled 2024-08-19: qty 4
  Filled 2024-08-19: qty 7
  Filled 2024-08-19: qty 4
  Filled 2024-08-19 (×3): qty 3
  Filled 2024-08-19: qty 4
  Filled 2024-08-19: qty 3
  Filled 2024-08-19: qty 4
  Filled 2024-08-19 (×2): qty 3
  Filled 2024-08-19: qty 7
  Filled 2024-08-19: qty 4
  Filled 2024-08-19: qty 11
  Filled 2024-08-19 (×2): qty 3
  Filled 2024-08-19 (×3): qty 4
  Filled 2024-08-19 (×3): qty 3
  Filled 2024-08-19 (×3): qty 4
  Filled 2024-08-19 (×2): qty 3
  Filled 2024-08-19: qty 7
  Filled 2024-08-19: qty 3
  Filled 2024-08-19: qty 7
  Filled 2024-08-19 (×5): qty 4
  Filled 2024-08-19: qty 3
  Filled 2024-08-19: qty 1
  Filled 2024-08-19: qty 3
  Filled 2024-08-19: qty 4
  Filled 2024-08-19 (×2): qty 3
  Filled 2024-08-19: qty 7
  Filled 2024-08-19: qty 3
  Filled 2024-08-19: qty 4
  Filled 2024-08-19: qty 7
  Filled 2024-08-19: qty 3
  Filled 2024-08-19: qty 4
  Filled 2024-08-19 (×3): qty 3

## 2024-08-19 MED ORDER — POTASSIUM CHLORIDE 20 MEQ PO PACK: 40.0000 meq | PACK | Freq: Once | ORAL | Status: DC

## 2024-08-19 NOTE — Progress Notes (Signed)
 Subjective: She reports feeling okay this AM.  Objective: Vital signs in last 24 hours: Temp:  [98.5 F (36.9 C)-100.4 F (38 C)] 98.5 F (36.9 C) (01/22 1120) Pulse Rate:  [76-123] 80 (01/22 1200) Resp:  [13-28] 19 (01/22 1200) BP: (105-167)/(63-84) 152/68 (01/22 1200) SpO2:  [94 %-100 %] 97 % (01/22 1200) FiO2 (%):  [36 %] 36 % (01/22 0810) Weight:  [65.9 kg] 65.9 kg (01/22 0700) Last BM Date : 08/18/24  Intake/Output from previous day: 01/21 0701 - 01/22 0700 In: 3353.9 [P.O.:1320; I.V.:1630; IV Piggyback:403.9] Out: 4825 [Urine:4775; Chest Tube:50] Intake/Output this shift: Total I/O In: 1147.3 [P.O.:720; I.V.:300; IV Piggyback:127.3] Out: 750 [Urine:750]  General appearance: alert, fatigued, and no distress GI: soft, non-tender; bowel sounds normal; no masses,  no organomegaly  Lab Results: Recent Labs    08/18/24 0742 08/19/24 0536 08/19/24 0732  WBC 16.9* 13.3* 16.0*  HGB 8.1* 7.5* 8.5*  HCT 24.7* 22.5* 24.7*  PLT 158 129* 168   BMET Recent Labs    08/18/24 0742 08/18/24 1451 08/19/24 0654  NA 141 140 138  K 3.6 4.0 3.7  CL 106 104 101  CO2 24 23 25   GLUCOSE 159* 152* 158*  BUN 62* 66* 66*  CREATININE 2.58* 2.70* 2.66*  CALCIUM 7.9* 7.8* 8.3*   LFT Recent Labs    08/19/24 0654  PROT 5.6*  ALBUMIN  2.8*  AST 37  ALT 24  ALKPHOS 47  BILITOT 0.4   PT/INR Recent Labs    08/18/24 0742  LABPROT 13.7  INR 1.0   Hepatitis Panel No results for input(s): HEPBSAG, HCVAB, HEPAIGM, HEPBIGM in the last 72 hours. C-Diff No results for input(s): CDIFFTOX in the last 72 hours. Fecal Lactopherrin No results for input(s): FECLLACTOFRN in the last 72 hours.  Studies/Results: DG Chest Port 1 View Result Date: 08/19/2024 CLINICAL DATA:  Status post lobectomy. EXAM: PORTABLE CHEST 1 VIEW COMPARISON:  08/18/2024 FINDINGS: Low volume film. Cardiopericardial silhouette is at upper limits of normal for size. Asymmetric bilateral relatively  diffuse airspace disease is stable in the interval. Small right pleural effusion again noted. Esophageal stent device again noted. Right PICC line tip overlies the proximal to mid SVC level. No pneumothorax. Telemetry leads overlie the chest. IMPRESSION: No substantial interval change. Asymmetric bilateral airspace disease. Electronically Signed   By: Camellia Candle M.D.   On: 08/19/2024 06:36   DG Chest Port 1 View Result Date: 08/18/2024 EXAM: 1 VIEW XRAY OF THE CHEST 08/18/2024 05:23:00 AM COMPARISON: 08/17/2024 05:22:00 AM CLINICAL HISTORY: [id number] S/P lobectomy of lung [id number] FINDINGS: LINES, TUBES AND DEVICES: The right PICC terminates at the superior cavoatrial junction. There are overlying monitor leads. LUNGS AND PLEURA: Patchy bilateral airspace disease is again noted with small pleural effusions. No new or worsening lung findings. Overall aeration seems unchanged. HEART AND MEDIASTINUM: The cardiac size is normal. Stable mediastinal with calcification of the transverse aorta and upper to mid esophageal stenting. BONES AND SOFT TISSUES: No acute osseous abnormality. IMPRESSION: 1. Patchy bilateral airspace disease with small pleural effusions, stable compared to the prior study. 2. Right PICC terminates at the superior cavoatrial junction. Electronically signed by: Francis Quam MD 08/18/2024 05:44 AM EST RP Workstation: [id number]    Medications: Scheduled:  acetaminophen   1,000 mg Oral Q6H   Chlorhexidine  Gluconate Cloth  6 each Topical Daily   famotidine   20 mg Oral Daily   feeding supplement (GLUCERNA SHAKE)  237 mL Oral TID BM   furosemide   60 mg Intravenous BID  heparin  injection (subcutaneous)  5,000 Units Subcutaneous Q8H   hydrALAZINE   25 mg Oral Q8H   HYDROmorphone   4 mg Oral Q6H   insulin  aspart  0-20 Units Subcutaneous Q4H   insulin  glargine  15 Units Subcutaneous BID   ipratropium-albuterol   3 mL Nebulization Q6H   metoprolol  tartrate  50 mg Oral BID    neomycin -bacitracin -polymyxin   Topical BID   NIFEdipine   60 mg Oral Daily   nitroGLYCERIN   0.3 mg Transdermal Daily   ondansetron  (ZOFRAN ) IV  4 mg Intravenous Q6H   sodium chloride  flush  10-40 mL Intracatheter Q12H   Continuous:  fluconazole  (DIFLUCAN ) IV 400 mg (08/19/24 1113)   piperacillin -tazobactam (ZOSYN )  IV Stopped (08/19/24 9047)   promethazine  (PHENERGAN ) injection (IM or IVPB) Stopped (08/18/24 1207)   TPN ADULT (ION)      Assessment/Plan: 1) S/p esophageal leak. 2) Low grade fever at 100.4 F. 3) Anemia - mild decline without overt evidence of bleeding.   The patient remains stable.  There was a low grade fever.  CXR remains unchanged.  There was a mild decline in her HGB, but no evidence of bleeding.  Plan: 1) Continue with antibiotics. 2) Advance diet as tolerated. 3) Repeat esophageal to check for leak of there is an increase in the chest tube.  LOS: 24 days   Kofi Murrell D 08/19/2024, 12:25 PM

## 2024-08-19 NOTE — Progress Notes (Signed)
 EVENING ROUNDS NOTE :     [address] [place] [employer] [address]             [phone]               3 Days Post-Op Procedures (LRB): EGD (ESOPHAGOGASTRODUODENOSCOPY) (N/A) CONTROL OF HEMORRHAGE, GI TRACT, ENDOSCOPIC, BY CLIPPING OR OVERSEWING   Total Length of Stay:  LOS: 24 days  Events:   No events CXR reviewed.  Stable     BP (!) 123/55   Pulse (!) 104   Temp 99.8 F (37.7 C) (Axillary)   Resp 20   Ht 5' 3 (1.6 m)   Wt 65.9 kg   LMP 10/26/1998 Comment: ~IN 40s  SpO2 99%   BMI 25.74 kg/m      FiO2 (%):  [36 %] 36 %   fluconazole  (DIFLUCAN ) IV 400 mg (08/19/24 1113)   piperacillin -tazobactam (ZOSYN )  IV 12.5 mL/hr at 08/19/24 1700   promethazine  (PHENERGAN ) injection (IM or IVPB) Stopped (08/19/24 1554)   TPN ADULT (ION)      I/O last 3 completed shifts: In: 5103.5 [P.O.:1660; I.V.:2947.3; IV Piggyback:496.2] Out: 6055 [Urine:5975; Chest Tube:80]      Latest Ref Rng & Units 08/19/2024    7:32 AM 08/19/2024    5:36 AM 08/18/2024    7:42 AM  CBC  WBC 4.0 - 10.5 K/uL 16.0  13.3  16.9   Hemoglobin 12.0 - 15.0 g/dL 8.5  7.5  8.1   Hematocrit 36.0 - 46.0 % 24.7  22.5  24.7   Platelets 150 - 400 K/uL 168  129  158        Latest Ref Rng & Units 08/19/2024    6:54 AM 08/18/2024    2:51 PM 08/18/2024    7:42 AM  BMP  Glucose 70 - 99 mg/dL 841  847  840   BUN 8 - 23 mg/dL 66  66  62   Creatinine 0.44 - 1.00 mg/dL 7.33  7.29  7.41   Sodium 135 - 145 mmol/L 138  140  141   Potassium 3.5 - 5.1 mmol/L 3.7  4.0  3.6   Chloride 98 - 111 mmol/L 101  104  106   CO2 22 - 32 mmol/L 25  23  24    Calcium 8.9 - 10.3 mg/dL 8.3  7.8  7.9     ABG    Component Value Date/Time   TCO2 25 11/10/2009 0952       Kelly Rayas, MD 08/19/2024 5:30 PM

## 2024-08-19 NOTE — Progress Notes (Addendum)
 Patient ID: [name], female   DOB: May 18, 1956, 69 y.o.   MRN: [id number] [facility] Progress Note   Assessment/ Plan:   1. Acute kidney Injury: Suspected to have ATN secondary to ischemic and nephrotoxic etiologies.  Serologies negative for GN including vasculitis/anti-GBM given previous concerns of gross hematuria.  Renal function essentially unchanged overnight with continued maintenance of decent urine output again pointing to the possibility that she is at the plateau phase of ATN.  No changes to management at this time and will continue to follow labs/urine output. 2.  Esophageal leak following resection of esophageal diverticulum: Underwent placement of a new esophageal stent previous one migrated distally into the stomach.  EGD and esophagram done yesterday showing no leak with plans for possible advancement of diet. 3.  Hypertensive urgency: Blood pressures improved following transition to oral/transdermal antihypertensive therapy. 4. Anemia: related to previous losses including hematuria.  Hemoglobin/hematocrit essentially stable overnight and without indication for PRBC transfusion or additional interventions.  Subjective:   Reports to be feeling uncomfortable sitting up in recliner because of chronic neck pain.   Objective:   BP 105/84 (BP Location: Left Arm)   Pulse 91   Temp 98.7 F (37.1 C) (Oral)   Resp (!) 21   Ht 5' 3 (1.6 m)   Wt 65.9 kg   LMP 10/26/1998 Comment: ~IN 40s  SpO2 94%   BMI 25.74 kg/m   Intake/Output Summary (Last 24 hours) at 08/19/2024 0758 Last data filed at 08/19/2024 0700 Gross per 24 hour  Intake 3353.9 ml  Output 4825 ml  Net -1471.1 ml   Weight change: -8 kg  Physical Exam: Gen: Sitting up in recliner, nurse at bedside providing care CVS: Pulse regular rhythm, normal rate, S1 and S2 normal Resp: Diminished breath sounds with poor inspiratory effort over bases.  Chest tube in situ Abd: Soft, nontender, bowel sounds  normal Ext: No lower extremity edema  Imaging: DG Chest Port 1 View Result Date: 08/19/2024 CLINICAL DATA:  Status post lobectomy. EXAM: PORTABLE CHEST 1 VIEW COMPARISON:  08/18/2024 FINDINGS: Low volume film. Cardiopericardial silhouette is at upper limits of normal for size. Asymmetric bilateral relatively diffuse airspace disease is stable in the interval. Small right pleural effusion again noted. Esophageal stent device again noted. Right PICC line tip overlies the proximal to mid SVC level. No pneumothorax. Telemetry leads overlie the chest. IMPRESSION: No substantial interval change. Asymmetric bilateral airspace disease. Electronically Signed   By: Camellia Candle M.D.   On: 08/19/2024 06:36   DG Chest Port 1 View Result Date: 08/18/2024 EXAM: 1 VIEW XRAY OF THE CHEST 08/18/2024 05:23:00 AM COMPARISON: 08/17/2024 05:22:00 AM CLINICAL HISTORY: [id number] S/P lobectomy of lung [id number] FINDINGS: LINES, TUBES AND DEVICES: The right PICC terminates at the superior cavoatrial junction. There are overlying monitor leads. LUNGS AND PLEURA: Patchy bilateral airspace disease is again noted with small pleural effusions. No new or worsening lung findings. Overall aeration seems unchanged. HEART AND MEDIASTINUM: The cardiac size is normal. Stable mediastinal with calcification of the transverse aorta and upper to mid esophageal stenting. BONES AND SOFT TISSUES: No acute osseous abnormality. IMPRESSION: 1. Patchy bilateral airspace disease with small pleural effusions, stable compared to the prior study. 2. Right PICC terminates at the superior cavoatrial junction. Electronically signed by: Francis Quam MD 08/18/2024 05:44 AM EST RP Workstation: [id number]   DG ESOPHAGUS W SINGLE CM (SOL OR THIN BA) Result Date: 08/17/2024 CLINICAL DATA:  69 year old female with history of a right middle lobe  lobectomy, and midthoracic esophageal diverticular resection on 07/26/24 complicated by persistent esophageal perforation.  Patient is status post esophageal stent placement on 08/04/24 which migrated into the stomach. A new stent was placed on 08/13/24, with old migrated stent removed at that time. Subsequent esophagram on 08/15/23 demonstrated persistent leak from the right distal end of esophageal stent. IR is requested for repeat esophagram to check for persistent leak. EXAM: ESOPHAGUS/BARIUM SWALLOW/TABLET STUDY TECHNIQUE: Single contrast examination was performed using thin liquid barium. This exam was performed by Carlin Griffon, PA-C, and was supervised and interpreted by Dr. Ree Molt, MD. FLUOROSCOPY: Radiation Exposure Index (as provided by the fluoroscopic device): 20.60 mGy Kerma COMPARISON:  DG esophagus 08/14/24; CT Chest Abdomen Pelvis w/o cm on 08/12/24. FINDINGS: Swallowing: Appears normal. No vestibular penetration or aspiration seen. Pharynx: Unremarkable. Esophagus: Normal appearance. Stent in place. No Peri stent leak noted. Esophageal motility: Mild dysmotility with stent in place. Hiatal Hernia: None noted. Gastroesophageal reflux: Not evaluated. Ingested 13mm barium tablet: Not given Other: Esophageal stent in place, as seen on prior imaging. IMPRESSION: *No leak noted. Electronically Signed   By: Ree Molt M.D.   On: 08/17/2024 12:23    Labs: BMET Recent Labs  Lab 08/15/24 0050 08/15/24 1800 08/16/24 0500 08/17/24 0958 08/18/24 0742 08/18/24 1451 08/19/24 0654  NA 142 144 142 143 141 140 138  K 3.6 3.3* 3.5 3.4* 3.6 4.0 3.7  CL 111 109 107 108 106 104 101  CO2 21* 22 25 23 24 23 25   GLUCOSE 246* 135* 122* 226* 159* 152* 158*  BUN 50* 50* 46* 58* 62* 66* 66*  CREATININE 2.55* 2.39* 2.55* 2.72* 2.58* 2.70* 2.66*  CALCIUM 7.6* 8.1* 8.2* 8.1* 7.9* 7.8* 8.3*  PHOS 3.3  --  2.2* 2.1* 2.7  --  3.1   CBC Recent Labs  Lab 08/14/24 1325 08/14/24 1923 08/18/24 0207 08/18/24 0742 08/19/24 0536 08/19/24 0732  WBC 10.8*   < > 16.8* 16.9* 13.3* 16.0*  NEUTROABS 8.4*  --   --   --   --    --   HGB 9.5*   < > 8.8* 8.1* 7.5* 8.5*  HCT 27.9*   < > 25.7* 24.7* 22.5* 24.7*  MCV 93.3   < > 92.4 93.6 97.8 91.1  PLT 198   < > 165 158 129* 168   < > = values in this interval not displayed.    Medications:     amLODipine   5 mg Oral Daily   Chlorhexidine  Gluconate Cloth  6 each Topical Daily   cloNIDine   0.3 mg Transdermal Weekly   feeding supplement (GLUCERNA SHAKE)  237 mL Oral TID BM   fentaNYL   1 patch Transdermal Q72H   furosemide   60 mg Intravenous BID   heparin  injection (subcutaneous)  5,000 Units Subcutaneous Q8H   hydrALAZINE   10 mg Intravenous Q6H   insulin  aspart  0-15 Units Subcutaneous Q4H   insulin  glargine  15 Units Subcutaneous BID   ipratropium-albuterol   3 mL Nebulization Q6H   metoprolol  tartrate  50 mg Oral BID   neomycin -bacitracin -polymyxin   Topical BID   nitroGLYCERIN   0.3 mg Transdermal Daily   sodium chloride  flush  10-40 mL Intracatheter Q12H    Gordy Blanch, MD 08/19/2024, 7:58 AM

## 2024-08-19 NOTE — Plan of Care (Signed)
" °  Problem: Bowel/Gastric: Goal: Gastrointestinal status for postoperative course will improve Outcome: Progressing   Problem: Cardiac: Goal: Ability to maintain an adequate cardiac output Outcome: Progressing   Problem: Clinical Measurements: Goal: Ability to maintain clinical measurements within normal limits Outcome: Progressing   Problem: Respiratory: Goal: Will regain and/or maintain adequate ventilation Outcome: Progressing   Problem: Respiratory: Goal: Respiratory status will improve Outcome: Progressing   Problem: Skin Integrity: Goal: Demonstrates signs of wound healing without infection Outcome: Progressing   Problem: Clinical Measurements: Goal: Ability to maintain clinical measurements within normal limits will improve Outcome: Progressing   Problem: Nutrition: Goal: Adequate nutrition will be maintained Outcome: Progressing   "

## 2024-08-19 NOTE — Progress Notes (Addendum)
 TCTS DAILY ICU PROGRESS NOTE                   [address]            [place] [address]          [phone]   3 Days Post-Op Procedures (LRB): EGD (ESOPHAGOGASTRODUODENOSCOPY) (N/A) CONTROL OF HEMORRHAGE, GI TRACT, ENDOSCOPIC, BY CLIPPING OR OVERSEWING 6 days s/p bronchoscopy         13 days s/p EGD, reposition esophageal stent 15 days s/p EGD, placement of esophageal stent 24 days s/p Xi RML, LN dissection, intercostal nerve block, resection of esophageal diverticulum  Total Length of Stay:  LOS: 24 days   Subjective: She just used the bathroom and is sitting in a chair. She has some nausea (no abdominal pain or vomiting) this am and just feels not well  Objective: Vital signs in last 24 hours: Temp:  [97.9 F (36.6 C)-100.4 F (38 C)] 99.7 F (37.6 C) (01/21 2327) Pulse Rate:  [92-126] 96 (01/22 0415) Cardiac Rhythm: Normal sinus rhythm (01/21 2100) Resp:  [14-23] 15 (01/22 0415) BP: (134-167)/(63-77) 163/77 (01/22 0427) SpO2:  [92 %-100 %] 97 % (01/22 0415)  Filed Weights   08/16/24 0518 08/17/24 0500 08/18/24 0630  Weight: 72.2 kg 71.9 kg 73.9 kg    Weight change:       Intake/Output from previous day: 01/21 0701 - 01/22 0700 In: 3103.3 [P.O.:1320; I.V.:1394.7; IV Piggyback:388.7] Out: 3965 [Urine:3925; Chest Tube:40]  Intake/Output this shift: Total I/O In: 1259.9 [P.O.:720; I.V.:489.9; IV Piggyback:50] Out: 2125 [Urine:2125]  Current Meds: Scheduled Meds:  amLODipine   5 mg Oral Daily   Chlorhexidine  Gluconate Cloth  6 each Topical Daily   cloNIDine   0.3 mg Transdermal Weekly   feeding supplement (GLUCERNA SHAKE)  237 mL Oral TID BM   fentaNYL   1 patch Transdermal Q72H   furosemide   60 mg Intravenous BID   heparin  injection (subcutaneous)  5,000 Units Subcutaneous Q8H   hydrALAZINE   10 mg Intravenous Q6H   insulin  aspart  0-15 Units Subcutaneous Q4H   insulin  glargine  15 Units Subcutaneous BID   ipratropium-albuterol   3 mL  Nebulization Q6H   metoprolol  tartrate  50 mg Oral BID   neomycin -bacitracin -polymyxin   Topical BID   nitroGLYCERIN   0.3 mg Transdermal Daily   sodium chloride  flush  10-40 mL Intracatheter Q12H   Continuous Infusions:  fluconazole  (DIFLUCAN ) IV Stopped (08/18/24 1142)   niCARDipine  Stopped (08/18/24 1153)   piperacillin -tazobactam (ZOSYN )  IV 3.375 g (08/19/24 0541)   promethazine  (PHENERGAN ) injection (IM or IVPB) Stopped (08/18/24 1207)   TPN ADULT (ION) 60 mL/hr at 08/19/24 0300   PRN Meds:.bisacodyl , diphenhydrAMINE , HYDROmorphone  (DILAUDID ) injection, levalbuterol , lidocaine -prilocaine , lip balm, LORazepam , ondansetron  (ZOFRAN ) IV, mouth rinse, phenol, promethazine  (PHENERGAN ) injection (IM or IVPB), sodium chloride  flush  General appearance: alert, cooperative, and no distress Neurologic: intact Heart: Tachycardic this am Lungs: Slightly diminished right basilar breath sounds Abdomen: Soft, non tender, sporadic bowel sounds Wounds: Clean and dry. Dressing dry and intact around right chest tube. Chest tube: to water seal,light colored output  Lab Results: CBC: Recent Labs    08/18/24 0742 08/19/24 0536  WBC 16.9* 13.3*  HGB 8.1* 7.5*  HCT 24.7* 22.5*  PLT 158 129*   BMET:  Recent Labs    08/18/24 0742 08/18/24 1451  NA 141 140  K 3.6 4.0  CL 106 104  CO2 24 23  GLUCOSE 159* 152*  BUN 62* 66*  CREATININE 2.58* 2.70*  CALCIUM 7.9* 7.8*    CMET: Lab Results  Component Value Date   WBC 13.3 (H) 08/19/2024   HGB 7.5 (L) 08/19/2024   HCT 22.5 (L) 08/19/2024   PLT 129 (L) 08/19/2024   GLUCOSE 152 (H) 08/18/2024   TRIG 211 (H) 08/16/2024   ALT 16 08/16/2024   AST 25 08/16/2024   NA 140 08/18/2024   K 4.0 08/18/2024   CL 104 08/18/2024   CREATININE 2.70 (H) 08/18/2024   BUN 66 (H) 08/18/2024   CO2 23 08/18/2024   TSH 3.532 02/28/2022   INR 1.0 08/18/2024   HGBA1C 5.9 (H) 07/20/2024      PT/INR:  Recent Labs    08/18/24 0742  LABPROT 13.7  INR  1.0   Radiology: [facility] Chest Port 1 View Result Date: 08/19/2024 CLINICAL DATA:  Status post lobectomy. EXAM: PORTABLE CHEST 1 VIEW COMPARISON:  08/18/2024 FINDINGS: Low volume film. Cardiopericardial silhouette is at upper limits of normal for size. Asymmetric bilateral relatively diffuse airspace disease is stable in the interval. Small right pleural effusion again noted. Esophageal stent device again noted. Right PICC line tip overlies the proximal to mid SVC level. No pneumothorax. Telemetry leads overlie the chest. IMPRESSION: No substantial interval change. Asymmetric bilateral airspace disease. Electronically Signed   By: Camellia Candle M.D.   On: 08/19/2024 06:36     Assessment/Plan: S/P Procedures (LRB): EGD (ESOPHAGOGASTRODUODENOSCOPY) (N/A) CONTROL OF HEMORRHAGE, GI TRACT, ENDOSCOPIC, BY CLIPPING OR OVERSEWING 1. CV - ST this am. Hypertensive this am. On Clonidine  patch 0.3 mg, Amlodipine  5 mg daily, Hydralazine  10 mg IV QID, Lopressor  suspension 50 mg bid,  and Nitroglycerin  patch 0.3 mg.  2.  Pulmonary - On 4L of oxygen via [place]. Chest tube is to water seal, light yellowish drainage. Chest tube with 40 cc last 24 hours. CXR this am appears stable (esophageal stent in proper position, small right pleural effusion). Encourage incentive spirometer 4. CBGs 162/168/131. On TPN so continue accu checks. 5. Anemia-H and H this am decreased to 7.5 and 22.5;repeat ordered 6. ID-on Zosyn  and Diflucan  for esophageal leak, aspiration PNA. WBC this am decreased to 13,300. Respiratory gram stain shows moderate gram negative rods. Respiratory culture shows few normal respiratory flora-no Staph aureus or Pseudomonas seen. Blood culture shows no growth. Bronchial alveolar lavage shows culture shows no Staph or Pseudomonas and no anaerobes. 7. GI-on full liquid diet, TPN nightly 8. History of tobacco abuse-continue Nicotine  patch 9. AKI, gross hematuria-creatinine this am slightly increased to 2.7. On Lasix  60  mg IV bid. Per nephrology, no need for HD yet. Await this am's result 10. On Lovenox  for DVT prophylaxis    Donielle CHRISTELLA Donald PA-C 08/19/2024 6:54 AM  Patient seen and examined, agree with findings and plan outlined above Tolerating po, on glucerna supplements Continue TNA another 24 hours and monitor PO intake Primary complaint is back pain, can't use NSAID with AKI, will add acetaminophen   Elspeth C. Kerrin, MD [facility] [phone]

## 2024-08-19 NOTE — Progress Notes (Signed)
 cxr

## 2024-08-19 NOTE — Progress Notes (Signed)
 "  NAME:  [name], MRN:  [id number], DOB:  [birth year], LOS: 24 ADMISSION DATE:  07/26/2024, CONSULTATION DATE:  1/15 REFERRING MD:  [name], CHIEF COMPLAINT:  sepsis   History of Present Illness:  Ms. [name] is a 69 y/o woman with a history of tobacco abuse and lung adenocarcinoma status post RML lobectomy on 12/29.  At the time of surgery was a large cyst that was resected. Despite oversewing it time resection she later developed an esophageal leak.  She required an esophageal stent which she has had recurrent problems with related to positioning/migration of the stent (necessitating multiple EGDs for stent revision). From 1/15 through 1/18, the patient was consistently total body balance positive in setting of receiving NS @ 100 mL/hr and then starting TPN as of 1/18. Due to concomitant AKI, developed volume overload and pulmonary edema and hypertensive urgency ultimately requiring a Cardene  drip be started overnight 1/19. Also on 1/19, she underwent revision of the position of her esophageal stent to address a small esophageal leak.  Pertinent  Medical History  Lung adenocarcinoma Latent  TB, treated in 1980 HTN HLD DM OSA Tobacco abuse  Significant Hospital Events: Including procedures, antibiotic start and stop dates in addition to other pertinent events   12/29 RML lobectomy 1/7 EGD with esophageal stent 1/9 EGD  with stent reposition after migration to stomach 1/15 vanc added, zosyn  restarted. CT  1/19: EGD for stent repositioning due to ongoing small esophageal leak / stent migration 1/20: Gastrograffin study -> no esophageal leak. Started on clear liquid diet. 1/21: Full liquid diet. Diuresed.  Interim History / Subjective:  Advanced to soft diet with plan to advance to regular diet as tolerated. No issue with whole pills either per GI team.  Antihypertensive regimen transitioned from clonidine  patch and IV hydralazine  to PO nifedipine  + hydralazine  +  amlodipine .  Objective    Blood pressure (!) 123/55, pulse (!) 104, temperature 99.8 F (37.7 C), temperature source Axillary, resp. rate 20, height 5' 3 (1.6 m), weight 65.9 kg, last menstrual period 10/26/1998, SpO2 99%.    FiO2 (%):  [36 %] 36 %   Intake/Output Summary (Last 24 hours) at 08/19/2024 1730 Last data filed at 08/19/2024 1700 Gross per 24 hour  Intake 3481.46 ml  Output 5015 ml  Net -1533.54 ml   Filed Weights   08/17/24 0500 08/18/24 0630 08/19/24 0700  Weight: 71.9 kg 73.9 kg 65.9 kg    Examination: General: chronically ill appearing woman lying in bed in NAD, A&Ox3, on 4L [place] HENT: [place]/AT, eyes anicteric Lungs: Breathing comfortably, no rhonchi Cardiovascular: S1S2, RRR  Abdomen: soft, NT Extremities: no peripheral edema Neuro: awake, alert, moving all extremities   I/O balance: 3353/4825, net neg 1.5 L  Resolved problem list   Assessment and Plan   Esophageal leak - S/p esophageal stenting c/b migration, now s/p repositioning by GI most recently on 1/19 - R chest tube per TCTS - Con't TPN as not yet able to take sufficient PO to sustain caloric needs  AKI c/b ATN and hypervolemia Hypertensive urgency - Net negative y/day with improvement in hypertensive urgency accordingly (off nicardipine  drip). Continue Lasix  IV BID for volume management. Replete electrolytes as needed. - Await renal recovery from ATN.  Lung adenocarcinoma, s/p lobectomy -post op care per TCTS -switch fentanyl  patch to dilaudid  PO Q6H now that we have enteral access -con't Dilaudid  IV PRN for breakthrough pain  H/o tobacco abuse -recommend cessation -con't nicotine  patch  Hyperglycemia -SSI + glargine -  goal BG 140-180   Labs   CBC: Recent Labs  Lab 08/14/24 1325 08/14/24 1923 08/17/24 0515 08/18/24 0207 08/18/24 0742 08/19/24 0536 08/19/24 0732  WBC 10.8*   < > 7.0 16.8* 16.9* 13.3* 16.0*  NEUTROABS 8.4*  --   --   --   --   --   --   HGB 9.5*   < > 8.6* 8.8*  8.1* 7.5* 8.5*  HCT 27.9*   < > 25.5* 25.7* 24.7* 22.5* 24.7*  MCV 93.3   < > 95.5 92.4 93.6 97.8 91.1  PLT 198   < > 139* 165 158 129* 168   < > = values in this interval not displayed.    Basic Metabolic Panel: Recent Labs  Lab 08/15/24 0050 08/15/24 1800 08/16/24 0500 08/17/24 0958 08/18/24 0742 08/18/24 1451 08/19/24 0654  NA 142   < > 142 143 141 140 138  K 3.6   < > 3.5 3.4* 3.6 4.0 3.7  CL 111   < > 107 108 106 104 101  CO2 21*   < > 25 23 24 23 25   GLUCOSE 246*   < > 122* 226* 159* 152* 158*  BUN 50*   < > 46* 58* 62* 66* 66*  CREATININE 2.55*   < > 2.55* 2.72* 2.58* 2.70* 2.66*  CALCIUM 7.6*   < > 8.2* 8.1* 7.9* 7.8* 8.3*  MG 2.0  --  1.7 2.0  --   --  1.5*  PHOS 3.3  --  2.2* 2.1* 2.7  --  3.1   < > = values in this interval not displayed.   GFR: Estimated Creatinine Clearance: 19 mL/min (A) (by C-G formula based on SCr of 2.66 mg/dL (H)). Recent Labs  Lab 08/18/24 0207 08/18/24 0742 08/19/24 0536 08/19/24 0732  WBC 16.8* 16.9* 13.3* 16.0*     MDM Level: High  Kelly Dales, MD Pulmonary, Critical Care & Sleep Medicine [facility]  7a-7p: For contact information, see AMION. If no response to pager, please call PCCM 2-H APP. After 7p: Please call PCCM APP on-call for 2-H.   "

## 2024-08-19 NOTE — Progress Notes (Signed)
 PHARMACY - TOTAL PARENTERAL NUTRITION CONSULT NOTE  Indication: Esophageal leak  Patient Measurements: Height: 5' 3 (160 cm) Weight: 65.9 kg (145 lb 4.5 oz) IBW/kg (Calculated) : 52.4 TPN AdjBW (KG): 55.8 Body mass index is 25.74 kg/m.  Assessment:  45 YOF with NSCLC here for RML lobectomy, resection of esophageal diverticulum, and lymph node dissection on 12/29.  Found to have a small contained leak post-op.   Patient reports an intentional weight loss of 40 lbs over a couple of years when she was diagnosed with DM.  She typically drinks one [place] shake every morning for breakfast and eats one meal a day, which could be pizza, spaghetti, chicken, beef or fish along with vegetables and fruits.  Patient was eating normally until her planned surgery. RD concerned with patient being malnourished. Patient was on TPN 12/31 - 1/12 and was advanced to dysphagia 1 diet. However, she was NPO again 1/14-1/16. On 1/17 IR reported small amount of leak from distal esophageal stent. Patient developed AKI since TPN was held. Pharmacy reconsulted for TPN 1/17.  Nephrology following. Serologies negative. Suspect plateau phase of ATN. No RRT. Good urine output.   Glucose / Insulin : hx DM, A1c 5.9% - BG 131-281, 19u SSI/24h, lantus  15u BID per TCTS, moderate SSI Q4H - CBG better control after increase to Q4H yesterday  Electrolytes: Na 138, K 3.7 ( KCL PO given 1/21), CO2 285 (1:1 TPN), phos: 3.1, mag: 1.5, CoCa: 9.3  Renal: AKI SCr 2.66,(BL 0.8), BUN 66 Hepatic: 1/22- LFTs / tbili WNL, albumin  2.8, TG 211 1/19 Intake / Output; MIVF: lasix  60mg  IV BID added 1/21, UOP 4.775 L (2.7 ml/kg/hr), LBM 1/21, chest tube 40 mL.   Net IO Since Admission: 17,470.38 mL [08/19/24 0913]  GI Imaging:   12/30 esophagram: Positive for contrast extravasation and an esophageal leak in the mid esophagus 1/2 esophagram: Persistent contrast extravasation and esophageal leak, similar to prior 1/6 esophagram: Limited  contrast study due to patient aspiration and severe coughing spell. Study aborted. 1/12 esophagram: Patent esophageal stent. No evidence for extravasation or leak, Delayed esophageal emptying and gastroesophageal reflux leading to stasis of contrast in the esophagus. 1/15 CT: displaced esophageal stent in stomach, no bowel obstruction  1/17 esophagram: small volume esophageal leak at distal stent 1/20 esophagram: no evidence of any leak  GI Surgeries / Procedures:  12/30 right middle lobectomy, resection esophageal diverticulum, lymph node biopsy  1/7 EGD- stent placement  1/9 EGD stent repositioned d/t migration to stomach  1/16 EGD- stent placed at esophageal ulcer, removed gastric stent, normal duoenum  1/19 EGD - stent replaced / repositioned   Central access: PICC 07/28/24 TPN start date: 07/28/24> 1/12; 1/17 >>   Nutritional Goals:  Goal unconcentrated TPN rate 75 ml/hr to provide 101g AA and 1721 kCal Goal concentrated TPN rate 60 mL/hr to provide 101 g AA and 1743 kCal  RD Estimated Needs Total Energy Estimated Needs: 1700-1900 Total Protein Estimated Needs: 100-115g Total Fluid Estimated Needs: >1.7L/day  Current Nutrition:  TPN  1/08 CLD - passed swallow 1/09 CLD - patient feels can't swallow well > NPO 1/13 advanced to DYS1 1/14- NPO  1/21 - full liquid diet, Glucerna x1 on 1/21, milkshake per patient   Plan:  Continue concentrated TPN at goal 60 mL/hr at 1800 given ongoing diuresis, meeting 100% estimated needs. Follow oral intake for transition to oral diet and off of TPN. Continue TPN for today per TCTS.  Electrolytes in TPN: Na 75 mEq/L, K 35 mEq/L (=  ), Ca 3 mEq/L, increase Mg 7 mEq/L, and Phos 10 mmol/L. Cl:Ac 1:1  2g mag sulfate outside of TPN.  Add standard MVI and trace elements to TPN Increase resistent SSI to q4h and adjust as needed. Suspect elevations are due to enteral diet versus TPN. Overall reasonable control in the past 24 hours.  Monitor TPN  labs on Mon/Thurs, and PRN  Thank you for allowing pharmacy to be a part of this patients care.  Powell Blush, PharmD, BCCCP  Clinical Pharmacist

## 2024-08-20 LAB — BASIC METABOLIC PANEL WITH GFR
Anion gap: 15 (ref 5–15)
BUN: 78 mg/dL — ABNORMAL HIGH (ref 8–23)
CO2: 23 mmol/L (ref 22–32)
Calcium: 7.9 mg/dL — ABNORMAL LOW (ref 8.9–10.3)
Chloride: 97 mmol/L — ABNORMAL LOW (ref 98–111)
Creatinine, Ser: 2.98 mg/dL — ABNORMAL HIGH (ref 0.44–1.00)
GFR, Estimated: 17 mL/min — ABNORMAL LOW
Glucose, Bld: 152 mg/dL — ABNORMAL HIGH (ref 70–99)
Potassium: 3.8 mmol/L (ref 3.5–5.1)
Sodium: 135 mmol/L (ref 135–145)

## 2024-08-20 LAB — CBC
HCT: 24 % — ABNORMAL LOW (ref 36.0–46.0)
Hemoglobin: 8.1 g/dL — ABNORMAL LOW (ref 12.0–15.0)
MCH: 30.7 pg (ref 26.0–34.0)
MCHC: 33.8 g/dL (ref 30.0–36.0)
MCV: 90.9 fL (ref 80.0–100.0)
Platelets: 134 K/uL — ABNORMAL LOW (ref 150–400)
RBC: 2.64 MIL/uL — ABNORMAL LOW (ref 3.87–5.11)
RDW: 15.4 % (ref 11.5–15.5)
WBC: 15.4 K/uL — ABNORMAL HIGH (ref 4.0–10.5)
nRBC: 0 % (ref 0.0–0.2)

## 2024-08-20 LAB — MAGNESIUM: Magnesium: 2.3 mg/dL (ref 1.7–2.4)

## 2024-08-20 LAB — GLUCOSE, CAPILLARY
Glucose-Capillary: 148 mg/dL — ABNORMAL HIGH (ref 70–99)
Glucose-Capillary: 160 mg/dL — ABNORMAL HIGH (ref 70–99)
Glucose-Capillary: 173 mg/dL — ABNORMAL HIGH (ref 70–99)
Glucose-Capillary: 193 mg/dL — ABNORMAL HIGH (ref 70–99)
Glucose-Capillary: 225 mg/dL — ABNORMAL HIGH (ref 70–99)
Glucose-Capillary: 226 mg/dL — ABNORMAL HIGH (ref 70–99)
Glucose-Capillary: 254 mg/dL — ABNORMAL HIGH (ref 70–99)

## 2024-08-20 LAB — PHOSPHORUS: Phosphorus: 3.7 mg/dL (ref 2.5–4.6)

## 2024-08-20 MED ORDER — POLYETHYLENE GLYCOL 3350 17 G PO PACK: 17.0000 g | PACK | Freq: Every day | ORAL | Status: DC

## 2024-08-20 MED ORDER — SENNA 8.6 MG PO TABS
1.0000 | ORAL_TABLET | Freq: Every day | ORAL | Status: DC
  Administered 2024-08-20: 8.6 mg via ORAL
  Filled 2024-08-20: qty 1

## 2024-08-20 MED ORDER — DOCUSATE SODIUM 100 MG PO CAPS: 100.0000 mg | ORAL_CAPSULE | Freq: Every day | ORAL | Status: DC

## 2024-08-20 MED ORDER — TRACE MINERALS CU-MN-SE-ZN 300-55-60-3000 MCG/ML IV SOLN
INTRAVENOUS | Status: AC
  Filled 2024-08-20: qty 672

## 2024-08-20 MED ORDER — FUROSEMIDE 10 MG/ML IJ SOLN
40.0000 mg | Freq: Two times a day (BID) | INTRAMUSCULAR | Status: DC
  Administered 2024-08-20 – 2024-08-21 (×2): 40 mg via INTRAVENOUS
  Filled 2024-08-20 (×2): qty 4

## 2024-08-20 MED ORDER — INSULIN GLARGINE 100 UNIT/ML ~~LOC~~ SOLN
18.0000 [IU] | Freq: Two times a day (BID) | SUBCUTANEOUS | Status: DC
  Administered 2024-08-20: 18 [IU] via SUBCUTANEOUS
  Filled 2024-08-20 (×3): qty 0.18

## 2024-08-20 MED ORDER — PIPERACILLIN-TAZOBACTAM IN DEX 2-0.25 GM/50ML IV SOLN
2.2500 g | Freq: Three times a day (TID) | INTRAVENOUS | Status: DC
  Administered 2024-08-20 – 2024-08-22 (×6): 2.25 g via INTRAVENOUS
  Filled 2024-08-20 (×7): qty 50

## 2024-08-20 MED ORDER — BOOST PLUS PO LIQD
237.0000 mL | Freq: Three times a day (TID) | ORAL | Status: DC
  Administered 2024-08-20 (×3): 237 mL via ORAL
  Filled 2024-08-20 (×5): qty 237

## 2024-08-20 MED ORDER — LIDOCAINE 5 % EX PTCH
2.0000 | MEDICATED_PATCH | CUTANEOUS | Status: AC
  Administered 2024-08-20 – 2024-09-03 (×14): 2 via TRANSDERMAL
  Filled 2024-08-20 (×14): qty 2

## 2024-08-20 NOTE — Progress Notes (Signed)
 Brief Nutrition Note:   Noted diet advanced to Regular but pt not eating much solid food, drinking liquids and eating some soft foods.   Pt likes Chocolate Flavor only, change supplement to Boost  Plus TID  TPN continues at 60 ml/hr  Labs reviewed. Creatinine up to 2.98. Almost 3L UOP in 24 hours with diuresis.   Last BM 1/21 (pt with BM x 2 that day), with last BM prior to that on 1/13.  Currently on miralax  and colace daily. +Nausea per RN, continues to feel full. Question if a trial of reglan /erythromycin for motility my be beneficial.   Meds reviewed: lasix , ss novolog , lantus  BID  Interventions: 1) Continue Regular diet, encourage small, frequent meals. Encourage sips of liquids through out the day as opposed to drinking large quantities at one time 2) Boost Plus po TID (chocolate only), each supplement provides 360 kcal and 14 grams of protein 3) Continue TPN to meet nutritional needs 4) May need to consider calorie count to better assess oral intake 5) Consider adjusting bowel regimen and/or addition of trial of reglan /erythromycin for motility   Betsey Finger MS, RDN, LDN, CNSC Registered Dietitian 3 Clinical Nutrition RD Inpatient Contact Info in Amion

## 2024-08-20 NOTE — Progress Notes (Signed)
 Patient ID: [name], female   DOB: 18-Aug-1955, 69 y.o.   MRN: [id number] [facility] Progress Note   Assessment/ Plan:   1. Acute kidney Injury: Suspected to have ATN secondary to ischemic and nephrotoxic etiologies.  Serologies negative for GN including vasculitis/anti-GBM given previous concerns of gross hematuria.  Likely at the plateau phase of ATN with excellent urine output augmented by furosemide ; I will decrease dose of furosemide  suspecting that she is likely tipping into volume contraction causing a rise of BUN/creatinine. 2.  Esophageal leak following resection of esophageal diverticulum: Underwent placement of a new esophageal stent previous one migrated distally into the stomach.  EGD and esophagram done showed no leak with recommendations to advance diet per primary service. 3.  Hypertensive urgency: Blood pressures improved following transition to oral/transdermal antihypertensive therapy. 4. Anemia: related to previous losses including hematuria.  Hemoglobin/hematocrit essentially stable overnight and without indication for PRBC transfusion or additional interventions.  Subjective:   Complains of pain across her chest/back.  Denies shortness of breath.   Objective:   BP 136/62 (BP Location: Left Arm)   Pulse 100   Temp 98.9 F (37.2 C) (Oral)   Resp 19   Ht 5' 3 (1.6 m)   Wt 71.2 kg   LMP 10/26/1998 Comment: ~IN 40s  SpO2 95%   BMI 27.81 kg/m   Intake/Output Summary (Last 24 hours) at 08/20/2024 0826 Last data filed at 08/20/2024 0800 Gross per 24 hour  Intake 3616.48 ml  Output 2940 ml  Net 676.48 ml   Weight change: 5.315 kg  Physical Exam: Gen: Appears uncomfortable resting in bed, holding herself still CVS: Pulse regular rhythm, normal rate, S1 and S2 normal Resp: Diminished breath sounds with poor inspiratory effort over bases.  Right-sided chest tube in situ Abd: Soft, nontender, bowel sounds normal Ext: No lower extremity  edema  Imaging: DG CHEST PORT 1 VIEW Result Date: 08/19/2024 CLINICAL DATA:  Pleural effusion EXAM: PORTABLE CHEST 1 VIEW COMPARISON:  08/19/2024, 08/18/2024, 08/17/2024 FINDINGS: Esophageal stent is noted. Stable cardiomediastinal silhouette with trace right effusion, unchanged. Interstitial and ground-glass opacity also without significant change. Postsurgical changes on the right corresponding to history of right middle lobectomy. Pleural calcifications at the right base. Right lower chest drainage catheter, similar in position. Right upper extremity central venous catheter tip overlies the upper SVC. IMPRESSION: 1. No significant change in the appearance of the chest with trace right pleural effusion and stable mild diffuse edema or infiltrates Electronically Signed   By: Luke Bun M.D.   On: 08/19/2024 18:18   DG Chest Port 1 View Result Date: 08/19/2024 CLINICAL DATA:  Status post lobectomy. EXAM: PORTABLE CHEST 1 VIEW COMPARISON:  08/18/2024 FINDINGS: Low volume film. Cardiopericardial silhouette is at upper limits of normal for size. Asymmetric bilateral relatively diffuse airspace disease is stable in the interval. Small right pleural effusion again noted. Esophageal stent device again noted. Right PICC line tip overlies the proximal to mid SVC level. No pneumothorax. Telemetry leads overlie the chest. IMPRESSION: No substantial interval change. Asymmetric bilateral airspace disease. Electronically Signed   By: Camellia Candle M.D.   On: 08/19/2024 06:36    Labs: BMET Recent Labs  Lab 08/15/24 0050 08/15/24 1800 08/16/24 0500 08/17/24 0958 08/18/24 0742 08/18/24 1451 08/19/24 0654 08/20/24 0258  NA 142 144 142 143 141 140 138 135  K 3.6 3.3* 3.5 3.4* 3.6 4.0 3.7 3.8  CL 111 109 107 108 106 104 101 97*  CO2 21* 22 25  23 24 23 25 23   GLUCOSE 246* 135* 122* 226* 159* 152* 158* 152*  BUN 50* 50* 46* 58* 62* 66* 66* 78*  CREATININE 2.55* 2.39* 2.55* 2.72* 2.58* 2.70* 2.66* 2.98*   CALCIUM 7.6* 8.1* 8.2* 8.1* 7.9* 7.8* 8.3* 7.9*  PHOS 3.3  --  2.2* 2.1* 2.7  --  3.1 3.7   CBC Recent Labs  Lab 08/14/24 1325 08/14/24 1923 08/18/24 0742 08/19/24 0536 08/19/24 0732 08/20/24 0258  WBC 10.8*   < > 16.9* 13.3* 16.0* 15.4*  NEUTROABS 8.4*  --   --   --   --   --   HGB 9.5*   < > 8.1* 7.5* 8.5* 8.1*  HCT 27.9*   < > 24.7* 22.5* 24.7* 24.0*  MCV 93.3   < > 93.6 97.8 91.1 90.9  PLT 198   < > 158 129* 168 134*   < > = values in this interval not displayed.    Medications:     acetaminophen   1,000 mg Oral Q6H   Chlorhexidine  Gluconate Cloth  6 each Topical Daily   famotidine   20 mg Oral Daily   feeding supplement (GLUCERNA SHAKE)  237 mL Oral TID BM   furosemide   40 mg Intravenous BID   heparin  injection (subcutaneous)  5,000 Units Subcutaneous Q8H   hydrALAZINE   25 mg Oral Q8H   HYDROmorphone   4 mg Oral Q6H   insulin  aspart  0-20 Units Subcutaneous Q4H   insulin  glargine  15 Units Subcutaneous BID   ipratropium-albuterol   3 mL Nebulization Q6H   metoprolol  tartrate  50 mg Oral BID   neomycin -bacitracin -polymyxin   Topical BID   NIFEdipine   60 mg Oral Daily   nitroGLYCERIN   0.3 mg Transdermal Daily   ondansetron  (ZOFRAN ) IV  4 mg Intravenous Q6H   sodium chloride  flush  10-40 mL Intracatheter Q12H    Gordy Blanch, MD 08/20/2024, 8:26 AM

## 2024-08-20 NOTE — Progress Notes (Addendum)
 TCTS DAILY ICU PROGRESS NOTE                   [address]            [place] [address]          [phone]   4 Days Post-Op Procedures (LRB): EGD (ESOPHAGOGASTRODUODENOSCOPY) (N/A) CONTROL OF HEMORRHAGE, GI TRACT, ENDOSCOPIC, BY CLIPPING OR OVERSEWING 7 days s/p bronchoscopy         14 days s/p EGD, reposition esophageal stent 16 days s/p EGD, placement of esophageal stent 25 days s/p Xi RML, LN dissection, intercostal nerve block, resection of esophageal diverticulum  Total Length of Stay:  LOS: 25 days   Subjective: Patient still with a fair amount of pain across chest/right side of chest/back. She denies nausea or abdominal pain. She had bleeding from injection site left thigh earlier this am (Heparin  [place])  Objective: Vital signs in last 24 hours: Temp:  [98.5 F (36.9 C)-100.4 F (38 C)] 100.4 F (38 C) (01/23 0400) Pulse Rate:  [76-109] 105 (01/23 0545) Cardiac Rhythm: Normal sinus rhythm (01/23 0000) Resp:  [12-26] 19 (01/23 0545) BP: (108-165)/(52-84) 128/57 (01/23 0518) SpO2:  [87 %-100 %] 91 % (01/23 0545) FiO2 (%):  [36 %] 36 % (01/22 1310) Weight:  [71.2 kg] 71.2 kg (01/23 0500)  Filed Weights   08/18/24 0630 08/19/24 0700 08/20/24 0500  Weight: 73.9 kg 65.9 kg 71.2 kg    Weight change: 5.315 kg      Intake/Output from previous day: 01/22 0701 - 01/23 0700 In: 3316.4 [P.O.:1560; I.V.:1452.3; IV Piggyback:304.1] Out: 2940 [Urine:2910; Chest Tube:30]  Intake/Output this shift: No intake/output data recorded.  Current Meds: Scheduled Meds:  acetaminophen   1,000 mg Oral Q6H   Chlorhexidine  Gluconate Cloth  6 each Topical Daily   famotidine   20 mg Oral Daily   feeding supplement (GLUCERNA SHAKE)  237 mL Oral TID BM   furosemide   60 mg Intravenous BID   heparin  injection (subcutaneous)  5,000 Units Subcutaneous Q8H   hydrALAZINE   25 mg Oral Q8H   HYDROmorphone   4 mg Oral Q6H   insulin  aspart  0-20 Units Subcutaneous Q4H   insulin   glargine  15 Units Subcutaneous BID   ipratropium-albuterol   3 mL Nebulization Q6H   metoprolol  tartrate  50 mg Oral BID   neomycin -bacitracin -polymyxin   Topical BID   NIFEdipine   60 mg Oral Daily   nitroGLYCERIN   0.3 mg Transdermal Daily   ondansetron  (ZOFRAN ) IV  4 mg Intravenous Q6H   sodium chloride  flush  10-40 mL Intracatheter Q12H   Continuous Infusions:  fluconazole  (DIFLUCAN ) IV 400 mg (08/19/24 1113)   piperacillin -tazobactam (ZOSYN )  IV 12.5 mL/hr at 08/20/24 0700   promethazine  (PHENERGAN ) injection (IM or IVPB) Stopped (08/19/24 1554)   TPN ADULT (ION) 60 mL/hr at 08/20/24 0700   PRN Meds:.bisacodyl , diphenhydrAMINE , HYDROmorphone  (DILAUDID ) injection, levalbuterol , lidocaine -prilocaine , lip balm, LORazepam , mouth rinse, phenol, promethazine  (PHENERGAN ) injection (IM or IVPB), sodium chloride  flush  General appearance: alert, cooperative, and no distress Neurologic: intact Heart: Slightly tachycardic this am Lungs: Slightly diminished right basilar breath sounds Abdomen: Soft, non tender, sporadic bowel sounds Wounds: Clean and dry.No drainage around right chest tube and suture intact Chest tube: to water seal,light colored output  Lab Results: CBC: Recent Labs    08/19/24 0732 08/20/24 0258  WBC 16.0* 15.4*  HGB 8.5* 8.1*  HCT 24.7* 24.0*  PLT 168 134*   BMET:  Recent Labs    08/19/24  0654 08/20/24 0258  NA 138 135  K 3.7 3.8  CL 101 97*  CO2 25 23  GLUCOSE 158* 152*  BUN 66* 78*  CREATININE 2.66* 2.98*  CALCIUM 8.3* 7.9*    CMET: Lab Results  Component Value Date   WBC 15.4 (H) 08/20/2024   HGB 8.1 (L) 08/20/2024   HCT 24.0 (L) 08/20/2024   PLT 134 (L) 08/20/2024   GLUCOSE 152 (H) 08/20/2024   TRIG 211 (H) 08/16/2024   ALT 24 08/19/2024   AST 37 08/19/2024   NA 135 08/20/2024   K 3.8 08/20/2024   CL 97 (L) 08/20/2024   CREATININE 2.98 (H) 08/20/2024   BUN 78 (H) 08/20/2024   CO2 23 08/20/2024   TSH 3.532 02/28/2022   INR 1.0  08/18/2024   HGBA1C 5.9 (H) 07/20/2024      PT/INR:  Recent Labs    08/18/24 0742  LABPROT 13.7  INR 1.0   Radiology: DG CHEST PORT 1 VIEW Result Date: 08/19/2024 CLINICAL DATA:  Pleural effusion EXAM: PORTABLE CHEST 1 VIEW COMPARISON:  08/19/2024, 08/18/2024, 08/17/2024 FINDINGS: Esophageal stent is noted. Stable cardiomediastinal silhouette with trace right effusion, unchanged. Interstitial and ground-glass opacity also without significant change. Postsurgical changes on the right corresponding to history of right middle lobectomy. Pleural calcifications at the right base. Right lower chest drainage catheter, similar in position. Right upper extremity central venous catheter tip overlies the upper SVC. IMPRESSION: 1. No significant change in the appearance of the chest with trace right pleural effusion and stable mild diffuse edema or infiltrates Electronically Signed   By: Luke Bun M.D.   On: 08/19/2024 18:18   Assessment/Plan: S/P Procedures (LRB): EGD (ESOPHAGOGASTRODUODENOSCOPY) (N/A) CONTROL OF HEMORRHAGE, GI TRACT, ENDOSCOPIC, BY CLIPPING OR OVERSEWING  1. CV - ST this am. BP under better control. On Clonidine  patch 0.3 mg, Amlodipine  5 mg daily, Hydralazine  25 mg tid, Lopressor  50 mg bid,   Nitroglycerin  patch 0.3 mg, and Procardia  XL 60 mg daily. 2.  Pulmonary - On 6 L of oxygen via [place]. Chest tube is to water seal, light yellowish drainage. Chest tube with 30 cc last 24 hours. On Duo Nebs. Check CXR in am. Encourage incentive spirometer 4. CBGs 148/173/221. On TPN so continue accu checks. 5. Anemia-H and H this am stable at 8.1 and 24 6. ID-on Zosyn  and Diflucan  for esophageal leak, aspiration PNA. Had fever to 100.4 yesterday. WBC this am slightly decreased to 15,400. Respiratory gram stain shows moderate gram negative rods. Respiratory culture shows few normal respiratory flora-no Staph aureus or Pseudomonas seen. Blood culture shows no growth. Bronchial alveolar lavage  shows culture shows no Staph or Pseudomonas and no anaerobes. 7. GI-on full liquid diet, TPN, Glucerna shakes 8. History of tobacco abuse-continue Nicotine  patch 9. AKI, gross hematuria-creatinine this am slightly increased to 2.98. On Lasix  60 mg IV bid. Per nephrology, no need for HD yet. Await this am's result 10. On Heparin  injection [place] tid. Platelets this am slightly decreased to 134,000. Monitor 11. Regarding pain control, on Tylenol  Q 6, Dilaudid  4 mg every 6 hours,  12. Very deconditioned-must ambulate   Donielle CHRISTELLA Donald PA-C 08/20/2024 7:21 AM Patient seen and examined, rounds with CCM C/o indigestion, likely having some reflux Hospital out of chocolate Ensure which is the only kind she will drink Hopefully can find a supplement she can take and dc TNA  Elspeth C. Kerrin, MD [facility] [phone]

## 2024-08-20 NOTE — Progress Notes (Signed)
 PHARMACY - TOTAL PARENTERAL NUTRITION CONSULT NOTE  Indication: Esophageal leak  Patient Measurements: Height: 5' 3 (160 cm) Weight: 71.2 kg (157 lb) IBW/kg (Calculated) : 52.4 TPN AdjBW (KG): 55.8 Body mass index is 27.81 kg/m.  Assessment:  75 YOF with NSCLC here for RML lobectomy, resection of esophageal diverticulum, and lymph node dissection on 12/29.  Found to have a small contained leak post-op.   Patient reports an intentional weight loss of 40 lbs over a couple of years when she was diagnosed with DM.  She typically drinks one [place] shake every morning for breakfast and eats one meal a day, which could be pizza, spaghetti, chicken, beef or fish along with vegetables and fruits.  Patient was eating normally until her planned surgery. RD concerned with patient being malnourished. Patient was on TPN 12/31 - 1/12 and was advanced to dysphagia 1 diet. However, she was NPO again 1/14-1/16. On 1/17 IR reported small amount of leak from distal esophageal stent. Patient developed AKI since TPN was held. Pharmacy reconsulted for TPN 1/17.  Nephrology following. Serologies negative. Suspect plateau phase of ATN. No RRT. Good urine output.   Glucose / Insulin : hx DM, A1c 5.9% - BG 150-220s, 30 units SSI/24h, glargine 15u BID per TCTS, moderate SSI Q4H - CBG better control after increase to Q4H yesterday  Electrolytes: Na 135, K 3.8 (40 mEq KCl outside TPN 1/22), CO2 23 (1:1 TPN), Phos: 3.7, Mg: 2.3, CoCa: 8.9  Renal: AKI SCr 2.98 up (BL 0.8), BUN 78 up Hepatic: 1/22- LFTs / tbili WNL, albumin  2.8, TG 211 1/19 Intake / Output; MIVF: lasix  60mg  IV BID added 1/21, UOP 2910 mL (1.7 ml/kg/hr), LBM 1/21, chest tube 30 mL Net IO Since Admission: 17,774.36 mL [08/20/24 0714]  GI Imaging:   12/30 esophagram: Positive for contrast extravasation and an esophageal leak in the mid esophagus 1/2 esophagram: Persistent contrast extravasation and esophageal leak, similar to prior 1/6 esophagram: Limited  contrast study due to patient aspiration and severe coughing spell. Study aborted. 1/12 esophagram: Patent esophageal stent. No evidence for extravasation or leak, Delayed esophageal emptying and gastroesophageal reflux leading to stasis of contrast in the esophagus. 1/15 CT: displaced esophageal stent in stomach, no bowel obstruction  1/17 esophagram: small volume esophageal leak at distal stent 1/20 esophagram: no evidence of any leak  GI Surgeries / Procedures:  12/30 right middle lobectomy, resection esophageal diverticulum, lymph node biopsy  1/7 EGD- stent placement  1/9 EGD stent repositioned d/t migration to stomach  1/16 EGD- stent placed at esophageal ulcer, removed gastric stent, normal duoenum  1/19 EGD - stent replaced / repositioned   Central access: PICC 07/28/24 TPN start date: 07/28/24 > 1/12; 1/17 >>   Nutritional Goals:  Goal unconcentrated TPN rate 75 ml/hr to provide 101g AA and 1721 kCal Goal concentrated TPN rate 60 mL/hr to provide 101 g AA and 1743 kCal  RD Estimated Needs Total Energy Estimated Needs: 1700-1900 Total Protein Estimated Needs: 100-115g Total Fluid Estimated Needs: >1.7L/day  Current Nutrition:  TPN 1/17 >> 1/08 CLD - passed swallow 1/09 CLD - patient feels can't swallow well > NPO 1/13 advanced to DYS1 1/14 NPO  1/21 FLD, Glucerna x1 on 1/21, milkshake per patient   Plan:  Continue concentrated TPN at goal 60 mL/hr at 1800 given ongoing diuresis, meeting 100% estimated needs. > follow oral intake for transition to oral diet and off of TPN. Continue TPN per TCTS Electrolytes in TPN: increase to Na 85 mEq/L, increase to K  40 mEq/L (= 58 mEq), Ca 3 mEq/L, Mg 7 mEq/L, Phos 7 mmol/L. Cl:Ac 1:1  2g mag sulfate outside of TPN.  Add standard MVI and trace elements to TPN Increase resistent SSI to q4h and adjust as needed > suspect elevations are due to enteral diet versus TPN Monitor TPN labs on Mon/Thurs, and PRN  Thank you for allowing  pharmacy to be a part of this patients care.  Shelba Collier, PharmD, BCPS Clinical Pharmacist

## 2024-08-20 NOTE — Progress Notes (Signed)
 Inpatient Rehab Admissions Coordinator:   Per therapy recommendations,  patient was screened for CIR candidacy by Leita Kleine, MS, CCC-SLP. At this time,  Pt is walking 400 ft with min A to [facility] and CIR will not have beds available until the middle of next week. I anticipate Pt. Will likely progress to be able to d/c directly home by then. I will not place    order for rehab consult at this time. Please contact me any with questions.  Leita Kleine, MS, CCC-SLP Rehab Admissions Coordinator  [phone] (celll) [phone] (office)

## 2024-08-20 NOTE — Evaluation (Signed)
 Physical Therapy Evaluation Patient Details Name: [name] MRN: [id number] DOB: 25-Jul-1956 Today's Date: 08/20/2024  History of Present Illness  69 yo female presented to [facility] 07/26/24 for Right middle lobectomy to remove non-small cell carcinoma and resection of esophogeal diverticulum. Returned to OR 08/04/24 for EGD with esophageal stent, s/p esophageal perforation. 08/06/24 return to OR for replacement of stent due to migration into stomach. 1/16 bronchoscopy, and bronchoalveolar lavage, replacement of new esophagus stent with clipping, 08/16/24 upper GI endoscopy. New AKI EFY:unajrrn abuse, positive tuberculosis test as a young woman, hypertension, hyperlipidemia, heart murmur, type 2 diabetes, and anxiety.  Clinical Impression  PTA pt living with husband in single story home with 4 steps to enter. Pt completely independent prior to hospitalization. Pt is currently limited in safe mobility by 7/10 chest and back pain, increased O2 demand, and generalized weakness and associated balance deficits. Pt is currently modA for bed mobility, min A for transfers and min A for ambulation with close chair follow for rest breaks. Pt reports she thinks 24/7 assist can be attained. Patient will benefit from intensive inpatient follow-up therapy, >3 hours/day. PT will continue to follow acutely.        If plan is discharge home, recommend the following: A little help with bathing/dressing/bathroom;A little help with walking and/or transfers;Assistance with cooking/housework;Assist for transportation;Help with stairs or ramp for entrance   Can travel by private vehicle    Yes    Equipment Recommendations Rolling walker (2 wheels);BSC/3in1 (may have at home?)  Recommendations for Other Services  Rehab consult    Functional Status Assessment Patient has had a recent decline in their functional status and demonstrates the ability to make significant improvements in function in a reasonable and predictable  amount of time.     Precautions / Restrictions Precautions Precautions: None Precaution/Restrictions Comments: chest tube, TPN Restrictions Weight Bearing Restrictions Per Provider Order: No      Mobility  Bed Mobility Overal bed mobility: Needs Assistance Bed Mobility: Supine to Sit, Sit to Supine     Supine to sit: Mod assist Sit to supine: Contact guard assist   General bed mobility comments: pt able to bring LE off bed, requires modA for pad scoot to EoB and bringing trunk to upright, CGA for safety with return to supine    Transfers Overall transfer level: Needs assistance Equipment used: Rolling walker (2 wheels) Transfers: Sit to/from Stand Sit to Stand: Min assist           General transfer comment: pt attempts power up and needs min A for coming to fully upright and steady in RW    Ambulation/Gait Ambulation/Gait assistance: Min assist, Contact guard assist Gait Distance (Feet): 200 Feet (+200) Assistive device: Rolling walker (2 wheels) Gait Pattern/deviations: Step-through pattern, Decreased step length - right, Decreased step length - left, Shuffle, Trunk flexed Gait velocity: slowed Gait velocity interpretation: <1.31 ft/sec, indicative of household ambulator   General Gait Details: contact guard progressing to min A for 2 bouts of ambulation in hallway with seated rest break inbetweed, pt with very slowed shuffling gait         Balance Overall balance assessment: Needs assistance Sitting-balance support: Feet unsupported, No upper extremity supported Sitting balance-Leahy Scale: Good     Standing balance support: Reliant on assistive device for balance, During functional activity, Bilateral upper extremity supported Standing balance-Leahy Scale: Poor  Pertinent Vitals/Pain Pain Assessment Pain Assessment: 0-10 Pain Score: 7  Pain Location: chest and back Pain Descriptors / Indicators: Constant,  Sharp, Shooting, Tender Pain Intervention(s): Limited activity within patient's tolerance    Home Living Family/patient expects to be discharged to:: Private residence Living Arrangements: Spouse/significant other Available Help at Discharge: Family;Available 24 hours/day Type of Home: House Home Access: Stairs to enter Entrance Stairs-Rails: Doctor, General Practice of Steps: 4   Home Layout: One level Home Equipment:  (unsure)      Prior Function Prior Level of Function : Independent/Modified Independent             Mobility Comments: tourist information centre manager, driver ADLs Comments: independent with ADLs and iADLs     Extremity/Trunk Assessment   Upper Extremity Assessment Upper Extremity Assessment: Defer to OT evaluation    Lower Extremity Assessment Lower Extremity Assessment: Generalized weakness    Cervical / Trunk Assessment Cervical / Trunk Assessment: Other exceptions (chest tube)  Communication   Communication Communication: No apparent difficulties    Cognition Arousal: Alert Behavior During Therapy: Flat affect   PT - Cognitive impairments: No apparent impairments                       PT - Cognition Comments: decreased awareness of how weak she has gotten Following commands: Intact       Cueing Cueing Techniques: Verbal cues, Gestural cues, Tactile cues     General Comments General comments (skin integrity, edema, etc.): Pt on 4L O2 via [place], difficult getting good plethwave with walking but when pleth wave good SpO2 on low 90s, HR in 110s with ambulation        Assessment/Plan    PT Assessment Patient needs continued PT services  PT Problem List Decreased strength;Decreased activity tolerance;Decreased balance;Decreased mobility;Cardiopulmonary status limiting activity;Pain       PT Treatment Interventions DME instruction;Gait training;Stair training;Functional mobility training;Therapeutic activities;Therapeutic  exercise;Balance training;Cognitive remediation;Patient/family education    PT Goals (Current goals can be found in the Care Plan section)  Acute Rehab PT Goals PT Goal Formulation: With patient Time For Goal Achievement: 09/03/24 Potential to Achieve Goals: Fair    Frequency Min 3X/week        AM-PAC PT 6 Clicks Mobility  Outcome Measure Help needed turning from your back to your side while in a flat bed without using bedrails?: None Help needed moving from lying on your back to sitting on the side of a flat bed without using bedrails?: A Lot Help needed moving to and from a bed to a chair (including a wheelchair)?: A Little Help needed standing up from a chair using your arms (e.g., wheelchair or bedside chair)?: A Little Help needed to walk in hospital room?: A Lot Help needed climbing 3-5 steps with a railing? : Total 6 Click Score: 15    End of Session Equipment Utilized During Treatment: Gait belt;Oxygen Activity Tolerance: Patient tolerated treatment well;Patient limited by pain Patient left: in bed;with call bell/phone within reach;with bed alarm set;with nursing/sitter in room Nurse Communication: Mobility status PT Visit Diagnosis: Unsteadiness on feet (R26.81);Muscle weakness (generalized) (M62.81);Difficulty in walking, not elsewhere classified (R26.2);Pain Pain - part of body:  (chest)    Time: 8970-8870 PT Time Calculation (min) (ACUTE ONLY): 60 min   Charges:   PT Evaluation $PT Eval Moderate Complexity: 1 Mod PT Treatments $Gait Training: 23-37 mins $Therapeutic Activity: 8-22 mins PT General Charges $$ ACUTE PT VISIT: 1 Visit  Hodges Treiber B. Fleeta Lapidus PT, DPT Acute Rehabilitation Services Please use secure chat or  Call Office [phone]   Almarie KATHEE Fleeta [facility] 08/20/2024, 12:02 PM

## 2024-08-20 NOTE — Progress Notes (Signed)
 TCTS Evening Rounds:  Tmax 100.6 this afternoon.  Sats 96% 6L  Taking po diet without difficulty.

## 2024-08-20 NOTE — Plan of Care (Signed)
" °  Problem: Bowel/Gastric: Goal: Gastrointestinal status for postoperative course will improve Outcome: Progressing   Problem: Cardiac: Goal: Ability to maintain an adequate cardiac output Outcome: Progressing   Problem: Clinical Measurements: Goal: Postoperative complications will be avoided or minimized Outcome: Progressing   Problem: Respiratory: Goal: Will regain and/or maintain adequate ventilation Outcome: Progressing   Problem: Respiratory: Goal: Respiratory status will improve Outcome: Progressing   Problem: Skin Integrity: Goal: Demonstrates signs of wound healing without infection Outcome: Progressing   "

## 2024-08-20 NOTE — Progress Notes (Signed)
 "  NAME:  [name], MRN:  [id number], DOB:  [birth year], LOS: 25 ADMISSION DATE:  07/26/2024, CONSULTATION DATE:  1/15 REFERRING MD:  Kerrin, CHIEF COMPLAINT:  sepsis   History of Present Illness:  [name] is a 69 y/o woman with a history of tobacco abuse and lung adenocarcinoma status post RML lobectomy on 12/29.  At the time of surgery was a large cyst that was resected. Despite oversewing it time resection she later developed an esophageal leak.  She required an esophageal stent which she has had recurrent problems with related to positioning/migration of the stent (necessitating multiple EGDs for stent revision). From 1/15 through 1/18, the patient was consistently total body balance positive in setting of receiving NS @ 100 mL/hr and then starting TPN as of 1/18. Due to concomitant AKI, developed volume overload and pulmonary edema and hypertensive urgency ultimately requiring a Cardene  drip be started overnight 1/19. Also on 1/19, she underwent revision of the position of her esophageal stent to address a small esophageal leak.  Pertinent  Medical History  Lung adenocarcinoma Latent  TB, treated in 1980 HTN HLD DM OSA Tobacco abuse  Significant Hospital Events: Including procedures, antibiotic start and stop dates in addition to other pertinent events   12/29 RML lobectomy 1/7 EGD with esophageal stent 1/9 EGD  with stent reposition after migration to stomach 1/15 vanc added, zosyn  restarted. CT  1/19: EGD for stent repositioning due to ongoing small esophageal leak / stent migration 1/20: Gastrograffin study -> no esophageal leak. Started on clear liquid diet. 1/21: Full liquid diet. Diuresed.  Interim History / Subjective:  Poor PO intake, gets full easily.  No increase in coughing when eating. Tmax 100.4  Objective    Blood pressure 136/62, pulse 100, temperature 98.9 F (37.2 C), temperature source Oral, resp. rate 19, height 5' 3 (1.6 m), weight 71.2 kg, last  menstrual period 10/26/1998, SpO2 95%.    FiO2 (%):  [36 %] 36 %   Intake/Output Summary (Last 24 hours) at 08/20/2024 0934 Last data filed at 08/20/2024 0800 Gross per 24 hour  Intake 3543.98 ml  Output 2940 ml  Net 603.98 ml   Filed Weights   08/18/24 0630 08/19/24 0700 08/20/24 0500  Weight: 73.9 kg 65.9 kg 71.2 kg    Examination: General: chronically ill appearing woman lying in bed in NAD HENT: [place]/AT, eyes anicteric Lungs:  breathing comfortably on 6L [place], improving rhales compared to previous exams.  Cardiovascular: S1S2, RRR Abdomen: soft, NT Extremities: minimal muscle mass, no edema Neuro: awake, alert, moving all extremities   I/O balance: +500cc, net +17L for admission  Resolved problem list   Assessment and Plan   Esophageal leak- S/p esophageal stenting c/b migration, now s/p repositioning by GI most recently on 1/19 -monitor R chest tube output -need to push PO intake to work on getting back off TPN; limit carbonation -appreciate GI & TCTS management  AKI c/b ATN and hypervolemia Hypertensive urgency -strict I/O -renally dose meds, avoid nephrotoxic meds -maintain adequate perfusion; now off cardene   -con't metoprolol  BID, nifedipine  -holding PTA losartan  Lung adenocarcinoma, s/p lobectomy -post-op care per TCTS -con't to monitor chest tube output -con't dilaudid  PO  H/o tobacco abuse -recommend cessation  Hyperglycemia, not fully controlled -glargine increase to 18 units BID -SSI PRN -keep insulin  in TPN per pharmacy -goal BG 140-180 -holding PTA jardiance  Deconditioning -mobilize, PT & OT ordered   Labs   CBC: Recent Labs  Lab 08/14/24 1325 08/14/24 1923  08/18/24 0207 08/18/24 0742 08/19/24 0536 08/19/24 0732 08/20/24 0258  WBC 10.8*   < > 16.8* 16.9* 13.3* 16.0* 15.4*  NEUTROABS 8.4*  --   --   --   --   --   --   HGB 9.5*   < > 8.8* 8.1* 7.5* 8.5* 8.1*  HCT 27.9*   < > 25.7* 24.7* 22.5* 24.7* 24.0*  MCV 93.3   < > 92.4  93.6 97.8 91.1 90.9  PLT 198   < > 165 158 129* 168 134*   < > = values in this interval not displayed.    Basic Metabolic Panel: Recent Labs  Lab 08/15/24 0050 08/15/24 1800 08/16/24 0500 08/17/24 0958 08/18/24 0742 08/18/24 1451 08/19/24 0654 08/20/24 0258  NA 142   < > 142 143 141 140 138 135  K 3.6   < > 3.5 3.4* 3.6 4.0 3.7 3.8  CL 111   < > 107 108 106 104 101 97*  CO2 21*   < > 25 23 24 23 25 23   GLUCOSE 246*   < > 122* 226* 159* 152* 158* 152*  BUN 50*   < > 46* 58* 62* 66* 66* 78*  CREATININE 2.55*   < > 2.55* 2.72* 2.58* 2.70* 2.66* 2.98*  CALCIUM 7.6*   < > 8.2* 8.1* 7.9* 7.8* 8.3* 7.9*  MG 2.0  --  1.7 2.0  --   --  1.5* 2.3  PHOS 3.3  --  2.2* 2.1* 2.7  --  3.1 3.7   < > = values in this interval not displayed.   GFR: Estimated Creatinine Clearance: 17.6 mL/min (A) (by C-G formula based on SCr of 2.98 mg/dL (H)). Recent Labs  Lab 08/18/24 0742 08/19/24 0536 08/19/24 0732 08/20/24 0258  WBC 16.9* 13.3* 16.0* 15.4*    Kelly SHAUNNA Gaskins, DO 08/20/24 9:50 AM [facility]  For contact information, see Amion. If no response to pager, please call PCCM 2H APP. After hours, 7PM- 7AM, please call on call APP for 2H.  "

## 2024-08-20 NOTE — Progress Notes (Signed)
 Subjective: Feeling well today.  Objective: Vital signs in last 24 hours: Temp:  [98.5 F (36.9 C)-100.6 F (38.1 C)] 100.6 F (38.1 C) (01/23 1643) Pulse Rate:  [90-110] 103 (01/23 1900) Resp:  [12-26] 15 (01/23 1900) BP: (110-152)/(45-93) 120/61 (01/23 1900) SpO2:  [83 %-98 %] 92 % (01/23 1900) Weight:  [71.2 kg] 71.2 kg (01/23 0500) Last BM Date : 08/18/24  Intake/Output from previous day: 01/22 0701 - 01/23 0700 In: 3436.4 [P.O.:1680; I.V.:1452.3; IV Piggyback:304.1] Out: 2940 [Urine:2910; Chest Tube:30] Intake/Output this shift: No intake/output data recorded.  General appearance: alert and no distress GI: soft, non-tender; bowel sounds normal; no masses,  no organomegaly  Lab Results: Recent Labs    08/19/24 0536 08/19/24 0732 08/20/24 0258  WBC 13.3* 16.0* 15.4*  HGB 7.5* 8.5* 8.1*  HCT 22.5* 24.7* 24.0*  PLT 129* 168 134*   BMET Recent Labs    08/18/24 1451 08/19/24 0654 08/20/24 0258  NA 140 138 135  K 4.0 3.7 3.8  CL 104 101 97*  CO2 23 25 23   GLUCOSE 152* 158* 152*  BUN 66* 66* 78*  CREATININE 2.70* 2.66* 2.98*  CALCIUM 7.8* 8.3* 7.9*   LFT Recent Labs    08/19/24 0654  PROT 5.6*  ALBUMIN  2.8*  AST 37  ALT 24  ALKPHOS 47  BILITOT 0.4   PT/INR Recent Labs    08/18/24 0742  LABPROT 13.7  INR 1.0   Hepatitis Panel No results for input(s): HEPBSAG, HCVAB, HEPAIGM, HEPBIGM in the last 72 hours. C-Diff No results for input(s): CDIFFTOX in the last 72 hours. Fecal Lactopherrin No results for input(s): FECLLACTOFRN in the last 72 hours.  Studies/Results: DG CHEST PORT 1 VIEW Result Date: 08/19/2024 CLINICAL DATA:  Pleural effusion EXAM: PORTABLE CHEST 1 VIEW COMPARISON:  08/19/2024, 08/18/2024, 08/17/2024 FINDINGS: Esophageal stent is noted. Stable cardiomediastinal silhouette with trace right effusion, unchanged. Interstitial and ground-glass opacity also without significant change. Postsurgical changes on the right  corresponding to history of right middle lobectomy. Pleural calcifications at the right base. Right lower chest drainage catheter, similar in position. Right upper extremity central venous catheter tip overlies the upper SVC. IMPRESSION: 1. No significant change in the appearance of the chest with trace right pleural effusion and stable mild diffuse edema or infiltrates Electronically Signed   By: Luke Bun M.D.   On: 08/19/2024 18:18   DG Chest Port 1 View Result Date: 08/19/2024 CLINICAL DATA:  Status post lobectomy. EXAM: PORTABLE CHEST 1 VIEW COMPARISON:  08/18/2024 FINDINGS: Low volume film. Cardiopericardial silhouette is at upper limits of normal for size. Asymmetric bilateral relatively diffuse airspace disease is stable in the interval. Small right pleural effusion again noted. Esophageal stent device again noted. Right PICC line tip overlies the proximal to mid SVC level. No pneumothorax. Telemetry leads overlie the chest. IMPRESSION: No substantial interval change. Asymmetric bilateral airspace disease. Electronically Signed   By: Camellia Candle M.D.   On: 08/19/2024 06:36    Medications: Scheduled:  acetaminophen   1,000 mg Oral Q6H   Chlorhexidine  Gluconate Cloth  6 each Topical Daily   [START ON 08/21/2024] docusate sodium   100 mg Oral Daily   famotidine   20 mg Oral Daily   furosemide   40 mg Intravenous BID   heparin  injection (subcutaneous)  5,000 Units Subcutaneous Q8H   hydrALAZINE   25 mg Oral Q8H   HYDROmorphone   4 mg Oral Q6H   insulin  aspart  0-20 Units Subcutaneous Q4H   insulin  glargine  18 Units Subcutaneous  BID   ipratropium-albuterol   3 mL Nebulization Q6H   lactose free nutrition  237 mL Oral TID BM   lidocaine   2 patch Transdermal Q24H   metoprolol  tartrate  50 mg Oral BID   neomycin -bacitracin -polymyxin   Topical BID   NIFEdipine   60 mg Oral Daily   nitroGLYCERIN   0.3 mg Transdermal Daily   ondansetron  (ZOFRAN ) IV  4 mg Intravenous Q6H   [START ON 08/21/2024]  polyethylene glycol  17 g Oral Daily   senna  1 tablet Oral Daily   sodium chloride  flush  10-40 mL Intracatheter Q12H   Continuous:  fluconazole  (DIFLUCAN ) IV 400 mg (08/20/24 9077)   piperacillin -tazobactam (ZOSYN )  IV Stopped (08/20/24 1521)   promethazine  (PHENERGAN ) injection (IM or IVPB) Stopped (08/19/24 1554)   TPN ADULT (ION) 60 mL/hr at 08/20/24 1900    Assessment/Plan: 1) Esophageal leak s/p esophageal stent placement. 2) Low grade fever. 3) Relatively stable anemia with no overt signs of GI bleed.   Clinically she remains well.  From the GI standpoint she can be advanced with her diet to a regular diet provided that she chews well.  Nursing reports only serosanguinous drainage, but it is minimal.  Plan: 1) Continue with antibiotics. 2) Monitor CT output. 3) Monitor HGB.  LOS: 25 days   Simran Mannis D 08/20/2024, 7:27 PM

## 2024-08-21 ENCOUNTER — Inpatient Hospital Stay (HOSPITAL_COMMUNITY)

## 2024-08-21 DIAGNOSIS — J9601 Acute respiratory failure with hypoxia: Secondary | ICD-10-CM

## 2024-08-21 DIAGNOSIS — R609 Edema, unspecified: Secondary | ICD-10-CM

## 2024-08-21 LAB — PHOSPHORUS: Phosphorus: 4.3 mg/dL (ref 2.5–4.6)

## 2024-08-21 LAB — MRSA NEXT GEN BY PCR, NASAL: MRSA by PCR Next Gen: NOT DETECTED

## 2024-08-21 LAB — CBC
HCT: 21.7 % — ABNORMAL LOW (ref 36.0–46.0)
Hemoglobin: 7.6 g/dL — ABNORMAL LOW (ref 12.0–15.0)
MCH: 33.5 pg (ref 26.0–34.0)
MCHC: 35 g/dL (ref 30.0–36.0)
MCV: 95.6 fL (ref 80.0–100.0)
Platelets: 131 10*3/uL — ABNORMAL LOW (ref 150–400)
RBC: 2.27 MIL/uL — ABNORMAL LOW (ref 3.87–5.11)
RDW: 15.8 % — ABNORMAL HIGH (ref 11.5–15.5)
WBC: 17.7 10*3/uL — ABNORMAL HIGH (ref 4.0–10.5)
nRBC: 0 % (ref 0.0–0.2)

## 2024-08-21 LAB — BASIC METABOLIC PANEL WITH GFR
Anion gap: 16 — ABNORMAL HIGH (ref 5–15)
BUN: 84 mg/dL — ABNORMAL HIGH (ref 8–23)
CO2: 22 mmol/L (ref 22–32)
Calcium: 8.3 mg/dL — ABNORMAL LOW (ref 8.9–10.3)
Chloride: 95 mmol/L — ABNORMAL LOW (ref 98–111)
Creatinine, Ser: 3.25 mg/dL — ABNORMAL HIGH (ref 0.44–1.00)
GFR, Estimated: 15 mL/min — ABNORMAL LOW
Glucose, Bld: 163 mg/dL — ABNORMAL HIGH (ref 70–99)
Potassium: 3.7 mmol/L (ref 3.5–5.1)
Sodium: 132 mmol/L — ABNORMAL LOW (ref 135–145)

## 2024-08-21 LAB — GLUCOSE, CAPILLARY
Glucose-Capillary: 234 mg/dL — ABNORMAL HIGH (ref 70–99)
Glucose-Capillary: 146 mg/dL — ABNORMAL HIGH (ref 70–99)
Glucose-Capillary: 157 mg/dL — ABNORMAL HIGH (ref 70–99)
Glucose-Capillary: 169 mg/dL — ABNORMAL HIGH (ref 70–99)
Glucose-Capillary: 138 mg/dL — ABNORMAL HIGH (ref 70–99)
Glucose-Capillary: 173 mg/dL — ABNORMAL HIGH (ref 70–99)

## 2024-08-21 LAB — SEDIMENTATION RATE: Sed Rate: 122 mm/h — ABNORMAL HIGH (ref 0–22)

## 2024-08-21 LAB — PRO BRAIN NATRIURETIC PEPTIDE: Pro Brain Natriuretic Peptide: 3099 pg/mL — ABNORMAL HIGH

## 2024-08-21 MED ORDER — LINEZOLID 600 MG/300ML IV SOLN
600.0000 mg | Freq: Two times a day (BID) | INTRAVENOUS | Status: DC
  Administered 2024-08-21 – 2024-08-27 (×12): 600 mg via INTRAVENOUS
  Filled 2024-08-21 (×13): qty 300

## 2024-08-21 MED ORDER — ACETAMINOPHEN 10 MG/ML IV SOLN
1000.0000 mg | Freq: Four times a day (QID) | INTRAVENOUS | Status: AC
  Administered 2024-08-21 – 2024-08-22 (×4): 1000 mg via INTRAVENOUS
  Filled 2024-08-21 (×4): qty 100

## 2024-08-21 MED ORDER — TRACE MINERALS CU-MN-SE-ZN 300-55-60-3000 MCG/ML IV SOLN
INTRAVENOUS | Status: AC
  Filled 2024-08-21: qty 672

## 2024-08-21 MED ORDER — FAMOTIDINE IN NACL 20-0.9 MG/50ML-% IV SOLN
20.0000 mg | Freq: Every day | INTRAVENOUS | Status: DC
  Administered 2024-08-21 – 2024-08-22 (×2): 20 mg via INTRAVENOUS
  Filled 2024-08-21 (×2): qty 50

## 2024-08-21 MED ORDER — INSULIN GLARGINE 100 UNIT/ML ~~LOC~~ SOLN
20.0000 [IU] | Freq: Every day | SUBCUTANEOUS | Status: DC
  Administered 2024-08-22 – 2024-08-23 (×2): 20 [IU] via SUBCUTANEOUS
  Filled 2024-08-21 (×3): qty 0.2

## 2024-08-21 MED ORDER — POTASSIUM CHLORIDE CRYS ER 20 MEQ PO TBCR
40.0000 meq | EXTENDED_RELEASE_TABLET | Freq: Once | ORAL | Status: AC
  Administered 2024-08-21: 40 meq via ORAL
  Filled 2024-08-21: qty 2

## 2024-08-21 MED ORDER — FUROSEMIDE 10 MG/ML IJ SOLN
40.0000 mg | Freq: Once | INTRAMUSCULAR | Status: AC
  Administered 2024-08-21: 40 mg via INTRAVENOUS
  Filled 2024-08-21: qty 4

## 2024-08-21 MED ORDER — INSULIN GLARGINE 100 UNIT/ML ~~LOC~~ SOLN
20.0000 [IU] | Freq: Two times a day (BID) | SUBCUTANEOUS | Status: DC
  Administered 2024-08-21: 20 [IU] via SUBCUTANEOUS
  Filled 2024-08-21 (×2): qty 0.2

## 2024-08-21 MED ORDER — CLEVIDIPINE BUTYRATE 0.5 MG/ML IV EMUL
0.0000 mg/h | INTRAVENOUS | Status: AC
  Administered 2024-08-23 (×2): 2 mg/h via INTRAVENOUS
  Administered 2024-08-23 – 2024-08-24 (×4): 21 mg/h via INTRAVENOUS
  Administered 2024-08-24: 10.5 mg/h via INTRAVENOUS
  Administered 2024-08-24: 20 mg/h via INTRAVENOUS
  Administered 2024-08-24 (×3): 21 mg/h via INTRAVENOUS
  Administered 2024-08-25: 15 mg/h via INTRAVENOUS
  Administered 2024-08-25 (×3): 13 mg/h via INTRAVENOUS
  Administered 2024-08-25: 15 mg/h via INTRAVENOUS
  Administered 2024-08-25: 11 mg/h via INTRAVENOUS
  Administered 2024-08-25: 15 mg/h via INTRAVENOUS
  Administered 2024-08-26 (×2): 7 mg/h via INTRAVENOUS
  Administered 2024-08-27: 5 mg/h via INTRAVENOUS
  Administered 2024-08-27: 13.5 mg/h via INTRAVENOUS
  Administered 2024-08-27: 4 mg/h via INTRAVENOUS
  Administered 2024-08-27: 13.5 mg/h via INTRAVENOUS
  Administered 2024-08-28: 6 mg/h via INTRAVENOUS
  Administered 2024-08-28 (×2): 21 mg/h via INTRAVENOUS
  Administered 2024-08-28: 16 mg/h via INTRAVENOUS
  Administered 2024-08-28 – 2024-08-29 (×5): 21 mg/h via INTRAVENOUS
  Administered 2024-08-29: 17 mg/h via INTRAVENOUS
  Administered 2024-08-29 (×3): 21 mg/h via INTRAVENOUS
  Administered 2024-08-29: 18 mg/h via INTRAVENOUS
  Administered 2024-08-29 (×2): 21 mg/h via INTRAVENOUS
  Administered 2024-08-30: 19 mg/h via INTRAVENOUS
  Administered 2024-08-30: 21 mg/h via INTRAVENOUS
  Administered 2024-08-30: 19 mg/h via INTRAVENOUS
  Administered 2024-08-30: 21 mg/h via INTRAVENOUS
  Administered 2024-08-30: 19 mg/h via INTRAVENOUS
  Administered 2024-08-30 (×2): 21 mg/h via INTRAVENOUS
  Administered 2024-08-30: 19 mg/h via INTRAVENOUS
  Administered 2024-08-30 (×2): 21 mg/h via INTRAVENOUS
  Administered 2024-08-31: 17 mg/h via INTRAVENOUS
  Administered 2024-08-31: 21 mg/h via INTRAVENOUS
  Administered 2024-08-31: 11 mg/h via INTRAVENOUS
  Administered 2024-08-31: 21 mg/h via INTRAVENOUS
  Administered 2024-08-31: 18 mg/h via INTRAVENOUS
  Administered 2024-08-31: 2 mg/h via INTRAVENOUS
  Administered 2024-09-01 (×2): 19 mg/h via INTRAVENOUS
  Administered 2024-09-01: 21 mg/h via INTRAVENOUS
  Administered 2024-09-01: 20 mg/h via INTRAVENOUS
  Administered 2024-09-01 (×2): 19 mg/h via INTRAVENOUS
  Administered 2024-09-01 (×2): 21 mg/h via INTRAVENOUS
  Administered 2024-09-01: 18 mg/h via INTRAVENOUS
  Administered 2024-09-02 – 2024-09-03 (×15): 21 mg/h via INTRAVENOUS
  Filled 2024-08-21 (×5): qty 100
  Filled 2024-08-21: qty 200
  Filled 2024-08-21: qty 100
  Filled 2024-08-21: qty 200
  Filled 2024-08-21 (×4): qty 100
  Filled 2024-08-21: qty 200
  Filled 2024-08-21 (×3): qty 100
  Filled 2024-08-21: qty 200
  Filled 2024-08-21 (×2): qty 100
  Filled 2024-08-21: qty 200
  Filled 2024-08-21: qty 100
  Filled 2024-08-21: qty 50
  Filled 2024-08-21 (×6): qty 100
  Filled 2024-08-21: qty 200
  Filled 2024-08-21 (×9): qty 100
  Filled 2024-08-21: qty 200
  Filled 2024-08-21 (×9): qty 100
  Filled 2024-08-21: qty 200
  Filled 2024-08-21 (×9): qty 100
  Filled 2024-08-21: qty 200
  Filled 2024-08-21 (×11): qty 100
  Filled 2024-08-21: qty 200
  Filled 2024-08-21 (×3): qty 100

## 2024-08-21 MED ORDER — FENTANYL 25 MCG/HR TD PT72
1.0000 | MEDICATED_PATCH | TRANSDERMAL | Status: AC
  Administered 2024-08-21 – 2024-09-02 (×5): 1 via TRANSDERMAL
  Filled 2024-08-21 (×5): qty 1

## 2024-08-21 NOTE — Progress Notes (Signed)
 OT Cancellation Note  Patient Details Name: [name] MRN: [id number] DOB: 08/11/1955   Cancelled Treatment:    Reason Eval/Treat Not Completed: Medical issues which prohibited therapy. Per RN, pt with increasing O2 needs, recommends hold all therapy for the day. Will follow up at a later date.    Taima Rada C, OT  Acute Rehabilitation Services Office [phone] Secure chat preferred   Adrianne GORMAN Savers 08/21/2024, 10:49 AM

## 2024-08-21 NOTE — Progress Notes (Signed)
 VASCULAR LAB    Bilateral lower extremity venous duplex has been performed.  See CV proc for preliminary results.   Chequita Mofield, RVT 08/21/2024, 4:56 PM

## 2024-08-21 NOTE — Progress Notes (Signed)
 PHARMACY - TOTAL PARENTERAL NUTRITION CONSULT NOTE  Indication: Esophageal leak  Patient Measurements: Height: 5' 3 (160 cm) Weight: 78 kg (171 lb 15.3 oz) IBW/kg (Calculated) : 52.4 TPN AdjBW (KG): 55.8 Body mass index is 30.46 kg/m.  Assessment:  3 YOF with NSCLC here for RML lobectomy, resection of esophageal diverticulum, and lymph node dissection on 12/29.  Found to have a small contained leak post-op.   Patient reports an intentional weight loss of 40 lbs over a couple of years when she was diagnosed with DM.  She typically drinks one [place] shake every morning for breakfast and eats one meal a day, which could be pizza, spaghetti, chicken, beef or fish along with vegetables and fruits.  Patient was eating normally until her planned surgery. RD concerned with patient being malnourished. Patient was on TPN 12/31 - 1/12 and was advanced to dysphagia 1 diet. However, she was NPO again 1/14-1/16. On 1/17 IR reported small amount of leak from distal esophageal stent. Patient developed AKI since TPN was held. Pharmacy reconsulted for TPN 1/17.  Nephrology following. Serologies negative. Suspect plateau phase of ATN. No RRT. Good urine output.   Glucose / Insulin : hx DM, A1c 5.9% - BG 150-230s, 40 units SSI/24h, glargine 33 units/24hr per TCTS, resistant SSI Q4H  Electrolytes: Na 132, K 3.7 (40 mEq KCl outside TPN 1/24), CL 95, CO2 22 (1:1 TPN), Phos: 3.7, Mg: 2.3, CoCa: 8.9  Renal: AKI SCr 3.25 up (BL 0.8), BUN 84 up Hepatic: 1/22- LFTs / tbili WNL, albumin  2.8, TG 211 1/19 Intake / Output; MIVF: lasix  80mg  IV 1/24, UOP 2440 mL (1.3 ml/kg/hr), LBM 1/21, chest tube 30 mL Net IO Since Admission: 17,472.83 mL [08/21/24 0717]  GI Imaging:   12/30 esophagram: Positive for contrast extravasation and an esophageal leak in the mid esophagus 1/2 esophagram: Persistent contrast extravasation and esophageal leak, similar to prior 1/6 esophagram: Limited contrast study due to patient aspiration  and severe coughing spell. Study aborted. 1/12 esophagram: Patent esophageal stent. No evidence for extravasation or leak, Delayed esophageal emptying and gastroesophageal reflux leading to stasis of contrast in the esophagus. 1/15 CT: displaced esophageal stent in stomach, no bowel obstruction  1/17 esophagram: small volume esophageal leak at distal stent 1/20 esophagram: no evidence of any leak  GI Surgeries / Procedures:  12/30 right middle lobectomy, resection esophageal diverticulum, lymph node biopsy  1/7 EGD- stent placement  1/9 EGD stent repositioned d/t migration to stomach  1/16 EGD- stent placed at esophageal ulcer, removed gastric stent, normal duoenum  1/19 EGD - stent replaced / repositioned   Central access: PICC 07/28/24 TPN start date: 07/28/24 > 1/12; 1/17 >>   Nutritional Goals:  Goal unconcentrated TPN rate 75 ml/hr to provide 101g AA and 1721 kCal Goal concentrated TPN rate 60 mL/hr to provide 101 g AA and 1743 kCal  RD Estimated Needs Total Energy Estimated Needs: 1700-1900 Total Protein Estimated Needs: 100-115g Total Fluid Estimated Needs: >1.7L/day  Current Nutrition:  TPN 1/17 >> 1/08 CLD - passed swallow 1/09 CLD - patient feels can't swallow well > NPO 1/13 advanced to DYS1 1/14 NPO  1/21 FLD, Glucerna x1 on 1/21, milkshake per patient  1/24 NPO (worsening O2 requirements overnight)  Plan: Continue concentrated TPN at goal 60 mL/hr at 1800 given ongoing diuresis, meeting 100% estimated needs. > follow oral intake for transition to oral diet and off of TPN. Continue TPN per TCTS Electrolytes in TPN: Na 85 mEq/L, K 50 mEq/L (= 72 mEq), Ca  3 mEq/L, Mg 7 mEq/L, Phos 7 mmol/L. Cl:Ac 1:1  X1 KCl 40 mEq given outside TPN, cautiously increasing due to worsening renal fxn Add standard MVI and trace elements to TPN Continue resistant SSI to q4h and adjust as needed Insulin  glargine 20 units QD per TCTS (decreasing to add to TPN) Add insulin  regular 15  units to TPN per confirmation with TCTS > NPO Monitor TPN labs on Mon/Thurs, and PRN  Thank you for allowing pharmacy to be a part of this patients care.  Shelba Collier, PharmD, BCPS Clinical Pharmacist

## 2024-08-21 NOTE — Progress Notes (Signed)
 Subjective: Difficulty with breathing.  Objective: Vital signs in last 24 hours: Temp:  [98.1 F (36.7 C)-100.6 F (38.1 C)] 98.4 F (36.9 C) (01/24 0759) Pulse Rate:  [94-113] 102 (01/24 0800) Resp:  [13-26] 17 (01/24 0800) BP: (106-152)/(45-93) 126/57 (01/24 0800) SpO2:  [81 %-100 %] 100 % (01/24 0826) FiO2 (%):  [100 %] 100 % (01/24 0826) Weight:  [78 kg] 78 kg (01/24 0500) Last BM Date : 08/18/24  Intake/Output from previous day: 01/23 0701 - 01/24 0700 In: 1823.7 [P.O.:420; I.V.:1233.3; IV Piggyback:170.3] Out: 2470 [Urine:2440; Chest Tube:30] Intake/Output this shift: Total I/O In: 274.8 [I.V.:224.8; IV Piggyback:50] Out: 250 [Urine:250]  General appearance: alert and fatigued Resp: left sided rales Cardio: regular rate and rhythm GI: soft, non-tender; bowel sounds normal; no masses,  no organomegaly  Lab Results: Recent Labs    08/19/24 0732 08/20/24 0258 08/21/24 0427  WBC 16.0* 15.4* 17.7*  HGB 8.5* 8.1* 7.6*  HCT 24.7* 24.0* 21.7*  PLT 168 134* 131*   BMET Recent Labs    08/19/24 0654 08/20/24 0258 08/21/24 0549  NA 138 135 132*  K 3.7 3.8 3.7  CL 101 97* 95*  CO2 25 23 22   GLUCOSE 158* 152* 163*  BUN 66* 78* 84*  CREATININE 2.66* 2.98* 3.25*  CALCIUM 8.3* 7.9* 8.3*   LFT Recent Labs    08/19/24 0654  PROT 5.6*  ALBUMIN  2.8*  AST 37  ALT 24  ALKPHOS 47  BILITOT 0.4   PT/INR No results for input(s): LABPROT, INR in the last 72 hours. Hepatitis Panel No results for input(s): HEPBSAG, HCVAB, HEPAIGM, HEPBIGM in the last 72 hours. C-Diff No results for input(s): CDIFFTOX in the last 72 hours. Fecal Lactopherrin No results for input(s): FECLLACTOFRN in the last 72 hours.  Studies/Results: DG CHEST PORT 1 VIEW Result Date: 08/21/2024 EXAM: 1 VIEW(S) XRAY OF THE CHEST 08/21/2024 01:00:00 AM COMPARISON: 08/19/2024 CLINICAL HISTORY: Oxygen desaturation. FINDINGS: LINES, TUBES AND DEVICES: Right upper extremity PICC in  place with tip overlying superior vena cava. Stable esophageal stent. LUNGS AND PLEURA: Bilateral interstitial and airspace opacities, left greater than right, increased compared to prior exam. Small right pleural effusion, unchanged. No pneumothorax. HEART AND MEDIASTINUM: No acute abnormality of the cardiac and mediastinal silhouettes. BONES AND SOFT TISSUES: No acute osseous abnormality. IMPRESSION: 1. Bilateral interstitial and airspace opacities, left greater than right, increased compared to prior exam. 2. Small right pleural effusion, unchanged. Electronically signed by: Morgane Naveau MD 08/21/2024 01:31 AM EST RP Workstation: [id number]   DG CHEST PORT 1 VIEW Result Date: 08/19/2024 CLINICAL DATA:  Pleural effusion EXAM: PORTABLE CHEST 1 VIEW COMPARISON:  08/19/2024, 08/18/2024, 08/17/2024 FINDINGS: Esophageal stent is noted. Stable cardiomediastinal silhouette with trace right effusion, unchanged. Interstitial and ground-glass opacity also without significant change. Postsurgical changes on the right corresponding to history of right middle lobectomy. Pleural calcifications at the right base. Right lower chest drainage catheter, similar in position. Right upper extremity central venous catheter tip overlies the upper SVC. IMPRESSION: 1. No significant change in the appearance of the chest with trace right pleural effusion and stable mild diffuse edema or infiltrates Electronically Signed   By: Luke Bun M.D.   On: 08/19/2024 18:18    Medications: Scheduled:  Chlorhexidine  Gluconate Cloth  6 each Topical Daily   fentaNYL   1 patch Transdermal Q72H   heparin  injection (subcutaneous)  5,000 Units Subcutaneous Q8H   insulin  aspart  0-20 Units Subcutaneous Q4H   insulin  glargine  20 Units Subcutaneous BID  ipratropium-albuterol   3 mL Nebulization Q6H   lidocaine   2 patch Transdermal Q24H   neomycin -bacitracin -polymyxin   Topical BID   nitroGLYCERIN   0.3 mg Transdermal Daily   ondansetron   (ZOFRAN ) IV  4 mg Intravenous Q6H   sodium chloride  flush  10-40 mL Intracatheter Q12H   Continuous:  acetaminophen      clevidipine      famotidine  (PEPCID ) IV     fluconazole  (DIFLUCAN ) IV 400 mg (08/20/24 9077)   piperacillin -tazobactam (ZOSYN )  IV Stopped (08/21/24 9362)   promethazine  (PHENERGAN ) injection (IM or IVPB) Stopped (08/19/24 1554)   TPN ADULT (ION) 60 mL/hr at 08/21/24 0800    Assessment/Plan: 1) Increased work of breathing. 2) S/p esophageal leak with good stent position.   The CXR shows a L>R infiltrate.  This is worsened compared to the CXR on 08/19/2024.  There is the suggestion of aspiration with the CXR appearance.    Plan: 1) Agree with NPO. 2) Maintain TPN. 3) If the stent needs to be pulled sooner, let me know. 4) I will follow peripherally for now.  LOS: 26 days   [name] D 08/21/2024, 9:37 AM

## 2024-08-21 NOTE — Progress Notes (Addendum)
 Night Critical Care and Cross coverage    Interval/subjective  She looks comfortable on 100% nonrebreather  Objective   BP 133/67   Pulse (!) 107   Temp (!) 100.9 F (38.3 C)   Resp 18   Ht 5' 3 (1.6 m)   Wt 78 kg   LMP 10/26/1998 Comment: ~IN 40s  SpO2 100%   BMI 30.46 kg/m    CVP:  [2 mmHg-5 mmHg] 2 mmHg  I/O last 3 completed shifts: In: 3065.8 [P.O.:420; I.V.:2114.9; IV Piggyback:530.9] Out: 4320 [Urine:4270; Chest Tube:50] No intake/output data recorded.   Exam General Resting in bed no acute distress HEENT normocephalic atraumatic currently no JVD mucous membranes moist Pulmonary diffuse rales bilaterally primarily in the posterior region, no accessory use on 100% nonrebreather Chest x-ray (right diffuse pulmonary infiltrates Chest tube output evaluated.  Currently 475 mL out of chest tube since insertion Cardiac regular rate and rhythm Abdomen soft nontender Extremities are warm and dry with brisk cap refill Neuro currently intact  Impression and plan    Acute hypoxic Respiratory failure: multifactorial: asp PNA w/ pneumonitis w/ ALI +/- non-cardiac pulmonary edema  No sig improvement w/ diuretics. Sed rate 122/MRSA PCR neg Suspect this is primarily something that is just going to take time and eventually we will need to decide on steroids or not  Plan Cont supplemental oxygen goal > 88%;  Cont pulse ox   F/u formal ECHO and formal LE US  but (prelim neg for DVT) Abx widened High risk for intubation If intubated may need to consider steroids (would be multi-disciplinary decision w/ thoracic surgery and PCCM  Keep n.p.o. Currently on TPN  Esophageal leak -last note suggest stent in good position  Plan As above   Aki 2/2 ATN  Scr inc'd w diuresis today (early this am). Prob intravasc dry given >50% IVC collapse on POCUS Plan Keep euvolemic Renal dose meds No Lasix  tonight  Strict I&O    I personally  spent 32 minutes  on this patient which  included: review of medical records, nursing notes, progress notes, evaluation, interpretation of lab data and diagnostic studies, taking independent history, performing exam, documenting plan, ordering diagnostics and interventions for the following critical care issues: Acute respiratory failure with the following interventions which included: evaluation of diagnostic studies, review of notes, titration of O2

## 2024-08-21 NOTE — Progress Notes (Signed)
 "  NAME:  [name], MRN:  [id number], DOB:  1956-02-05, LOS: 26 ADMISSION DATE:  07/26/2024, CONSULTATION DATE:  1/15 REFERRING MD:  Kerrin, CHIEF COMPLAINT:  sepsis   History of Present Illness:  [name] is a 69 y/o woman with a history of tobacco abuse and lung adenocarcinoma status post RML lobectomy on 12/29.  At the time of surgery was a large cyst that was resected. Despite oversewing it time resection she later developed an esophageal leak.  She required an esophageal stent which she has had recurrent problems with related to positioning/migration of the stent (necessitating multiple EGDs for stent revision). From 1/15 through 1/18, the patient was consistently total body balance positive in setting of receiving NS @ 100 mL/hr and then starting TPN as of 1/18. Due to concomitant AKI, developed volume overload and pulmonary edema and hypertensive urgency ultimately requiring a Cardene  drip be started overnight 1/19. Also on 1/19, she underwent revision of the position of her esophageal stent to address a small esophageal leak.  Pertinent  Medical History  Lung adenocarcinoma Latent  TB, treated in 1980 HTN HLD DM OSA Tobacco abuse  Significant Hospital Events: Including procedures, antibiotic start and stop dates in addition to other pertinent events   12/29 RML lobectomy 1/7 EGD with esophageal stent 1/9 EGD  with stent reposition after migration to stomach 1/15 vanc added, zosyn  restarted. CT  1/19: EGD for stent repositioning due to ongoing small esophageal leak / stent migration 1/20: Gastrograffin study -> no esophageal leak. Started on clear liquid diet. 1/21: Full liquid diet. Diuresed.  Interim History / Subjective:  Increased oxygen requirements overnight-- on NRB. Per RN desaturates quickly when off NRB.  Objective    Blood pressure (!) 106/58, pulse 100, temperature 98.6 F (37 C), temperature source Axillary, resp. rate 15, height 5' 3 (1.6 m), weight 78  kg, last menstrual period 10/26/1998, SpO2 99%.        Intake/Output Summary (Last 24 hours) at 08/21/2024 0745 Last data filed at 08/21/2024 0710 Gross per 24 hour  Intake 1628.47 ml  Output 2470 ml  Net -841.53 ml   Filed Weights   08/19/24 0700 08/20/24 0500 08/21/24 0500  Weight: 65.9 kg 71.2 kg 78 kg    Examination: General: chronically ill appearing woman lying in bed in NAD HENT:[place]/AT, eyes anicteric Lungs:  breathing comfortably on NRB, rhales L>R. Mild bilateral wheezing. Decreased basilar breath sounds. No air leak from chest tube Cardiovascular: S1S2, tachycardic, reg rhythm Abdomen: soft, NT Extremities: no peripheral edema Neuro: awake, alert, answering questions  Na+ 132 BUN 84 Cr 3.25  I/O balance:  -600cc, net +17L for admission Chest tube 30cc output  Resolved problem list   Assessment and Plan   Esophageal leak- S/p esophageal stenting c/b migration, now s/p repositioning by GI most recently on 1/19 -con't to monitor R chest tube output -back to NPO with worsening respiratory status; keep PICC & TPN at full dose -appreciate GI & TCTS management  Acute respiratory insufficiency with hypoxia, concern for aspiration, but now worsening left lung infiltrate is worrisome for dysphagia or stent related reflux & aspiration -CT chest today -on NRB -strict NPO today -CRP pending -at risk for intubation if she deteriorates further -HR not dramatically worse, but PE remains a risk. Will check LE dopplers & echo. CT contrast too high risk with her renal function right now.  -con't zosyn  and fluonazole  Low grade fevers -look for DVTs -may need to add IV antifungals and  broaden antibiotics; would avoid vanc with renal failure  AKI c/b ATN and hypervolemia> now appears euvolemic on exam and renal function is worsening with diuresis Hyponatremia -strict I/O -received additional lasix  overnight; holding today. Check BNP. -Renally dose meds, avoid nephrotoxic  meds -maintain adequate perfusion -placing foley to facilitate getting to CT scan  Hypertensive urgency -switching antihypertensives back to IV-- cleviprex  today  Lung adenocarcinoma, s/p lobectomy -add back fentanyl  patch, IV tylenol , IV dilaudid  for breakthrough until fentanyl  patch starts working in a few days  H/o tobacco abuse -recommend cessation  Hyperglycemia, not fully controlled -glargine increase to 20 units BID -SSI PRN -increase insulin  in TPN per pharmacy -holding PTA jardiance -goal BG 140-180  Deconditioning -PT, OT as able -mobility will be limited with hypoxia today  Severe protein energy malnutrition -full TPN -NPO  Plan d/w TCTS.    Labs   CBC: Recent Labs  Lab 08/14/24 1325 08/14/24 1923 08/18/24 0742 08/19/24 0536 08/19/24 0732 08/20/24 0258 08/21/24 0427  WBC 10.8*   < > 16.9* 13.3* 16.0* 15.4* 17.7*  NEUTROABS 8.4*  --   --   --   --   --   --   HGB 9.5*   < > 8.1* 7.5* 8.5* 8.1* 7.6*  HCT 27.9*   < > 24.7* 22.5* 24.7* 24.0* 21.7*  MCV 93.3   < > 93.6 97.8 91.1 90.9 95.6  PLT 198   < > 158 129* 168 134* 131*   < > = values in this interval not displayed.    Basic Metabolic Panel: Recent Labs  Lab 08/15/24 0050 08/15/24 1800 08/16/24 0500 08/17/24 0958 08/18/24 0742 08/18/24 1451 08/19/24 0654 08/20/24 0258 08/21/24 0549  NA 142   < > 142 143 141 140 138 135 132*  K 3.6   < > 3.5 3.4* 3.6 4.0 3.7 3.8 3.7  CL 111   < > 107 108 106 104 101 97* 95*  CO2 21*   < > 25 23 24 23 25 23 22   GLUCOSE 246*   < > 122* 226* 159* 152* 158* 152* 163*  BUN 50*   < > 46* 58* 62* 66* 66* 78* 84*  CREATININE 2.55*   < > 2.55* 2.72* 2.58* 2.70* 2.66* 2.98* 3.25*  CALCIUM 7.6*   < > 8.2* 8.1* 7.9* 7.8* 8.3* 7.9* 8.3*  MG 2.0  --  1.7 2.0  --   --  1.5* 2.3  --   PHOS 3.3  --  2.2* 2.1* 2.7  --  3.1 3.7  --    < > = values in this interval not displayed.   GFR: Estimated Creatinine Clearance: 16.8 mL/min (A) (by C-G formula based on SCr of  3.25 mg/dL (H)). Recent Labs  Lab 08/19/24 0536 08/19/24 0732 08/20/24 0258 08/21/24 0427  WBC 13.3* 16.0* 15.4* 17.7*    This patient is critically ill with multiple organ system failure which requires frequent high complexity decision making, assessment, support, evaluation, and titration of therapies. This was completed through the application of advanced monitoring technologies and extensive interpretation of multiple databases. During this encounter critical care time was devoted to patient care services described in this note for 45 minutes.   Leita SHAUNNA Gaskins, DO 08/21/24 9:03 AM [facility]  For contact information, see Amion. If no response to pager, please call PCCM 2H APP. After hours, 7PM- 7AM, please call on call APP for 2H.  "

## 2024-08-21 NOTE — Progress Notes (Signed)
 Patient ID: [name], female   DOB: 1956/04/14, 69 y.o.   MRN: [id number]  TCTS Evening Rounds:  Tmax 101.1 this evening.  Hemodynamically stable  ST 108.  Sats 94% on NRB. Desats quickly when she pulls off oxygen.  RR 20.  Chest CT shows severe bilateral airspace disease worse in the upper lobes and L>R. Minimal pleural fluid.    On Zosyn , Diflucan  and linezolid  started today.  Very likely that she will require intubation at some point. CCM following closely.

## 2024-08-21 NOTE — Progress Notes (Signed)
 Night Critical Care and Cross coverage    Interval/subjective  Called to bedside. Pt's WOB progressively increasing over course of evening.  Now on 10LPM as sats down to low 80s on 6 lpm  Reports more SOB since yesterday Nursing reports not tolerating higher levels than 10 due to pressure    Objective   BP (!) 132/58   Pulse (!) 112   Temp 99.4 F (37.4 C) (Oral)   Resp (!) 22   Ht 5' 3 (1.6 m)   Wt 71.2 kg   LMP 10/26/1998 Comment: ~IN 40s  SpO2 (!) 87%   BMI 27.81 kg/m       I/O last 3 completed shifts: In: 4666.7 [P.O.:2100; I.V.:2142.3; IV Piggyback:424.4] Out: 4560 [Urine:4500; Chest Tube:60] Total I/O In: 115.5 [I.V.:115.5] Out: -    Exam General 69 year old female. She is laying in bed. Currently on 10 LPM. + accessory use  HENT + JVD MMM  Pulm diffuse rales. Currently 10lpm + accessory use  POCUS diffuse B Lines. Hyperdynamic LV fxn. >50% IVC collapse  PCXR significantly worse bilateral airspace disease Card tachy RRR  Abd soft Ext brisk CR  Neuro anxious but intact   Impression and plan    Acute hypoxic Respiratory failure: multifactorial: asp PNA w/ pneumonitis w/ ALI and what looks like non-cardiogenic pulmonary edema  -IVC collapsible but she is still 17 liters + since admit  -cultures evaluated from BAL on 16th. Still NGTD other than NOF -now day 10 zosyn  and difluc Plan Cont supplemental oxygen goal > 88%;  Cont pulse ox   Can try heated high flow if needed  Repeat IV lasix  now (see if we can mobilize some lung water but agree w/ nephro note from am not convinced she is Intravascularly overloaded)  Cont current abx Encourage IS   Esophageal leak -last note suggest stent in good position  Plan As above   Aki 2/2 ATN  -her I&O is positive but does have >50% IVC collapse Plan Strict I&O Lasix  as noted Trend chems  Renal dose meds     I personally  spent 32 minutes  on this patient which included: review of medical records, nursing  notes, progress notes, evaluation, interpretation of lab data and diagnostic studies, taking independent history, performing exam, documenting plan, ordering diagnostics and interventions for the following critical care issues: Acute respiratory failure with the following interventions which included: evaluation of hemodynamics and administration of IV diuretics   Maude FORBES Banner ACNP-BC [facility] Pul[facility]/Critical Care Pager # [phone] OR # [phone] if no answer

## 2024-08-21 NOTE — Progress Notes (Signed)
 Patient ID: [name], female   DOB: 20-Feb-1956, 69 y.o.   MRN: [id number] [facility] Progress Note   Assessment/ Plan:   1. Acute kidney Injury: Suspected to have ATN secondary to ischemic and nephrotoxic etiologies.  Serologies negative for GN including vasculitis/anti-GBM given previous concerns of gross hematuria.  Previously thought to be in the plateau phase of ATN however creatinine rising over the last 2 days likely indicative of hemodynamic impact of ongoing diuresis/volume management.  Continue furosemide  at 40 mg intravenously twice a day; net -0.9 L overnight. 2.  Esophageal leak following resection of esophageal diverticulum: Underwent placement of a new esophageal stent previous one migrated distally into the stomach.  EGD and esophagram without evidence of leak.  3.  Hypertensive urgency: Blood pressures currently under good control following transition from nicardipine  drip. 4. Anemia: related to previous losses including hematuria.  Lower hemoglobin and hematocrit noted overnight with ongoing chest tube drainage.  No indication for PRBC transfusion. 5.  Acute hypoxic respiratory failure: Suspected to be multifactorial with pneumonia versus pneumonitis/acute lung injury and possibly superimposed pulmonary edema.  On broad-spectrum antibiotics with Zosyn /fluconazole  as well as ongoing diuretic.  Worsening leukocytosis noted.  Subjective:   Problems with increasing shortness of breath overnight and low-grade fevers noted.  Received supplemental diuretic dose overnight.   Objective:   BP (!) 106/58   Pulse 100   Temp 98.4 F (36.9 C) (Axillary)   Resp 15   Ht 5' 3 (1.6 m)   Wt 78 kg   LMP 10/26/1998 Comment: ~IN 40s  SpO2 99%   BMI 30.46 kg/m   Intake/Output Summary (Last 24 hours) at 08/21/2024 0806 Last data filed at 08/21/2024 0710 Gross per 24 hour  Intake 1555.97 ml  Output 2470 ml  Net -914.03 ml   Weight change: 6.785 kg  Physical Exam: Gen:  Appears somewhat uncomfortable, on oxygen via facemask CVS: Pulse regular rhythm, normal rate, S1 and S2 normal Resp: Diminished breath sounds with poor inspiratory effort over bases.  Right-sided chest tube in situ Abd: Soft, nontender, bowel sounds normal Ext: No lower extremity edema  Imaging: DG CHEST PORT 1 VIEW Result Date: 08/21/2024 EXAM: 1 VIEW(S) XRAY OF THE CHEST 08/21/2024 01:00:00 AM COMPARISON: 08/19/2024 CLINICAL HISTORY: Oxygen desaturation. FINDINGS: LINES, TUBES AND DEVICES: Right upper extremity PICC in place with tip overlying superior vena cava. Stable esophageal stent. LUNGS AND PLEURA: Bilateral interstitial and airspace opacities, left greater than right, increased compared to prior exam. Small right pleural effusion, unchanged. No pneumothorax. HEART AND MEDIASTINUM: No acute abnormality of the cardiac and mediastinal silhouettes. BONES AND SOFT TISSUES: No acute osseous abnormality. IMPRESSION: 1. Bilateral interstitial and airspace opacities, left greater than right, increased compared to prior exam. 2. Small right pleural effusion, unchanged. Electronically signed by: Morgane Naveau MD 08/21/2024 01:31 AM EST RP Workstation: [id number]   DG CHEST PORT 1 VIEW Result Date: 08/19/2024 CLINICAL DATA:  Pleural effusion EXAM: PORTABLE CHEST 1 VIEW COMPARISON:  08/19/2024, 08/18/2024, 08/17/2024 FINDINGS: Esophageal stent is noted. Stable cardiomediastinal silhouette with trace right effusion, unchanged. Interstitial and ground-glass opacity also without significant change. Postsurgical changes on the right corresponding to history of right middle lobectomy. Pleural calcifications at the right base. Right lower chest drainage catheter, similar in position. Right upper extremity central venous catheter tip overlies the upper SVC. IMPRESSION: 1. No significant change in the appearance of the chest with trace right pleural effusion and stable mild diffuse edema or infiltrates  Electronically Signed   By:  Luke Bun M.D.   On: 08/19/2024 18:18    Labs: BMET Recent Labs  Lab 08/15/24 0050 08/15/24 1800 08/16/24 0500 08/17/24 0958 08/18/24 0742 08/18/24 1451 08/19/24 0654 08/20/24 0258 08/21/24 0549  NA 142   < > 142 143 141 140 138 135 132*  K 3.6   < > 3.5 3.4* 3.6 4.0 3.7 3.8 3.7  CL 111   < > 107 108 106 104 101 97* 95*  CO2 21*   < > 25 23 24 23 25 23 22   GLUCOSE 246*   < > 122* 226* 159* 152* 158* 152* 163*  BUN 50*   < > 46* 58* 62* 66* 66* 78* 84*  CREATININE 2.55*   < > 2.55* 2.72* 2.58* 2.70* 2.66* 2.98* 3.25*  CALCIUM 7.6*   < > 8.2* 8.1* 7.9* 7.8* 8.3* 7.9* 8.3*  PHOS 3.3  --  2.2* 2.1* 2.7  --  3.1 3.7  --    < > = values in this interval not displayed.   CBC Recent Labs  Lab 08/14/24 1325 08/14/24 1923 08/19/24 0536 08/19/24 0732 08/20/24 0258 08/21/24 0427  WBC 10.8*   < > 13.3* 16.0* 15.4* 17.7*  NEUTROABS 8.4*  --   --   --   --   --   HGB 9.5*   < > 7.5* 8.5* 8.1* 7.6*  HCT 27.9*   < > 22.5* 24.7* 24.0* 21.7*  MCV 93.3   < > 97.8 91.1 90.9 95.6  PLT 198   < > 129* 168 134* 131*   < > = values in this interval not displayed.    Medications:     acetaminophen   1,000 mg Oral Q6H   Chlorhexidine  Gluconate Cloth  6 each Topical Daily   docusate sodium   100 mg Oral Daily   famotidine   20 mg Oral Daily   furosemide   40 mg Intravenous BID   heparin  injection (subcutaneous)  5,000 Units Subcutaneous Q8H   hydrALAZINE   25 mg Oral Q8H   HYDROmorphone   4 mg Oral Q6H   insulin  aspart  0-20 Units Subcutaneous Q4H   insulin  glargine  18 Units Subcutaneous BID   ipratropium-albuterol   3 mL Nebulization Q6H   lactose free nutrition  237 mL Oral TID BM   lidocaine   2 patch Transdermal Q24H   metoprolol  tartrate  50 mg Oral BID   neomycin -bacitracin -polymyxin   Topical BID   NIFEdipine   60 mg Oral Daily   nitroGLYCERIN   0.3 mg Transdermal Daily   ondansetron  (ZOFRAN ) IV  4 mg Intravenous Q6H   polyethylene glycol  17 g Oral  Daily   senna  1 tablet Oral Daily   sodium chloride  flush  10-40 mL Intracatheter Q12H    Gordy Blanch, MD 08/21/2024, 8:06 AM

## 2024-08-21 NOTE — Progress Notes (Signed)
 5 Days Post-Op Procedures (LRB): EGD (ESOPHAGOGASTRODUODENOSCOPY) (N/A) CONTROL OF HEMORRHAGE, GI TRACT, ENDOSCOPIC, BY CLIPPING OR OVERSEWING Subjective:  No specific complaints but had increased oxygen requirement overnight. Now on NRB.  Had been taking po yesterday but now made NPO due to increased oxygen requirement and worsening CXR appearance, particularly on left side.  Objective: Vital signs in last 24 hours: Temp:  [98.1 F (36.7 C)-100.6 F (38.1 C)] 98.4 F (36.9 C) (01/24 0759) Pulse Rate:  [94-113] 102 (01/24 0800) Cardiac Rhythm: Sinus tachycardia (01/24 0800) Resp:  [13-26] 17 (01/24 0800) BP: (106-152)/(45-93) 126/57 (01/24 0800) SpO2:  [81 %-100 %] 100 % (01/24 0826) FiO2 (%):  [100 %] 100 % (01/24 0826) Weight:  [78 kg] 78 kg (01/24 0500)  Hemodynamic parameters for last 24 hours: CVP:  [5 mmHg] 5 mmHg  Intake/Output from previous day: 01/23 0701 - 01/24 0700 In: 1823.7 [P.O.:420; I.V.:1233.3; IV Piggyback:170.3] Out: 2470 [Urine:2440; Chest Tube:30] Intake/Output this shift: Total I/O In: 274.8 [I.V.:224.8; IV Piggyback:50] Out: 250 [Urine:250]  General appearance: alert and cooperative Neurologic: intact Heart: regular rate and rhythm Lungs: rales left lung, right lung sounds ok Abdomen: soft, non-tender; bowel sounds normal Extremities: no edema Wound: incisions ok. CT in place with grungy appearing drainage but low volume.  Lab Results: Recent Labs    08/20/24 0258 08/21/24 0427  WBC 15.4* 17.7*  HGB 8.1* 7.6*  HCT 24.0* 21.7*  PLT 134* 131*   BMET:  Recent Labs    08/20/24 0258 08/21/24 0549  NA 135 132*  K 3.8 3.7  CL 97* 95*  CO2 23 22  GLUCOSE 152* 163*  BUN 78* 84*  CREATININE 2.98* 3.25*  CALCIUM 7.9* 8.3*    PT/INR: No results for input(s): LABPROT, INR in the last 72 hours. ABG    Component Value Date/Time   TCO2 25 11/10/2009 0952   CBG (last 3)  Recent Labs    08/20/24 2349 08/21/24 0330 08/21/24 0757   GLUCAP 254* 234* 146*   CXR: bilateral interstitial and airspace opacities L>R that have increased on the left. Small right effusion unchanged. No ptx.  Assessment/Plan: S/P Procedures (LRB): EGD (ESOPHAGOGASTRODUODENOSCOPY) (N/A) CONTROL OF HEMORRHAGE, GI TRACT, ENDOSCOPIC, BY CLIPPING OR OVERSEWING  Hemodynamically stable  Tmax 100.6 yesterday afternoon. Increased oxygen requirement and WOB with CXR showing increased opacity on the left and rales on exam on left. She has a rattling cough. NPO and continue TNA. This may be aspiration due to reflux and dysphagia with esophageal stent in place. Planning CT chest today. Continue antibiotics.  AKI with increasing BUN and creat with lasix . I agree that she is euvolemic and we need to hold off on further diuresis. Her increased oxygen requirement and rales are not due to pulmonary edema.  Keep chest tube in. Planning to convert that to empyema tube eventually.    LOS: 26 days    [name] 08/21/2024

## 2024-08-22 ENCOUNTER — Inpatient Hospital Stay (HOSPITAL_COMMUNITY)

## 2024-08-22 DIAGNOSIS — R9431 Abnormal electrocardiogram [ECG] [EKG]: Secondary | ICD-10-CM | POA: Diagnosis not present

## 2024-08-22 LAB — ECHOCARDIOGRAM COMPLETE
Area-P 1/2: 4.12 cm2
Height: 63 in
S' Lateral: 3 cm
Weight: 2733.7 [oz_av]

## 2024-08-22 LAB — BASIC METABOLIC PANEL WITH GFR
Anion gap: 15 (ref 5–15)
BUN: 91 mg/dL — ABNORMAL HIGH (ref 8–23)
CO2: 22 mmol/L (ref 22–32)
Calcium: 8.2 mg/dL — ABNORMAL LOW (ref 8.9–10.3)
Chloride: 100 mmol/L (ref 98–111)
Creatinine, Ser: 3.51 mg/dL — ABNORMAL HIGH (ref 0.44–1.00)
GFR, Estimated: 14 mL/min — ABNORMAL LOW
Glucose, Bld: 146 mg/dL — ABNORMAL HIGH (ref 70–99)
Potassium: 3.8 mmol/L (ref 3.5–5.1)
Sodium: 137 mmol/L (ref 135–145)

## 2024-08-22 LAB — EXPECTORATED SPUTUM ASSESSMENT W GRAM STAIN, RFLX TO RESP C

## 2024-08-22 LAB — CBC
HCT: 20.1 % — ABNORMAL LOW (ref 36.0–46.0)
Hemoglobin: 6.8 g/dL — CL (ref 12.0–15.0)
MCH: 30.4 pg (ref 26.0–34.0)
MCHC: 33.8 g/dL (ref 30.0–36.0)
MCV: 89.7 fL (ref 80.0–100.0)
Platelets: 138 10*3/uL — ABNORMAL LOW (ref 150–400)
RBC: 2.24 MIL/uL — ABNORMAL LOW (ref 3.87–5.11)
RDW: 15.4 % (ref 11.5–15.5)
WBC: 15.8 10*3/uL — ABNORMAL HIGH (ref 4.0–10.5)
nRBC: 0 % (ref 0.0–0.2)

## 2024-08-22 LAB — GLUCOSE, CAPILLARY
Glucose-Capillary: 146 mg/dL — ABNORMAL HIGH (ref 70–99)
Glucose-Capillary: 141 mg/dL — ABNORMAL HIGH (ref 70–99)
Glucose-Capillary: 177 mg/dL — ABNORMAL HIGH (ref 70–99)
Glucose-Capillary: 169 mg/dL — ABNORMAL HIGH (ref 70–99)
Glucose-Capillary: 144 mg/dL — ABNORMAL HIGH (ref 70–99)
Glucose-Capillary: 150 mg/dL — ABNORMAL HIGH (ref 70–99)

## 2024-08-22 LAB — PREPARE RBC (CROSSMATCH)

## 2024-08-22 MED ORDER — PERFLUTREN LIPID MICROSPHERE
1.0000 mL | INTRAVENOUS | Status: AC | PRN
  Administered 2024-08-22: 3 mL via INTRAVENOUS

## 2024-08-22 MED ORDER — FLUCONAZOLE IN SODIUM CHLORIDE 200-0.9 MG/100ML-% IV SOLN
200.0000 mg | Freq: Once | INTRAVENOUS | Status: AC
  Administered 2024-08-22: 200 mg via INTRAVENOUS
  Filled 2024-08-22: qty 100

## 2024-08-22 MED ORDER — FLUCONAZOLE IN SODIUM CHLORIDE 400-0.9 MG/200ML-% IV SOLN
400.0000 mg | Freq: Every day | INTRAVENOUS | Status: DC
  Administered 2024-08-23: 400 mg via INTRAVENOUS
  Filled 2024-08-22 (×2): qty 200

## 2024-08-22 MED ORDER — SODIUM CHLORIDE 0.9 % IV SOLN
2.0000 g | INTRAVENOUS | Status: DC
  Administered 2024-08-22 – 2024-08-25 (×4): 2 g via INTRAVENOUS
  Filled 2024-08-22 (×4): qty 12.5

## 2024-08-22 MED ORDER — SODIUM CHLORIDE 0.9 % IV SOLN
10.0000 mg | Freq: Every day | INTRAVENOUS | Status: DC
  Administered 2024-08-23 – 2024-08-30 (×8): 10 mg via INTRAVENOUS
  Filled 2024-08-22 (×8): qty 1

## 2024-08-22 MED ORDER — SODIUM CHLORIDE 0.9% IV SOLUTION: Freq: Once | INTRAVENOUS | Status: DC

## 2024-08-22 MED ORDER — METRONIDAZOLE 500 MG/100ML IV SOLN
500.0000 mg | Freq: Two times a day (BID) | INTRAVENOUS | Status: DC
  Administered 2024-08-22 – 2024-08-30 (×17): 500 mg via INTRAVENOUS
  Filled 2024-08-22 (×17): qty 100

## 2024-08-22 MED ORDER — ACETAMINOPHEN 10 MG/ML IV SOLN
1000.0000 mg | Freq: Three times a day (TID) | INTRAVENOUS | Status: AC | PRN
  Administered 2024-08-22: 1000 mg via INTRAVENOUS
  Filled 2024-08-22: qty 100

## 2024-08-22 MED ORDER — HYDROMORPHONE HCL 1 MG/ML IJ SOLN
1.0000 mg | INTRAMUSCULAR | Status: AC | PRN
  Administered 2024-08-22 – 2024-08-31 (×13): 1 mg via INTRAVENOUS
  Administered 2024-08-31: 2 mg via INTRAVENOUS
  Administered 2024-08-31 (×2): 1 mg via INTRAVENOUS
  Administered 2024-09-01 (×2): 2 mg via INTRAVENOUS
  Administered 2024-09-01 – 2024-09-02 (×4): 1 mg via INTRAVENOUS
  Administered 2024-09-02: 2 mg via INTRAVENOUS
  Administered 2024-09-03 (×3): 1 mg via INTRAVENOUS
  Filled 2024-08-22: qty 2
  Filled 2024-08-22 (×20): qty 1
  Filled 2024-08-22: qty 2
  Filled 2024-08-22 (×5): qty 1
  Filled 2024-08-22: qty 2
  Filled 2024-08-22 (×2): qty 1

## 2024-08-22 MED ORDER — TRACE MINERALS CU-MN-SE-ZN 300-55-60-3000 MCG/ML IV SOLN
INTRAVENOUS | Status: AC
  Filled 2024-08-22: qty 672

## 2024-08-22 NOTE — Progress Notes (Signed)
 PHARMACY - TOTAL PARENTERAL NUTRITION CONSULT NOTE  Indication: Esophageal leak  Patient Measurements: Height: 5' 3 (160 cm) Weight: 77.5 kg (170 lb 13.7 oz) IBW/kg (Calculated) : 52.4 TPN AdjBW (KG): 55.8 Body mass index is 30.27 kg/m.  Assessment:  28 YOF with NSCLC here for RML lobectomy, resection of esophageal diverticulum, and lymph node dissection on 12/29.  Found to have a small contained leak post-op.   Patient reports an intentional weight loss of 40 lbs over a couple of years when she was diagnosed with DM.  She typically drinks one Or[employer] shake every morning for breakfast and eats one meal a day, which could be pizza, spaghetti, chicken, beef or fish along with vegetables and fruits.  Patient was eating normally until her planned surgery. RD concerned with patient being malnourished. Patient was on TPN 12/31 - 1/12 and was advanced to dysphagia 1 diet. However, she was NPO again 1/14-1/16. On 1/17 IR reported small amount of leak from distal esophageal stent. Patient developed AKI since TPN was held. Pharmacy reconsulted for TPN 1/17.  Nephrology following. Serologies negative. Suspect plateau phase of ATN. No RRT. Good urine output.   Glucose / Insulin : hx DM, A1c 5.9% - BG 130-170s improved, 21 units SSI/24h, glargine 20 units/24hr per TCTS, 15 units insulin  added to TPN Electrolytes: Na 137, K 3.8 (40 mEq KCl outside TPN 1/24), CL 100, CO2 22 (1:1 TPN), Phos: 4.3, Mg: 2.3, CoCa: 8.9  Renal: AKI - SCr 3.51 up (BL 0.8), BUN 91 up Hepatic: 1/22- LFTs / tbili WNL, albumin  2.8, TG 211 1/19 Intake / Output; MIVF: lasix  80mg  IV 1/24, UOP 2830 mL (1.5 ml/kg/hr), LBM 1/21, chest tube 20 mL Net IO Since Admission: 17,269.27 mL [08/22/24 0724]  GI Imaging:   12/30 esophagram: Positive for contrast extravasation and an esophageal leak in the mid esophagus 1/2 esophagram: Persistent contrast extravasation and esophageal leak, similar to prior 1/6 esophagram: Limited contrast study due  to patient aspiration and severe coughing spell. Study aborted. 1/12 esophagram: Patent esophageal stent. No evidence for extravasation or leak, Delayed esophageal emptying and gastroesophageal reflux leading to stasis of contrast in the esophagus. 1/15 CT: displaced esophageal stent in stomach, no bowel obstruction  1/17 esophagram: small volume esophageal leak at distal stent 1/20 esophagram: no evidence of any leak  GI Surgeries / Procedures:  12/30 right middle lobectomy, resection esophageal diverticulum, lymph node biopsy  1/7 EGD- stent placement  1/9 EGD stent repositioned d/t migration to stomach  1/16 EGD- stent placed at esophageal ulcer, removed gastric stent, normal duoenum  1/19 EGD - stent replaced / repositioned   Central access: PICC 07/28/24 TPN start date: 07/28/24 > 1/12; 1/17 >>   Nutritional Goals:  Goal unconcentrated TPN rate 75 ml/hr to provide 101g AA and 1721 kCal Goal concentrated TPN rate 60 mL/hr to provide 101 g AA and 1743 kCal  RD Estimated Needs Total Energy Estimated Needs: 1700-1900 Total Protein Estimated Needs: 100-115g Total Fluid Estimated Needs: >1.7L/day  Current Nutrition:  TPN 1/17 >> 1/08 CLD - passed swallow 1/09 CLD - patient feels can't swallow well > NPO 1/13 advanced to DYS1 1/14 NPO  1/21 FLD, Glucerna x1 on 1/21, milkshake per patient  1/24 NPO (worsening O2 requirements overnight)  Plan: Continue concentrated TPN at goal 60 mL/hr at 1800 given ongoing diuresis, meeting 100% estimated needs. > follow oral intake for transition to oral diet and off of TPN. Continue TPN per TCTS Electrolytes in TPN: Na 85 mEq/L, K 50 mEq/L (=  72 mEq), Ca 3 mEq/L, Mg 7 mEq/L, Phos 7 mmol/L. Cl:Ac 1:1  Cautiously increasing K due to worsening renal fxn  Add standard MVI and trace elements to TPN Continue resistant SSI to q4h and adjust as needed Insulin  glargine 20 units QD per TCTS (decreasing to add to TPN) Add insulin  regular 20 units to TPN  per confirmation with TCTS > NPO Monitor TPN labs on Mon/Thurs, and PRN  Thank you for allowing pharmacy to be a part of this patients care.  Shelba Collier, PharmD, BCPS Clinical Pharmacist

## 2024-08-22 NOTE — Progress Notes (Signed)
 Appears comfortable on NRB Plan  As outlined.  Remains high risk for intubation Keep NPO Ok for narc when up in chair position

## 2024-08-22 NOTE — Progress Notes (Signed)
 "  NAME:  [name], MRN:  [id number], DOB:  10-06-1955, LOS: 27 ADMISSION DATE:  07/26/2024, CONSULTATION DATE:  1/15 REFERRING MD:  Kerrin, CHIEF COMPLAINT:  sepsis   History of Present Illness:  [name] is a 69 y/o woman with a history of tobacco abuse and lung adenocarcinoma status post RML lobectomy on 12/29.  At the time of surgery was a large cyst that was resected. Despite oversewing it time resection she later developed an esophageal leak.  She required an esophageal stent which she has had recurrent problems with related to positioning/migration of the stent (necessitating multiple EGDs for stent revision). From 1/15 through 1/18, the patient was consistently total body balance positive in setting of receiving NS @ 100 mL/hr and then starting TPN as of 1/18. Due to concomitant AKI, developed volume overload and pulmonary edema and hypertensive urgency ultimately requiring a Cardene  drip be started overnight 1/19. Also on 1/19, she underwent revision of the position of her esophageal stent to address a small esophageal leak.  Pertinent  Medical History  Lung adenocarcinoma Latent  TB, treated in 1980 HTN HLD DM OSA Tobacco abuse  Significant Hospital Events: Including procedures, antibiotic start and stop dates in addition to other pertinent events   12/29 RML lobectomy 1/7 EGD with esophageal stent 1/9 EGD  with stent reposition after migration to stomach 1/15 vanc added, zosyn  restarted. CT  1/19: EGD for stent repositioning due to ongoing small esophageal leak / stent migration 1/20: Gastrograffin study -> no esophageal leak. Started on clear liquid diet. 1/21: Full liquid diet. Diuresed.  Interim History / Subjective:  Overnight no acute events. Sleeping this morning.   Objective    Blood pressure (!) 140/65, pulse (!) 105, temperature (!) 100.8 F (38.2 C), temperature source Bladder, resp. rate 17, height 5' 3 (1.6 m), weight 77.5 kg, last menstrual period  10/26/1998, SpO2 99%. CVP:  [2 mmHg-5 mmHg] 2 mmHg  FiO2 (%):  [100 %] 100 %   Intake/Output Summary (Last 24 hours) at 08/22/2024 0713 Last data filed at 08/22/2024 9350 Gross per 24 hour  Intake 2646.44 ml  Output 2850 ml  Net -203.56 ml   Filed Weights   08/20/24 0500 08/21/24 0500 08/22/24 0500  Weight: 71.2 kg 78 kg 77.5 kg    Examination: General: chronically ill appearing woman lying in bed sleeping HENT: [place]/AT  Lungs:  breathing comfortably on NRB-- half hanging off. Rhales bilaterally.  No accessory muscle use, mild tachypnea. Cardiovascular: S1S2, RRR Abdomen: soft, NT Extremities: no perihperal edema  Na+ 137 BUN 91 Cr 3.51 WBC 15.8 H/H 6.8/20.1 Platelets 138 LE US : no DVTs  I/O balance:  -600cc, net +17L for admission Chest tube 30cc output  Resolved problem list   Assessment and Plan   Esophageal leak- S/p esophageal stenting c/b migration, now s/p repositioning by GI most recently on 1/19 -con't to monitor R chest tube output- no air leak, minimal fluid out -NPO due to concern for aspiration causing worsening respiratory failure, full dose TPN -appreciate management by GI & TCTS  Acute respiratory insufficiency with hypoxia, concern for aspiration, but now worsening left lung infiltrate is worrisome for dysphagia or stent related reflux & aspiration. Another possibility is inflammation from another source- potentially infecious.  -holding on steroids due to need for esophageal leak to heal. -on NRB ; could be weaned down to venti -strict NPO -CRP -if she gets intubated, would bronch for cultures -remains at high risk for intubation, but WOB has been  appropriate -low suspicion for PE-- the degree of airspace disease she has explains the severity of her hypoxia.  -con't broad antibiotics-- linezolid , changed zosyn  to cefepime  & flagyl .  Low grade fevers -ruled out DVT on 1/24 -con't broad antibiotics; broadened to linezolid  1/24, change zosyn  to  cefepime  + flagyl  -con't fluconazole ; low threshold to broaden antifungal coverage empirically -blood cultures, including fungal since she has been TPN dependent much of this admission  AKI c/b ATN and hypervolemia> now appears euvolemic on exam and renal function is worsening with diuresis Hyponatremia Suspect she is euvolemic to hypovolemic on exam.  -strict I/O -check CVP -holding lasix  for now -renally dose meds, avoid nephrotoxic meds -keep foley for now with renal failure  Hypertensive urgency -back on IV antihypertensives-- cleviprex    Lung adenocarcinoma, s/p lobectomy -back on fentanyl  patch; has dilaudid  for breakthrough until fentanyl  absorption is normal -acetaminophen  for mild pain -con't chest tube per TCTS  H/o tobacco abuse -needs to quit entirely  Hyperglycemia, not fully controlled -con't glargine 20 units BID -SSI PRN -insulin  for TPN  -goal BG 140-180 -jardiance held  Deconditioning -PT, OT -going to need CIR  -mobility somewhat restricted with hypoxia, but ok to mobilize as able  Severe protein energy malnutrition -TPN -full NPO for now  Plan d/w TCTS.    Labs   CBC: Recent Labs  Lab 08/19/24 0536 08/19/24 0732 08/20/24 0258 08/21/24 0427 08/22/24 0222  WBC 13.3* 16.0* 15.4* 17.7* 15.8*  HGB 7.5* 8.5* 8.1* 7.6* 6.8*  HCT 22.5* 24.7* 24.0* 21.7* 20.1*  MCV 97.8 91.1 90.9 95.6 89.7  PLT 129* 168 134* 131* 138*    Basic Metabolic Panel: Recent Labs  Lab 08/16/24 0500 08/17/24 0958 08/18/24 0742 08/18/24 1451 08/19/24 0654 08/20/24 0258 08/21/24 0549 08/21/24 0700 08/22/24 0222  NA 142 143 141 140 138 135 132*  --  137  K 3.5 3.4* 3.6 4.0 3.7 3.8 3.7  --  3.8  CL 107 108 106 104 101 97* 95*  --  100  CO2 25 23 24 23 25 23 22   --  22  GLUCOSE 122* 226* 159* 152* 158* 152* 163*  --  146*  BUN 46* 58* 62* 66* 66* 78* 84*  --  91*  CREATININE 2.55* 2.72* 2.58* 2.70* 2.66* 2.98* 3.25*  --  3.51*  CALCIUM 8.2* 8.1* 7.9* 7.8*  8.3* 7.9* 8.3*  --  8.2*  MG 1.7 2.0  --   --  1.5* 2.3  --   --   --   PHOS 2.2* 2.1* 2.7  --  3.1 3.7  --  4.3  --    GFR: Estimated Creatinine Clearance: 15.5 mL/min (A) (by C-G formula based on SCr of 3.51 mg/dL (H)). Recent Labs  Lab 08/19/24 0732 08/20/24 0258 08/21/24 0427 08/22/24 0222  WBC 16.0* 15.4* 17.7* 15.8*    This patient is critically ill with multiple organ system failure which requires frequent high complexity decision making, assessment, support, evaluation, and titration of therapies. This was completed through the application of advanced monitoring technologies and extensive interpretation of multiple databases. During this encounter critical care time was devoted to patient care services described in this note for 41 minutes.   Leita SHAUNNA Gaskins, DO 08/22/24 9:39 AM  Pulmonary & Critical Care  For contact information, see Amion. If no response to pager, please call PCCM 2H APP. After hours, 7PM- 7AM, please call on call APP for 2H.  "

## 2024-08-22 NOTE — Progress Notes (Signed)
 Hgb trending down 8.1->7.6 and now 6.8 No new evidence of active bleeding.  No change in hemodynamics Plan Will xfuse 1 unit

## 2024-08-22 NOTE — Progress Notes (Signed)
 6 Days Post-Op Procedures (LRB): EGD (ESOPHAGOGASTRODUODENOSCOPY) (N/A) CONTROL OF HEMORRHAGE, GI TRACT, ENDOSCOPIC, BY CLIPPING OR OVERSEWING Subjective:  No specific complaints. Denies shortness of breath. Remains on NRB 15L.  Temp 101 most of the day yesterday. 100 this am.  +100 cc yesterday. UO has been good. 2830 cc yesterday without diuretic. No chest tube output.  Objective: Vital signs in last 24 hours: Temp:  [100 F (37.8 C)-101.3 F (38.5 C)] 100.4 F (38 C) (01/25 0800) Pulse Rate:  [101-118] 107 (01/25 0800) Cardiac Rhythm: Sinus tachycardia (01/25 0800) Resp:  [14-25] 17 (01/25 0800) BP: (121-154)/(52-67) 146/64 (01/25 0800) SpO2:  [82 %-100 %] 95 % (01/25 0800) FiO2 (%):  [100 %] 100 % (01/25 0221) Weight:  [77.5 kg] 77.5 kg (01/25 0500)  Hemodynamic parameters for last 24 hours: CVP:  [2 mmHg-76 mmHg] 3 mmHg  Intake/Output from previous day: 01/24 0701 - 01/25 0700 In: 2945.9 [I.V.:1648.5; Blood:315; IV Piggyback:982.5] Out: 2850 [Urine:2830; Chest Tube:20] Intake/Output this shift: Total I/O In: 59.9 [I.V.:59.9] Out: 65 [Urine:65]  General appearance: chronically ill appearing but awake and appropriate for me. WOB seems stable from yesterday. Neurologic: intact Heart: regular rate and rhythm, no murmur Lungs: rales bilaterally Extremities: no edema Wound: chest incisions ok  Lab Results: Recent Labs    08/21/24 0427 08/22/24 0222  WBC 17.7* 15.8*  HGB 7.6* 6.8*  HCT 21.7* 20.1*  PLT 131* 138*   BMET:  Recent Labs    08/21/24 0549 08/22/24 0222  NA 132* 137  K 3.7 3.8  CL 95* 100  CO2 22 22  GLUCOSE 163* 146*  BUN 84* 91*  CREATININE 3.25* 3.51*  CALCIUM 8.3* 8.2*    PT/INR: No results for input(s): LABPROT, INR in the last 72 hours. ABG    Component Value Date/Time   TCO2 25 11/10/2009 0952   CBG (last 3)  Recent Labs    08/21/24 2329 08/22/24 0340 08/22/24 0757  GLUCAP 173* 146* 141*    Assessment/Plan: S/P  Procedures (LRB): EGD (ESOPHAGOGASTRODUODENOSCOPY) (N/A) CONTROL OF HEMORRHAGE, GI TRACT, ENDOSCOPIC, BY CLIPPING OR OVERSEWING  Hemodynamically stable  Fever and bilateral airspace disease on chest CT consistent with pneumonia. Blood cultures sent. Antibiotics broadened to Maxipime , linezolid , Flagyl  and Diflucan .   Work of breathing and sats seem stable but high risk of needing intubation if this progresses. CCM following closely.  Anemia: Transfusing 1 unit this am.  AKI: making good urine but BUN and creat continue to rise. I=O. Nephrology following.  NPO/TPN for nutrition.   LOS: 27 days    [name] 08/22/2024

## 2024-08-22 NOTE — Progress Notes (Signed)
 TCTS Evening Rounds:  Tmax 100.6 today.  Hemodynamics stable.  Remains on NRB with sats 98-100. WOB stable.  She was up to chair today.  Good UO without diuretics.

## 2024-08-22 NOTE — Progress Notes (Signed)
 Patient ID: [name], female   DOB: [birth year], 69 y.o.   MRN: [id number] [facility] Progress Note   Assessment/ Plan:   1. Acute kidney Injury: Suspected to have ATN secondary to ischemic and nephrotoxic etiologies.  Serologies negative for GN including vasculitis/anti-GBM given previous concerns of gross hematuria.  Likely ATN with continued worsening of renal function in the setting of need for diuresis for volume unloading.  No acute indication for renal replacement therapy at this time given continued response to diuretics. 2.  Esophageal leak following resection of esophageal diverticulum: Underwent placement of a new esophageal stent previous one migrated distally into the stomach.  EGD and esophagram without evidence of leak.  3.  Hypertensive urgency: Blood pressures currently under good control following transition from nicardipine  drip. 4. Anemia: related to previous losses including hematuria.  Lower hemoglobin and hematocrit noted overnight with ongoing chest tube drainage.  1 unit PRBC transfusion ordered by CCM. 5.  Acute hypoxic respiratory failure: Suspected to be multifactorial with pneumonia versus pneumonitis/acute lung injury and possibly superimposed pulmonary edema.  On broad-spectrum antibiotics with Zosyn /fluconazole /linezolid  as well as ongoing diuretic.  Slight improvement of leukocytosis but appears to be fatigued and risk for intubation.  Subjective:   Low hemoglobin noted this morning prompting PRBC transfusion   Objective:   BP (!) 153/63   Pulse (!) 114   Temp 100.2 F (37.9 C) (Bladder)   Resp 18   Ht 5' 3 (1.6 m)   Wt 77.5 kg   LMP 10/26/1998 Comment: ~IN 40s  SpO2 100%   BMI 30.27 kg/m   Intake/Output Summary (Last 24 hours) at 08/22/2024 0641 Last data filed at 08/22/2024 9376 Gross per 24 hour  Intake 2506.25 ml  Output 2850 ml  Net -343.75 ml   Weight change: -0.5 kg  Physical Exam: Gen: Resting comfortably on oxygen via  facemask CVS: Pulse regular rhythm, normal rate, S1 and S2 normal Resp: Diminished breath sounds with poor inspiratory effort over bases.  Right-sided chest tube in situ Abd: Soft, nontender, bowel sounds normal Ext: No lower extremity edema  Imaging: VAS US  LOWER EXTREMITY VENOUS (DVT) Result Date: 08/21/2024  Lower Venous DVT Study Patient Name:  [name]  Date of Exam:   08/21/2024 Medical Rec #: [id number]    Accession #:    [id number] Date of Birth: [birth year]    Patient Gender: F Patient Age:   59 years Exam Location:  [facility] Procedure:      VAS US  LOWER EXTREMITY VENOUS (DVT) Referring Phys: LEITA GASKINS --------------------------------------------------------------------------------  Indications: Acute respiratory distress.  Limitations: Altered mental status. Comparison Study: No prior LEV on file Performing Technologist: Alberta Lis RVS  Examination Guidelines: A complete evaluation includes B-mode imaging, spectral Doppler, color Doppler, and power Doppler as needed of all accessible portions of each vessel. Bilateral testing is considered an integral part of a complete examination. Limited examinations for reoccurring indications may be performed as noted. The reflux portion of the exam is performed with the patient in reverse Trendelenburg.  +---------+---------------+---------+-----------+----------+--------------+ RIGHT    CompressibilityPhasicitySpontaneityPropertiesThrombus Aging +---------+---------------+---------+-----------+----------+--------------+ CFV      Full           Yes      Yes                                 +---------+---------------+---------+-----------+----------+--------------+ SFJ      Full                                                        +---------+---------------+---------+-----------+----------+--------------+  FV Prox  Full                                                         +---------+---------------+---------+-----------+----------+--------------+ FV Mid   Full                                                        +---------+---------------+---------+-----------+----------+--------------+ FV DistalFull                                                        +---------+---------------+---------+-----------+----------+--------------+ PFV      Full                                                        +---------+---------------+---------+-----------+----------+--------------+ POP      Full           Yes      Yes                                 +---------+---------------+---------+-----------+----------+--------------+ PTV      Full                                                        +---------+---------------+---------+-----------+----------+--------------+ PERO     Full                                                        +---------+---------------+---------+-----------+----------+--------------+   +---------+---------------+---------+-----------+----------+--------------+ LEFT     CompressibilityPhasicitySpontaneityPropertiesThrombus Aging +---------+---------------+---------+-----------+----------+--------------+ CFV      Full           Yes      Yes                                 +---------+---------------+---------+-----------+----------+--------------+ SFJ      Full                                                        +---------+---------------+---------+-----------+----------+--------------+ FV Prox  Full                                                        +---------+---------------+---------+-----------+----------+--------------+  FV Mid   Full                                                        +---------+---------------+---------+-----------+----------+--------------+ FV DistalFull                                                         +---------+---------------+---------+-----------+----------+--------------+ PFV      Full                                                        +---------+---------------+---------+-----------+----------+--------------+ POP      Full           Yes      Yes                                 +---------+---------------+---------+-----------+----------+--------------+ PTV      Full                                                        +---------+---------------+---------+-----------+----------+--------------+ PERO     Full                                                        +---------+---------------+---------+-----------+----------+--------------+     Summary: RIGHT: - There is no evidence of deep vein thrombosis in the lower extremity.  - No cystic structure found in the popliteal fossa.  LEFT: - There is no evidence of deep vein thrombosis in the lower extremity.  - No cystic structure found in the popliteal fossa.  *See table(s) above for measurements and observations.    Preliminary    DG CHEST PORT 1 VIEW Result Date: 08/21/2024 EXAM: 1 VIEW XRAY OF THE CHEST 08/21/2024 06:06:04 AM COMPARISON: 08/21/2024 CLINICAL HISTORY: 69 year old female. Pleural effusion, right middle lobectomy, esophageal surgery and stent. FINDINGS: LINES, TUBES AND DEVICES: Esophageal stent stable in position. Right PICC line stable in position. LUNGS AND PLEURA: Bilateral airspace opacities, left greater than right, unchanged. Small right pleural effusion. No pneumothorax. HEART AND MEDIASTINUM: No acute abnormality of the cardiac and mediastinal silhouettes. BONES AND SOFT TISSUES: No acute osseous abnormality. IMPRESSION: 1. Stable bilateral airspace opacities, left greater than right. Small pleural effusion(s), unchanged. 2. Stable lines and tubes. Electronically signed by: Helayne Hurst MD 08/21/2024 11:03 AM EST RP Workstation: [id number]   CT CHEST WO CONTRAST Result Date: 08/21/2024 EXAM: CT CHEST  WITHOUT CONTRAST 08/21/2024 09:47:29 AM TECHNIQUE: CT of the chest was performed without the administration of intravenous contrast. Multiplanar reformatted images are provided for review. Automated exposure control, iterative reconstruction, and/or weight based adjustment of  the mA/kV was utilized to reduce the radiation dose to as low as reasonably achievable. COMPARISON: CT chest, abdomen, and pelvis 08/12/2024. CLINICAL HISTORY: 69 year old female with hypoxia, worsening left lung infiltrate. Midthoracic esophageal diverticulum resection last month with persistent esophageal perforation, stent placement. Right middle lobectomy. Progressive lung opacification. FINDINGS: MEDIASTINUM: Heart and pericardium: No cardiomegaly or pericardial effusion. Calcified atherosclerosis of the aorta. The central airways are clear. Esophagus: Metal esophageal stent in place from the level of the thoracic inlet to the distal third of the esophagus, gas filled. Distal esophagus nondilated but does appear mildly thickened. LYMPH NODES: No mediastinal, hilar or axillary lymphadenopathy. LUNGS AND PLEURA: Postoperative changes at the right hilum and lung compatible with right middle lobectomy , stable. Right chest tube in place, located along the posterior and medial mediastinum. New from prior CT: Widespread bilateral upper lobes predominant crazy paving pattern of pulmonary opacity with both septal thickening and extensive ground glass (series 5 image 47). Superimposed small layering left pleural effusion. Trace right pleural effusion. No pneumothorax. Left lower lobe predominant but also scattered amongst the other pulmonary opacity: Peripheral solid, confluent peribronchial opacity although no air bronchograms. SOFT TISSUES/BONES: Stable occasional mild thoracic superior endplate compression (such as at T3 and T4). Underlying osteopenia. No acute abnormality of the soft tissues. UPPER ABDOMEN: Limited images of the upper abdomen  demonstrates dilute oral contrast in normal appearing transverse colon. Negative visible other non-contrast upper abdominal viscera. IMPRESSION: 1. Widespread bilateral upper lobe predominant lung crazy paving opacity. Superimposed left lower lobe predominant solid and confluent peribronchial opacity. Small layering pleural effusions left greater than right. And right chest tube in place with no adverse features. Top differential considerations include edema, ARDS, drug reaction, infection (mycoplasma, COVID, other viral / atypical etiologies, less likely aspiration). 2. Metal esophageal stent with no adverse features. Electronically signed by: Helayne Hurst MD 08/21/2024 10:56 AM EST RP Workstation: [id number]   DG CHEST PORT 1 VIEW Result Date: 08/21/2024 EXAM: 1 VIEW(S) XRAY OF THE CHEST 08/21/2024 01:00:00 AM COMPARISON: 08/19/2024 CLINICAL HISTORY: Oxygen desaturation. FINDINGS: LINES, TUBES AND DEVICES: Right upper extremity PICC in place with tip overlying superior vena cava. Stable esophageal stent. LUNGS AND PLEURA: Bilateral interstitial and airspace opacities, left greater than right, increased compared to prior exam. Small right pleural effusion, unchanged. No pneumothorax. HEART AND MEDIASTINUM: No acute abnormality of the cardiac and mediastinal silhouettes. BONES AND SOFT TISSUES: No acute osseous abnormality. IMPRESSION: 1. Bilateral interstitial and airspace opacities, left greater than right, increased compared to prior exam. 2. Small right pleural effusion, unchanged. Electronically signed by: Kate Plummer MD 08/21/2024 01:31 AM EST RP Workstation: [id number]    Labs: BMET Recent Labs  Lab 08/16/24 0500 08/17/24 9041 08/18/24 9257 08/18/24 1451 08/19/24 0654 08/20/24 0258 08/21/24 0549 08/21/24 0700 08/22/24 0222  NA 142 143 141 140 138 135 132*  --  137  K 3.5 3.4* 3.6 4.0 3.7 3.8 3.7  --  3.8  CL 107 108 106 104 101 97* 95*  --  100  CO2 25 23 24 23 25 23 22   --  22   GLUCOSE 122* 226* 159* 152* 158* 152* 163*  --  146*  BUN 46* 58* 62* 66* 66* 78* 84*  --  91*  CREATININE 2.55* 2.72* 2.58* 2.70* 2.66* 2.98* 3.25*  --  3.51*  CALCIUM 8.2* 8.1* 7.9* 7.8* 8.3* 7.9* 8.3*  --  8.2*  PHOS 2.2* 2.1* 2.7  --  3.1 3.7  --  4.3  --  CBC Recent Labs  Lab 08/19/24 0732 08/20/24 0258 08/21/24 0427 08/22/24 0222  WBC 16.0* 15.4* 17.7* 15.8*  HGB 8.5* 8.1* 7.6* 6.8*  HCT 24.7* 24.0* 21.7* 20.1*  MCV 91.1 90.9 95.6 89.7  PLT 168 134* 131* 138*    Medications:     sodium chloride    Intravenous Once   Chlorhexidine  Gluconate Cloth  6 each Topical Daily   fentaNYL   1 patch Transdermal Q72H   heparin  injection (subcutaneous)  5,000 Units Subcutaneous Q8H   insulin  aspart  0-20 Units Subcutaneous Q4H   insulin  glargine  20 Units Subcutaneous Daily   ipratropium-albuterol   3 mL Nebulization Q6H   lidocaine   2 patch Transdermal Q24H   neomycin -bacitracin -polymyxin   Topical BID   nitroGLYCERIN   0.3 mg Transdermal Daily   ondansetron  (ZOFRAN ) IV  4 mg Intravenous Q6H   sodium chloride  flush  10-40 mL Intracatheter Q12H    Gordy Blanch, MD 08/22/2024, 6:41 AM

## 2024-08-22 NOTE — Progress Notes (Signed)
 Echocardiogram 2D Echocardiogram has been performed.  [name] 08/22/2024, 12:00 PM

## 2024-08-22 NOTE — Progress Notes (Signed)
 RT attempted to wean patient from 100% NRB to VM. Patient did not tolerate well. 02 Sats dropped to low 80's. NRB placed. RN aware. RT will continue to monitor.

## 2024-08-23 ENCOUNTER — Inpatient Hospital Stay (HOSPITAL_COMMUNITY)

## 2024-08-23 LAB — POCT I-STAT 7, (LYTES, BLD GAS, ICA,H+H)
Acid-base deficit: 5 mmol/L — ABNORMAL HIGH (ref 0.0–2.0)
Bicarbonate: 19.8 mmol/L — ABNORMAL LOW (ref 20.0–28.0)
Calcium, Ion: 1.17 mmol/L (ref 1.15–1.40)
HCT: 30 % — ABNORMAL LOW (ref 36.0–46.0)
Hemoglobin: 10.2 g/dL — ABNORMAL LOW (ref 12.0–15.0)
O2 Saturation: 95 %
Patient temperature: 99.5
Potassium: 3.6 mmol/L (ref 3.5–5.1)
Sodium: 142 mmol/L (ref 135–145)
TCO2: 21 mmol/L — ABNORMAL LOW (ref 22–32)
pCO2 arterial: 35.3 mmHg (ref 32–48)
pH, Arterial: 7.36 (ref 7.35–7.45)
pO2, Arterial: 79 mmHg — ABNORMAL LOW (ref 83–108)

## 2024-08-23 LAB — TYPE AND SCREEN
ABO/RH(D): A POS
Antibody Screen: NEGATIVE
Unit division: 0

## 2024-08-23 LAB — CBC
HCT: 25 % — ABNORMAL LOW (ref 36.0–46.0)
Hemoglobin: 8.5 g/dL — ABNORMAL LOW (ref 12.0–15.0)
MCH: 30.6 pg (ref 26.0–34.0)
MCHC: 34 g/dL (ref 30.0–36.0)
MCV: 89.9 fL (ref 80.0–100.0)
Platelets: 180 10*3/uL (ref 150–400)
RBC: 2.78 MIL/uL — ABNORMAL LOW (ref 3.87–5.11)
RDW: 16.6 % — ABNORMAL HIGH (ref 11.5–15.5)
WBC: 21.7 10*3/uL — ABNORMAL HIGH (ref 4.0–10.5)
nRBC: 0 % (ref 0.0–0.2)

## 2024-08-23 LAB — COMPREHENSIVE METABOLIC PANEL WITH GFR
ALT: 24 U/L (ref 0–44)
AST: 40 U/L (ref 15–41)
Albumin: 2.6 g/dL — ABNORMAL LOW (ref 3.5–5.0)
Alkaline Phosphatase: 104 U/L (ref 38–126)
Anion gap: 15 (ref 5–15)
BUN: 90 mg/dL — ABNORMAL HIGH (ref 8–23)
CO2: 23 mmol/L (ref 22–32)
Calcium: 8.9 mg/dL (ref 8.9–10.3)
Chloride: 106 mmol/L (ref 98–111)
Creatinine, Ser: 3.78 mg/dL — ABNORMAL HIGH (ref 0.44–1.00)
GFR, Estimated: 13 mL/min — ABNORMAL LOW
Glucose, Bld: 128 mg/dL — ABNORMAL HIGH (ref 70–99)
Potassium: 3.9 mmol/L (ref 3.5–5.1)
Sodium: 143 mmol/L (ref 135–145)
Total Bilirubin: 0.3 mg/dL (ref 0.0–1.2)
Total Protein: 5.9 g/dL — ABNORMAL LOW (ref 6.5–8.1)

## 2024-08-23 LAB — GLUCOSE, CAPILLARY
Glucose-Capillary: 102 mg/dL — ABNORMAL HIGH (ref 70–99)
Glucose-Capillary: 119 mg/dL — ABNORMAL HIGH (ref 70–99)
Glucose-Capillary: 125 mg/dL — ABNORMAL HIGH (ref 70–99)
Glucose-Capillary: 140 mg/dL — ABNORMAL HIGH (ref 70–99)
Glucose-Capillary: 155 mg/dL — ABNORMAL HIGH (ref 70–99)
Glucose-Capillary: 173 mg/dL — ABNORMAL HIGH (ref 70–99)

## 2024-08-23 LAB — BPAM RBC
Blood Product Expiration Date: 202602132359
ISSUE DATE / TIME: 202601250409
Unit Type and Rh: 6200

## 2024-08-23 LAB — PHOSPHORUS: Phosphorus: 3.5 mg/dL (ref 2.5–4.6)

## 2024-08-23 LAB — C-REACTIVE PROTEIN: CRP: 38.1 mg/dL — ABNORMAL HIGH

## 2024-08-23 LAB — TRIGLYCERIDES: Triglycerides: 172 mg/dL — ABNORMAL HIGH

## 2024-08-23 LAB — MAGNESIUM: Magnesium: 2.2 mg/dL (ref 1.7–2.4)

## 2024-08-23 MED ORDER — NICARDIPINE HCL IN NACL 20-0.86 MG/200ML-% IV SOLN
0.0000 mg/h | INTRAVENOUS | Status: DC
  Administered 2024-08-23: 12.5 mg/h via INTRAVENOUS
  Administered 2024-08-23: 7.5 mg/h via INTRAVENOUS
  Administered 2024-08-23: 12.5 mg/h via INTRAVENOUS
  Administered 2024-08-23: 15 mg/h via INTRAVENOUS
  Administered 2024-08-23: 5 mg/h via INTRAVENOUS
  Administered 2024-08-23: 15 mg/h via INTRAVENOUS
  Administered 2024-08-23: 12.5 mg/h via INTRAVENOUS
  Filled 2024-08-23 (×2): qty 400
  Filled 2024-08-23 (×2): qty 200
  Filled 2024-08-23: qty 400

## 2024-08-23 MED ORDER — HYDRALAZINE HCL 20 MG/ML IJ SOLN
10.0000 mg | INTRAMUSCULAR | Status: DC | PRN
  Administered 2024-08-23: 10 mg via INTRAVENOUS
  Filled 2024-08-23: qty 1

## 2024-08-23 MED ORDER — ONDANSETRON HCL 4 MG/2ML IJ SOLN
4.0000 mg | Freq: Four times a day (QID) | INTRAMUSCULAR | Status: AC | PRN
  Administered 2024-08-23 – 2024-09-02 (×8): 4 mg via INTRAVENOUS
  Filled 2024-08-23 (×8): qty 2

## 2024-08-23 MED ORDER — ACETAMINOPHEN 10 MG/ML IV SOLN
1000.0000 mg | Freq: Three times a day (TID) | INTRAVENOUS | Status: AC | PRN
  Administered 2024-08-23: 1000 mg via INTRAVENOUS
  Filled 2024-08-23 (×2): qty 100

## 2024-08-23 MED ORDER — TRACE MINERALS CU-MN-SE-ZN 300-55-60-3000 MCG/ML IV SOLN
INTRAVENOUS | Status: AC
  Filled 2024-08-23: qty 672

## 2024-08-23 MED ORDER — LACTATED RINGERS IV BOLUS
500.0000 mL | Freq: Once | INTRAVENOUS | Status: AC
  Administered 2024-08-23: 500 mL via INTRAVENOUS

## 2024-08-23 MED ORDER — IPRATROPIUM-ALBUTEROL 0.5-2.5 (3) MG/3ML IN SOLN
3.0000 mL | Freq: Three times a day (TID) | RESPIRATORY_TRACT | Status: DC
  Administered 2024-08-24 – 2024-08-29 (×16): 3 mL via RESPIRATORY_TRACT
  Filled 2024-08-23 (×17): qty 3

## 2024-08-23 NOTE — Progress Notes (Signed)
 PHARMACY - TOTAL PARENTERAL NUTRITION CONSULT NOTE  Indication: Esophageal leak  Patient Measurements: Height: 5' 3 (160 cm) Weight: 76.6 kg (168 lb 14 oz) IBW/kg (Calculated) : 52.4 TPN AdjBW (KG): 55.8 Body mass index is 29.91 kg/m.  Assessment:  10 YOF with NSCLC here for RML lobectomy, resection of esophageal diverticulum, and lymph node dissection on 12/29.  Found to have a small contained leak post-op.   Patient reports an intentional weight loss of 40 lbs over a couple of years when she was diagnosed with DM.  She typically drinks one Orgain shake every morning for breakfast and eats one meal a day, which could be pizza, spaghetti, chicken, beef or fish along with vegetables and fruits.  Patient was eating normally until her planned surgery. RD concerned with patient being malnourished. Patient was on TPN 12/31 - 1/12 and was advanced to dysphagia 1 diet. However, she was NPO again 1/14-1/16. On 1/17 IR reported small amount of leak from distal esophageal stent. Patient developed AKI since TPN was held. Pharmacy reconsulted for TPN 1/17.  Nephrology following. Serologies negative. Suspect plateau phase of ATN. No RRT. Good urine output.   Glucose / Insulin : hx DM, A1c 5.9% - BG 119-177 improved, 20 units SSI/24h, glargine 20 units/24hr per TCTS, 20 units insulin  in TPN (increased 1/25)  Electrolytes: Na 143 (from 137), K 3.9, CL 106, CO2 23 (1:1 TPN), Phos: 3.5, Mg: 2.2, CoCa: 10.0 Renal: AKI - SCr 3.78 up (BL 0.8), BUN 90 Hepatic: 1/26- LFTs / tbili WNL, albumin  2.6, TG 172 1/26 (noted clevidipine  added on 1/26)  Intake / Output; MIVF: UOP 2485 mL (1.4 ml/kg/hr), LBM 1/25, chest tube 0 mL - last lasix  1/24, LR bolus x1 overnight  Net IO Since Admission: 17,392.98 mL [08/23/24 0852]  GI Imaging:   12/30 esophagram: Positive for contrast extravasation and an esophageal leak in the mid esophagus 1/2 esophagram: Persistent contrast extravasation and esophageal leak, similar to  prior 1/6 esophagram: Limited contrast study due to patient aspiration and severe coughing spell. Study aborted. 1/12 esophagram: Patent esophageal stent. No evidence for extravasation or leak, Delayed esophageal emptying and gastroesophageal reflux leading to stasis of contrast in the esophagus. 1/15 CT: displaced esophageal stent in stomach, no bowel obstruction  1/17 esophagram: small volume esophageal leak at distal stent 1/20 esophagram: no evidence of any leak  GI Surgeries / Procedures:  12/30 right middle lobectomy, resection esophageal diverticulum, lymph node biopsy  1/7 EGD- stent placement  1/9 EGD stent repositioned d/t migration to stomach  1/16 EGD- stent placed at esophageal ulcer, removed gastric stent, normal duoenum  1/19 EGD - stent replaced / repositioned   Central access: PICC 07/28/24 TPN start date: 07/28/24 > 1/12; 1/17 >>   Nutritional Goals:  Goal unconcentrated TPN rate 75 ml/hr to provide 101g AA and 1721 kCal Goal concentrated TPN rate 60 mL/hr to provide 101 g AA and 1743 kCal  RD Estimated Needs Total Energy Estimated Needs: 1700-1900 Total Protein Estimated Needs: 100-115g Total Fluid Estimated Needs: >1.7L/day  Current Nutrition:  TPN 1/17 >> 1/08 CLD - passed swallow 1/09 CLD - patient feels can't swallow well > NPO 1/13 advanced to DYS1 1/14 NPO  1/21 FLD, Glucerna x1 on 1/21, milkshake per patient  1/24 NPO (worsening O2 requirements overnight)  Plan: Continue concentrated TPN at goal 60 mL/hr at 1800 given ongoing diuresis, meeting 100% estimated needs. Noted clevidipine  added overnight, plan to transition to nicardipine  therefore will not adjust TPN for clevi requirements. Will  hold off on un-concentrating TPN. Will use volume from nicardipine  for now per discussion with CVICU pharmacist.  Electrolytes in TPN: reduce Na 25 mEq/L, reduce K 25 mEq/L (= 36 mEq given worsening AKI), Ca 3 mEq/L, Mg 7 mEq/L, Phos 7 mmol/L. Cl:Ac 1:2, unable to do  1:1 with current formula.   Add standard MVI and trace elements to TPN Continue resistant SSI to q4h and adjust as needed Insulin  glargine 20 units QD per TCTS  Continue insulin  regular 20 units to TPN per confirmation with TCTS Monitor TPN labs on Mon/Thurs, and PRN  Thank you for allowing pharmacy to be a part of this patients care.  Powell Blush, PharmD, BCCCP  Clinical Pharmacist

## 2024-08-23 NOTE — Evaluation (Signed)
 Occupational Therapy Evaluation Patient Details Name: [name] MRN: [id number] DOB: [birth year] Today's Date: 08/23/2024   History of Present Illness   69 yo female presented to [facility] 07/26/24 for Right middle lobectomy to remove non-small cell carcinoma and resection of esophogeal diverticulum. Returned to OR 08/04/24 for EGD with esophageal stent, s/p esophageal perforation. 08/06/24 return to OR for replacement of stent due to migration into stomach. 1/16 bronchoscopy, and bronchoalveolar lavage, replacement of new esophagus stent with clipping, 08/16/24 upper GI endoscopy. New AKI EFY:unajrrn abuse, positive tuberculosis test as a young woman, hypertension, hyperlipidemia, heart murmur, type 2 diabetes, and anxiety.     Clinical Impressions PTA, pt living with her spouse and reported being independent. Upon eval, pt with slow processing, needing increased time to answer basic questions about self and orientation, pt needing significantly increased time for initiation during mobility and BADL as well. Perseverating on why she ha been needing oxygen and how so much has happened to her acutely throughout session. Overall, pt with general weakness and needing CGA-min A for bed mobility. EOB, sitting with supervision; began to cough bloody sputum and frequently taking off NRB with desat as low as 82%; returned to supine and pt recovered to 92% within 1 minute. BM with no awareness; cleaned up at bed level with total A. Will continue to follow and progress as able.      If plan is discharge home, recommend the following:   A lot of help with walking and/or transfers;Two people to help with walking and/or transfers;A little help with bathing/dressing/bathroom;Assistance with cooking/housework;Assist for transportation;Help with stairs or ramp for entrance     Functional Status Assessment   Patient has had a recent decline in their functional status and demonstrates the ability to make significant  improvements in function in a reasonable and predictable amount of time.     Equipment Recommendations   Other (comment) (defer)     Recommendations for Other Services   Rehab consult     Precautions/Restrictions   Precautions Precautions: Other (comment) Precaution/Restrictions Comments: chest tube, TPN Restrictions Weight Bearing Restrictions Per Provider Order: No     Mobility Bed Mobility Overal bed mobility: Needs Assistance Bed Mobility: Supine to Sit, Sit to Supine     Supine to sit: Supervision, HOB elevated Sit to supine: Contact guard assist        Transfers                   General transfer comment: pt deferred initially then deferred at end of session due to desat down to 80s      Balance Overall balance assessment: Needs assistance Sitting-balance support: Feet unsupported, No upper extremity supported Sitting balance-Leahy Scale: Good                                     ADL either performed or assessed with clinical judgement   ADL Overall ADL's : Needs assistance/impaired Eating/Feeding: NPO   Grooming: Set up;Sitting   Upper Body Bathing: Set up;Sitting       Upper Body Dressing : Set up;Sitting   Lower Body Dressing: Set up;Bed level Lower Body Dressing Details (indicate cue type and reason): socks   Toilet Transfer Details (indicate cue type and reason): deferred 2/2 desat and need to return to supine Toileting- Clothing Manipulation and Hygiene: Total assistance;Bed level  Vision Patient Visual Report: No change from baseline Additional Comments: pt denies changes     Perception Perception: Not tested       Praxis Praxis: Not tested       Pertinent Vitals/Pain Pain Assessment Pain Assessment: Faces Faces Pain Scale: Hurts a little bit Pain Location: chest Pain Descriptors / Indicators: Constant, Sharp, Shooting, Tender Pain Intervention(s): Limited activity within  patient's tolerance, Monitored during session     Extremity/Trunk Assessment Upper Extremity Assessment Upper Extremity Assessment: Generalized weakness;Left hand dominant   Lower Extremity Assessment Lower Extremity Assessment: Defer to PT evaluation   Cervical / Trunk Assessment Cervical / Trunk Assessment: Other exceptions Cervical / Trunk Exceptions: chest tube   Communication Communication Communication: No apparent difficulties   Cognition Arousal: Alert Behavior During Therapy: Flat affect Cognition: Cognition impaired   Orientation impairments: Time Awareness: Online awareness impaired, Intellectual awareness impaired (BM in bed and pt unaware.) Memory impairment (select all impairments): Working memory Attention impairment (select first level of impairment): Sustained attention Executive functioning impairment (select all impairments): Initiation, Organization, Reasoning OT - Cognition Comments: pt reports people are only concerned with whether she can swallow when OT in room asking her to mobilize. Unsure if confused or perseverative on PO                 Following commands: Intact       Cueing  General Comments   Cueing Techniques: Verbal cues;Gestural cues;Tactile cues  Pt on 15 L via NRB. once EOB, coughing up bloody sputum and constantly attempting to take off NRB mask with desat down to 80s. returned to supine and recovering to mid 90s within ~1 minute   Exercises     Shoulder Instructions      Home Living Family/patient expects to be discharged to:: Private residence Living Arrangements: Spouse/significant other Available Help at Discharge: Family;Available 24 hours/day Type of Home: House Home Access: Stairs to enter [employer] of Steps: 4 Entrance Stairs-Rails: Right;Left Home Layout: One level     Bathroom Shower/Tub: Chief Strategy Officer: Standard     Home Equipment:  (pt reports being unsure)           Prior Functioning/Environment Prior Level of Function : Independent/Modified Independent             Mobility Comments: community ambulator, driver ADLs Comments: independent with ADLs and iADLs, enjoys gardening    OT Problem List: Decreased strength;Decreased activity tolerance;Impaired balance (sitting and/or standing);Decreased knowledge of use of DME or AE;Cardiopulmonary status limiting activity   OT Treatment/Interventions: Self-care/ADL training;Therapeutic exercise;DME and/or AE instruction;Therapeutic activities;Patient/family education;Balance training;Cognitive remediation/compensation      OT Goals(Current goals can be found in the care plan section)   Acute Rehab OT Goals Patient Stated Goal: get better OT Goal Formulation: With patient Time For Goal Achievement: 09/06/24 Potential to Achieve Goals: Good   OT Frequency:  Min 2X/week    Co-evaluation              AM-PAC OT 6 Clicks Daily Activity     Outcome Measure Help from another person eating meals?: Total Help from another person taking care of personal grooming?: A Little Help from another person toileting, which includes using toliet, bedpan, or urinal?: Total Help from another person bathing (including washing, rinsing, drying)?: A Lot Help from another person to put on and taking off regular upper body clothing?: A Little Help from another person to put on and taking off regular lower body clothing?:  A Little 6 Click Score: 13   End of Session Equipment Utilized During Treatment: Oxygen Nurse Communication: Mobility status  Activity Tolerance: Patient tolerated treatment well Patient left: in bed;with call bell/phone within reach;with bed alarm set  OT Visit Diagnosis: Unsteadiness on feet (R26.81);Muscle weakness (generalized) (M62.81);Other symptoms and signs involving cognitive function                Time: 1127-1209 OT Time Calculation (min): 42 min Charges:  OT General  Charges $OT Visit: 1 Visit OT Evaluation $OT Eval Moderate Complexity: 1 Mod OT Treatments $Self Care/Home Management : 8-22 mins $Therapeutic Activity: 8-22 mins  Elma JONETTA Penner, OTD, OTR/L [facility] Acute Rehabilitation Office: [phone]   Elma JONETTA Penner 08/23/2024, 3:33 PM

## 2024-08-23 NOTE — Progress Notes (Signed)
 "  NAME:  [name], MRN:  [id number], DOB:  July 12, 1956, LOS: 28 ADMISSION DATE:  07/26/2024, CONSULTATION DATE:  1/15 REFERRING MD:  Kerrin, CHIEF COMPLAINT:  sepsis   History of Present Illness:  Ms. [name] is a 69 y/o woman with a history of tobacco abuse and lung adenocarcinoma status post RML lobectomy on 12/29.  At the time of surgery was a large cyst that was resected. Despite oversewing it time resection she later developed an esophageal leak.  She required an esophageal stent which she has had recurrent problems with related to positioning/migration of the stent (necessitating multiple EGDs for stent revision). From 1/15 through 1/18, the patient was consistently total body balance positive in setting of receiving NS @ 100 mL/hr and then starting TPN as of 1/18. Due to concomitant AKI, developed volume overload and pulmonary edema and hypertensive urgency ultimately requiring a Cardene  drip be started overnight 1/19. Also on 1/19, she underwent revision of the position of her esophageal stent to address a small esophageal leak.  Pertinent  Medical History  Lung adenocarcinoma Latent  TB, treated in 1980 HTN HLD DM OSA Tobacco abuse  Significant Hospital Events: Including procedures, antibiotic start and stop dates in addition to other pertinent events   12/29 RML lobectomy 1/7 EGD with esophageal stent 1/9 EGD  with stent reposition after migration to stomach 1/15 vanc added, zosyn  restarted. CT  1/19: EGD for stent repositioning due to ongoing small esophageal leak / stent migration 1/20: Gastrograffin study -> no esophageal leak. Started on clear liquid diet. 1/21: Full liquid diet. Diuresed.  Interim History / Subjective:  This morning feels about the same. Wants to drink water.  Objective    Blood pressure (!) 128/116, pulse (!) 111, temperature (!) 100.6 F (38.1 C), resp. rate 18, height 5' 3 (1.6 m), weight 76.6 kg, last menstrual period 10/26/1998, SpO2  97%. CVP:  [3 mmHg-32 mmHg] 32 mmHg  FiO2 (%):  [100 %] 100 %   Intake/Output Summary (Last 24 hours) at 08/23/2024 0917 Last data filed at 08/23/2024 0818 Gross per 24 hour  Intake 2669.12 ml  Output 2615 ml  Net 54.12 ml   Filed Weights   08/21/24 0500 08/22/24 0500 08/23/24 0426  Weight: 78 kg 77.5 kg 76.6 kg    Examination: General: chronically ill appearing woman sitting up in bed in NAD HENT: [place]/AT, eyes anicteric Lungs:  Breathing comfortably on NRB; no accessory muscle use, no significant conversational dyspnea. Rhales bilaterally- no significant change from yesterday's exam.  Cardiovascular: S1S2, RRR Abdomen: soft, NT Extremities: no cyanosis or edema  Na+ 143 BUN 90 Cr 3.78 WBC 21.7 H/H 8.5/25 Platelets 180 CRP 38.1  Blood cultures: pending  I/O balance: essentially even past 24 hrs, net +17.4L for admission Chest tube 0 cc   Resolved problem list   Assessment and Plan   Esophageal leak- S/p esophageal stenting c/b migration, now s/p repositioning by GI most recently on 1/19 -con't monitoring chest tube output-- has been very low since she has been NPO for the past few days -ok for clears today since respiratory status is stable -post-op management per TCTS; appreciate GI's management  Acute respiratory insufficiency with hypoxia, concern for aspiration, but now worsening left lung infiltrate is worrisome for dysphagia or stent related reflux & aspiration. Another possibility is inflammation from another source- potentially infecious.  Bronchial defect in BI; confirmed on bronchoscopy -trying to avoid steroids with risk of delayed healing of esophagus -con't NRB; can con't trying to  wean to ventimask -con't broad antibiotics- linezolid , cefepime , flagyl  -pulmonary hygiene -follow trach aspirate culture -remains high risk for intubation; if this is the case, she needs a bronch for cultures.   Low grade fevers -con't broad antibiotics -follow blood and  sputum cultures -con't empiric fluconazole   AKI c/b ATN and hypervolemia> now appears euvolemic on exam and renal function is worsening with diuresis Hyponatremia Suspect she is euvolemic to hypovolemic on exam; low CVP supports this -strict I/O; keep foley for now -give IVF bolus; monitor respiratory status closely with this -renally dose meds, avoid nephrotoxic meds  Hypertensive urgency -change cleviprex  to nicardipine   Lung adenocarcinoma, s/p lobectomy -back on fentanyl  patch; has dilaudid  pushes available while NPO -if doing ok with liquids may be able to go back to pills -chest tube per TCTS  H/o tobacco abuse -recommend total cessation  Hyperglycemia, not fully controlled -insulin  in TPN -glargine 20 units daily  + SSI PRN -goal BG 140-180 -con't holding jardiance  Deconditioning -PT, OT -have encouraged her to stay up in the chair as much as possible; has higher doses of pain meds to facilitate this  Severe protein energy malnutrition -TPN =-trial of clears today  Plan d/w TCTS during rounds. Continues to require ICU care due to risk of requiring intubation.    Labs   CBC: Recent Labs  Lab 08/19/24 0732 08/20/24 0258 08/21/24 0427 08/22/24 0222 08/23/24 0415  WBC 16.0* 15.4* 17.7* 15.8* 21.7*  HGB 8.5* 8.1* 7.6* 6.8* 8.5*  HCT 24.7* 24.0* 21.7* 20.1* 25.0*  MCV 91.1 90.9 95.6 89.7 89.9  PLT 168 134* 131* 138* 180    Basic Metabolic Panel: Recent Labs  Lab 08/17/24 0958 08/18/24 0742 08/18/24 1451 08/19/24 0654 08/20/24 0258 08/21/24 0549 08/21/24 0700 08/22/24 0222 08/23/24 0415  NA 143 141   < > 138 135 132*  --  137 143  K 3.4* 3.6   < > 3.7 3.8 3.7  --  3.8 3.9  CL 108 106   < > 101 97* 95*  --  100 106  CO2 23 24   < > 25 23 22   --  22 23  GLUCOSE 226* 159*   < > 158* 152* 163*  --  146* 128*  BUN 58* 62*   < > 66* 78* 84*  --  91* 90*  CREATININE 2.72* 2.58*   < > 2.66* 2.98* 3.25*  --  3.51* 3.78*  CALCIUM 8.1* 7.9*   < > 8.3*  7.9* 8.3*  --  8.2* 8.9  MG 2.0  --   --  1.5* 2.3  --   --   --  2.2  PHOS 2.1* 2.7  --  3.1 3.7  --  4.3  --  3.5   < > = values in this interval not displayed.   GFR: Estimated Creatinine Clearance: 14.4 mL/min (A) (by C-G formula based on SCr of 3.78 mg/dL (H)). Recent Labs  Lab 08/20/24 0258 08/21/24 0427 08/22/24 0222 08/23/24 0415  WBC 15.4* 17.7* 15.8* 21.7*    This patient is critically ill with multiple organ system failure which requires frequent high complexity decision making, assessment, support, evaluation, and titration of therapies. This was completed through the application of advanced monitoring technologies and extensive interpretation of multiple databases. During this encounter critical care time was devoted to patient care services described in this note for 40 minutes.   Leita SHAUNNA Gaskins, DO 08/23/24 9:30 AM [facility]  For contact information,  see Amion. If no response to pager, please call PCCM 2H APP. After hours, 7PM- 7AM, please call on call APP for 2H.  "

## 2024-08-23 NOTE — Progress Notes (Signed)
 Physical Therapy Treatment Patient Details Name: [name] MRN: [id number] DOB: 1955/07/30 Today's Date: 08/23/2024   History of Present Illness 69 yo female presented to [facility] 07/26/24 for Right middle lobectomy to remove non-small cell carcinoma and resection of esophogeal diverticulum. Returned to OR 08/04/24 for EGD with esophageal stent, s/p esophageal perforation. 08/06/24 return to OR for replacement of stent due to migration into stomach. 1/16 bronchoscopy, and bronchoalveolar lavage, replacement of new esophagus stent with clipping, 08/16/24 upper GI endoscopy. New AKI. Pt desaturating requiring NRB on 1/24.  EFY:unajrrn abuse, positive tuberculosis test as a young woman, hypertension, hyperlipidemia, heart murmur, type 2 diabetes, and anxiety.    PT Comments  Pt is currently presenting at Min A for bed mobility, 2 person Min A for sit to stand and transfers with hand held assist. Pt is limited due to back/neck pain. Requires encouragement to participate due to pain in the back/neck; pt is expressing frustration with on-going illness. Due to pt current functional status, home set up and available assistance at home recommending skilled physical therapy services > 3 hours/day in order to address strength, balance and functional mobility to decrease risk for falls, injury, immobility, skin break down and re-hospitalization.      If plan is discharge home, recommend the following: A little help with bathing/dressing/bathroom;A little help with walking and/or transfers;Assistance with cooking/housework;Assist for transportation;Help with stairs or ramp for entrance     Equipment Recommendations  Rolling walker (2 wheels);BSC/3in1       Precautions / Restrictions Precautions Precautions: Fall Precaution/Restrictions Comments: chest tube, foley, TPN, watch O2 sats Restrictions Weight Bearing Restrictions Per Provider Order: No     Mobility  Bed Mobility Overal bed mobility: Needs  Assistance Bed Mobility: Supine to Sit     Supine to sit: Min assist     General bed mobility comments: Min a to pull trunk to midline.    Transfers Overall transfer level: Needs assistance Equipment used: 2 person hand held assist Transfers: Sit to/from Stand, Bed to chair/wheelchair/BSC Sit to Stand: Min assist   Step pivot transfers: Min assist       General transfer comment: Min A for sit to stand and step pivot transfer with cues for sequencing and assist with lines.    Ambulation/Gait   General Gait Details: unable to progress gait this date due to pt pain      Balance Overall balance assessment: Needs assistance Sitting-balance support: Feet unsupported, No upper extremity supported Sitting balance-Leahy Scale: Good     Standing balance support: Bilateral upper extremity supported, During functional activity Standing balance-Leahy Scale: Fair Standing balance comment: no overt LOB      Communication Communication Communication: No apparent difficulties  Cognition Arousal: Alert Behavior During Therapy: Anxious   PT - Cognitive impairments: Awareness, Memory     PT - Cognition Comments: Pt has poor awareness of her functional abilitites and is making statements consistent with frustration from on-going hospitalization Following commands: Intact      Cueing Cueing Techniques: Verbal cues, Gestural cues, Tactile cues     General Comments General comments (skin integrity, edema, etc.): Pt on 15L via NRB with O2 sats above 90% during activity. Pt continuously requiring washcloth for bloddy sputum throughout session taking on NRB to pick clumps out of her mouth and wipe on wash cloth. No drop in O2 noted once sitting up with feet on floor and improved posture.      Pertinent Vitals/Pain Pain Assessment Pain Assessment: 0-10 Pain Score: 9  Pain Location: back Pain Descriptors / Indicators: Constant, Sharp, Shooting, Tender Pain Intervention(s):  Monitored during session, Limited activity within patient's tolerance, Repositioned    Home Living Family/patient expects to be discharged to:: Private residence Living Arrangements: Spouse/significant other Available Help at Discharge: Family;Available 24 hours/day Type of Home: House Home Access: Stairs to enter Entrance Stairs-Rails: Doctor, [facility] of Steps: 4   Home Layout: One level Home Equipment:  (pt reports being unsure)          PT Goals (current goals can now be found in the care plan section) Acute Rehab PT Goals PT Goal Formulation: With patient Time For Goal Achievement: 09/03/24 Potential to Achieve Goals: Fair Progress towards PT goals: Not progressing toward goals - comment (pt with decline in medical status)    Frequency    Min 3X/week      PT Plan  Continue with current POC        AM-PAC PT 6 Clicks Mobility   Outcome Measure  Help needed turning from your back to your side while in a flat bed without using bedrails?: A Little Help needed moving from lying on your back to sitting on the side of a flat bed without using bedrails?: A Little Help needed moving to and from a bed to a chair (including a wheelchair)?: A Lot Help needed standing up from a chair using your arms (e.g., wheelchair or bedside chair)?: A Lot Help needed to walk in hospital room?: Total Help needed climbing 3-5 steps with a railing? : Total 6 Click Score: 12    End of Session Equipment Utilized During Treatment: Gait belt;Oxygen Activity Tolerance: Patient tolerated treatment well;Patient limited by pain Patient left: with call bell/phone within reach;in chair;with chair alarm set Nurse Communication: Mobility status;Other (comment) (pt requesting a small bottle of water with ice) PT Visit Diagnosis: Unsteadiness on feet (R26.81);Muscle weakness (generalized) (M62.81);Difficulty in walking, not elsewhere classified (R26.2);Pain Pain - part of body:  (low  back and neck)     Time: 8453-8383 PT Time Calculation (min) (ACUTE ONLY): 30 min  Charges:    $Therapeutic Activity: 23-37 mins PT General Charges $$ ACUTE PT VISIT: 1 Visit                    Dorothyann Maier, DPT, CLT  Acute Rehabilitation Services Office: [phone] (Secure chat preferred)    Dorothyann VEAR Maier 08/23/2024, 4:56 PM

## 2024-08-23 NOTE — Progress Notes (Signed)
" ° °  [address], [place] [address]             [phone]   Up in chair, feels a little better this afternoon  BP (!) 141/69   Pulse (!) 113   Temp 99.5 F (37.5 C)   Resp 19   Ht 5' 3 (1.6 m)   Wt 76.6 kg   LMP 10/26/1998 Comment: ~IN 40s  SpO2 96%   BMI 29.91 kg/m  Still on NRB   Intake/Output Summary (Last 24 hours) at 08/23/2024 1649 Last data filed at 08/23/2024 1600 Gross per 24 hour  Intake 4003.38 ml  Output 2323 ml  Net 1680.38 ml   Elspeth C. Kerrin, MD [facility] [phone]  "

## 2024-08-23 NOTE — Progress Notes (Signed)
 Nutrition Follow-up  DOCUMENTATION CODES:   Severe malnutrition in context of acute illness/injury  INTERVENTION:   Continue TPN to meet 100% nutritional needs  Plan to resume Boost Plus po TID (chocolate) once tolerating po diet, each supplement provides 360 kcal and 14 grams of protein   NUTRITION DIAGNOSIS:   Severe Malnutrition related to acute illness as evidenced by moderate muscle depletion, energy intake < or equal to 50% for > or equal to 5 days.  Continues but being addressed via TPN  GOAL:   Patient will meet greater than or equal to 90% of their needs  Progressing  MONITOR:   Diet advancement, Supplement acceptance, PO intake, Weight trends  REASON FOR ASSESSMENT:   Consult New TPN/TNA  ASSESSMENT:   Pt with PMH significant for: T2DM, HLD, HTN, sleep apnea, anxiety and depression. 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 Now s/p right middle lobectomy and resection of esophageal diverticulum.  12/29 admitted; OR: middle lobectomy and resection of esophageal diverticulum 12/30 esophagram: small contained esophageal leak 12/31 PICC placed; TPN started 1/06 aspirated contrast - esophagram unable to be completed 1/07 esophageal stent placed; TPN at goal 1/08 diet adv CLD  1/09 reports of excessive coughing; esophageal stent found to be in wrong position 1/10 adjustment to esophageal stent 1/12 repeat esophagram; diet advanced to clear liquid diet 1/13 - diet advanced DYS1/thins - increased coughing 1/14 - NPO; esophageal stent migration 1/15 - CT: displaced esophageal stent, small R pleural effusion 1/16 - EGD w/ stent replacement, bronch 1/17 - TPN restarted 1/20 EGD and Esophogram with no signs of leak, diet advanced to CL 1/21 Diet adv to FL, +nausea 1/22 Diet adv to Regular 1/24 Back to strict NPO, worsening respiratory status, concern for aspiration   Remains on NRB, high risk for  intubation Transitioning Cleviprex  to Nicardipene  Currently NPO, noted plan to trial clears today per MD. No current diet order  TPN at 60 ml/hr (1440 mL in 24 hours) providing 101 g of protein, 1743 kcals  Receiving standard MVI and trace elements in TPN  MD noting pt appears euvolemic to hypovolemic on exam; IVF bolus ordered UOP around 2.5 L in 24 hours without diuresis;   Creatinine continues to trend up, nephrology following but no acute plan for RRT  Constipation remains a concern. Per nursing documentation, pt with one type 6 small BM yesterday, +nausea. Prior to this, last BM 1/21 (BMs x 2: 1 bloody secondary to hemorrhoids)  Prior to this, the only other documented BM was on 1/13. No other BM documented this admission, LOS 28 days.   Chest tube remains in place, output low  CBGs Noted 20 units insulin  in TPN. Pt on ss insulin  and 20 units of lantus   Weight is hard to trend; current wt 76.6 kg down from 78 kg 2 days (1/24) but weight of 71.2 kg on 1/23 and 66 kg on 1/22.  Net+9-10 L per I/O flowsheet but question accuracy  Labs: BUN 90 Creatinine 3.78 BUN:Cr  23.8 Sodium 143 (wdl) Potassium 3.9 wdl) Phosphorus 3.5 (wdl) Magnesium  2.2 (wdl) TG 172 (acceptable for lipid infusion) CRP 38.1 (H)  Meds: SS novolog  Lantus  Zofran  prn Dulcolax prn    Diet Order:   Diet Order     None       EDUCATION NEEDS:   Education needs have been addressed  Skin:  Skin Assessment: Reviewed RN Assessment  Last BM:  1/25 Small BM x 1 (  last BM prior to this 1/21)  Height:   Ht Readings from Last 1 Encounters:  08/19/24 5' 3 (1.6 m)    Weight:   Wt Readings from Last 1 Encounters:  08/23/24 76.6 kg    BMI:  Body mass index is 29.91 kg/m.  Estimated Nutritional Needs:   Kcal:  1700-1900  Protein:  100-115g  Fluid:  >1.7L/day   Betsey Finger MS, RDN, LDN, CNSC Registered Dietitian 3 Clinical Nutrition RD Inpatient Contact Info in Amion

## 2024-08-23 NOTE — TOC Progression Note (Signed)
 Transition of Care [facility]) - Progression Note    Patient Details  Name: [name] MRN: [id number] Date of Birth: [birth year]  [facility]) CM/SW Contact  Graves-Bigelow, Erminio Deems, RN Phone Number: 08/23/2024, 3:40 PM  Clinical Narrative: Patient post clipping of esophageal stent, bronchoscopy, and EGD. Patient continues on Cardene  gtt. ICM will continue to follow for disposition needs as the patient progresses.   Expected Discharge Plan: Home/Self Care Barriers to Discharge: Continued Medical Work up  Living arrangements for the past 2 months: Single Family Home   Social Drivers of Health (SDOH) Interventions SDOH Screenings   Food Insecurity: No Food Insecurity (07/26/2024)  Housing: Low Risk (07/26/2024)  Transportation Needs: No Transportation Needs (07/26/2024)  Utilities: Not At Risk (07/26/2024)  Depression (PHQ2-9): Low Risk (06/16/2024)  Social Connections: Unknown (07/26/2024)  Tobacco Use: Medium Risk (08/16/2024)    Readmission Risk Interventions     No data to display

## 2024-08-23 NOTE — Progress Notes (Signed)
 [facility] NEPHROLOGY PROGRESS NOTE  Assessment/ Plan: Pt is a 69 y.o. yo female    # Acute kidney Injury: Suspected to have ATN secondary to ischemic and nephrotoxic etiologies.  Serologies negative for GN including vasculitis/anti-GBM given previous concerns of gross hematuria.  Likely ATN with continued worsening of renal function in the setting of need for diuresis for volume unloading.  No acute indication for renal replacement therapy at this time given continued response to diuretics. - Good urine output, no need for dialysis.  Continue to monitor urine output and lab.  # Esophageal leak following resection of esophageal diverticulum: Underwent placement of a new esophageal stent previous one migrated distally into the stomach.  EGD and esophagram without evidence of leak.   # Hypertensive urgency: Cleviprex  IV, monitor BP.  # Anemia: Requiring blood transfusion, monitor hemoglobin.    # Acute hypoxic respiratory failure: Suspected to be multifactorial with pneumonia versus pneumonitis/acute lung injury and possibly superimposed pulmonary edema.  On broad-spectrum antibiotics with cefepime /fluconazole /linezolid  as well as ongoing diuretic.  Noted worsening leukocytosis.  Followed by pulmonary team, high risk for intubation.  Subjective: Seen and examined in ICU.  Urine output is around 2.5 L.  Reports shortness of breath, reviewed chest x-ray consistent with diffuse opacity and small pleural effusion.  Discussed with ICU nurse. Objective Vital signs in last 24 hours: Vitals:   08/23/24 0800 08/23/24 0815 08/23/24 0830 08/23/24 0845  BP:  (!) 128/116    Pulse: (!) 116 (!) 117 (!) 118 (!) 111  Resp: (!) 29 (!) 21 (!) 26 18  Temp: (!) 100.6 F (38.1 C) (!) 100.6 F (38.1 C) (!) 100.6 F (38.1 C) (!) 100.6 F (38.1 C)  TempSrc:      SpO2: 96% 98% 98% 97%  Weight:      Height:       Weight change: -0.9 kg  Intake/Output Summary (Last 24 hours) at 08/23/2024  0923 Last data filed at 08/23/2024 0818 Gross per 24 hour  Intake 2669.12 ml  Output 2615 ml  Net 54.12 ml       Labs: RENAL PANEL Recent Labs  Lab 08/17/24 0958 08/18/24 0742 08/18/24 1451 08/19/24 0654 08/20/24 0258 08/21/24 0549 08/21/24 0700 08/22/24 0222 08/23/24 0415  NA 143 141   < > 138 135 132*  --  137 143  K 3.4* 3.6   < > 3.7 3.8 3.7  --  3.8 3.9  CL 108 106   < > 101 97* 95*  --  100 106  CO2 23 24   < > 25 23 22   --  22 23  GLUCOSE 226* 159*   < > 158* 152* 163*  --  146* 128*  BUN 58* 62*   < > 66* 78* 84*  --  91* 90*  CREATININE 2.72* 2.58*   < > 2.66* 2.98* 3.25*  --  3.51* 3.78*  CALCIUM 8.1* 7.9*   < > 8.3* 7.9* 8.3*  --  8.2* 8.9  MG 2.0  --   --  1.5* 2.3  --   --   --  2.2  PHOS 2.1* 2.7  --  3.1 3.7  --  4.3  --  3.5  ALBUMIN   --   --   --  2.8*  --   --   --   --  2.6*   < > = values in this interval not displayed.    Liver Function Tests: Recent Labs  Lab 08/19/24 [phone]  08/23/24 0415  AST 37 40  ALT 24 24  ALKPHOS 47 104  BILITOT 0.4 0.3  PROT 5.6* 5.9*  ALBUMIN  2.8* 2.6*   No results for input(s): LIPASE, AMYLASE in the last 168 hours. No results for input(s): AMMONIA in the last 168 hours. CBC: Recent Labs    08/19/24 0732 08/20/24 0258 08/21/24 0427 08/22/24 0222 08/23/24 0415  HGB 8.5* 8.1* 7.6* 6.8* 8.5*  MCV 91.1 90.9 95.6 89.7 89.9    Cardiac Enzymes: No results for input(s): CKTOTAL, CKMB, CKMBINDEX, TROPONINI in the last 168 hours. CBG: Recent Labs  Lab 08/22/24 1553 08/22/24 2001 08/22/24 2316 08/23/24 0340 08/23/24 0826  GLUCAP 169* 144* 150* 125* 119*    Iron Studies: No results for input(s): IRON, TIBC, TRANSFERRIN, FERRITIN in the last 72 hours. Studies/Results: DG CHEST PORT 1 VIEW Result Date: 08/23/2024 EXAM: 1 VIEW(S) XRAY OF THE CHEST 08/23/2024 05:45:24 AM COMPARISON: 08/21/2024 CLINICAL HISTORY: Pneumonia. FINDINGS: LINES, TUBES AND DEVICES: Esophageal stent in place in  proximal and mid esophagus. Right PICC line in place with tip in distal SVC. LUNGS AND PLEURA: Diffuse ground-glass opacities throughout left hemithorax and right upper lobe , not appreciably changed. Patchy airspace opacities scattered throughout bilaterally. Also unchanged in the interval. Small right pleural effusion. No pneumothorax. HEART AND MEDIASTINUM: No acute abnormality of the cardiac and mediastinal silhouettes. BONES AND SOFT TISSUES: No acute osseous abnormality. IMPRESSION: 1. No change in aeration to lungs compared with the previous exam. 2. Small right pleural effusion. Electronically signed by: Waddell Calk MD 08/23/2024 06:15 AM EST RP Workstation: [id number]   ECHOCARDIOGRAM COMPLETE Result Date: 08/22/2024    ECHOCARDIOGRAM REPORT   Patient Name:   [name] Date of Exam: 08/22/2024 Medical Rec #:  [id number]   Height:       63.0 in Accession #:    [id number]  Weight:       170.9 lb Date of Birth:  1955-12-11   BSA:          1.808 m Patient Age:    68 years    BP:           159/62 mmHg Patient Gender: F           HR:           112 bpm. Exam Location:  Inpatient Procedure: 2D Echo, Cardiac Doppler, Color Doppler and Intracardiac            Opacification Agent (Both Spectral and Color Flow Doppler were            utilized during procedure). Indications:    Abnormal ECG R94.31  History:        Patient has prior history of Echocardiogram examinations. Lung                 cancer; Risk Factors:Hypertension, Former Smoker and                 Dyslipidemia.  Sonographer:    Merlynn Argyle Referring Phys: LEITA SHAUNNA GASKINS  Sonographer Comments: Image acquisition challenging due to respiratory motion and tachycardia. IMPRESSIONS  1. Left ventricular ejection fraction, by estimation, is 60 to 65%. The left ventricle has normal function. The left ventricle has no regional wall motion abnormalities. There is mild left ventricular hypertrophy. Indeterminate diastolic filling due to E-A fusion.  2. Right  ventricular systolic function is normal. The right ventricular size is normal. Tricuspid regurgitation signal is inadequate for assessing PA pressure.  3. The mitral valve is  normal in structure. No evidence of mitral valve regurgitation. No evidence of mitral stenosis.  4. The aortic valve is tricuspid. Aortic valve regurgitation is not visualized. No aortic stenosis is present.  5. The inferior vena cava is normal in size with greater than 50% respiratory variability, suggesting right atrial pressure of 3 mmHg. FINDINGS  Left Ventricle: Left ventricular ejection fraction, by estimation, is 60 to 65%. The left ventricle has normal function. The left ventricle has no regional wall motion abnormalities. Definity  contrast agent was given IV to delineate the left ventricular  endocardial borders. The left ventricular internal cavity size was normal in size. There is mild left ventricular hypertrophy. Indeterminate diastolic filling due to E-A fusion. Right Ventricle: The right ventricular size is normal. No increase in right ventricular wall thickness. Right ventricular systolic function is normal. Tricuspid regurgitation signal is inadequate for assessing PA pressure. Left Atrium: Left atrial size was normal in size. Right Atrium: Right atrial size was normal in size. Pericardium: There is no evidence of pericardial effusion. Mitral Valve: The mitral valve is normal in structure. No evidence of mitral valve regurgitation. No evidence of mitral valve stenosis. Tricuspid Valve: The tricuspid valve is normal in structure. Tricuspid valve regurgitation is trivial. No evidence of tricuspid stenosis. Aortic Valve: The aortic valve is tricuspid. Aortic valve regurgitation is not visualized. No aortic stenosis is present. Pulmonic Valve: The pulmonic valve was normal in structure. Pulmonic valve regurgitation is trivial. No evidence of pulmonic stenosis. Aorta: The aortic root is normal in size and structure. Venous: The  inferior vena cava is normal in size with greater than 50% respiratory variability, suggesting right atrial pressure of 3 mmHg. IAS/Shunts: The interatrial septum was not well visualized.  LEFT VENTRICLE PLAX 2D LVIDd:         4.60 cm   Diastology LVIDs:         3.00 cm   LV e' medial:    9.36 cm/s LV PW:         1.10 cm   LV E/e' medial:  13.6 LV IVS:        1.10 cm   LV e' lateral:   18.30 cm/s LVOT diam:     2.00 cm   LV E/e' lateral: 6.9 LV SV:         63 LV SV Index:   35 LVOT Area:     3.14 cm  RIGHT VENTRICLE             IVC RV Basal diam:  3.20 cm     IVC diam: 1.50 cm RV S prime:     18.40 cm/s TAPSE (M-mode): 2.5 cm LEFT ATRIUM             Index        RIGHT ATRIUM           Index LA diam:        3.60 cm 1.99 cm/m   RA Area:     13.10 cm LA Vol (A2C):   43.6 ml 24.11 ml/m  RA Volume:   30.10 ml  16.64 ml/m LA Vol (A4C):   35.7 ml 19.74 ml/m LA Biplane Vol: 40.1 ml 22.17 ml/m  AORTIC VALVE LVOT Vmax:   107.00 cm/s LVOT Vmean:  73.700 cm/s LVOT VTI:    0.202 m  AORTA Ao Root diam: 3.50 cm Ao Asc diam:  3.00 cm MITRAL VALVE MV Area (PHT): 4.12 cm     SHUNTS MV Decel Time: 184 msec  Systemic VTI:  0.20 m MV E velocity: 127.00 cm/s  Systemic Diam: 2.00 cm MV A velocity: 98.50 cm/s MV E/A ratio:  1.29 Soyla Merck MD Electronically signed by Soyla Merck MD Signature Date/Time: 08/22/2024/12:14:31 PM    Final    VAS US  LOWER EXTREMITY VENOUS (DVT) Result Date: 08/22/2024  Lower Venous DVT Study Patient Name:  RADLEY TESTON  Date of Exam:   08/21/2024 Medical Rec #: [id number]    Accession #:    [id number] Date of Birth: 12/07/1955    Patient Gender: F Patient Age:   9 years Exam Location:  [facility] Procedure:      VAS US  LOWER EXTREMITY VENOUS (DVT) Referring Phys: LEITA GASKINS --------------------------------------------------------------------------------  Indications: Acute respiratory distress.  Limitations: Altered mental status. Comparison Study: No prior LEV on file Performing  Technologist: Alberta Lis RVS  Examination Guidelines: A complete evaluation includes B-mode imaging, spectral Doppler, color Doppler, and power Doppler as needed of all accessible portions of each vessel. Bilateral testing is considered an integral part of a complete examination. Limited examinations for reoccurring indications may be performed as noted. The reflux portion of the exam is performed with the patient in reverse Trendelenburg.  +---------+---------------+---------+-----------+----------+--------------+ RIGHT    CompressibilityPhasicitySpontaneityPropertiesThrombus Aging +---------+---------------+---------+-----------+----------+--------------+ CFV      Full           Yes      Yes                                 +---------+---------------+---------+-----------+----------+--------------+ SFJ      Full                                                        +---------+---------------+---------+-----------+----------+--------------+ FV Prox  Full                                                        +---------+---------------+---------+-----------+----------+--------------+ FV Mid   Full                                                        +---------+---------------+---------+-----------+----------+--------------+ FV DistalFull                                                        +---------+---------------+---------+-----------+----------+--------------+ PFV      Full                                                        +---------+---------------+---------+-----------+----------+--------------+ POP      Full           Yes  Yes                                 +---------+---------------+---------+-----------+----------+--------------+ PTV      Full                                                        +---------+---------------+---------+-----------+----------+--------------+ PERO     Full                                                         +---------+---------------+---------+-----------+----------+--------------+   +---------+---------------+---------+-----------+----------+--------------+ LEFT     CompressibilityPhasicitySpontaneityPropertiesThrombus Aging +---------+---------------+---------+-----------+----------+--------------+ CFV      Full           Yes      Yes                                 +---------+---------------+---------+-----------+----------+--------------+ SFJ      Full                                                        +---------+---------------+---------+-----------+----------+--------------+ FV Prox  Full                                                        +---------+---------------+---------+-----------+----------+--------------+ FV Mid   Full                                                        +---------+---------------+---------+-----------+----------+--------------+ FV DistalFull                                                        +---------+---------------+---------+-----------+----------+--------------+ PFV      Full                                                        +---------+---------------+---------+-----------+----------+--------------+ POP      Full           Yes      Yes                                 +---------+---------------+---------+-----------+----------+--------------+ PTV      Full                                                        +---------+---------------+---------+-----------+----------+--------------+  PERO     Full                                                        +---------+---------------+---------+-----------+----------+--------------+     Summary: RIGHT: - There is no evidence of deep vein thrombosis in the lower extremity.  - No cystic structure found in the popliteal fossa.  LEFT: - There is no evidence of deep vein thrombosis in the lower extremity.  - No cystic structure found in  the popliteal fossa.  *See table(s) above for measurements and observations. Electronically signed by Norman Serve on 08/22/2024 at 10:10:53 AM.    Final    CT CHEST WO CONTRAST Result Date: 08/21/2024 EXAM: CT CHEST WITHOUT CONTRAST 08/21/2024 09:47:29 AM TECHNIQUE: CT of the chest was performed without the administration of intravenous contrast. Multiplanar reformatted images are provided for review. Automated exposure control, iterative reconstruction, and/or weight based adjustment of the mA/kV was utilized to reduce the radiation dose to as low as reasonably achievable. COMPARISON: CT chest, abdomen, and pelvis 08/12/2024. CLINICAL HISTORY: 69 year old female with hypoxia, worsening left lung infiltrate. Midthoracic esophageal diverticulum resection last month with persistent esophageal perforation, stent placement. Right middle lobectomy. Progressive lung opacification. FINDINGS: MEDIASTINUM: Heart and pericardium: No cardiomegaly or pericardial effusion. Calcified atherosclerosis of the aorta. The central airways are clear. Esophagus: Metal esophageal stent in place from the level of the thoracic inlet to the distal third of the esophagus, gas filled. Distal esophagus nondilated but does appear mildly thickened. LYMPH NODES: No mediastinal, hilar or axillary lymphadenopathy. LUNGS AND PLEURA: Postoperative changes at the right hilum and lung compatible with right middle lobectomy , stable. Right chest tube in place, located along the posterior and medial mediastinum. New from prior CT: Widespread bilateral upper lobes predominant crazy paving pattern of pulmonary opacity with both septal thickening and extensive ground glass (series 5 image 47). Superimposed small layering left pleural effusion. Trace right pleural effusion. No pneumothorax. Left lower lobe predominant but also scattered amongst the other pulmonary opacity: Peripheral solid, confluent peribronchial opacity although no air bronchograms.  SOFT TISSUES/BONES: Stable occasional mild thoracic superior endplate compression (such as at T3 and T4). Underlying osteopenia. No acute abnormality of the soft tissues. UPPER ABDOMEN: Limited images of the upper abdomen demonstrates dilute oral contrast in normal appearing transverse colon. Negative visible other non-contrast upper abdominal viscera. IMPRESSION: 1. Widespread bilateral upper lobe predominant lung crazy paving opacity. Superimposed left lower lobe predominant solid and confluent peribronchial opacity. Small layering pleural effusions left greater than right. And right chest tube in place with no adverse features. Top differential considerations include edema, ARDS, drug reaction, infection (mycoplasma, COVID, other viral / atypical etiologies, less likely aspiration). 2. Metal esophageal stent with no adverse features. Electronically signed by: Helayne Hurst MD 08/21/2024 10:56 AM EST RP Workstation: [id number]    Medications: Infusions:  acetaminophen      ceFEPime  (MAXIPIME ) IV Stopped (08/22/24 1037)   clevidipine  6 mg/hr (08/23/24 0704)   famotidine  (PEPCID ) IV     fluconazole  (DIFLUCAN ) IV     linezolid  (ZYVOX ) IV Stopped (08/22/24 2251)   metronidazole  Stopped (08/22/24 2250)   promethazine  (PHENERGAN ) injection (IM or IVPB) Stopped (08/19/24 1554)   TPN ADULT (ION) 60 mL/hr at 08/23/24 0704    Scheduled Medications:  Chlorhexidine  Gluconate Cloth  6 each Topical Daily  fentaNYL   1 patch Transdermal Q72H   heparin  injection (subcutaneous)  5,000 Units Subcutaneous Q8H   insulin  aspart  0-20 Units Subcutaneous Q4H   insulin  glargine  20 Units Subcutaneous Daily   ipratropium-albuterol   3 mL Nebulization TID   lidocaine   2 patch Transdermal Q24H   neomycin -bacitracin -polymyxin   Topical BID   nitroGLYCERIN   0.3 mg Transdermal Daily   ondansetron  (ZOFRAN ) IV  4 mg Intravenous Q6H   sodium chloride  flush  10-40 mL Intracatheter Q12H    have reviewed scheduled and prn  medications.  Physical Exam: General: Sitting on bed, mild increased work of breathing Heart:RRR, s1s2 nl Lungs: Diffuse Rales and crackles. Abdomen:soft, Non-tender, non-distended Extremities:No edema Neurology: Alert and awake.  Daymion Nazaire Prasad Taiylor Virden 08/23/2024,9:23 AM  LOS: 28 days

## 2024-08-23 NOTE — Progress Notes (Signed)
 7 Days Post-Op Procedures (LRB): EGD (ESOPHAGOGASTRODUODENOSCOPY) (N/A) CONTROL OF HEMORRHAGE, GI TRACT, ENDOSCOPIC, BY CLIPPING OR OVERSEWING Subjective: C/o beng thirsty  Objective: Vital signs in last 24 hours: Temp:  [99.9 F (37.7 C)-101.1 F (38.4 C)] 99.9 F (37.7 C) (01/26 1430) Pulse Rate:  [101-131] 115 (01/26 1430) Cardiac Rhythm: Sinus tachycardia (01/26 1230) Resp:  [16-38] 24 (01/26 1430) BP: (124-179)/(38-146) 151/65 (01/26 1430) SpO2:  [79 %-100 %] 99 % (01/26 1430) FiO2 (%):  [100 %] 100 % (01/25 1925) Weight:  [76.6 kg] 76.6 kg (01/26 0426)  Hemodynamic parameters for last 24 hours: CVP:  [3 mmHg-32 mmHg] 4 mmHg  Intake/Output from previous day: 01/25 0701 - 01/26 0700 In: 2724.2 [I.V.:1464.1; IV Piggyback:1260.2] Out: 2485 [Urine:2485] Intake/Output this shift: Total I/O In: 2167.4 [P.O.:220; I.V.:822.1; IV Piggyback:1125.3] Out: 768 [Urine:758; Chest Tube:10]  General appearance: alert and mild distress Neurologic: intact Heart: tachy, regular Lungs: rales bilaterally Abdomen: normal findings: soft, non-tender Wound: intact, irritation at CT site  Lab Results: Recent Labs    08/22/24 0222 08/23/24 0415  WBC 15.8* 21.7*  HGB 6.8* 8.5*  HCT 20.1* 25.0*  PLT 138* 180   BMET:  Recent Labs    08/22/24 0222 08/23/24 0415  NA 137 143  K 3.8 3.9  CL 100 106  CO2 22 23  GLUCOSE 146* 128*  BUN 91* 90*  CREATININE 3.51* 3.78*  CALCIUM 8.2* 8.9    PT/INR: No results for input(s): LABPROT, INR in the last 72 hours. ABG    Component Value Date/Time   TCO2 25 11/10/2009 0952   CBG (last 3)  Recent Labs    08/23/24 0340 08/23/24 0826 08/23/24 1133  GLUCAP 125* 119* 173*    Assessment/Plan: S/P Procedures (LRB): EGD (ESOPHAGOGASTRODUODENOSCOPY) (N/A) CONTROL OF HEMORRHAGE, GI TRACT, ENDOSCOPIC, BY CLIPPING OR OVERSEWING - NEURO- continues to experience anxiety CV- hypertensive and mildly tachycardic  Cleviprex  changed to  Nicardipine  drip RESP- acute hypoxic respiratory failure requiring NRB  CT shows diffuse opacities upper lobe predominant with left lower lobe consolidation  Requiring NRB but not in distress and has avoided vent so far  CCM managing  On linezolid , cefepime , metronidazole  and fluconazole   WBC 21 K RENAL- creatinine slowly rising but Uo has increased  Agree she is dry- for LR bolus today GI- NPO again, stent in place  Will let her have water and ice chips for comfort Nutrition- on TNA Deconditioning- severe, mobilize as tolerated Scd + SQ heparin    LOS: 28 days    [name] 08/23/2024

## 2024-08-24 ENCOUNTER — Ambulatory Visit: Payer: Self-pay | Admitting: Thoracic Surgery (Cardiothoracic Vascular Surgery)

## 2024-08-24 ENCOUNTER — Ambulatory Visit (HOSPITAL_COMMUNITY): Payer: Self-pay

## 2024-08-24 ENCOUNTER — Inpatient Hospital Stay (HOSPITAL_COMMUNITY)

## 2024-08-24 DIAGNOSIS — J80 Acute respiratory distress syndrome: Secondary | ICD-10-CM

## 2024-08-24 DIAGNOSIS — J9601 Acute respiratory failure with hypoxia: Secondary | ICD-10-CM

## 2024-08-24 DIAGNOSIS — J189 Pneumonia, unspecified organism: Secondary | ICD-10-CM

## 2024-08-24 LAB — GLUCOSE, CAPILLARY
Glucose-Capillary: 208 mg/dL — ABNORMAL HIGH (ref 70–99)
Glucose-Capillary: 214 mg/dL — ABNORMAL HIGH (ref 70–99)
Glucose-Capillary: 218 mg/dL — ABNORMAL HIGH (ref 70–99)
Glucose-Capillary: 252 mg/dL — ABNORMAL HIGH (ref 70–99)
Glucose-Capillary: 285 mg/dL — ABNORMAL HIGH (ref 70–99)
Glucose-Capillary: 287 mg/dL — ABNORMAL HIGH (ref 70–99)

## 2024-08-24 LAB — BASIC METABOLIC PANEL WITH GFR
Anion gap: 14 (ref 5–15)
BUN: 94 mg/dL — ABNORMAL HIGH (ref 8–23)
CO2: 21 mmol/L — ABNORMAL LOW (ref 22–32)
Calcium: 8.9 mg/dL (ref 8.9–10.3)
Chloride: 106 mmol/L (ref 98–111)
Creatinine, Ser: 3.79 mg/dL — ABNORMAL HIGH (ref 0.44–1.00)
GFR, Estimated: 12 mL/min — ABNORMAL LOW
Glucose, Bld: 247 mg/dL — ABNORMAL HIGH (ref 70–99)
Potassium: 5 mmol/L (ref 3.5–5.1)
Sodium: 141 mmol/L (ref 135–145)

## 2024-08-24 LAB — POCT I-STAT 7, (LYTES, BLD GAS, ICA,H+H)
Acid-base deficit: 6 mmol/L — ABNORMAL HIGH (ref 0.0–2.0)
Acid-base deficit: 6 mmol/L — ABNORMAL HIGH (ref 0.0–2.0)
Acid-base deficit: 6 mmol/L — ABNORMAL HIGH (ref 0.0–2.0)
Acid-base deficit: 9 mmol/L — ABNORMAL HIGH (ref 0.0–2.0)
Bicarbonate: 23.2 mmol/L (ref 20.0–28.0)
Bicarbonate: 22.1 mmol/L (ref 20.0–28.0)
Bicarbonate: 20.8 mmol/L (ref 20.0–28.0)
Bicarbonate: 17 mmol/L — ABNORMAL LOW (ref 20.0–28.0)
Calcium, Ion: 1.24 mmol/L (ref 1.15–1.40)
Calcium, Ion: 1.24 mmol/L (ref 1.15–1.40)
Calcium, Ion: 1.18 mmol/L (ref 1.15–1.40)
Calcium, Ion: 1.2 mmol/L (ref 1.15–1.40)
HCT: 24 % — ABNORMAL LOW (ref 36.0–46.0)
HCT: 24 % — ABNORMAL LOW (ref 36.0–46.0)
HCT: 24 % — ABNORMAL LOW (ref 36.0–46.0)
HCT: 21 % — ABNORMAL LOW (ref 36.0–46.0)
Hemoglobin: 8.2 g/dL — ABNORMAL LOW (ref 12.0–15.0)
Hemoglobin: 8.2 g/dL — ABNORMAL LOW (ref 12.0–15.0)
Hemoglobin: 8.2 g/dL — ABNORMAL LOW (ref 12.0–15.0)
Hemoglobin: 7.1 g/dL — ABNORMAL LOW (ref 12.0–15.0)
O2 Saturation: 85 %
O2 Saturation: 94 %
O2 Saturation: 98 %
O2 Saturation: 97 %
Patient temperature: 37.4
Patient temperature: 37.3
Patient temperature: 37.3
Patient temperature: 37.9
Potassium: 4.4 mmol/L (ref 3.5–5.1)
Potassium: 4.6 mmol/L (ref 3.5–5.1)
Potassium: 4.5 mmol/L (ref 3.5–5.1)
Potassium: 3.9 mmol/L (ref 3.5–5.1)
Sodium: 140 mmol/L (ref 135–145)
Sodium: 140 mmol/L (ref 135–145)
Sodium: 138 mmol/L (ref 135–145)
Sodium: 138 mmol/L (ref 135–145)
TCO2: 25 mmol/L (ref 22–32)
TCO2: 24 mmol/L (ref 22–32)
TCO2: 22 mmol/L (ref 22–32)
TCO2: 18 mmol/L — ABNORMAL LOW (ref 22–32)
pCO2 arterial: 74.1 mmHg (ref 32–48)
pCO2 arterial: 61.3 mmHg — ABNORMAL HIGH (ref 32–48)
pCO2 arterial: 51.2 mmHg — ABNORMAL HIGH (ref 32–48)
pCO2 arterial: 36.4 mmHg (ref 32–48)
pH, Arterial: 7.106 — CL (ref 7.35–7.45)
pH, Arterial: 7.166 — CL (ref 7.35–7.45)
pH, Arterial: 7.218 — ABNORMAL LOW (ref 7.35–7.45)
pH, Arterial: 7.283 — ABNORMAL LOW (ref 7.35–7.45)
pO2, Arterial: 70 mmHg — ABNORMAL LOW (ref 83–108)
pO2, Arterial: 91 mmHg (ref 83–108)
pO2, Arterial: 132 mmHg — ABNORMAL HIGH (ref 83–108)
pO2, Arterial: 103 mmHg (ref 83–108)

## 2024-08-24 LAB — BODY FLUID CELL COUNT WITH DIFFERENTIAL
Eos, Fluid: 1 %
Lymphs, Fluid: 4 %
Monocyte-Macrophage-Serous Fluid: 55 % (ref 50–90)
Neutrophil Count, Fluid: 40 % — ABNORMAL HIGH (ref 0–25)
Total Nucleated Cell Count, Fluid: 200 uL (ref 0–1000)

## 2024-08-24 LAB — POCT I-STAT EG7
Acid-base deficit: 8 mmol/L — ABNORMAL HIGH (ref 0.0–2.0)
Bicarbonate: 22.1 mmol/L (ref 20.0–28.0)
Calcium, Ion: 1.26 mmol/L (ref 1.15–1.40)
HCT: 24 % — ABNORMAL LOW (ref 36.0–46.0)
Hemoglobin: 8.2 g/dL — ABNORMAL LOW (ref 12.0–15.0)
O2 Saturation: 85 %
Patient temperature: 37.8
Potassium: 4 mmol/L (ref 3.5–5.1)
Sodium: 142 mmol/L (ref 135–145)
TCO2: 24 mmol/L (ref 22–32)
pCO2, Ven: 79.8 mmHg (ref 44–60)
pH, Ven: 7.055 — CL (ref 7.25–7.43)
pO2, Ven: 76 mmHg — ABNORMAL HIGH (ref 32–45)

## 2024-08-24 LAB — CBC
HCT: 25.3 % — ABNORMAL LOW (ref 36.0–46.0)
HCT: 20.9 % — ABNORMAL LOW (ref 36.0–46.0)
Hemoglobin: 8.8 g/dL — ABNORMAL LOW (ref 12.0–15.0)
Hemoglobin: 7.1 g/dL — ABNORMAL LOW (ref 12.0–15.0)
MCH: 33 pg (ref 26.0–34.0)
MCH: 31.3 pg (ref 26.0–34.0)
MCHC: 34.8 g/dL (ref 30.0–36.0)
MCHC: 34 g/dL (ref 30.0–36.0)
MCV: 94.8 fL (ref 80.0–100.0)
MCV: 92.1 fL (ref 80.0–100.0)
Platelets: 276 10*3/uL (ref 150–400)
Platelets: 186 10*3/uL (ref 150–400)
RBC: 2.67 MIL/uL — ABNORMAL LOW (ref 3.87–5.11)
RBC: 2.27 MIL/uL — ABNORMAL LOW (ref 3.87–5.11)
RDW: 17 % — ABNORMAL HIGH (ref 11.5–15.5)
RDW: 16.6 % — ABNORMAL HIGH (ref 11.5–15.5)
WBC: 29.5 10*3/uL — ABNORMAL HIGH (ref 4.0–10.5)
WBC: 21.5 10*3/uL — ABNORMAL HIGH (ref 4.0–10.5)
nRBC: 0 % (ref 0.0–0.2)
nRBC: 0 % (ref 0.0–0.2)

## 2024-08-24 LAB — RESPIRATORY PANEL BY PCR

## 2024-08-24 LAB — PHOSPHORUS: Phosphorus: 5.2 mg/dL — ABNORMAL HIGH (ref 2.5–4.6)

## 2024-08-24 LAB — CULTURE, RESPIRATORY W GRAM STAIN: Culture: NORMAL

## 2024-08-24 LAB — PROCALCITONIN: Procalcitonin: 1.07 ng/mL

## 2024-08-24 LAB — MAGNESIUM: Magnesium: 2.4 mg/dL (ref 1.7–2.4)

## 2024-08-24 MED ORDER — SODIUM BICARBONATE 8.4 % IV SOLN
INTRAVENOUS | Status: AC
  Filled 2024-08-24: qty 50

## 2024-08-24 MED ORDER — ETOMIDATE 2 MG/ML IV SOLN
INTRAVENOUS | Status: AC
  Administered 2024-08-24: 20 mg
  Filled 2024-08-24: qty 20

## 2024-08-24 MED ORDER — PROPOFOL 1000 MG/100ML IV EMUL
0.0000 ug/kg/min | INTRAVENOUS | Status: DC
  Administered 2024-08-24: 30 ug/kg/min via INTRAVENOUS
  Administered 2024-08-24: 5 ug/kg/min via INTRAVENOUS
  Filled 2024-08-24: qty 100

## 2024-08-24 MED ORDER — SODIUM BICARBONATE 8.4 % IV SOLN
100.0000 meq | Freq: Once | INTRAVENOUS | Status: AC
  Administered 2024-08-24: 100 meq via INTRAVENOUS
  Filled 2024-08-24: qty 50

## 2024-08-24 MED ORDER — FENTANYL 2500MCG IN NS 250ML (10MCG/ML) PREMIX INFUSION
0.0000 ug/h | INTRAVENOUS | Status: DC
  Administered 2024-08-24: 50 ug/h via INTRAVENOUS
  Administered 2024-08-24: 175 ug/h via INTRAVENOUS
  Administered 2024-08-25 (×2): 225 ug/h via INTRAVENOUS
  Administered 2024-08-26: 250 ug/h via INTRAVENOUS
  Administered 2024-08-26: 275 ug/h via INTRAVENOUS
  Administered 2024-08-26: 250 ug/h via INTRAVENOUS
  Administered 2024-08-27: 300 ug/h via INTRAVENOUS
  Administered 2024-08-27: 150 ug/h via INTRAVENOUS
  Filled 2024-08-24 (×9): qty 250

## 2024-08-24 MED ORDER — TRACE MINERALS CU-MN-SE-ZN 300-55-60-3000 MCG/ML IV SOLN
INTRAVENOUS | Status: AC
  Filled 2024-08-24: qty 666.87

## 2024-08-24 MED ORDER — FENTANYL BOLUS VIA INFUSION
25.0000 ug | INTRAVENOUS | Status: DC | PRN
  Administered 2024-08-24: 100 ug via INTRAVENOUS
  Administered 2024-08-24: 50 ug via INTRAVENOUS
  Administered 2024-08-24: 100 ug via INTRAVENOUS
  Administered 2024-08-24: 50 ug via INTRAVENOUS
  Administered 2024-08-24: 25 ug via INTRAVENOUS
  Administered 2024-08-24: 50 ug via INTRAVENOUS
  Administered 2024-08-24 (×2): 25 ug via INTRAVENOUS
  Administered 2024-08-24 – 2024-08-25 (×4): 100 ug via INTRAVENOUS
  Administered 2024-08-25: 50 ug via INTRAVENOUS
  Administered 2024-08-25: 100 ug via INTRAVENOUS
  Administered 2024-08-25: 50 ug via INTRAVENOUS
  Administered 2024-08-25 (×4): 100 ug via INTRAVENOUS
  Administered 2024-08-25: 50 ug via INTRAVENOUS
  Administered 2024-08-26: 100 ug via INTRAVENOUS
  Administered 2024-08-26 (×3): 50 ug via INTRAVENOUS
  Administered 2024-08-27: 100 ug via INTRAVENOUS
  Administered 2024-08-27: 50 ug via INTRAVENOUS
  Administered 2024-08-27 (×2): 100 ug via INTRAVENOUS

## 2024-08-24 MED ORDER — SODIUM CHLORIDE 0.9 % IV SOLN
100.0000 mg | Freq: Every day | INTRAVENOUS | Status: DC
  Administered 2024-08-24 – 2024-09-03 (×11): 100 mg via INTRAVENOUS
  Filled 2024-08-24 (×11): qty 5

## 2024-08-24 MED ORDER — CLONIDINE HCL 0.2 MG/24HR TD PTWK
0.2000 mg | MEDICATED_PATCH | TRANSDERMAL | Status: DC
  Administered 2024-08-24: 0.2 mg via TRANSDERMAL
  Filled 2024-08-24: qty 1

## 2024-08-24 MED ORDER — HYDRALAZINE HCL 20 MG/ML IJ SOLN
10.0000 mg | INTRAMUSCULAR | Status: DC | PRN
  Administered 2024-08-24 – 2024-08-28 (×2): 10 mg via INTRAVENOUS
  Filled 2024-08-24 (×2): qty 1

## 2024-08-24 MED ORDER — HYDRALAZINE HCL 20 MG/ML IJ SOLN
10.0000 mg | Freq: Four times a day (QID) | INTRAMUSCULAR | Status: DC
  Administered 2024-08-24: 10 mg via INTRAVENOUS
  Filled 2024-08-24 (×2): qty 1

## 2024-08-24 MED ORDER — INSULIN ASPART 100 UNIT/ML IJ SOLN
5.0000 [IU] | Freq: Once | INTRAMUSCULAR | Status: AC
  Administered 2024-08-24: 5 [IU] via SUBCUTANEOUS
  Filled 2024-08-24: qty 5

## 2024-08-24 MED ORDER — ROCURONIUM BROMIDE 10 MG/ML (PF) SYRINGE
PREFILLED_SYRINGE | INTRAVENOUS | Status: AC
  Administered 2024-08-24: 70 mg
  Filled 2024-08-24: qty 10

## 2024-08-24 MED ORDER — TRACE MINERALS CU-MN-SE-ZN 300-55-60-3000 MCG/ML IV SOLN: INTRAVENOUS | Status: DC

## 2024-08-24 MED ORDER — MIDAZOLAM-SODIUM CHLORIDE 100-0.9 MG/100ML-% IV SOLN
0.0000 mg/h | INTRAVENOUS | Status: DC
  Administered 2024-08-24: 2 mg/h via INTRAVENOUS
  Administered 2024-08-25: 4 mg/h via INTRAVENOUS
  Administered 2024-08-26: 5 mg/h via INTRAVENOUS
  Filled 2024-08-24 (×3): qty 100

## 2024-08-24 MED ORDER — MIDAZOLAM HCL 2 MG/2ML IJ SOLN
INTRAMUSCULAR | Status: AC
  Administered 2024-08-24: 2 mg
  Filled 2024-08-24: qty 2

## 2024-08-24 MED ORDER — ACETAMINOPHEN 10 MG/ML IV SOLN
1000.0000 mg | Freq: Four times a day (QID) | INTRAVENOUS | Status: DC
  Administered 2024-08-24 – 2024-08-25 (×3): 1000 mg via INTRAVENOUS
  Filled 2024-08-24 (×2): qty 100

## 2024-08-24 MED ORDER — SENNA 8.6 MG PO TABS: 1.0000 | ORAL_TABLET | Freq: Two times a day (BID) | ORAL | Status: DC

## 2024-08-24 MED ORDER — MIDAZOLAM BOLUS VIA INFUSION
0.0000 mg | INTRAVENOUS | Status: DC | PRN
  Administered 2024-08-24: 1 mg via INTRAVENOUS
  Administered 2024-08-24: 2 mg via INTRAVENOUS
  Administered 2024-08-24: 1 mg via INTRAVENOUS
  Administered 2024-08-24 (×2): 2 mg via INTRAVENOUS
  Administered 2024-08-24: 1 mg via INTRAVENOUS
  Administered 2024-08-24: 2 mg via INTRAVENOUS
  Administered 2024-08-24: 1 mg via INTRAVENOUS
  Administered 2024-08-25: 2 mg via INTRAVENOUS
  Administered 2024-08-25: 1 mg via INTRAVENOUS
  Administered 2024-08-25: 4 mg via INTRAVENOUS
  Administered 2024-08-25 (×2): 2 mg via INTRAVENOUS
  Administered 2024-08-26: 4 mg via INTRAVENOUS

## 2024-08-24 MED ORDER — FENTANYL CITRATE (PF) 50 MCG/ML IJ SOSY
PREFILLED_SYRINGE | INTRAMUSCULAR | Status: AC
  Administered 2024-08-24: 50 ug
  Filled 2024-08-24: qty 2

## 2024-08-24 MED ORDER — POLYETHYLENE GLYCOL 3350 17 G PO PACK: 17.0000 g | PACK | Freq: Every day | ORAL | Status: DC

## 2024-08-24 MED ORDER — METOPROLOL TARTRATE 5 MG/5ML IV SOLN
5.0000 mg | INTRAVENOUS | Status: AC | PRN
  Administered 2024-08-24 – 2024-09-02 (×9): 5 mg via INTRAVENOUS
  Filled 2024-08-24 (×11): qty 5

## 2024-08-24 MED ORDER — INSULIN GLARGINE 100 UNIT/ML ~~LOC~~ SOLN
26.0000 [IU] | Freq: Every day | SUBCUTANEOUS | Status: DC
  Administered 2024-08-24: 26 [IU] via SUBCUTANEOUS
  Filled 2024-08-24 (×2): qty 0.26

## 2024-08-24 MED ORDER — METHYLPREDNISOLONE SODIUM SUCC 125 MG IJ SOLR
125.0000 mg | Freq: Once | INTRAMUSCULAR | Status: AC
  Administered 2024-08-24: 125 mg via INTRAVENOUS
  Filled 2024-08-24: qty 2

## 2024-08-24 MED ORDER — PROPOFOL 1000 MG/100ML IV EMUL
INTRAVENOUS | Status: AC
  Filled 2024-08-24: qty 100

## 2024-08-24 NOTE — Progress Notes (Signed)
 PHARMACY - TOTAL PARENTERAL NUTRITION CONSULT NOTE  Indication: Esophageal leak  Patient Measurements: Height: 5' 3 (160 cm) Weight: 80.6 kg (177 lb 11.1 oz) IBW/kg (Calculated) : 52.4 TPN AdjBW (KG): 55.8 Body mass index is 31.48 kg/m.  Assessment:  89 YOF with NSCLC here for RML lobectomy, resection of esophageal diverticulum, and lymph node dissection on 12/29.  Found to have a small contained leak post-op.   Patient reports an intentional weight loss of 40 lbs over a couple of years when she was diagnosed with DM.  She typically drinks one Orgain shake every morning for breakfast and eats one meal a day, which could be pizza, spaghetti, chicken, beef or fish along with vegetables and fruits.  Patient was eating normally until her planned surgery. RD concerned with patient being malnourished. Patient was on TPN 12/31 - 1/12 and was advanced to dysphagia 1 diet. However, she was NPO again 1/14-1/16. On 1/17 IR reported small amount of leak from distal esophageal stent. Patient developed AKI since TPN was held. Pharmacy reconsulted for TPN 1/17.  Nephrology following. Serologies negative. No RRT for now. Maintaing urine output however BUN is steadily climbing. Patient intubated overnight on 1/27. Currently on propofol  and cleviprex . Trial of nicardipine  on 1/26 without adequate BP control. Swapping propofol  to midazolam  to mediate kcal load from lipid containing IV products. Potential for Jtube / PEG at the end of the week per TCTS.   Glucose / Insulin : hx DM, A1c 5.9% - BG 102-252-177 improved, 18 units SSI/24h, glargine 20 units/24hr per TCTS, 20 units insulin  in TPN (increased 1/25), s/p solu-medrol  125mg  overnight  Electrolytes: Na 141 (from 137), K 5.0 from 3.8 (decreased in TPN on 1/26). CL 106, CO2 21, Phos: 5.2, Mg: 2.4, CoCa: 10.0 Renal: AKI - SCr 3.79 up (BL 0.8), BUN 94 Hepatic: 1/26- LFTs / tbili WNL, albumin  2.6, TG 172 1/26 (noted clevidipine  added on 1/26)  Intake / Output;  MIVF: UOP (0.7 ml/kg/hr), LBM 1/26, chest tube 10 mL - last lasix  1/24, LR bolus x1 overnight  Net IO Since Admission: 21,188.04 mL [08/24/24 0810]  GI Imaging:   12/30 esophagram: Positive for contrast extravasation and an esophageal leak in the mid esophagus 1/2 esophagram: Persistent contrast extravasation and esophageal leak, similar to prior 1/6 esophagram: Limited contrast study due to patient aspiration and severe coughing spell. Study aborted. 1/12 esophagram: Patent esophageal stent. No evidence for extravasation or leak, Delayed esophageal emptying and gastroesophageal reflux leading to stasis of contrast in the esophagus. 1/15 CT: displaced esophageal stent in stomach, no bowel obstruction  1/17 esophagram: small volume esophageal leak at distal stent 1/20 esophagram: no evidence of any leak  GI Surgeries / Procedures:  12/30 right middle lobectomy, resection esophageal diverticulum, lymph node biopsy  1/7 EGD- stent placement  1/9 EGD stent repositioned d/t migration to stomach  1/16 EGD- stent placed at esophageal ulcer, removed gastric stent, normal duoenum  1/19 EGD - stent replaced / repositioned   Central access: PICC 07/28/24 TPN start date: 07/28/24 > 1/12; 1/17 >>   Nutritional Goals:  Goal unconcentrated TPN rate 75 ml/hr to provide 101g AA and 1721 kCal Goal concentrated TPN rate 60 mL/hr to provide 101 g AA and 1743 kCal  RD Estimated Needs Total Energy Estimated Needs: 1700-1900 Total Protein Estimated Needs: 100-115g Total Fluid Estimated Needs: >1.7L/day  Current Nutrition:  TPN 1/17 >> 1/08 CLD - passed swallow 1/09 CLD - patient feels can't swallow well > NPO 1/13 advanced to DYS1  1/14 NPO  1/21 FLD, Glucerna x1 on 1/21, milkshake per patient  1/24 NPO   Plan: Will adjust TPN to meeting kcal needs while taking into account cleviprex . Cleviprex  infusing at 10.5mg /hr (2mL/hr) for the past several hours. Total volume in 24 hours = 504, =  ~ 1000kcal from cleviprex .  Will start TPN at 40mL/hr @ 18:00. Will provide 100g of protein and 800kcal. With cleviprex  will be meeting 100% of nutritional needs. Not max'ing out protein given climbing BUN.  Electrolytes in TPN: Na 25 mEq/L, K 10 mEq/L (= 9.6 mEq, decreased), Ca 3 mEq/L, remove Mg 0 mEq/L, remove Phos 0 mmol/L. Cl:Ac to max acetate.  Add standard MVI and trace elements to TPN Continue resistant SSI to q4h and adjust as needed Insulin  glargine increase to 26 units QD per TCTS  Remove insulin  from TPN given significant decrease in CHO load (237g >> 118g CHO). Also discussed that solu-medrol  will not be continuing, hopeful that CBG will level out in next 24 hours given decrease in CHO load in TPN + not continuing solu-medrol .  Monitor TPN labs on Mon/Thurs, and daily given AKI.   Thank you for allowing pharmacy to be a part of this patients care.  Powell Blush, PharmD, BCCCP  Clinical Pharmacist

## 2024-08-24 NOTE — Progress Notes (Addendum)
 TCTS DAILY ICU PROGRESS NOTE                   [address]            [place] [id number]          [phone]   8 Days Post-Op Procedures (LRB): EGD (ESOPHAGOGASTRODUODENOSCOPY) (N/A) CONTROL OF HEMORRHAGE, GI TRACT, ENDOSCOPIC, BY CLIPPING OR OVERSEWING 11 days s/p bronchoscopy         18 days s/p EGD, reposition esophageal stent 20 days s/p EGD, placement of esophageal stent 29 days s/p Xi RML, LN dissection, intercostal nerve block, resection of esophageal diverticulum  Total Length of Stay:  LOS: 29 days   Subjective: Patient intubated/sedated. Daughter at bedside this am.  Objective: Vital signs in last 24 hours: Temp:  [98.6 F (37 C)-100.6 F (38.1 C)] 98.8 F (37.1 C) (01/27 0815) Pulse Rate:  [99-134] 100 (01/27 0815) Cardiac Rhythm: Normal sinus rhythm;Sinus tachycardia (01/27 0800) Resp:  [0-38] 25 (01/27 0815) BP: (115-201)/(38-146) 115/50 (01/27 0800) SpO2:  [80 %-100 %] 100 % (01/27 0815) FiO2 (%):  [70 %-100 %] 70 % (01/27 0800) Weight:  [80.6 kg] 80.6 kg (01/27 0500)  Filed Weights   08/22/24 0500 08/23/24 0426 08/24/24 0500  Weight: 77.5 kg 76.6 kg 80.6 kg    Weight change: 4 kg   Hemodynamic parameters for last 24 hours: CVP:  [1 mmHg-28 mmHg] 9 mmHg  Intake/Output from previous day: 01/26 0701 - 01/27 0700 In: 5237.9 [P.O.:580; I.V.:3127.9; IV Piggyback:1519.9] Out: 1633 [Urine:1623; Chest Tube:10]  Intake/Output this shift: Total I/O In: 224.8 [I.V.:224.8] Out: 75 [Urine:75]  Current Meds: Scheduled Meds:  Chlorhexidine  Gluconate Cloth  6 each Topical Daily   fentaNYL   1 patch Transdermal Q72H   heparin  injection (subcutaneous)  5,000 Units Subcutaneous Q8H   insulin  aspart  0-20 Units Subcutaneous Q4H   insulin  glargine  26 Units Subcutaneous Daily   ipratropium-albuterol   3 mL Nebulization TID   lidocaine   2 patch Transdermal Q24H   neomycin -bacitracin -polymyxin   Topical BID   nitroGLYCERIN   0.3 mg Transdermal  Daily   sodium chloride  flush  10-40 mL Intracatheter Q12H   Continuous Infusions:  ceFEPime  (MAXIPIME ) IV Stopped (08/23/24 1108)   clevidipine  10.5 mg/hr (08/24/24 0800)   famotidine  (PEPCID ) IV Stopped (08/23/24 1141)   fentaNYL  infusion INTRAVENOUS 100 mcg/hr (08/24/24 0800)   linezolid  (ZYVOX ) IV Stopped (08/23/24 2253)   metronidazole  Stopped (08/23/24 2251)   micafungin  (MYCAMINE ) 100 mg in sodium chloride  0.9 % 100 mL IVPB     promethazine  (PHENERGAN ) injection (IM or IVPB) Stopped (08/19/24 1554)   propofol  (DIPRIVAN ) infusion 30 mcg/kg/min (08/24/24 0800)   TPN ADULT (ION) 60 mL/hr at 08/24/24 0800   PRN Meds:.bisacodyl , diphenhydrAMINE , hydrALAZINE , HYDROmorphone  (DILAUDID ) injection, levalbuterol , lip balm, LORazepam , ondansetron  (ZOFRAN ) IV, mouth rinse, phenol, promethazine  (PHENERGAN ) injection (IM or IVPB), sodium chloride  flush  General appearance: Ill appearing, intubated, sedated Heart: Slightly tachycardic Lungs: Coarse breath sounds bilaterally Abdomen: Soft, bowel sounds present Extremities: SCDs in place. Both hands swollen this am Wound: Anterior chest wound clean, dry, no sign of infection  Lab Results: CBC: Recent Labs    08/23/24 0415 08/23/24 1808 08/24/24 0420 08/24/24 0505 08/24/24 0616  WBC 21.7*  --  29.5*  --   --   HGB 8.5*   < > 8.8* 8.2* 8.2*  HCT 25.0*   < > 25.3* 24.0* 24.0*  PLT 180  --  276  --   --    < > =  values in this interval not displayed.   BMET:  Recent Labs    08/23/24 0415 08/23/24 1808 08/24/24 0419 08/24/24 0505 08/24/24 0616  NA 143   < > 141 140 138  K 3.9   < > 5.0 4.6 4.5  CL 106  --  106  --   --   CO2 23  --  21*  --   --   GLUCOSE 128*  --  247*  --   --   BUN 90*  --  94*  --   --   CREATININE 3.78*  --  3.79*  --   --   CALCIUM 8.9  --  8.9  --   --    < > = values in this interval not displayed.    CMET: Lab Results  Component Value Date   WBC 29.5 (H) 08/24/2024   HGB 8.2 (L) 08/24/2024   HCT  24.0 (L) 08/24/2024   PLT 276 08/24/2024   GLUCOSE 247 (H) 08/24/2024   TRIG 172 (H) 08/23/2024   ALT 24 08/23/2024   AST 40 08/23/2024   NA 138 08/24/2024   K 4.5 08/24/2024   CL 106 08/24/2024   CREATININE 3.79 (H) 08/24/2024   BUN 94 (H) 08/24/2024   CO2 21 (L) 08/24/2024   TSH 3.532 02/28/2022   INR 1.0 08/18/2024   HGBA1C 5.9 (H) 07/20/2024      PT/INR: No results for input(s): LABPROT, INR in the last 72 hours. Radiology: DG CHEST PORT 1 VIEW Result Date: 08/24/2024 EXAM: 1 VIEW(S) XRAY OF THE CHEST 08/24/2024 12:46:25 AM COMPARISON: 08/23/2024 CLINICAL HISTORY: Encounter for intubation. FINDINGS: LINES, TUBES AND DEVICES: Endotracheal tube in place with tip 3 cm above the carina. Right PICC in place with tip overlying the expected region of the distal superior vena cava. Esophageal stent noted. LUNGS AND PLEURA: Interval increase in patchy airspace and interstitial opacities throughout. Small bilateral pleural effusions. No pneumothorax. HEART AND MEDIASTINUM: No acute abnormality of the cardiac and mediastinal silhouettes. BONES AND SOFT TISSUES: No acute osseous abnormality. IMPRESSION: 1. Endotracheal tube tip 3 cm above the carina. 2. Right PICC tip overlies the distal superior vena cava. 3. Interval increase in patchy airspace and interstitial opacities throughout. 4. Small bilateral pleural effusions. Electronically signed by: Oneil Devonshire MD 08/24/2024 12:49 AM EST RP Workstation: [id number]   DG CHEST PORT 1 VIEW Result Date: 08/23/2024 EXAM: 1 VIEW(S) XRAY OF THE CHEST 08/23/2024 06:00:00 PM COMPARISON: 08/23/2024 CLINICAL HISTORY: Respiratory failure. ICD10: [id number] Respiratory failure (HCC). FINDINGS: LINES, TUBES AND DEVICES: Esophageal stent in upper to midthoracic esophagus, unchanged in position. Stable right PICC. LUNGS AND PLEURA: Unchanged bilateral airspace opacities. Possible small pleural effusions. No pneumothorax. HEART AND MEDIASTINUM: No acute abnormality of  the cardiac and mediastinal silhouettes. BONES AND SOFT TISSUES: No acute osseous abnormality. IMPRESSION: 1. Unchanged bilateral airspace opacities and possible small pleural effusions. 2. Esophageal stent in the upper to midthoracic esophagus, unchanged in position. 3. Stable right PICC. Electronically signed by: Oneil Devonshire MD 08/23/2024 06:14 PM EST RP Workstation: [id number]     Assessment/Plan: S/P Procedures (LRB): EGD (ESOPHAGOGASTRODUODENOSCOPY) (N/A) CONTROL OF HEMORRHAGE, GI TRACT, ENDOSCOPIC, BY CLIPPING OR OVERSEWING 1. CV - ST this am. BP well controlled. On Cleviprex  drip,  Hydralazine  10 mg IV Q 6 PRN,  Nitroglycerin  patch 0.3 mg, and Procardia  XL 60 mg daily. 2.  Pulmonary - Suspected to be multifactorial with pneumonia versus pneumonitis/acute lung injury and possibly superimposed pulmonary edema. Re intubated last  night.  Last ABG: PH=7.166, PCO2=61.3, PO2=91, HCO3=22.1. Chest tube is to water seal, light yellowish drainage. Chest tube with scant output. CXR this am shows increase in patchy airspace disease/interstitial opacities, and small bilateral pleural effusions. She will have a bronchoscopy later today. Appreciate pulmonary/CCM's assistance 4. CBGs 252/214/155. On TPN so continue accu checks. 5. Anemia-H and H this am stable at 8.2 and 24 6. ID-on Cefepime , Zyvox , Micafungin , Flagyl   for esophageal leak, aspiration PNA. Had fever to 100.8. WBC this am increased to 29,900.  7. GI-NPO, TPN. May need PEG/J tube later in the week 8. History of tobacco abuse-continue Nicotine  patch 9. AKI-Suspected to have ATN secondary to ischemic and nephrotoxic etiologies. creatinine this am slightly increased to 3.79. Nephrology following and will determine if/when needs HD  Kyla HERO ZimmermanPA-C 08/24/2024 8:31 AM

## 2024-08-24 NOTE — Progress Notes (Signed)
 [facility] NEPHROLOGY PROGRESS NOTE  Assessment/ Plan: Pt is a 69 y.o. yo female    # Acute kidney Injury: Suspected to have ATN secondary to ischemic and nephrotoxic etiologies.  Serologies negative for GN including vasculitis/anti-GBM given previous concerns of gross hematuria.  Likely ATN with continued worsening of renal function in the setting of need for diuresis for volume unloading.  She is nonoliguric and creatinine level remains stable.  No acute indication for renal replacement therapy however the daughter understands that she may need dialysis if renal function worsen. - Strict ins and out and daily lab.  # Esophageal leak following resection of esophageal diverticulum: Underwent placement of a new esophageal stent previous one migrated distally into the stomach.  EGD and esophagram without evidence of leak.   # Hypertensive urgency: Required Cleviprex  IV, monitor BP.  # Anemia: Requiring blood transfusion, monitor hemoglobin.    # Acute hypoxic respiratory failure: Suspected to be multifactorial with pneumonia versus pneumonitis/acute lung injury and possibly superimposed pulmonary edema.  On broad-spectrum antibiotics with cefepime /fluconazole /linezolid  as well as ongoing diuretic.  Intubated on 1/27.  Subjective: Seen and examined in ICU.  Intubated overnight.  Urine output is around 1.6 L.  Daughter at bedside.  Discussed with ICU team.  No other new event. Objective Vital signs in last 24 hours: Vitals:   08/24/24 0830 08/24/24 0845 08/24/24 0854 08/24/24 0856  BP:   (!) 123/54   Pulse: (!) 104 (!) 101  100  Resp: (!) 35 (!) 23 (!) 36 17  Temp: 99 F (37.2 C) 99 F (37.2 C)  99 F (37.2 C)  TempSrc:      SpO2: 100% 100% 100% 100%  Weight:      Height:       Weight change: 4 kg  Intake/Output Summary (Last 24 hours) at 08/24/2024 0932 Last data filed at 08/24/2024 0900 Gross per 24 hour  Intake 5304.95 ml  Output 1513 ml  Net 3791.95 ml        Labs: RENAL PANEL Recent Labs  Lab 08/17/24 0958 08/18/24 0742 08/19/24 0654 08/20/24 0258 08/21/24 0549 08/21/24 0700 08/22/24 0222 08/23/24 0415 08/23/24 1808 08/24/24 0128 08/24/24 0337 08/24/24 0419 08/24/24 0505 08/24/24 0616  NA 143   < > 138 135 132*  --  137 143   < > 142 140 141 140 138  K 3.4*   < > 3.7 3.8 3.7  --  3.8 3.9   < > 4.0 4.4 5.0 4.6 4.5  CL 108   < > 101 97* 95*  --  100 106  --   --   --  106  --   --   CO2 23   < > 25 23 22   --  22 23  --   --   --  21*  --   --   GLUCOSE 226*   < > 158* 152* 163*  --  146* 128*  --   --   --  247*  --   --   BUN 58*   < > 66* 78* 84*  --  91* 90*  --   --   --  94*  --   --   CREATININE 2.72*   < > 2.66* 2.98* 3.25*  --  3.51* 3.78*  --   --   --  3.79*  --   --   CALCIUM 8.1*   < > 8.3* 7.9* 8.3*  --  8.2* 8.9  --   --   --  8.9  --   --   MG 2.0  --  1.5* 2.3  --   --   --  2.2  --   --   --  2.4  --   --   PHOS 2.1*   < > 3.1 3.7  --  4.3  --  3.5  --   --   --  5.2*  --   --   ALBUMIN   --   --  2.8*  --   --   --   --  2.6*  --   --   --   --   --   --    < > = values in this interval not displayed.    Liver Function Tests: Recent Labs  Lab 08/19/24 0654 08/23/24 0415  AST 37 40  ALT 24 24  ALKPHOS 47 104  BILITOT 0.4 0.3  PROT 5.6* 5.9*  ALBUMIN  2.8* 2.6*   No results for input(s): LIPASE, AMYLASE in the last 168 hours. No results for input(s): AMMONIA in the last 168 hours. CBC: Recent Labs    08/23/24 0415 08/23/24 1808 08/24/24 0128 08/24/24 0337 08/24/24 0420 08/24/24 0505 08/24/24 0616  HGB 8.5*   < > 8.2* 8.2* 8.8* 8.2* 8.2*  MCV 89.9  --   --   --  94.8  --   --    < > = values in this interval not displayed.    Cardiac Enzymes: No results for input(s): CKTOTAL, CKMB, CKMBINDEX, TROPONINI in the last 168 hours. CBG: Recent Labs  Lab 08/23/24 1657 08/23/24 2047 08/23/24 2343 08/24/24 0332 08/24/24 0834  GLUCAP 102* 140* 155* 214* 252*    Iron  Studies: No results for input(s): IRON, TIBC, TRANSFERRIN, FERRITIN in the last 72 hours. Studies/Results: DG CHEST PORT 1 VIEW Result Date: 08/24/2024 EXAM: 1 VIEW(S) XRAY OF THE CHEST 08/24/2024 12:46:25 AM COMPARISON: 08/23/2024 CLINICAL HISTORY: Encounter for intubation. FINDINGS: LINES, TUBES AND DEVICES: Endotracheal tube in place with tip 3 cm above the carina. Right PICC in place with tip overlying the expected region of the distal superior vena cava. Esophageal stent noted. LUNGS AND PLEURA: Interval increase in patchy airspace and interstitial opacities throughout. Small bilateral pleural effusions. No pneumothorax. HEART AND MEDIASTINUM: No acute abnormality of the cardiac and mediastinal silhouettes. BONES AND SOFT TISSUES: No acute osseous abnormality. IMPRESSION: 1. Endotracheal tube tip 3 cm above the carina. 2. Right PICC tip overlies the distal superior vena cava. 3. Interval increase in patchy airspace and interstitial opacities throughout. 4. Small bilateral pleural effusions. Electronically signed by: Oneil Devonshire MD 08/24/2024 12:49 AM EST RP Workstation: [id number]   DG CHEST PORT 1 VIEW Result Date: 08/23/2024 EXAM: 1 VIEW(S) XRAY OF THE CHEST 08/23/2024 06:00:00 PM COMPARISON: 08/23/2024 CLINICAL HISTORY: Respiratory failure. ICD10: [id number] Respiratory failure (HCC). FINDINGS: LINES, TUBES AND DEVICES: Esophageal stent in upper to midthoracic esophagus, unchanged in position. Stable right PICC. LUNGS AND PLEURA: Unchanged bilateral airspace opacities. Possible small pleural effusions. No pneumothorax. HEART AND MEDIASTINUM: No acute abnormality of the cardiac and mediastinal silhouettes. BONES AND SOFT TISSUES: No acute osseous abnormality. IMPRESSION: 1. Unchanged bilateral airspace opacities and possible small pleural effusions. 2. Esophageal stent in the upper to midthoracic esophagus, unchanged in position. 3. Stable right PICC. Electronically signed by: Oneil Devonshire MD  08/23/2024 06:14 PM EST RP Workstation: [id number]   DG CHEST PORT 1 VIEW Result Date: 08/23/2024 EXAM: 1 VIEW(S) XRAY OF THE CHEST 08/23/2024 05:45:24 AM COMPARISON: 08/21/2024 CLINICAL  HISTORY: Pneumonia. FINDINGS: LINES, TUBES AND DEVICES: Esophageal stent in place in proximal and mid esophagus. Right PICC line in place with tip in distal SVC. LUNGS AND PLEURA: Diffuse ground-glass opacities throughout left hemithorax and right upper lobe , not appreciably changed. Patchy airspace opacities scattered throughout bilaterally. Also unchanged in the interval. Small right pleural effusion. No pneumothorax. HEART AND MEDIASTINUM: No acute abnormality of the cardiac and mediastinal silhouettes. BONES AND SOFT TISSUES: No acute osseous abnormality. IMPRESSION: 1. No change in aeration to lungs compared with the previous exam. 2. Small right pleural effusion. Electronically signed by: Waddell Calk MD 08/23/2024 06:15 AM EST RP Workstation: [id number]   ECHOCARDIOGRAM COMPLETE Result Date: 08/22/2024    ECHOCARDIOGRAM REPORT   Patient Name:   [name] Date of Exam: 08/22/2024 Medical Rec #:  [id number]   Height:       63.0 in Accession #:    [id number]  Weight:       170.9 lb Date of Birth:  01-Apr-1956   BSA:          1.808 m Patient Age:    68 years    BP:           159/62 mmHg Patient Gender: F           HR:           112 bpm. Exam Location:  Inpatient Procedure: 2D Echo, Cardiac Doppler, Color Doppler and Intracardiac            Opacification Agent (Both Spectral and Color Flow Doppler were            utilized during procedure). Indications:    Abnormal ECG R94.31  History:        Patient has prior history of Echocardiogram examinations. Lung                 cancer; Risk Factors:Hypertension, Former Smoker and                 Dyslipidemia.  Sonographer:    Merlynn Argyle Referring Phys: LEITA SHAUNNA GASKINS  Sonographer Comments: Image acquisition challenging due to respiratory motion and tachycardia. IMPRESSIONS  1. Left  ventricular ejection fraction, by estimation, is 60 to 65%. The left ventricle has normal function. The left ventricle has no regional wall motion abnormalities. There is mild left ventricular hypertrophy. Indeterminate diastolic filling due to E-A fusion.  2. Right ventricular systolic function is normal. The right ventricular size is normal. Tricuspid regurgitation signal is inadequate for assessing PA pressure.  3. The mitral valve is normal in structure. No evidence of mitral valve regurgitation. No evidence of mitral stenosis.  4. The aortic valve is tricuspid. Aortic valve regurgitation is not visualized. No aortic stenosis is present.  5. The inferior vena cava is normal in size with greater than 50% respiratory variability, suggesting right atrial pressure of 3 mmHg. FINDINGS  Left Ventricle: Left ventricular ejection fraction, by estimation, is 60 to 65%. The left ventricle has normal function. The left ventricle has no regional wall motion abnormalities. Definity  contrast agent was given IV to delineate the left ventricular  endocardial borders. The left ventricular internal cavity size was normal in size. There is mild left ventricular hypertrophy. Indeterminate diastolic filling due to E-A fusion. Right Ventricle: The right ventricular size is normal. No increase in right ventricular wall thickness. Right ventricular systolic function is normal. Tricuspid regurgitation signal is inadequate for assessing PA pressure. Left Atrium: Left atrial size was normal in size.  Right Atrium: Right atrial size was normal in size. Pericardium: There is no evidence of pericardial effusion. Mitral Valve: The mitral valve is normal in structure. No evidence of mitral valve regurgitation. No evidence of mitral valve stenosis. Tricuspid Valve: The tricuspid valve is normal in structure. Tricuspid valve regurgitation is trivial. No evidence of tricuspid stenosis. Aortic Valve: The aortic valve is tricuspid. Aortic valve  regurgitation is not visualized. No aortic stenosis is present. Pulmonic Valve: The pulmonic valve was normal in structure. Pulmonic valve regurgitation is trivial. No evidence of pulmonic stenosis. Aorta: The aortic root is normal in size and structure. Venous: The inferior vena cava is normal in size with greater than 50% respiratory variability, suggesting right atrial pressure of 3 mmHg. IAS/Shunts: The interatrial septum was not well visualized.  LEFT VENTRICLE PLAX 2D LVIDd:         4.60 cm   Diastology LVIDs:         3.00 cm   LV e' medial:    9.36 cm/s LV PW:         1.10 cm   LV E/e' medial:  13.6 LV IVS:        1.10 cm   LV e' lateral:   18.30 cm/s LVOT diam:     2.00 cm   LV E/e' lateral: 6.9 LV SV:         63 LV SV Index:   35 LVOT Area:     3.14 cm  RIGHT VENTRICLE             IVC RV Basal diam:  3.20 cm     IVC diam: 1.50 cm RV S prime:     18.40 cm/s TAPSE (M-mode): 2.5 cm LEFT ATRIUM             Index        RIGHT ATRIUM           Index LA diam:        3.60 cm 1.99 cm/m   RA Area:     13.10 cm LA Vol (A2C):   43.6 ml 24.11 ml/m  RA Volume:   30.10 ml  16.64 ml/m LA Vol (A4C):   35.7 ml 19.74 ml/m LA Biplane Vol: 40.1 ml 22.17 ml/m  AORTIC VALVE LVOT Vmax:   107.00 cm/s LVOT Vmean:  73.700 cm/s LVOT VTI:    0.202 m  AORTA Ao Root diam: 3.50 cm Ao Asc diam:  3.00 cm MITRAL VALVE MV Area (PHT): 4.12 cm     SHUNTS MV Decel Time: 184 msec     Systemic VTI:  0.20 m MV E velocity: 127.00 cm/s  Systemic Diam: 2.00 cm MV A velocity: 98.50 cm/s MV E/A ratio:  1.29 Soyla Merck MD Electronically signed by Soyla Merck MD Signature Date/Time: 08/22/2024/12:14:31 PM    Final     Medications: Infusions:  ceFEPime  (MAXIPIME ) IV Stopped (08/23/24 1108)   clevidipine  10.5 mg/hr (08/24/24 0900)   famotidine  (PEPCID ) IV 10 mg (08/24/24 0915)   fentaNYL  infusion INTRAVENOUS 100 mcg/hr (08/24/24 0900)   linezolid  (ZYVOX ) IV Stopped (08/23/24 2253)   metronidazole  Stopped (08/23/24 2251)    micafungin  (MYCAMINE ) 100 mg in sodium chloride  0.9 % 100 mL IVPB     promethazine  (PHENERGAN ) injection (IM or IVPB) Stopped (08/19/24 1554)   propofol  (DIPRIVAN ) infusion 30 mcg/kg/min (08/24/24 0900)   TPN ADULT (ION) 60 mL/hr at 08/24/24 0900    Scheduled Medications:  Chlorhexidine  Gluconate Cloth  6 each Topical Daily  fentaNYL   1 patch Transdermal Q72H   heparin  injection (subcutaneous)  5,000 Units Subcutaneous Q8H   insulin  aspart  0-20 Units Subcutaneous Q4H   insulin  glargine  26 Units Subcutaneous Daily   ipratropium-albuterol   3 mL Nebulization TID   lidocaine   2 patch Transdermal Q24H   neomycin -bacitracin -polymyxin   Topical BID   nitroGLYCERIN   0.3 mg Transdermal Daily   sodium chloride  flush  10-40 mL Intracatheter Q12H    have reviewed scheduled and prn medications.  Physical Exam: General: Intubated, sedated. Heart:RRR, s1s2 nl Lungs: Diffuse Rales and crackles. Abdomen:soft, Non-tender, non-distended Extremities:No edema Neurology: Sedated.  Patrcia Schnepp Prasad George Haggart 08/24/2024,9:32 AM  LOS: 29 days

## 2024-08-24 NOTE — Procedures (Signed)
 Arterial Line Insertion Start/End1/27/2026 5:45 AM, 08/24/2024 6:00 AM  Patient location: ICU. Preanesthetic checklist: patient identified, IV checked, site marked, risks and benefits discussed, surgical consent, monitors and equipment checked, pre-op evaluation and timeout performed Left, radial was placed Catheter size: 20 G Hand hygiene performed  and maximum sterile barriers used  Allen's test indicative of satisfactory collateral circulation Attempts: 1 Following insertion, dressing applied and Biopatch. Post procedure assessment: normal  Patient tolerated the procedure well with no immediate complications.

## 2024-08-24 NOTE — Progress Notes (Signed)
 RT assisted Dr. Claudene with bedside bronchoscopy. Specimen obtained and taken to lab.

## 2024-08-24 NOTE — Progress Notes (Signed)
 [facility] Evening Rounds:  Had bronch this afternoon and went into SVT 160's. Will give her a dose of Lopressor  and use prn lopressor  HTN or tachycardia.  Remains on vent after intubation last night.   CXR showed increased bilateral patchy air space and interstitial opacity today.  UO marginal at 223 cc this shift. +1100 cc for this shift.

## 2024-08-24 NOTE — Progress Notes (Addendum)
 PCCM interval progress note:   Pt became more restless with increased tachypnea and work of breathing overnight with sats dropping 80-85% and pt was asking for help, after discussion she agreed with intubation.   Pt was intubated without complication and sats improved, notified pt's husband and [facility] physician on call.   ABG  7.1/74/70/23, FiO2 100%  RR increased from 22 to 32, PEEP from 5 to 8, Driving pressure 15 and Plateau <30  Repeat ABG at 0500  Leita R Kayhan Boardley, PA-C

## 2024-08-24 NOTE — Progress Notes (Signed)
 08/24/2024 Quick repeat bronch for BAL LLL for cell count.  Daughter consented over phone. No complications. No charge.  Rolan Sharps MD PCCM

## 2024-08-24 NOTE — Procedures (Signed)
 Intubation Procedure Note  [name]  [id number]  Dec 23, 1955  Date:08/24/24  Time:12:51 AM   Provider Performing:Trew Sunde R Nahsir Venezia    Procedure: Intubation (31500)  Indication(s) Respiratory Failure  Consent Risks of the procedure as well as the alternatives and risks of each were explained to the patient and/or caregiver.  Consent for the procedure was obtained and is signed in the bedside chart   Anesthesia Etomidate , Versed , Fentanyl , and Rocuronium    Time Out Verified patient identification, verified procedure, site/side was marked, verified correct patient position, special equipment/implants available, medications/allergies/relevant history reviewed, required imaging and test results available.   Sterile Technique Usual hand hygeine, masks, and gloves were used   Procedure Description Patient positioned in bed supine.  Sedation given as noted above.  Patient was intubated with endotracheal tube using Glidescope.  View was Grade 1 full glottis .  Number of attempts was 1.  Colorimetric CO2 detector was consistent with tracheal placement.   Complications/Tolerance None; patient tolerated the procedure well. Chest X-ray is ordered to verify placement.   EBL Minimal   Specimen(s) None   Kelly SAUNDERS Jazarah Capili, PA-C

## 2024-08-24 NOTE — Procedures (Signed)
 Bronchoscopy Procedure Note  [name]  [id number]  11/10/1955  Date:08/24/24  Time:11:18 AM   Provider Performing:Analiz Tvedt P Turner   Procedure(s):  Flexible bronchoscopy with bronchial alveolar lavage [phone])  Indication(s) Acute respiratory failure, multilobar pneumonia  Consent Risks of the procedure as well as the alternatives and risks of each were explained to the patient and/or caregiver.  Consent for the procedure was obtained and is signed in the bedside chart  Anesthesia Per MAR   Time Out Verified patient identification, verified procedure, site/side was marked, verified correct patient position, special equipment/implants available, medications/allergies/relevant history reviewed, required imaging and test results available.   Sterile Technique Usual hand hygiene, masks, gowns, and gloves were used   Procedure Description Bronchoscope advanced through endotracheal tube and into airway.  Airways were examined down to subsegmental level with findings noted below.   Following diagnostic evaluation, BAL(s) performed in LUL with normal saline and return of 40cc fluid  Findings: Left side full airway exam- thick tan secretions in proximal airways, but no purulent secretions from subsegmental bronchi.. Right side was examined in RUL and observed the airway defect along the medial wall of the BI from mainstem bronchus. Due to known airway defect, there was no exam in BI or into RLL.    Complications/Tolerance None; patient tolerated the procedure well. Chest X-ray is not needed post procedure.   EBL 0   Specimen(s) BAL send for cell count with diff, cultures for fungus, AFB, routine culture, aspergillus  Kelly [name], DO 08/24/24 11:21 AM [facility]

## 2024-08-24 NOTE — Progress Notes (Signed)
 "  NAME:  [name], MRN:  [id number], DOB:  04/24/1956, LOS: 29 ADMISSION DATE:  07/26/2024, CONSULTATION DATE:  1/15 REFERRING MD:  Kerrin, CHIEF COMPLAINT:  sepsis   History of Present Illness:  [name] is a 69 y/o woman with a history of tobacco abuse and lung adenocarcinoma status post RML lobectomy on 12/29.  At the time of surgery was a large cyst that was resected. Despite oversewing it time resection she later developed an esophageal leak.  She required an esophageal stent which she has had recurrent problems with related to positioning/migration of the stent (necessitating multiple EGDs for stent revision). From 1/15 through 1/18, the patient was consistently total body balance positive in setting of receiving NS @ 100 mL/hr and then starting TPN as of 1/18. Due to concomitant AKI, developed volume overload and pulmonary edema and hypertensive urgency ultimately requiring a Cardene  drip be started overnight 1/19. Also on 1/19, she underwent revision of the position of Kelly esophageal stent to address a small esophageal leak.  Pertinent  Medical History  Lung adenocarcinoma Latent  TB, treated in 1980 HTN HLD DM OSA Tobacco abuse  Significant Hospital Events: Including procedures, antibiotic start and stop dates in addition to other pertinent events   12/29 RML lobectomy 1/7 EGD with esophageal stent 1/9 EGD  with stent reposition after migration to stomach 1/15 vanc added, zosyn  restarted. CT  1/19: EGD for stent repositioning due to ongoing small esophageal leak / stent migration 1/20: Gastrograffin study -> no esophageal leak. Started on clear liquid diet. 1/21: Full liquid diet. Diuresed. 1/23 rapid rise in oxygen requirements, back to NPO 1/24 CT with multilobar crazy paving, remained on NRB. Added linzolid 1/25 blood cultures drawn, changed zosyn > cefepime  + flagyl  1/26   Interim History / Subjective:  Intubated overnight for worsening respiratory failure.    Objective    Blood pressure (!) 141/51, pulse (!) 106, temperature 99.1 F (37.3 C), resp. rate (!) 34, height 5' 3 (1.6 m), weight 80.6 kg, last menstrual period 10/26/1998, SpO2 100%. CVP:  [1 mmHg-28 mmHg] 10 mmHg  Vent Mode: PRVC FiO2 (%):  [60 %-100 %] 60 % Set Rate:  [18 bmp-32 bmp] 32 bmp Vt Set:  [420 mL] 420 mL PEEP:  [5 cmH20-8 cmH20] 8 cmH20 Plateau Pressure:  [23 cmH20-25 cmH20] 25 cmH20   Intake/Output Summary (Last 24 hours) at 08/24/2024 1212 Last data filed at 08/24/2024 1130 Gross per 24 hour  Intake 4570.69 ml  Output 1260 ml  Net 3310.69 ml   Filed Weights   08/22/24 0500 08/23/24 0426 08/24/24 0500  Weight: 77.5 kg 76.6 kg 80.6 kg    Examination: General: critically ill appearing woman lying in bed in NAD, intubated & sedated HENT: [place]/AT, eyes anicteric, ETT Lungs:  Breathing comfortably on MV, mild dyssynchrony, peak pressures about high 20s.  Minimal rhales, no significant wheezing. No air leak from chest tube. Cardiovascular: S1S2, RRR Abdomen: soft, NT Extremities: RUE PICC, no significant edema, no cyanosis  7.2/51/132/21 Na+ 141 BUN 94 Cr 3.79 WBC 29.5 H/H 8.8/25.3 Platelets 276 PCT 1.07  Blood cultures: NGTD Fungal blood cultures: NGTD Sputum culture: abundant GPC  I/O balance:+3.6L, net +17.4L for admission Chest tube 10 cc   Resolved problem list   Assessment and Plan   Esophageal leak- S/p esophageal stenting c/b migration, now s/p repositioning by GI most recently on 1/19 -con't monitoring chest tube output -no NGT, strict NPO  Acute respiratory insufficiency with hypoxia, ARDS- concern for aspiration, but  now worsening left lung infiltrate is worrisome for dysphagia or stent related reflux & aspiration. Another possibility is inflammation from another source- potentially infecious.  Bronchial defect in BI; confirmed on bronchoscopy -bronch today for fungal, AFB, routine cultures, cell count with diff -LTVV -VAP prevention  protocol -PAD protocol for sedation -daily SAT & SBT once appropriate -would con't to avoid steroids unless there is a compelling indication due to concerns about esophageal healing  -due to concern for pneumonia- con't linezolid , cefepime , flagy, plus adding micafungin   Low grade fevers -con't broad antibiotics and antifungals  AKI c/b ATN and hypervolemia> now appears euvolemic on exam and renal function is worsening with diuresis Hyponatremia Suspect she is euvolemic to hypovolemic on exam; low CVP supports this -keep foley -strict I/O -renally dose meds, avoid nephrotoxic meds  Hypertensive urgency -cleviprex  to avoid high IV volumes  Lung adenocarcinoma, s/p lobectomy -PAD protocol -chest tube per TCTS  H/o tobacco abuse -recommend total cessation  Hyperglycemia, not fully controlled. Received one dose of steroids overnight, likely explains Kelly refractory hyperglycemia today -insulin  in TPN -increase glargine to 26 units daily -give additional 5 units of aspart now -SSI PRN -holding jardiance  Deconditioning -PT, OT on hold while intubated  Severe protein energy malnutrition -TPN -Discussed with daughter the recommendation for PEG-J tube to facilitate enteral feeding at some point. D/w IR-- she would need a surgical placement due to location of Kelly colon.  Daughter updated with Dr. Kerrin at bedside. She will update [name] and discuss. They want to wait on the feeding tube for now.   Labs   CBC: Recent Labs  Lab 08/20/24 0258 08/21/24 0427 08/22/24 0222 08/23/24 0415 08/23/24 1808 08/24/24 0128 08/24/24 9662 08/24/24 0420 08/24/24 0505 08/24/24 0616  WBC 15.4* 17.7* 15.8* 21.7*  --   --   --  29.5*  --   --   HGB 8.1* 7.6* 6.8* 8.5*   < > 8.2* 8.2* 8.8* 8.2* 8.2*  HCT 24.0* 21.7* 20.1* 25.0*   < > 24.0* 24.0* 25.3* 24.0* 24.0*  MCV 90.9 95.6 89.7 89.9  --   --   --  94.8  --   --   PLT 134* 131* 138* 180  --   --   --  276  --   --    < > =  values in this interval not displayed.    Basic Metabolic Panel: Recent Labs  Lab 08/19/24 0654 08/20/24 0258 08/21/24 0549 08/21/24 0700 08/22/24 0222 08/23/24 0415 08/23/24 1808 08/24/24 0128 08/24/24 0337 08/24/24 0419 08/24/24 0505 08/24/24 0616  NA 138 135 132*  --  137 143   < > 142 140 141 140 138  K 3.7 3.8 3.7  --  3.8 3.9   < > 4.0 4.4 5.0 4.6 4.5  CL 101 97* 95*  --  100 106  --   --   --  106  --   --   CO2 25 23 22   --  22 23  --   --   --  21*  --   --   GLUCOSE 158* 152* 163*  --  146* 128*  --   --   --  247*  --   --   BUN 66* 78* 84*  --  91* 90*  --   --   --  94*  --   --   CREATININE 2.66* 2.98* 3.25*  --  3.51* 3.78*  --   --   --  3.79*  --   --   CALCIUM 8.3* 7.9* 8.3*  --  8.2* 8.9  --   --   --  8.9  --   --   MG 1.5* 2.3  --   --   --  2.2  --   --   --  2.4  --   --   PHOS 3.1 3.7  --  4.3  --  3.5  --   --   --  5.2*  --   --    < > = values in this interval not displayed.   GFR: Estimated Creatinine Clearance: 14.7 mL/min (A) (by C-G formula based on SCr of 3.79 mg/dL (H)). Recent Labs  Lab 08/21/24 0427 08/22/24 0222 08/23/24 0415 08/24/24 0419 08/24/24 0420  PROCALCITON  --   --   --  1.07  --   WBC 17.7* 15.8* 21.7*  --  29.5*    This patient is critically ill with multiple organ system failure which requires frequent high complexity decision making, assessment, support, evaluation, and titration of therapies. This was completed through the application of advanced monitoring technologies and extensive interpretation of multiple databases. During this encounter critical care time was devoted to patient care services described in this note for 45 minutes.   Leita SHAUNNA Gaskins, DO 08/24/24 2:45 PM [facility]  For contact information, see Amion. If no response to pager, please call PCCM 2H APP. After hours, 7PM- 7AM, please call on call APP for 2H.  "

## 2024-08-25 ENCOUNTER — Inpatient Hospital Stay (HOSPITAL_COMMUNITY)

## 2024-08-25 ENCOUNTER — Ambulatory Visit: Payer: Self-pay | Admitting: Thoracic Surgery (Cardiothoracic Vascular Surgery)

## 2024-08-25 LAB — CBC
HCT: 19.6 % — ABNORMAL LOW (ref 36.0–46.0)
HCT: 19.1 % — ABNORMAL LOW (ref 36.0–46.0)
HCT: 22 % — ABNORMAL LOW (ref 36.0–46.0)
Hemoglobin: 6.5 g/dL — CL (ref 12.0–15.0)
Hemoglobin: 6.4 g/dL — CL (ref 12.0–15.0)
Hemoglobin: 7.6 g/dL — ABNORMAL LOW (ref 12.0–15.0)
MCH: 30.1 pg (ref 26.0–34.0)
MCH: 30.2 pg (ref 26.0–34.0)
MCH: 30.8 pg (ref 26.0–34.0)
MCHC: 33.2 g/dL (ref 30.0–36.0)
MCHC: 33.5 g/dL (ref 30.0–36.0)
MCHC: 34.5 g/dL (ref 30.0–36.0)
MCV: 90.7 fL (ref 80.0–100.0)
MCV: 90.1 fL (ref 80.0–100.0)
MCV: 89.1 fL (ref 80.0–100.0)
Platelets: 211 10*3/uL (ref 150–400)
Platelets: 207 10*3/uL (ref 150–400)
Platelets: 242 10*3/uL (ref 150–400)
RBC: 2.16 MIL/uL — ABNORMAL LOW (ref 3.87–5.11)
RBC: 2.12 MIL/uL — ABNORMAL LOW (ref 3.87–5.11)
RBC: 2.47 MIL/uL — ABNORMAL LOW (ref 3.87–5.11)
RDW: 16.4 % — ABNORMAL HIGH (ref 11.5–15.5)
RDW: 16.2 % — ABNORMAL HIGH (ref 11.5–15.5)
RDW: 15.9 % — ABNORMAL HIGH (ref 11.5–15.5)
WBC: 24.6 10*3/uL — ABNORMAL HIGH (ref 4.0–10.5)
WBC: 24.4 10*3/uL — ABNORMAL HIGH (ref 4.0–10.5)
WBC: 27 10*3/uL — ABNORMAL HIGH (ref 4.0–10.5)
nRBC: 0 % (ref 0.0–0.2)
nRBC: 0.1 % (ref 0.0–0.2)
nRBC: 0.2 % (ref 0.0–0.2)

## 2024-08-25 LAB — POCT I-STAT 7, (LYTES, BLD GAS, ICA,H+H)
Acid-base deficit: 6 mmol/L — ABNORMAL HIGH (ref 0.0–2.0)
Bicarbonate: 19.3 mmol/L — ABNORMAL LOW (ref 20.0–28.0)
Calcium, Ion: 1.22 mmol/L (ref 1.15–1.40)
HCT: 18 % — ABNORMAL LOW (ref 36.0–46.0)
Hemoglobin: 6.1 g/dL — CL (ref 12.0–15.0)
O2 Saturation: 94 %
Potassium: 4.1 mmol/L (ref 3.5–5.1)
Sodium: 138 mmol/L (ref 135–145)
TCO2: 20 mmol/L — ABNORMAL LOW (ref 22–32)
pCO2 arterial: 35.4 mmHg (ref 32–48)
pH, Arterial: 7.345 — ABNORMAL LOW (ref 7.35–7.45)
pO2, Arterial: 74 mmHg — ABNORMAL LOW (ref 83–108)

## 2024-08-25 LAB — COMPREHENSIVE METABOLIC PANEL WITH GFR
ALT: 21 U/L (ref 0–44)
AST: 24 U/L (ref 15–41)
Albumin: 2.3 g/dL — ABNORMAL LOW (ref 3.5–5.0)
Alkaline Phosphatase: 97 U/L (ref 38–126)
Anion gap: 18 — ABNORMAL HIGH (ref 5–15)
BUN: 110 mg/dL — ABNORMAL HIGH (ref 8–23)
CO2: 19 mmol/L — ABNORMAL LOW (ref 22–32)
Calcium: 8.5 mg/dL — ABNORMAL LOW (ref 8.9–10.3)
Chloride: 100 mmol/L (ref 98–111)
Creatinine, Ser: 4.23 mg/dL — ABNORMAL HIGH (ref 0.44–1.00)
GFR, Estimated: 11 mL/min — ABNORMAL LOW
Glucose, Bld: 278 mg/dL — ABNORMAL HIGH (ref 70–99)
Potassium: 3.9 mmol/L (ref 3.5–5.1)
Sodium: 136 mmol/L (ref 135–145)
Total Bilirubin: 0.2 mg/dL (ref 0.0–1.2)
Total Protein: 5.3 g/dL — ABNORMAL LOW (ref 6.5–8.1)

## 2024-08-25 LAB — GLUCOSE, CAPILLARY
Glucose-Capillary: 201 mg/dL — ABNORMAL HIGH (ref 70–99)
Glucose-Capillary: 169 mg/dL — ABNORMAL HIGH (ref 70–99)
Glucose-Capillary: 181 mg/dL — ABNORMAL HIGH (ref 70–99)
Glucose-Capillary: 176 mg/dL — ABNORMAL HIGH (ref 70–99)
Glucose-Capillary: 129 mg/dL — ABNORMAL HIGH (ref 70–99)

## 2024-08-25 LAB — PHOSPHORUS: Phosphorus: 3.7 mg/dL (ref 2.5–4.6)

## 2024-08-25 LAB — PREPARE RBC (CROSSMATCH)

## 2024-08-25 LAB — MAGNESIUM: Magnesium: 2.1 mg/dL (ref 1.7–2.4)

## 2024-08-25 MED ORDER — SODIUM BICARBONATE 8.4 % IV SOLN
50.0000 meq | Freq: Once | INTRAVENOUS | Status: AC
  Administered 2024-08-25: 50 meq via INTRAVENOUS
  Filled 2024-08-25: qty 50

## 2024-08-25 MED ORDER — HYDRALAZINE HCL 20 MG/ML IJ SOLN
10.0000 mg | Freq: Four times a day (QID) | INTRAMUSCULAR | Status: DC
  Administered 2024-08-25 – 2024-08-28 (×13): 10 mg via INTRAVENOUS
  Filled 2024-08-25 (×11): qty 1

## 2024-08-25 MED ORDER — DIATRIZOATE MEGLUMINE & SODIUM 66-10 % PO SOLN
30.0000 mL | Freq: Once | ORAL | Status: AC
  Administered 2024-08-25: 15 mL

## 2024-08-25 MED ORDER — ORAL CARE MOUTH RINSE
15.0000 mL | OROMUCOSAL | Status: DC
  Administered 2024-08-25 – 2024-08-28 (×39): 15 mL via OROMUCOSAL

## 2024-08-25 MED ORDER — SODIUM CHLORIDE 0.9% IV SOLUTION: Freq: Once | INTRAVENOUS | Status: AC

## 2024-08-25 MED ORDER — INSULIN GLARGINE 100 UNIT/ML ~~LOC~~ SOLN
35.0000 [IU] | Freq: Every day | SUBCUTANEOUS | Status: DC
  Administered 2024-08-25: 35 [IU] via SUBCUTANEOUS
  Filled 2024-08-25 (×2): qty 0.35

## 2024-08-25 MED ORDER — METOCLOPRAMIDE HCL 5 MG/ML IJ SOLN
10.0000 mg | Freq: Four times a day (QID) | INTRAMUSCULAR | Status: AC
  Administered 2024-08-25 (×2): 10 mg via INTRAVENOUS
  Filled 2024-08-25 (×2): qty 2

## 2024-08-25 MED ORDER — TRACE MINERALS CU-MN-SE-ZN 300-55-60-3000 MCG/ML IV SOLN
INTRAVENOUS | Status: AC
  Filled 2024-08-25: qty 666.67

## 2024-08-25 MED ORDER — ACETAMINOPHEN 10 MG/ML IV SOLN
1000.0000 mg | Freq: Four times a day (QID) | INTRAVENOUS | Status: AC
  Administered 2024-08-25 – 2024-08-26 (×4): 1000 mg via INTRAVENOUS
  Filled 2024-08-25 (×4): qty 100

## 2024-08-25 NOTE — Progress Notes (Signed)
 PT Cancellation Note  Patient Details Name: [name] MRN: [id number] DOB: Nov 13, 1955   Cancelled Treatment:    Reason Eval/Treat Not Completed: (P) Patient not medically ready Pt intubated and sedated overnight. PT will follow back for appropriateness tomorrow.   [name] PT, DPT Acute Rehabilitation Services Please use secure chat or  Call Office [phone]  [name] 08/25/2024, 10:39 AM

## 2024-08-25 NOTE — Progress Notes (Signed)
 PHARMACY - TOTAL PARENTERAL NUTRITION CONSULT NOTE  Indication: Esophageal leak  Patient Measurements: Height: 5' 3 (160 cm) Weight: 84.2 kg (185 lb 10 oz) IBW/kg (Calculated) : 52.4 TPN AdjBW (KG): 55.8 Body mass index is 32.88 kg/m.  Assessment:  52 YOF with NSCLC here for RML lobectomy, resection of esophageal diverticulum, and lymph node dissection on 12/29.  Found to have a small contained leak post-op.   Patient reports an intentional weight loss of 40 lbs over a couple of years when she was diagnosed with DM.  She typically drinks one Orgain shake every morning for breakfast and eats one meal a day, which could be pizza, spaghetti, chicken, beef or fish along with vegetables and fruits.  Patient was eating normally until her planned surgery. RD concerned with patient being malnourished. Patient was on TPN 12/31 - 1/12 and was advanced to dysphagia 1 diet. However, she was NPO again 1/14-1/16. On 1/17 IR reported small amount of leak from distal esophageal stent. Patient developed AKI since TPN was held. Pharmacy reconsulted for TPN 1/17.  Patient intubated overnight on 1/27. Currently on propofol  and cleviprex . Trial of nicardipine  on 1/26 without adequate BP control. Swapping propofol  to midazolam  to mediate kcal load from lipid containing IV products. Potential for Jtube / PEG at the end of the week per TCTS.   Glucose / Insulin : hx DM, A1c 5.9% - BG 201-187, 59 units SSI/24h, glargine 26units/24hr per TCTS, insulin  removed from TPN on 1/27 (previously 20u, given TPN was concentrated down to 115g CHO from 237g and lantus  was increased outside of TPN), s/p solu-medrol  125mg  1/27  Electrolytes: Na 136, K 3.9, CL 100, CO2 19 (max acetate 1/27, 150mEq sodium bicarb in past 24h), Phos: 3.7 (removed from TPN 1/27), Mg: 2.1 (removed from TPN 1/27), CoCa: 9.9  Renal: AKI - SCr 4.23 up (BL 0.8), BUN 110 - no acute indications for dialysis today per nephrology  Hepatic: 1/28- LFTs / tbili WNL,  albumin  2.3, TG 172 1/26 (noted clevidipine  added on 1/26)  Intake / Output; MIVF: UOP (0.4 ml/kg/hr), LBM 1/26, chest tube 10 mL - , last lasix  1/24  Net IO Since Admission: 24,739.98 mL [08/25/24 0733]  GI Imaging:   12/30 esophagram: Positive for contrast extravasation and an esophageal leak in the mid esophagus 1/2 esophagram: Persistent contrast extravasation and esophageal leak, similar to prior 1/6 esophagram: Limited contrast study due to patient aspiration and severe coughing spell. Study aborted. 1/12 esophagram: Patent esophageal stent. No evidence for extravasation or leak, Delayed esophageal emptying and gastroesophageal reflux leading to stasis of contrast in the esophagus. 1/15 CT: displaced esophageal stent in stomach, no bowel obstruction  1/17 esophagram: small volume esophageal leak at distal stent 1/20 esophagram: no evidence of any leak  GI Surgeries / Procedures:  12/30 right middle lobectomy, resection esophageal diverticulum, lymph node biopsy  1/7 EGD- stent placement  1/9 EGD stent repositioned d/t migration to stomach  1/16 EGD- stent placed at esophageal ulcer, removed gastric stent, normal duoenum  1/19 EGD - stent replaced / repositioned   Central access: PICC 07/28/24 TPN start date: 07/28/24 > 1/12; 1/17 >>   Nutritional Goals:  Goal unconcentrated TPN rate 75 ml/hr to provide 101g AA and 1721 kCal Goal concentrated TPN rate 60 mL/hr to provide 101 g AA and 1743 kCal  RD Estimated Needs Total Energy Estimated Needs: 1700-1900 Total Protein Estimated Needs: 100-115g Total Fluid Estimated Needs: >1.7L/day  Current Nutrition:  TPN 1/17 >> 1/08 CLD - passed  swallow 1/09 CLD - patient feels can't swallow well > NPO 1/13 advanced to DYS1 1/14 NPO  1/21 FLD, Glucerna x1 on 1/21, milkshake per patient  1/24 NPO    Plan: Continue adjusted TPN to meeting kcal needs while taking into account cleviprex . Cleviprex  infusing at 13-15mg /hr (26-64mL/hr)  for the past several hours. Planning to attempt to wean in the next 24 hours using hydralazine . Total cleviprex  volume in 24 hours = 620-720, = ~ 1250-1450kcal from cleviprex .  TPN at 42mL/hr @ 18:00. Will provide 100g of protein and 500kcal (30g CHO). With cleviprex  will be meeting approximately 100% of nutritional needs (1750-1950kcal). Not max'ing out protein given climbing BUN. Minimal carbohydrate in TPN - just enough to get volume to make bag run at a realistic rate.  Electrolytes in TPN: Na 0 mEq/L, K 0 mEq/L, Ca 0 mEq/L, Mg 0 mEq/L, Phos 0 mmol/L. Cl:Ac max acetate. Removing electrolytes to mediate extra volume plus with worsening AKI, will plan to supplement outside of TPN if needed. Most of electrolytes were removed on 1/27 (mag/phos).  Add standard MVI and trace elements to TPN Continue resistant SSI to q4h and adjust as needed Insulin  glargine increase to 35 units QD per TCTS. Will not add insulin  to TPN given minimal carbohydrate load in TPN, previously on 20 units within TPN however carbohydrate load cut in half on 1/27 with cleviprex  adjustments. Also unsure how much impact the 125mg  of solu-medrol  is causing.  Monitor TPN labs on Mon/Thurs, and daily given AKI.   Thank you for allowing pharmacy to be a part of this patients care.  Powell Blush, PharmD, BCCCP  Clinical Pharmacist

## 2024-08-25 NOTE — Progress Notes (Signed)
 [name] NEPHROLOGY PROGRESS NOTE  Assessment/ Plan: Pt is a 69 y.o. yo female    # Acute kidney Injury: Suspected to have ATN secondary to ischemic and nephrotoxic etiologies.  Serologies negative for GN including vasculitis/anti-GBM given previous concerns of gross hematuria.  Likely ATN with continued worsening of renal function in the setting of need for diuresis for volume unloading, severe anemia with ongoing hemodynamic changes.  - Noted urine output has come down with elevation of BUN and creatinine level.  Getting blood transfusion for anemia and continue supportive care for now.  No acute indication for dialysis at this time however, the daughter understands that she may need dialysis if renal function continue to worsen. - Strict ins and out and daily lab.  # Esophageal leak following resection of esophageal diverticulum: Underwent placement of a new esophageal stent previous one migrated distally into the stomach.  EGD and esophagram without evidence of leak.   # Hypertensive urgency: Required Cleviprex  IV, monitor BP.  # Anemia: Getting blood transfusion.  Monitor hemoglobin.  # Acute hypoxic respiratory failure: Suspected to be multifactorial with pneumonia versus pneumonitis/acute lung injury and possibly superimposed pulmonary edema.  On broad-spectrum antibiotics with cefepime /fluconazole /linezolid  as well as ongoing diuretic.  Intubated on 1/27 and had bronchoscopy.  Subjective: Seen and examined in ICU.  Urine output is documented only 514 cc.  Currently intubated, sedated.  Getting blood transfusion.  Discussed with ICU team.  Objective Vital signs in last 24 hours: Vitals:   08/25/24 0615 08/25/24 0630 08/25/24 0645 08/25/24 0700  BP:    (!) 121/48  Pulse: 97 97 99 97  Resp: (!) 33 (!) 32 (!) 34 (!) 28  Temp: 99.5 F (37.5 C) 99.5 F (37.5 C) 99.5 F (37.5 C) 99.5 F (37.5 C)  TempSrc:      SpO2: 99% 99% 98% 98%  Weight:      Height:        Weight change: 3.6 kg  Intake/Output Summary (Last 24 hours) at 08/25/2024 0842 Last data filed at 08/25/2024 0755 Gross per 24 hour  Intake 3850.18 ml  Output 488 ml  Net 3362.18 ml       Labs: RENAL PANEL Recent Labs  Lab 08/19/24 0654 08/20/24 0258 08/21/24 0549 08/21/24 0700 08/22/24 0222 08/23/24 0415 08/23/24 1808 08/24/24 0419 08/24/24 0505 08/24/24 0616 08/24/24 1654 08/25/24 0409 08/25/24 0735  NA 138 135 132*  --  137 143   < > 141 140 138 138 136 138  K 3.7 3.8 3.7  --  3.8 3.9   < > 5.0 4.6 4.5 3.9 3.9 4.1  CL 101 97* 95*  --  100 106  --  106  --   --   --  100  --   CO2 25 23 22   --  22 23  --  21*  --   --   --  19*  --   GLUCOSE 158* 152* 163*  --  146* 128*  --  247*  --   --   --  278*  --   BUN 66* 78* 84*  --  91* 90*  --  94*  --   --   --  110*  --   CREATININE 2.66* 2.98* 3.25*  --  3.51* 3.78*  --  3.79*  --   --   --  4.23*  --   CALCIUM 8.3* 7.9* 8.3*  --  8.2* 8.9  --  8.9  --   --   --  8.5*  --   MG 1.5* 2.3  --   --   --  2.2  --  2.4  --   --   --  2.1  --   PHOS 3.1 3.7  --  4.3  --  3.5  --  5.2*  --   --   --  3.7  --   ALBUMIN  2.8*  --   --   --   --  2.6*  --   --   --   --   --  2.3*  --    < > = values in this interval not displayed.    Liver Function Tests: Recent Labs  Lab 08/19/24 0654 08/23/24 0415 08/25/24 0409  AST 37 40 24  ALT 24 24 21   ALKPHOS 47 104 97  BILITOT 0.4 0.3 0.2  PROT 5.6* 5.9* 5.3*  ALBUMIN  2.8* 2.6* 2.3*   No results for input(s): LIPASE, AMYLASE in the last 168 hours. No results for input(s): AMMONIA in the last 168 hours. CBC: Recent Labs    08/24/24 1654 08/24/24 1807 08/25/24 0409 08/25/24 0501 08/25/24 0735  HGB 7.1* 7.1* 6.5* 6.4* 6.1*  MCV  --  92.1 90.7 90.1  --     Cardiac Enzymes: No results for input(s): CKTOTAL, CKMB, CKMBINDEX, TROPONINI in the last 168 hours. CBG: Recent Labs  Lab 08/24/24 1609 08/24/24 2008 08/24/24 2334 08/25/24 0323 08/25/24 0758   GLUCAP 287* 208* 218* 201* 169*    Iron Studies: No results for input(s): IRON, TIBC, TRANSFERRIN, FERRITIN in the last 72 hours. Studies/Results: DG CHEST PORT 1 VIEW Result Date: 08/24/2024 EXAM: 1 VIEW(S) XRAY OF THE CHEST 08/24/2024 12:46:25 AM COMPARISON: 08/23/2024 CLINICAL HISTORY: Encounter for intubation. FINDINGS: LINES, TUBES AND DEVICES: Endotracheal tube in place with tip 3 cm above the carina. Right PICC in place with tip overlying the expected region of the distal superior vena cava. Esophageal stent noted. LUNGS AND PLEURA: Interval increase in patchy airspace and interstitial opacities throughout. Small bilateral pleural effusions. No pneumothorax. HEART AND MEDIASTINUM: No acute abnormality of the cardiac and mediastinal silhouettes. BONES AND SOFT TISSUES: No acute osseous abnormality. IMPRESSION: 1. Endotracheal tube tip 3 cm above the carina. 2. Right PICC tip overlies the distal superior vena cava. 3. Interval increase in patchy airspace and interstitial opacities throughout. 4. Small bilateral pleural effusions. Electronically signed by: Oneil Devonshire MD 08/24/2024 12:49 AM EST RP Workstation: [id number]   DG CHEST PORT 1 VIEW Result Date: 08/23/2024 EXAM: 1 VIEW(S) XRAY OF THE CHEST 08/23/2024 06:00:00 PM COMPARISON: 08/23/2024 CLINICAL HISTORY: Respiratory failure. ICD10: [id number] Respiratory failure (HCC). FINDINGS: LINES, TUBES AND DEVICES: Esophageal stent in upper to midthoracic esophagus, unchanged in position. Stable right PICC. LUNGS AND PLEURA: Unchanged bilateral airspace opacities. Possible small pleural effusions. No pneumothorax. HEART AND MEDIASTINUM: No acute abnormality of the cardiac and mediastinal silhouettes. BONES AND SOFT TISSUES: No acute osseous abnormality. IMPRESSION: 1. Unchanged bilateral airspace opacities and possible small pleural effusions. 2. Esophageal stent in the upper to midthoracic esophagus, unchanged in position. 3. Stable right PICC.  Electronically signed by: Oneil Devonshire MD 08/23/2024 06:14 PM EST RP Workstation: [id number]    Medications: Infusions:  acetaminophen  Stopped (08/25/24 0547)   ceFEPime  (MAXIPIME ) IV Stopped (08/24/24 1136)   clevidipine  13 mg/hr (08/25/24 0700)   famotidine  (PEPCID ) IV Stopped (08/24/24 0945)   fentaNYL  infusion INTRAVENOUS 225 mcg/hr (08/25/24 0700)   linezolid  (ZYVOX ) IV Stopped (08/25/24 0009)   metronidazole  500 mg (08/25/24 0755)  micafungin  (MYCAMINE ) 100 mg in sodium chloride  0.9 % 100 mL IVPB Stopped (08/24/24 1350)   midazolam  5 mg/hr (08/25/24 0700)   promethazine  (PHENERGAN ) injection (IM or IVPB) Stopped (08/19/24 1554)   TPN ADULT (ION) 40 mL/hr at 08/25/24 0700    Scheduled Medications:  sodium chloride    Intravenous Once   Chlorhexidine  Gluconate Cloth  6 each Topical Daily   cloNIDine   0.2 mg Transdermal Weekly   fentaNYL   1 patch Transdermal Q72H   heparin  injection (subcutaneous)  5,000 Units Subcutaneous Q8H   insulin  aspart  0-20 Units Subcutaneous Q4H   insulin  glargine  35 Units Subcutaneous Daily   ipratropium-albuterol   3 mL Nebulization TID   lidocaine   2 patch Transdermal Q24H   neomycin -bacitracin -polymyxin   Topical BID   nitroGLYCERIN   0.3 mg Transdermal Daily   mouth rinse  15 mL Mouth Rinse Q2H   sodium chloride  flush  10-40 mL Intracatheter Q12H    have reviewed scheduled and prn medications.  Physical Exam: General: Intubated, sedated. Heart:RRR, s1s2 nl Lungs: Diffuse Rales and crackles. Abdomen:soft, Non-tender, non-distended Extremities:No edema Neurology: Sedated.  [name] 08/25/2024,8:42 AM  LOS: 30 days

## 2024-08-25 NOTE — Progress Notes (Signed)
 PCCM cross cover   Pt with Hgb of 6.4 this morning, verified on repeat.  No signs of active bleeding overnight though BP has been downtrending to 120's systolic and hydralazine  was held overnight.   I spoke with her daughter to get consent.  Hold heparin    [name] Jasn Xia, PA-C

## 2024-08-25 NOTE — Progress Notes (Signed)
 9 Days Post-Op Procedures (LRB): EGD (ESOPHAGOGASTRODUODENOSCOPY) (N/A) CONTROL OF HEMORRHAGE, GI TRACT, ENDOSCOPIC, BY CLIPPING OR OVERSEWING Subjective: Intubated, sedated  Objective: Vital signs in last 24 hours: Temp:  [98.8 F (37.1 C)-100.2 F (37.9 C)] 99.5 F (37.5 C) (01/28 0700) Pulse Rate:  [95-160] 97 (01/28 0700) Cardiac Rhythm: Normal sinus rhythm (01/28 0400) Resp:  [0-38] 28 (01/28 0700) BP: (115-148)/(46-63) 121/48 (01/28 0700) SpO2:  [96 %-100 %] 98 % (01/28 0700) FiO2 (%):  [45 %-70 %] 45 % (01/28 0744) Weight:  [84.2 kg] 84.2 kg (01/28 0500)  Hemodynamic parameters for last 24 hours: CVP:  [4 mmHg-49 mmHg] 5 mmHg  Intake/Output from previous day: 01/27 0701 - 01/28 0700 In: 4074.9 [I.V.:2744.1; IV Piggyback:1330.9] Out: 523 [Urine:513; Chest Tube:10] Intake/Output this shift: No intake/output data recorded.  General appearance: intubated  Lab Results: Recent Labs    08/25/24 0409 08/25/24 0501 08/25/24 0735  WBC 24.6* 24.4*  --   HGB 6.5* 6.4* 6.1*  HCT 19.6* 19.1* 18.0*  PLT 211 207  --    BMET:  Recent Labs    08/24/24 0419 08/24/24 0505 08/25/24 0409 08/25/24 0735  NA 141   < > 136 138  K 5.0   < > 3.9 4.1  CL 106  --  100  --   CO2 21*  --  19*  --   GLUCOSE 247*  --  278*  --   BUN 94*  --  110*  --   CREATININE 3.79*  --  4.23*  --   CALCIUM 8.9  --  8.5*  --    < > = values in this interval not displayed.    PT/INR: No results for input(s): LABPROT, INR in the last 72 hours. ABG    Component Value Date/Time   PHART 7.345 (L) 08/25/2024 0735   HCO3 19.3 (L) 08/25/2024 0735   TCO2 20 (L) 08/25/2024 0735   ACIDBASEDEF 6.0 (H) 08/25/2024 0735   O2SAT 94 08/25/2024 0735   CBG (last 3)  Recent Labs    08/24/24 2008 08/24/24 2334 08/25/24 0323  GLUCAP 208* 218* 201*    Assessment/Plan: S/P Procedures (LRB): EGD (ESOPHAGOGASTRODUODENOSCOPY) (N/A) CONTROL OF HEMORRHAGE, GI TRACT, ENDOSCOPIC, BY CLIPPING OR  OVERSEWING Remains critically ill NEURO- requiring high doses of sedation CV- sinus tachy  Hypertension remains on Cleviprex  drip, clonidine ,hydralazine  held last night  CVP 6 RESP_ VDRF  BAL mostly monocytes/ macrophages  CXR maybe minimally improved   ID- WBC up to 24 K, low grade temp On cefepime , Flagyl , micafungin  RENAL- worsening AKI, creatinine up to 4.2  Lytes and volume status OK for now GI- esophageal stent in place, NPO Anemia- Hgb 6- transfuse SCD + Sq heparin    LOS: 30 days    [name] 08/25/2024

## 2024-08-25 NOTE — Progress Notes (Signed)
 "  NAME:  [name], MRN:  [id number], DOB:  1956/07/27, LOS: 30 ADMISSION DATE:  07/26/2024, CONSULTATION DATE:  1/15 REFERRING MD:  Kerrin, CHIEF COMPLAINT:  sepsis   History of Present Illness:  [name] is a 69 y/o woman with a history of tobacco abuse and lung adenocarcinoma status post RML lobectomy on 12/29.  At the time of surgery was a large cyst that was resected. Despite oversewing it time resection she later developed an esophageal leak.  She required an esophageal stent which she has had recurrent problems with related to positioning/migration of the stent (necessitating multiple EGDs for stent revision). From 1/15 through 1/18, the patient was consistently total body balance positive in setting of receiving NS @ 100 mL/hr and then starting TPN as of 1/18. Due to concomitant AKI, developed volume overload and pulmonary edema and hypertensive urgency ultimately requiring a Cardene  drip be started overnight 1/19. Also on 1/19, she underwent revision of the position of her esophageal stent to address a small esophageal leak.  Pertinent  Medical History  Lung adenocarcinoma Latent  TB, treated in 1980 HTN HLD DM OSA Tobacco abuse  Significant Hospital Events: Including procedures, antibiotic start and stop dates in addition to other pertinent events   12/29 RML lobectomy 1/7 EGD with esophageal stent 1/9 EGD  with stent reposition after migration to stomach 1/15 vanc added, zosyn  restarted. CT  1/19: EGD for stent repositioning due to ongoing small esophageal leak / stent migration 1/20: Gastrograffin study -> no esophageal leak. Started on clear liquid diet. 1/21: Full liquid diet. Diuresed. 1/23 rapid rise in oxygen requirements, back to NPO 1/24 CT with multilobar crazy paving, remained on NRB. Added linzolid 1/25 blood cultures drawn, changed zosyn > cefepime  + flagyl  1/26 intubated overnight 1/27 bronch with BAL, broadened antifungal coverage  Interim History /  Subjective:  1 unit pRBC this morning. Tmax 100.  Objective    Blood pressure (!) 123/52, pulse 97, temperature 99.5 F (37.5 C), resp. rate (!) 29, height 5' 3 (1.6 m), weight 84.2 kg, last menstrual period 10/26/1998, SpO2 98%. CVP:  [4 mmHg-49 mmHg] 6 mmHg  Vent Mode: PRVC FiO2 (%):  [50 %-70 %] 50 % Set Rate:  [32 bmp] 32 bmp Vt Set:  [420 mL] 420 mL PEEP:  [8 cmH20] 8 cmH20 Plateau Pressure:  [25 cmH20-26 cmH20] 26 cmH20   Intake/Output Summary (Last 24 hours) at 08/25/2024 0634 Last data filed at 08/25/2024 0600 Gross per 24 hour  Intake 3981.63 ml  Output 733 ml  Net 3248.63 ml   Filed Weights   08/23/24 0426 08/24/24 0500 08/25/24 0500  Weight: 76.6 kg 80.6 kg 84.2 kg    Examination: General: critically ill appearing woman lying in bed in NAD HENT: [place]/AT, eyes anicteric Lungs:  breathing comfortably on MV- less dyssynchrony after vent adjustments. Peak pressures in high 29s, Pplat 23. Rhales L>R Cardiovascular: S1S2, RRR Abdomen: soft, NT Extremities: starting to get edema  7.35/35/74/19 on PRVC 24/400/8/45%  BUN 110 Cr 4.23 WBC 24.4 H/H 6.4/19.1 Platelets 207  CXR personally reviewed> similar bilateral infiltrates, spared area of RLL, esophageal stent. ETT ~4cm above carina.   RVP negative  BAL cell count with diff, 40% PMN,ms  55% monos, 1 eos  Blood cultures: NGTD Fungal blood cultures: NGTD Sputum culture 1/25: abundant GPC> normal flora  I/O balance:+3.4L Chest tube 10cc  Resolved problem list   Assessment and Plan   Esophageal leak- S/p esophageal stenting c/b migration, now s/p repositioning by GI  most recently on 1/19 -con't monitoring chest tube output -will discuss with GI possible endoscopically guided NGT for enteral feeding options  Acute respiratory insufficiency with hypoxia, ARDS- concern for aspiration, but now worsening left lung infiltrate is worrisome for dysphagia or stent related reflux & aspiration. Another possibility is  inflammation from another source- potentially infecious. BAL is mixed cell type with surprisingly low PMN counts and very low eosinophils. Now considering COP as a post-inflammatory infectious condition. With CVP 6 today, low suspicion this is fully explained by volume. RVP suggests against vital etiology of pneumonia.  Bronchial defect in BI; confirmed on bronchoscopy -follow bronch studies from 1/27 -LTVV -VAP prevention protocol -PAD protocol for sedation -daily SAT & SBT as appropriate -have been trying to avoid steroids; may end up having to use them if antimicrobials are ineffective and cultures remain negative -con't mica & fluconazol, con't cefepime , linezolid , and flagyl   Low grade fevers> resolved -con't antimicrobials  AKI c/b ATN and hypervolemia> now appears euvolemic on exam and renal function is worsening with diuresis Hyponatremia Suspect she is euvolemic to hypovolemic on exam; low CVP supports this -keep foley -strict I/O -d/w nephro, con't supportive care but may end up needing CRRT in the coming days -renally dose meds, avoid nephrotoxic meds  Anemia of critical illness -transfused 1 unit pRBC overnight -recheck this afternoon, may need another unit  Hypertensive urgency -restart hydralzine -clevidipine  -clonidine  patch restarted yesterday  Lung adenocarcinoma, s/p lobectomy -chest tube per TCTS  H/o tobacco abuse -recommend cessation  Hyperglycemia, not fully controlled. Received one dose of steroids overnight, likely explains her refractory hyperglycemia today -glargine 26 units daily; may be able to come down if she remains off steroids -SSI PRN -goal BG 140-180 -holding jardiance  Deconditioning -eventually will need CIR  Severe protein energy malnutrition -con't TPN -Going to discuss with GI possible small bore NGT-- with stent in place will likely need EGD guided placement. Will discuss with GI.  -not an IR candidate for placement due to  overlying colon  Discussed during multidisciplinary rounds.  Labs   CBC: Recent Labs  Lab 08/23/24 0415 08/23/24 1808 08/24/24 0420 08/24/24 0505 08/24/24 0616 08/24/24 1654 08/24/24 1807 08/25/24 0409 08/25/24 0501  WBC 21.7*  --  29.5*  --   --   --  21.5* 24.6* 24.4*  HGB 8.5*   < > 8.8*   < > 8.2* 7.1* 7.1* 6.5* 6.4*  HCT 25.0*   < > 25.3*   < > 24.0* 21.0* 20.9* 19.6* 19.1*  MCV 89.9  --  94.8  --   --   --  92.1 90.7 90.1  PLT 180  --  276  --   --   --  186 211 207   < > = values in this interval not displayed.    Basic Metabolic Panel: Recent Labs  Lab 08/19/24 0654 08/20/24 0258 08/21/24 0549 08/21/24 0700 08/22/24 0222 08/23/24 0415 08/23/24 1808 08/24/24 0419 08/24/24 0505 08/24/24 0616 08/24/24 1654 08/25/24 0409  NA 138 135 132*  --  137 143   < > 141 140 138 138 136  K 3.7 3.8 3.7  --  3.8 3.9   < > 5.0 4.6 4.5 3.9 3.9  CL 101 97* 95*  --  100 106  --  106  --   --   --  100  CO2 25 23 22   --  22 23  --  21*  --   --   --  19*  GLUCOSE 158* 152* 163*  --  146* 128*  --  247*  --   --   --  278*  BUN 66* 78* 84*  --  91* 90*  --  94*  --   --   --  110*  CREATININE 2.66* 2.98* 3.25*  --  3.51* 3.78*  --  3.79*  --   --   --  4.23*  CALCIUM 8.3* 7.9* 8.3*  --  8.2* 8.9  --  8.9  --   --   --  8.5*  MG 1.5* 2.3  --   --   --  2.2  --  2.4  --   --   --  2.1  PHOS 3.1 3.7  --  4.3  --  3.5  --  5.2*  --   --   --  3.7   < > = values in this interval not displayed.   GFR: Estimated Creatinine Clearance: 13.4 mL/min (A) (by C-G formula based on SCr of 4.23 mg/dL (H)). Recent Labs  Lab 08/24/24 0419 08/24/24 0420 08/24/24 1807 08/25/24 0409 08/25/24 0501  PROCALCITON 1.07  --   --   --   --   WBC  --  29.5* 21.5* 24.6* 24.4*    This patient is critically ill with multiple organ system failure which requires frequent high complexity decision making, assessment, support, evaluation, and titration of therapies. This was completed through the  application of advanced monitoring technologies and extensive interpretation of multiple databases. During this encounter critical care time was devoted to patient care services described in this note for 50 minutes.   Leita SHAUNNA Gaskins, DO 08/25/24 9:53 AM [facility]  For contact information, see Amion. If no response to pager, please call PCCM 2H APP. After hours, 7PM- 7AM, please call on call APP for 2H.  "

## 2024-08-25 NOTE — Progress Notes (Signed)
 EVENING ROUNDS NOTE :     [address] [employer] [address]             [phone]               9 Days Post-Op Procedures (LRB): EGD (ESOPHAGOGASTRODUODENOSCOPY) (N/A) CONTROL OF HEMORRHAGE, GI TRACT, ENDOSCOPIC, BY CLIPPING OR OVERSEWING   Total Length of Stay:  LOS: 30 days  Events:   No events     BP (!) 132/49   Pulse (!) 122   Temp (!) 100.4 F (38 C)   Resp (!) 24   Ht 5' 3 (1.6 m)   Wt 84.2 kg   LMP 10/26/1998 Comment: ~IN 40s  SpO2 91%   BMI 32.88 kg/m   CVP:  [4 mmHg-49 mmHg] 12 mmHg  Vent Mode: PRVC FiO2 (%):  [45 %-70 %] 50 % Set Rate:  [24 bmp-32 bmp] 24 bmp Vt Set:  [420 mL] 420 mL PEEP:  [8 cmH20] 8 cmH20 Plateau Pressure:  [24 cmH20-26 cmH20] 24 cmH20   acetaminophen  Stopped (08/25/24 1213)   ceFEPime  (MAXIPIME ) IV Stopped (08/25/24 1429)   clevidipine  13 mg/hr (08/25/24 1500)   famotidine  (PEPCID ) IV Stopped (08/25/24 1148)   fentaNYL  infusion INTRAVENOUS 225 mcg/hr (08/25/24 1548)   linezolid  (ZYVOX ) IV Stopped (08/25/24 1458)   metronidazole  Stopped (08/25/24 0855)   micafungin  (MYCAMINE ) 100 mg in sodium chloride  0.9 % 100 mL IVPB Stopped (08/25/24 1329)   midazolam  5 mg/hr (08/25/24 1500)   promethazine  (PHENERGAN ) injection (IM or IVPB) Stopped (08/19/24 1554)   TPN ADULT (ION) 40 mL/hr at 08/25/24 1500   TPN ADULT (ION)      I/O last 3 completed shifts: In: 5815.9 [I.V.:4090.3; IV Piggyback:1725.5] Out: 1118 [Urine:1108; Chest Tube:10]      Latest Ref Rng & Units 08/25/2024   12:05 PM 08/25/2024    7:35 AM 08/25/2024    5:01 AM  CBC  WBC 4.0 - 10.5 K/uL 27.0   24.4   Hemoglobin 12.0 - 15.0 g/dL 7.6  6.1  6.4   Hematocrit 36.0 - 46.0 % 22.0  18.0  19.1   Platelets 150 - 400 K/uL 242   207        Latest Ref Rng & Units 08/25/2024    7:35 AM 08/25/2024    4:09 AM 08/24/2024    4:54 PM  BMP  Glucose 70 - 99 mg/dL  721    BUN 8 - 23 mg/dL  889    Creatinine 9.55 - 1.00 mg/dL  5.76    Sodium 864 - 854  mmol/L 138  136  138   Potassium 3.5 - 5.1 mmol/L 4.1  3.9  3.9   Chloride 98 - 111 mmol/L  100    CO2 22 - 32 mmol/L  19    Calcium 8.9 - 10.3 mg/dL  8.5      ABG    Component Value Date/Time   PHART 7.345 (L) 08/25/2024 0735   PCO2ART 35.4 08/25/2024 0735   PO2ART 74 (L) 08/25/2024 0735   HCO3 19.3 (L) 08/25/2024 0735   TCO2 20 (L) 08/25/2024 0735   ACIDBASEDEF 6.0 (H) 08/25/2024 0735   O2SAT 94 08/25/2024 0735       Linnie Rayas, MD 08/25/2024 4:51 PM

## 2024-08-26 ENCOUNTER — Inpatient Hospital Stay (HOSPITAL_COMMUNITY)

## 2024-08-26 LAB — CULTURE, BAL-QUANTITATIVE W GRAM STAIN: Culture: NO GROWTH

## 2024-08-26 LAB — GLUCOSE, CAPILLARY
Glucose-Capillary: 100 mg/dL — ABNORMAL HIGH (ref 70–99)
Glucose-Capillary: 103 mg/dL — ABNORMAL HIGH (ref 70–99)
Glucose-Capillary: 109 mg/dL — ABNORMAL HIGH (ref 70–99)
Glucose-Capillary: 110 mg/dL — ABNORMAL HIGH (ref 70–99)
Glucose-Capillary: 124 mg/dL — ABNORMAL HIGH (ref 70–99)
Glucose-Capillary: 141 mg/dL — ABNORMAL HIGH (ref 70–99)

## 2024-08-26 LAB — RENAL FUNCTION PANEL
Albumin: 2.5 g/dL — ABNORMAL LOW (ref 3.5–5.0)
Anion gap: 16 — ABNORMAL HIGH (ref 5–15)
BUN: 117 mg/dL — ABNORMAL HIGH (ref 8–23)
CO2: 19 mmol/L — ABNORMAL LOW (ref 22–32)
Calcium: 8 mg/dL — ABNORMAL LOW (ref 8.9–10.3)
Chloride: 100 mmol/L (ref 98–111)
Creatinine, Ser: 3.91 mg/dL — ABNORMAL HIGH (ref 0.44–1.00)
GFR, Estimated: 12 mL/min — ABNORMAL LOW
Glucose, Bld: 119 mg/dL — ABNORMAL HIGH (ref 70–99)
Phosphorus: 4.3 mg/dL (ref 2.5–4.6)
Potassium: 4 mmol/L (ref 3.5–5.1)
Sodium: 136 mmol/L (ref 135–145)

## 2024-08-26 LAB — CBC
HCT: 21 % — ABNORMAL LOW (ref 36.0–46.0)
HCT: 24.9 % — ABNORMAL LOW (ref 36.0–46.0)
Hemoglobin: 7 g/dL — ABNORMAL LOW (ref 12.0–15.0)
Hemoglobin: 8.4 g/dL — ABNORMAL LOW (ref 12.0–15.0)
MCH: 30.2 pg (ref 26.0–34.0)
MCH: 30.9 pg (ref 26.0–34.0)
MCHC: 33.3 g/dL (ref 30.0–36.0)
MCHC: 33.7 g/dL (ref 30.0–36.0)
MCV: 90.5 fL (ref 80.0–100.0)
MCV: 91.5 fL (ref 80.0–100.0)
Platelets: 227 10*3/uL (ref 150–400)
Platelets: 226 10*3/uL (ref 150–400)
RBC: 2.32 MIL/uL — ABNORMAL LOW (ref 3.87–5.11)
RBC: 2.72 MIL/uL — ABNORMAL LOW (ref 3.87–5.11)
RDW: 16.4 % — ABNORMAL HIGH (ref 11.5–15.5)
RDW: 15.9 % — ABNORMAL HIGH (ref 11.5–15.5)
WBC: 21.4 10*3/uL — ABNORMAL HIGH (ref 4.0–10.5)
WBC: 21.5 10*3/uL — ABNORMAL HIGH (ref 4.0–10.5)
nRBC: 0.2 % (ref 0.0–0.2)
nRBC: 0.1 % (ref 0.0–0.2)

## 2024-08-26 LAB — POCT I-STAT 7, (LYTES, BLD GAS, ICA,H+H)
Acid-base deficit: 7 mmol/L — ABNORMAL HIGH (ref 0.0–2.0)
Bicarbonate: 19.2 mmol/L — ABNORMAL LOW (ref 20.0–28.0)
Calcium, Ion: 1.2 mmol/L (ref 1.15–1.40)
HCT: 20 % — ABNORMAL LOW (ref 36.0–46.0)
Hemoglobin: 6.8 g/dL — CL (ref 12.0–15.0)
O2 Saturation: 93 %
Patient temperature: 37
Potassium: 4 mmol/L (ref 3.5–5.1)
Sodium: 136 mmol/L (ref 135–145)
TCO2: 20 mmol/L — ABNORMAL LOW (ref 22–32)
pCO2 arterial: 39.3 mmHg (ref 32–48)
pH, Arterial: 7.297 — ABNORMAL LOW (ref 7.35–7.45)
pO2, Arterial: 76 mmHg — ABNORMAL LOW (ref 83–108)

## 2024-08-26 LAB — ACID FAST SMEAR (AFB, MYCOBACTERIA): Acid Fast Smear: NEGATIVE

## 2024-08-26 LAB — COMPREHENSIVE METABOLIC PANEL WITH GFR
ALT: 18 U/L (ref 0–44)
AST: 22 U/L (ref 15–41)
Albumin: 2.4 g/dL — ABNORMAL LOW (ref 3.5–5.0)
Alkaline Phosphatase: 98 U/L (ref 38–126)
Anion gap: 17 — ABNORMAL HIGH (ref 5–15)
BUN: 144 mg/dL — ABNORMAL HIGH (ref 8–23)
CO2: 18 mmol/L — ABNORMAL LOW (ref 22–32)
Calcium: 8.6 mg/dL — ABNORMAL LOW (ref 8.9–10.3)
Chloride: 100 mmol/L (ref 98–111)
Creatinine, Ser: 5.06 mg/dL — ABNORMAL HIGH (ref 0.44–1.00)
GFR, Estimated: 9 mL/min — ABNORMAL LOW
Glucose, Bld: 165 mg/dL — ABNORMAL HIGH (ref 70–99)
Potassium: 4.1 mmol/L (ref 3.5–5.1)
Sodium: 136 mmol/L (ref 135–145)
Total Bilirubin: 0.2 mg/dL (ref 0.0–1.2)
Total Protein: 5.1 g/dL — ABNORMAL LOW (ref 6.5–8.1)

## 2024-08-26 LAB — PHOSPHORUS: Phosphorus: 5 mg/dL — ABNORMAL HIGH (ref 2.5–4.6)

## 2024-08-26 LAB — PREPARE RBC (CROSSMATCH)

## 2024-08-26 LAB — MAGNESIUM: Magnesium: 2.2 mg/dL (ref 1.7–2.4)

## 2024-08-26 MED ORDER — PROPOFOL 1000 MG/100ML IV EMUL
0.0000 ug/kg/min | INTRAVENOUS | Status: DC
  Administered 2024-08-26 (×2): 35 ug/kg/min via INTRAVENOUS
  Administered 2024-08-26: 20 ug/kg/min via INTRAVENOUS
  Administered 2024-08-27 (×3): 40 ug/kg/min via INTRAVENOUS
  Filled 2024-08-26 (×2): qty 100
  Filled 2024-08-26: qty 200
  Filled 2024-08-26 (×3): qty 100

## 2024-08-26 MED ORDER — METOCLOPRAMIDE HCL 5 MG/ML IJ SOLN
10.0000 mg | Freq: Four times a day (QID) | INTRAMUSCULAR | Status: AC
  Administered 2024-08-26 – 2024-08-27 (×4): 10 mg via INTRAVENOUS
  Filled 2024-08-26 (×4): qty 2

## 2024-08-26 MED ORDER — ACETAMINOPHEN 10 MG/ML IV SOLN
1000.0000 mg | Freq: Four times a day (QID) | INTRAVENOUS | Status: AC
  Administered 2024-08-26 – 2024-08-27 (×4): 1000 mg via INTRAVENOUS
  Filled 2024-08-26 (×4): qty 100

## 2024-08-26 MED ORDER — SODIUM CHLORIDE 0.9 % IV SOLN
2.0000 g | Freq: Two times a day (BID) | INTRAVENOUS | Status: DC
  Administered 2024-08-26 – 2024-08-30 (×8): 2 g via INTRAVENOUS
  Filled 2024-08-26 (×8): qty 12.5

## 2024-08-26 MED ORDER — PRISMASOL BGK 4/2.5 32-4-2.5 MEQ/L EC SOLN: Status: DC

## 2024-08-26 MED ORDER — SODIUM CHLORIDE 0.9% IV SOLUTION: Freq: Once | INTRAVENOUS | Status: AC

## 2024-08-26 MED ORDER — TRACE MINERALS CU-MN-SE-ZN 300-55-60-3000 MCG/ML IV SOLN
INTRAVENOUS | Status: AC
  Filled 2024-08-26: qty 733.47

## 2024-08-26 MED ORDER — INSULIN GLARGINE 100 UNIT/ML ~~LOC~~ SOLN
26.0000 [IU] | Freq: Every day | SUBCUTANEOUS | Status: DC
  Administered 2024-08-26 – 2024-08-28 (×3): 26 [IU] via SUBCUTANEOUS
  Filled 2024-08-26 (×4): qty 0.26

## 2024-08-26 MED ORDER — HEPARIN SODIUM (PORCINE) 1000 UNIT/ML DIALYSIS
1000.0000 [IU] | INTRAMUSCULAR | Status: DC | PRN
  Administered 2024-08-27: 2400 [IU] via INTRAVENOUS_CENTRAL
  Administered 2024-08-29 – 2024-08-30 (×2): 3000 [IU] via INTRAVENOUS_CENTRAL
  Filled 2024-08-26 (×3): qty 3

## 2024-08-26 MED ORDER — ACETAMINOPHEN 10 MG/ML IV SOLN
1000.0000 mg | Freq: Four times a day (QID) | INTRAVENOUS | Status: DC
  Administered 2024-08-27: 1000 mg via INTRAVENOUS
  Filled 2024-08-26: qty 100

## 2024-08-26 NOTE — Progress Notes (Signed)
 Patient ID: [name], female   DOB: 01/10/1956, 69 y.o.   MRN: [id number]  TCTS Evening Rounds:  Hemodynamically stable. Afebrile.   Started on CRRT today and tolerating well. Goal is -50-100/hr. Still making some urine.  BMET    Component Value Date/Time   NA 136 08/26/2024 1600   NA 142 04/02/2022 1610   K 4.0 08/26/2024 1600   CL 100 08/26/2024 1600   CO2 19 (L) 08/26/2024 1600   GLUCOSE 119 (H) 08/26/2024 1600   BUN 117 (H) 08/26/2024 1600   BUN 27 04/02/2022 1610   CREATININE 3.91 (H) 08/26/2024 1600   CALCIUM 8.0 (L) 08/26/2024 1600   EGFR 62 04/02/2022 1610   GFRNONAA 12 (L) 08/26/2024 1600   Remains sedated on vent.

## 2024-08-26 NOTE — Progress Notes (Signed)
 "  NAME:  [name], MRN:  [id number], DOB:  Aug 06, 1955, LOS: 31 ADMISSION DATE:  07/26/2024, CONSULTATION DATE:  1/15 REFERRING MD:  Kerrin, CHIEF COMPLAINT:  sepsis   History of Present Illness:  [name] is a 70 y/o woman with a history of tobacco abuse and lung adenocarcinoma status post RML lobectomy on 12/29.  At the time of surgery was a large cyst that was resected. Despite oversewing it time resection she later developed an esophageal leak.  She required an esophageal stent which she has had recurrent problems with related to positioning/migration of the stent (necessitating multiple EGDs for stent revision). From 1/15 through 1/18, the patient was consistently total body balance positive in setting of receiving NS @ 100 mL/hr and then starting TPN as of 1/18. Due to concomitant AKI, developed volume overload and pulmonary edema and hypertensive urgency ultimately requiring a Cardene  drip be started overnight 1/19. Also on 1/19, she underwent revision of the position of her esophageal stent to address a small esophageal leak.  Pertinent  Medical History  Lung adenocarcinoma Latent  TB, treated in 1980 HTN HLD DM OSA Tobacco abuse  Significant Hospital Events: Including procedures, antibiotic start and stop dates in addition to other pertinent events   12/29 RML lobectomy 1/7 EGD with esophageal stent 1/9 EGD  with stent reposition after migration to stomach 1/15 vanc added, zosyn  restarted. CT  1/19: EGD for stent repositioning due to ongoing small esophageal leak / stent migration 1/20: Gastrograffin study -> no esophageal leak. Started on clear liquid diet. 1/21: Full liquid diet. Diuresed. 1/23 rapid rise in oxygen requirements, back to NPO 1/24 CT with multilobar crazy paving, remained on NRB. Added linzolid 1/25 blood cultures drawn, changed zosyn > cefepime  + flagyl  1/26 intubated overnight 1/27 bronch with BAL, broadened antifungal coverage 1/28 1 unit pRBC, weaned  FiO2, cortrak placed by Dr Daniel  Interim History / Subjective:  Overnight no acute events.  Tmax 100.4   Objective    Blood pressure (!) 120/54, pulse 97, temperature 98.6 F (37 C), resp. rate (!) 25, height 5' 3 (1.6 m), weight 85.4 kg, last menstrual period 10/26/1998, SpO2 98%. CVP:  [5 mmHg-39 mmHg] 10 mmHg  Vent Mode: PRVC FiO2 (%):  [45 %-70 %] 50 % Set Rate:  [24 bmp-32 bmp] 24 bmp Vt Set:  [420 mL] 420 mL PEEP:  [8 cmH20] 8 cmH20 Plateau Pressure:  [20 cmH20-27 cmH20] 20 cmH20   Intake/Output Summary (Last 24 hours) at 08/26/2024 0653 Last data filed at 08/26/2024 0600 Gross per 24 hour  Intake 3922.04 ml  Output 950 ml  Net 2972.04 ml   Filed Weights   08/24/24 0500 08/25/24 0500 08/26/24 0500  Weight: 80.6 kg 84.2 kg 85.4 kg    Examination: General: critically ill appearing woman lying in bed intubated, sedated HENT: [place]/AT, eyes anicteric, ETT Lungs:  breathing comfortably on MV, minimal dyssynchrony. Pplat 22, DP 14 Cardiovascular: S1S2, RRR Abdomen: soft, NT, ND Extremities: +edema  UOP 940cc Chest tube 10cc I/O +3L  7.4/40/76/19 BUN 144 Cr 5.06 WBC 21.4 H/H 7/21 Platelets 227  CXR personally reviewed> similar infiltrates, area of clearing in RLL KUB: NGT at pylorus  BAL cell count with diff, 40% PMN,ms  55% monos, 1 eos  BAL cultures: Regular: abundant PMN>  AFB: pending Fungal: pending Aspergillus antigen  Blood cultures: NGTD Fungal blood cultures: NGTD Sputum culture 1/25: abundant GPC> normal flora   Resolved problem list   Assessment and Plan   Esophageal leak-  S/p esophageal stenting c/b migration, now s/p repositioning by GI most recently on 1/19 -monitoring chest tube output  -has small bore NGT in place; need to discuss when we can start slow TF -need to continue to discuss GJ tube with family  Acute respiratory insufficiency with hypoxia, ARDS- concern for aspiration, but now worsening left lung infiltrate is worrisome for  dysphagia or stent related reflux & aspiration. Another possibility is inflammation from another source- potentially infecious. BAL is mixed cell type with surprisingly low PMN counts and very low eosinophils. Now considering COP as a post-inflammatory infectious condition. With CVP 6 today, low suspicion this is fully explained by volume. RVP suggests against vital etiology of pneumonia.  Bronchial defect in BI; confirmed on bronchoscopy -follow bronch studies from 1/27-- fungal & AFB cultures -send fungitell -LTVV -4-8cc/kg IBW with goal Pplat <30 and DP <15-- meeting goals  -VAP prevention protocol -PAD protocol- fent & versed . Change back to propofol  due to hopeful extubation in next 1-2 days.  -Daily SAT & SBT- when appropriate. At this point needs CRRT for metabolic clearance.  -have been trying to avoid steroids due to concern of esophagus not healing; if cultures are negative and leading theory is COP, then will need streoids and unfortuantely a prolonged course -con't broad antimicrobials- flagyl , cefepime , linezolid , mica, fluconazole  -con't mica & fluconazol, con't cefepime , linezolid , and flagyl   Low grade fevers> resolved -con't antimicrobials  AKI c/b ATN and hypervolemia> now appears euvolemic on exam and renal function is worsening with diuresis Hyponatremia Suspect she is euvolemic to hypovolemic on exam; low CVP supports this -con't foley catheter -strict I/O -d/w Nephro & TCTS-- needs CRRT for metabolic clearance - I called daughter Jane to discuss need for CRRT & HD accesss -renally dose meds, avoid nephrotoxic meds  Anemia of critical illness -additional 1 unit pRBC today  Hypertensive urgency -con't hydralzine -clevidipine   -clonidine  patch restarted yesterday; keep hydralazine  the same since this will likely kick in more today  Lung adenocarcinoma, s/p lobectomy -chest tube per TCTS  H/o tobacco abuse -recommend cessation, previously counseled    Hyperglycemia, not fully controlled. Received one dose of steroids overnight, likely explains her refractory hyperglycemia today -glargine 26 units daily  -SSI PRN -goal BG 140-180 -hold PTA jardiance  Deconditioning -eventually will need CIR  Severe protein energy malnutrition -con't PTN -can consider starting TF; need to get HD access in and make decision on TF later --not an IR candidate for GJ placement due to overlying colon  Discussed during multidisciplinary rounds with TCTS & Nephro. I called daughter Jane to discuss her ongoing care and need for CRRT. She is going to speak to her father and discuss.  Labs   CBC: Recent Labs  Lab 08/24/24 1807 08/25/24 0409 08/25/24 0501 08/25/24 0735 08/25/24 1205 08/26/24 0334  WBC 21.5* 24.6* 24.4*  --  27.0* 21.4*  HGB 7.1* 6.5* 6.4* 6.1* 7.6* 7.0*  HCT 20.9* 19.6* 19.1* 18.0* 22.0* 21.0*  MCV 92.1 90.7 90.1  --  89.1 90.5  PLT 186 211 207  --  242 227    Basic Metabolic Panel: Recent Labs  Lab 08/20/24 0258 08/21/24 0549 08/21/24 0700 08/22/24 0222 08/23/24 0415 08/23/24 1808 08/24/24 0419 08/24/24 0505 08/24/24 0616 08/24/24 1654 08/25/24 0409 08/25/24 0735 08/26/24 0334  NA 135   < >  --  137 143   < > 141   < > 138 138 136 138 136  K 3.8   < >  --  3.8 3.9   < > 5.0   < > 4.5 3.9 3.9 4.1 4.1  CL 97*   < >  --  100 106  --  106  --   --   --  100  --  100  CO2 23   < >  --  22 23  --  21*  --   --   --  19*  --  18*  GLUCOSE 152*   < >  --  146* 128*  --  247*  --   --   --  278*  --  165*  BUN 78*   < >  --  91* 90*  --  94*  --   --   --  110*  --  144*  CREATININE 2.98*   < >  --  3.51* 3.78*  --  3.79*  --   --   --  4.23*  --  5.06*  CALCIUM 7.9*   < >  --  8.2* 8.9  --  8.9  --   --   --  8.5*  --  8.6*  MG 2.3  --   --   --  2.2  --  2.4  --   --   --  2.1  --  2.2  PHOS 3.7  --  4.3  --  3.5  --  5.2*  --   --   --  3.7  --  5.0*   < > = values in this interval not displayed.   GFR: Estimated  Creatinine Clearance: 11.3 mL/min (A) (by C-G formula based on SCr of 5.06 mg/dL (H)). Recent Labs  Lab 08/24/24 0419 08/24/24 0420 08/25/24 0409 08/25/24 0501 08/25/24 1205 08/26/24 0334  PROCALCITON 1.07  --   --   --   --   --   WBC  --    < > 24.6* 24.4* 27.0* 21.4*   < > = values in this interval not displayed.    This patient is critically ill with multiple organ system failure which requires frequent high complexity decision making, assessment, support, evaluation, and titration of therapies. This was completed through the application of advanced monitoring technologies and extensive interpretation of multiple databases. During this encounter critical care time was devoted to patient care services described in this note for 52 minutes.   Leita SHAUNNA Gaskins, DO 08/26/24 8:59 AM [facility]  For contact information, see Amion. If no response to pager, please call PCCM 2H APP. After hours, 7PM- 7AM, please call on call APP for 2H.  "

## 2024-08-26 NOTE — Procedures (Signed)
 Central Venous Catheter Insertion Procedure Note  [name]  [id number]  09-06-1955  Date:08/26/24  Time:10:40 AM   Provider Performing:Malissa Slay [name]   Procedure: Insertion of Non-tunneled Central Venous Catheter(36556)with US  guidance (23062)    Indication(s) Hemodialysis  Consent Risks of the procedure as well as the alternatives and risks of each were explained to the patient and/or caregiver.  Consent for the procedure was obtained and is signed in the bedside chart  Anesthesia Topical only with 1% lidocaine    Timeout Verified patient identification, verified procedure, site/side was marked, verified correct patient position, special equipment/implants available, medications/allergies/relevant history reviewed, required imaging and test results available.  Sterile Technique Maximal sterile technique including full sterile barrier drape, hand hygiene, sterile gown, sterile gloves, mask, hair covering, sterile ultrasound probe cover (if used).  Procedure Description Area of catheter insertion was cleaned with chlorhexidine  and draped in sterile fashion.   With real-time ultrasound guidance a HD catheter was placed into the right internal jugular vein.  Guidewire confirmed in compressible vein before dilation. Nonpulsatile blood flow and easy flushing noted in all ports.  The catheter was sutured in place and sterile dressing applied.  Complications/Tolerance None; patient tolerated the procedure well. Chest X-ray is ordered to verify placement for internal jugular or subclavian cannulation.  Chest x-ray is not ordered for femoral cannulation.  EBL Minimal  Specimen(s) None  [name] Kelly Gretta, DO 08/26/24 10:40 AM [facility]

## 2024-08-26 NOTE — Progress Notes (Addendum)
 [facility] DAILY ICU PROGRESS NOTE                   [address]            [place] [address]          [phone]   10 Days Post-Op Procedures (LRB): EGD (ESOPHAGOGASTRODUODENOSCOPY) (N/A) CONTROL OF HEMORRHAGE, GI TRACT, ENDOSCOPIC, BY CLIPPING OR OVERSEWING 13 days s/p bronchoscopy         20 days s/p EGD, reposition esophageal stent 22 days s/p EGD, placement of esophageal stent 31 days s/p Xi RML, LN dissection, intercostal nerve block, resection of esophageal diverticulum  Total Length of Stay:  LOS: 31 days   Subjective: Patient intubated. She opened eyes. She is bringing her hands up.  Objective: Vital signs in last 24 hours: Temp:  [98.4 F (36.9 C)-100.4 F (38 C)] 98.6 F (37 C) (01/29 0725) Pulse Rate:  [93-124] 99 (01/29 0725) Cardiac Rhythm: Normal sinus rhythm (01/29 0400) Resp:  [12-34] 24 (01/29 0725) BP: (120-153)/(49-62) 122/54 (01/29 0725) SpO2:  [87 %-100 %] 96 % (01/29 0725) FiO2 (%):  [45 %-70 %] 45 % (01/29 0725) Weight:  [85.4 kg] 85.4 kg (01/29 0500)  Filed Weights   08/24/24 0500 08/25/24 0500 08/26/24 0500  Weight: 80.6 kg 84.2 kg 85.4 kg    Weight change: 1.2 kg   Hemodynamic parameters for last 24 hours: CVP:  [6 mmHg-39 mmHg] 10 mmHg  Intake/Output from previous day: 01/28 0701 - 01/29 0700 In: 3902.7 [I.V.:2095.4; Blood:376.7; IV Piggyback:1430.6] Out: 950 [Urine:940; Chest Tube:10]  Intake/Output this shift: No intake/output data recorded.  Current Meds: Scheduled Meds:  Chlorhexidine  Gluconate Cloth  6 each Topical Daily   cloNIDine   0.2 mg Transdermal Weekly   fentaNYL   1 patch Transdermal Q72H   hydrALAZINE   10 mg Intravenous Q6H   insulin  aspart  0-20 Units Subcutaneous Q4H   insulin  glargine  35 Units Subcutaneous Daily   ipratropium-albuterol   3 mL Nebulization TID   lidocaine   2 patch Transdermal Q24H   neomycin -bacitracin -polymyxin   Topical BID   nitroGLYCERIN   0.3 mg Transdermal Daily   mouth  rinse  15 mL Mouth Rinse Q2H   sodium chloride  flush  10-40 mL Intracatheter Q12H   Continuous Infusions:  ceFEPime  (MAXIPIME ) IV Stopped (08/25/24 1429)   clevidipine  7 mg/hr (08/26/24 0700)   famotidine  (PEPCID ) IV Stopped (08/25/24 1148)   fentaNYL  infusion INTRAVENOUS 250 mcg/hr (08/26/24 0700)   linezolid  (ZYVOX ) IV Stopped (08/26/24 0327)   metronidazole  Stopped (08/25/24 2143)   micafungin  (MYCAMINE ) 100 mg in sodium chloride  0.9 % 100 mL IVPB Stopped (08/25/24 1329)   midazolam  5 mg/hr (08/26/24 0700)   promethazine  (PHENERGAN ) injection (IM or IVPB) Stopped (08/19/24 1554)   TPN ADULT (ION) 30 mL/hr at 08/26/24 0700   PRN Meds:.bisacodyl , diphenhydrAMINE , fentaNYL , hydrALAZINE , HYDROmorphone  (DILAUDID ) injection, levalbuterol , lip balm, LORazepam , metoprolol  tartrate, midazolam , ondansetron  (ZOFRAN ) IV, phenol, promethazine  (PHENERGAN ) injection (IM or IVPB), sodium chloride  flush  General appearance:Intubated. Opened her eyes when I spoke this am.  Heart: Slightly tachycardic Lungs: Diminished bibasilar breath sounds Abdomen: Soft, occasional bowel sounds present Extremities: Boots in place on both legs, mittens on hands Wound: Anterior chest wound clean, dry, no sign of infection  Lab Results: CBC: Recent Labs    08/25/24 1205 08/26/24 0334  WBC 27.0* 21.4*  HGB 7.6* 7.0*  HCT 22.0* 21.0*  PLT 242 227   BMET:  Recent Labs    08/25/24 0409 08/25/24 0735  08/26/24 0334  NA 136 138 136  K 3.9 4.1 4.1  CL 100  --  100  CO2 19*  --  18*  GLUCOSE 278*  --  165*  BUN 110*  --  144*  CREATININE 4.23*  --  5.06*  CALCIUM 8.5*  --  8.6*    CMET: Lab Results  Component Value Date   WBC 21.4 (H) 08/26/2024   HGB 7.0 (L) 08/26/2024   HCT 21.0 (L) 08/26/2024   PLT 227 08/26/2024   GLUCOSE 165 (H) 08/26/2024   TRIG 172 (H) 08/23/2024   ALT 18 08/26/2024   AST 22 08/26/2024   NA 136 08/26/2024   K 4.1 08/26/2024   CL 100 08/26/2024   CREATININE 5.06 (H)  08/26/2024   BUN 144 (H) 08/26/2024   CO2 18 (L) 08/26/2024   TSH 3.532 02/28/2022   INR 1.0 08/18/2024   HGBA1C 5.9 (H) 07/20/2024      PT/INR: No results for input(s): LABPROT, INR in the last 72 hours. Radiology: DG Abd Portable 1V Result Date: 08/25/2024 EXAM: 1 VIEW XRAY OF THE ABDOMEN 08/25/2024 04:32:00 PM COMPARISON: Film from 4:05 pm. CLINICAL HISTORY: Encounter for feeding tube placement. ICD10: 738535 Encounter for feeding tube placement; [id number] On enteral nutrition. FINDINGS: LINES, TUBES AND DEVICES: Feeding tube in place with tip in the distal gastric body. Contrast is injected, demonstrating the catheter position. BOWEL: Nonobstructive bowel gas pattern. SOFT TISSUES: No abnormal calcifications. BONES: No acute fracture. IMPRESSION: 1. Feeding tube in place with tip in the distal gastric body, confirmed by contrast injection. Electronically signed by: Oneil Devonshire MD 08/25/2024 05:25 PM EST RP Workstation: [email or web address] Abd Portable 1V Result Date: 08/25/2024 EXAM: 1 VIEW XRAY OF THE ABDOMEN 08/25/2024 04:32:00 PM COMPARISON: Film from 4:02 pm. CLINICAL HISTORY: Encounter for feeding tube placement. ICD10: 738535 Encounter for feeding tube placement; [id number] On enteral nutrition. FINDINGS: LINES, TUBES AND DEVICES: Feeding tube in place with tip over the distal stomach, relatively stable from the prior exam. BOWEL: Nonobstructive bowel gas pattern. SOFT TISSUES: No abnormal calcifications. BONES: No acute fracture. IMPRESSION: 1. Feeding tube in place with tip projecting over the distal stomach. Electronically signed by: Oneil Devonshire MD 08/25/2024 05:24 PM EST RP Workstation: [email or web address] Abd Portable 1V Result Date: 08/25/2024 EXAM: 1 VIEW XRAY OF THE ABDOMEN 08/25/2024 04:32:00 PM COMPARISON: Film from 4:01 pm. CLINICAL HISTORY: Encounter for feeding tube placement. On enteral nutrition. ICD10: [id number] Encounter for feeding tube placement; 359233 On enteral nutrition.  FINDINGS: LINES, TUBES AND DEVICES: Weighted tip enteric tube in the mid to distal stomach, slightly withdrawn when compared with the prior exam. BOWEL: Nonobstructive bowel gas pattern. SOFT TISSUES: No abnormal calcifications. BONES: No acute fracture. IMPRESSION: 1. Weighted tip enteric tube in the mid to distal stomach, slightly withdrawn compared with the prior exam. Electronically signed by: Oneil Devonshire MD 08/25/2024 05:23 PM EST RP Workstation: [email or web address]   DG Abd Portable 1V Result Date: 08/25/2024 EXAM: 1 VIEW XRAY OF THE ABDOMEN 08/25/2024 04:32:00 PM COMPARISON: Film from 4:01 pm. CLINICAL HISTORY: Encounter for feeding tube placement. ICD10: [id number] Encounter for feeding tube placement; [id number] On enteral nutrition. FINDINGS: LINES, TUBES AND DEVICES: Enteric tube in place with distal tip terminating in the distal stomach. BOWEL: Nonobstructive bowel gas pattern. SOFT TISSUES: No abnormal calcifications. BONES: No acute fracture. IMPRESSION: 1. Enteric tube in place with distal tip terminating in the distal stomach. Electronically signed by: Oneil Devonshire MD 08/25/2024 05:23 PM EST RP  Workstation: [email or web address] [id number] [email or web address]   DG Abd Portable 1V Result Date: 08/25/2024 EXAM: 1 VIEW XRAY OF THE ABDOMEN 08/25/2024 04:32:00 PM COMPARISON: Film from 4:00 pm. CLINICAL HISTORY: Encounter for feeding tube placement. ICD10: [id number] Encounter for feeding tube placement; [id number] On enteral nutrition. FINDINGS: LINES, TUBES AND DEVICES: Weighted enteric tube in place with tip in the distal stomach. LUNG BASES: Partially imaged lung bases with interstitial and airspace opacities. BOWEL: Nonobstructive bowel gas pattern. SOFT TISSUES: No abnormal calcifications. BONES: No acute fracture. IMPRESSION: 1. Weighted enteric tube in place with tip in the distal stomach. 2. Partially imaged lung bases with interstitial and airspace opacities. Electronically signed by: Oneil Devonshire MD 08/25/2024 05:22 PM EST RP Workstation: [email or web address]  Abd Portable 1V Result Date: 08/25/2024 EXAM: 1 VIEW XRAY OF THE ABDOMEN 08/25/2024 04:32:00 PM COMPARISON: Film from 04:00 pm. CLINICAL HISTORY: Encounter for feeding tube placement. On enteral nutrition. ICD10: [id number] Encounter for feeding tube placement; [id number] On enteral nutrition. FINDINGS: LINES, TUBES AND DEVICES: Weighted enteric tube in stable position. Partially visualized esophageal stent noted. BOWEL: Nonobstructive bowel gas pattern. SOFT TISSUES: No abnormal calcifications. BONES: No acute fracture. LUNG BASES: Patchy opacities in lung bases. IMPRESSION: 1. Weighted enteric tube in stable position. Electronically signed by: Oneil Devonshire MD 08/25/2024 05:21 PM EST RP Workstation: [email or web address] Abd Portable 1V Result Date: 08/25/2024 EXAM: 1 VIEW XRAY OF THE ABDOMEN 08/25/2024 04:32:00 PM COMPARISON: None available. CLINICAL HISTORY: Encounter for feeding tube placement. ICD10: [id number] Encounter for feeding tube placement; [id number] On enteral nutrition. FINDINGS: LINES, TUBES AND DEVICES: Feeding tube in place with tip in stable position, likely within the pylorus. Partially visualized midline stent noted. BOWEL: Nonobstructive bowel gas pattern. SOFT TISSUES: No abnormal calcifications. BONES: No acute fracture. LUNG BASES: Patchy opacities at lung bases. IMPRESSION: 1. Feeding tube in place with tip in stable position, likely within the pylorus. 2. Patchy opacities at the lung bases. Electronically signed by: Oneil Devonshire MD 08/25/2024 05:20 PM EST RP Workstation: [email or web address] Abd Portable 1V Result Date: 08/25/2024 EXAM: 1 VIEW XRAY OF THE ABDOMEN 08/25/2024 04:32:00 PM COMPARISON: None available. CLINICAL HISTORY: Encounter for feeding tube placement; On enteral nutrition. FINDINGS: LINES, TUBES AND DEVICES: Enteric tube in place with distal tip likely within the pyloric channel. Midline stent noted. Right chest tube in place. BOWEL: Nonobstructive bowel gas pattern. SOFT TISSUES: No abnormal  calcifications. BONES: No acute fracture. LUNGS: Bibasilar airspace disease. IMPRESSION: 1. Enteric tube in place with distal tip likely within the pyloric channel. 2. Bibasilar airspace disease. Electronically signed by: Oneil Devonshire MD 08/25/2024 05:20 PM EST RP Workstation: [email or web address] Abd Portable 1V Result Date: 08/25/2024 EXAM: 1 VIEW XRAY OF THE ABDOMEN 08/25/2024 04:32:00 PM COMPARISON: Film from 3:57 pm. CLINICAL HISTORY: Encounter for feeding tube placement. ICD10: [id number] Encounter for feeding tube placement; [id number] On enteral nutrition. FINDINGS: LINES, TUBES AND DEVICES: Feeding tube in place with tip in expected position of distal stomach directed towards the pylorus. Partially seen midline esophageal stent. LUNG BASES: Coarse lung markings at the lung bases. BOWEL: Nonobstructive bowel gas pattern. SOFT TISSUES: No abnormal calcifications. BONES: No acute fracture. IMPRESSION: 1. Feeding tube in place with tip in expected position of distal stomach directed towards the pylorus. 2. Partially seen midline esophageal stent. Electronically signed by: Oneil Devonshire MD 08/25/2024 05:19 PM EST RP Workstation: [email or web address] Abd Portable 1V Result Date: 08/25/2024 EXAM: 1 VIEW XRAY OF THE ABDOMEN 08/25/2024 04:32:00 PM COMPARISON: Film  from 3:57 pm. CLINICAL HISTORY: Encounter for feeding tube placement. ICD10: [id number] Encounter for feeding tube placement; [id number] On enteral nutrition. FINDINGS: LINES, TUBES AND DEVICES: Feeding tube in place with tip in gastric antrum. Esophageal stent noted overlying thoracic spine. LUNGS: Interstitial and airspace opacities in lung bases. BOWEL: Nonobstructive bowel gas pattern. SOFT TISSUES: No abnormal calcifications. BONES: No acute fracture. IMPRESSION: 1. Feeding tube in place with tip in gastric antrum. 2. Esophageal stent noted overlying thoracic spine. 3. Interstitial and airspace opacities in lung bases. Electronically signed by: Oneil Devonshire MD 08/25/2024  05:17 PM EST RP Workstation: [email or web address] Abd Portable 1V Result Date: 08/25/2024 EXAM: 1 VIEW XRAY OF THE ABDOMEN 08/25/2024 04:32:00 PM COMPARISON: Film from 3:56 pm. CLINICAL HISTORY: Encounter for feeding tube placement. ICD10: [id number] Encounter for feeding tube placement; [id number] On enteral nutrition. FINDINGS: LINES, TUBES AND DEVICES: Feeding tube in place with tip overlying distal stomach in stable position. LUNG BASES: Hazy lung base opacities. BOWEL: Nonobstructive bowel gas pattern. SOFT TISSUES: No abnormal calcifications. BONES: No acute fracture. IMPRESSION: 1. Feeding tube tip overlies the distal stomach in stable position. Electronically signed by: Oneil Devonshire MD 08/25/2024 05:16 PM EST RP Workstation: [email or web address] Abd Portable 1V Result Date: 08/25/2024 EXAM: 1 VIEW XRAY OF THE ABDOMEN 08/25/2024 04:32:00 PM COMPARISON: Film from earlier in the same day at 3:55 pm. CLINICAL HISTORY: Encounter for feeding tube placement. ICD10: [id number] Encounter for feeding tube placement; [id number] On enteral nutrition. FINDINGS: LINES, TUBES AND DEVICES: Weighted enteric tube in place with tip below the diaphragm in the distal stomach directed towards the pylorus. Partially visualized esophageal stent in place in the mid to distal esophagus. BOWEL: Nonobstructive bowel gas pattern. SOFT TISSUES: No abnormal calcifications. BONES: No acute fracture. IMPRESSION: 1. Weighted enteric tube in place with tip below the diaphragm in the distal stomach directed towards the pylorus. Electronically signed by: Oneil Devonshire MD 08/25/2024 05:16 PM EST RP Workstation: [email or web address] [email or web address] Abd Portable 1V Result Date: 08/25/2024 EXAM: 1 VIEW XRAY OF THE ABDOMEN 08/25/2024 04:32:00 PM COMPARISON: Chest x-ray from 08/25/2024 03:53:00 PM. CLINICAL HISTORY: Encounter for feeding tube placement. ICD10: [id number] Encounter for feeding tube placement; [id number] On enteral nutrition. FINDINGS: LINES, TUBES AND DEVICES: Weighted enteric tube  in place with tip terminating over the distal stomach. Partially imaged midline esophageal stent noted. LUNGS: Partially imaged bilateral lung base opacities. BOWEL: Nonobstructive bowel gas pattern. SOFT TISSUES: No abnormal calcifications. BONES: No acute fracture. IMPRESSION: 1. Weighted enteric tube in place with tip terminating over the distal stomach. 2. Partially imaged midline esophageal stent. Electronically signed by: Oneil Devonshire MD 08/25/2024 05:14 PM EST RP Workstation: [email or web address]   DG Chest Port 1 View Result Date: 08/25/2024 EXAM: 1 VIEW(S) XRAY OF THE CHEST 08/25/2024 04:32:00 PM COMPARISON: 08/25/2024 03:53 PM CLINICAL HISTORY: On enteral nutrition. FINDINGS: LINES, TUBES AND DEVICES: Right arm PICC with tip in expected region of superior vena cava junction. Endotracheal tube with tip 3.4 cm above carina. Enteric tube coursing below hemidiaphragm with tip and side port in place. Esophageal stent in place. LUNGS AND PLEURA: Similar-appearing diffuse bilateral airspace opacities, left greater than right. Probable trace bilateral pleural effusions. No pneumothorax. HEART AND MEDIASTINUM: Atherosclerotic plaque noted. No acute abnormality of the cardiac and mediastinal silhouettes. BONES AND SOFT TISSUES: No acute osseous abnormality. IMPRESSION: 1. Enteric tube courses below the diaphragm with tip in place. 2. Endotracheal tube tip 3.4 cm above the carina. 3. Right arm PICC tip in the  expected region of the superior cavoatrial junction. 4. Similar diffuse bilateral airspace opacities, left greater than right. 5. Probable trace bilateral pleural effusions. Electronically signed by: Oneil Devonshire MD 08/25/2024 05:13 PM EST RP Workstation: [email or web address]   DG Chest Port 1 View Result Date: 08/25/2024 EXAM: 1 VIEW(S) XRAY OF THE CHEST 08/25/2024 04:32:00 PM COMPARISON: 08/25/2024 03:52:00 PM CLINICAL HISTORY: On enteral nutrition. FINDINGS: LINES, TUBES AND DEVICES: Endotracheal tube in place with tip 4.1  cm above the carina. Esophageal stent in place in mid thoracic esophagus. Enteric tube in place with tip in upper thoracic esophagus. Stable right arm PICC. LUNGS AND PLEURA: Persistent bilateral airspace opacities, left greater than right. Small bilateral pleural effusions. No pneumothorax. HEART AND MEDIASTINUM: Aortic atherosclerosis. No acute abnormality of the cardiac and mediastinal silhouettes. BONES AND SOFT TISSUES: No acute osseous abnormality. IMPRESSION: 1. Enteric tube tip in the upper thoracic esophagus. Recommend advancement into the stomach before use. 2. Endotracheal tube tip 4.1 cm above the carina. Electronically signed by: Oneil Devonshire MD 08/25/2024 05:12 PM EST RP Workstation: [email or web address]   DG Chest Port 1 View Result Date: 08/25/2024 EXAM: 1 VIEW(S) XRAY OF THE CHEST 08/25/2024 04:32:00 PM COMPARISON: 08/25/2024 CLINICAL HISTORY: On enteral nutrition. FINDINGS: LINES, TUBES AND DEVICES: Endotracheal tube in place with tip terminating at midthoracic trachea. Right upper extremity PICC in place with tip at superior cavoatrial junction. Esophageal stent noted. Enteric tube with tip in proximal esophagus. LUNGS AND PLEURA: Persistent bilateral airspace opacities, left greater than right. Small bilateral pleural effusions. No pneumothorax. HEART AND MEDIASTINUM: Aortic atherosclerosis. No acute abnormality of the cardiac and mediastinal silhouettes. BONES AND SOFT TISSUES: No acute osseous abnormality. IMPRESSION: 1. Enteric tube tip terminates in the proximal esophagus; recommend advancement to appropriate position prior to use. 2. Persistent bilateral airspace opacities, left greater than right. 3. Small bilateral pleural effusions. Electronically signed by: Oneil Devonshire MD 08/25/2024 05:10 PM EST RP Workstation: [id number]   DG CHEST PORT 1 VIEW Result Date: 08/25/2024 EXAM: 1 VIEW(S) XRAY OF THE CHEST 08/25/2024 04:32:48 PM COMPARISON: 08/25/2024 CLINICAL HISTORY: On enteral nutrition.  FINDINGS: LINES, TUBES AND DEVICES: Esophageal Stent over midline. Right upper PICC with tip at superior junction. Endotracheal tube in place with tip 5 cm above carina. LUNGS AND PLEURA: Small bilateral pleural effusions, probable. Diffuse bilateral airspace opacities, left greater than right, increased compared to prior. No pneumothorax. HEART AND MEDIASTINUM: Atherosclerotic calcifications. No acute abnormality of the cardiac and mediastinal silhouettes. BONES AND SOFT TISSUES: No acute osseous abnormality. IMPRESSION: 1. Diffuse bilateral airspace opacities, left greater than right, increased compared to prior. 2. Small bilateral pleural effusions, probable. Electronically signed by: Elsie Gravely MD 08/25/2024 05:10 PM EST RP Workstation: [id number]     Assessment/Plan: S/P Procedures (LRB): EGD (ESOPHAGOGASTRODUODENOSCOPY) (N/A) CONTROL OF HEMORRHAGE, GI TRACT, ENDOSCOPIC, BY CLIPPING OR OVERSEWING 1. CV - SR this am. BP well controlled. On Cleviprex  drip,  Clonidine  0.2 patch, Hydralazine  10 mg IV Q 6 ,  and Nitroglycerin  patch 0.3 mg. 2.  Pulmonary - ARDS, concern for aspiration. Awaiting final cultures-?COP. Re intubated 01/27.  S/p bronchoscopy 01/27-airway defect along medial wall of the BI from mainstem bronchus. Chest tube is to water seal with scant output.  CXR this am shows bilateral patchy airspace disease L>R, and small bilateral pleural effusions. Continue Xopenex , Duo nebs. Appreciate pulmonary/CCM's assistance 4. CBGs 129/103/141. On TPN so continue accu checks. 5. Anemia-H and H this am slightly decreased to 7 and 21 6. ID-on Cefepime , Zyvox , Micafungin , Flagyl   for  esophageal leak, aspiration PNA.  WBC this am decreased to 21,400. BAL culture no growth < 24 hours, blood culture negative to date 7. GI-s/p Cortrak. Esophageal stent in place-? High in esophagus. She remains NPO, TPN 8. AKI worsening-Suspected to have ATN secondary to ischemic and nephrotoxic etiologies. Creatinine  this am slightly increased to 5.06. Nephrology following and will determine when to initiate HD   Kyla HERO ZimmermanPA-C 08/26/2024 7:28 AM  Patient seen and examined, d/w Dr. Gretta and Dr. Dolan CXR unchanged, down to 40% and lungs with good compliance, steroids if fungal stains negative Still making urine but net positive, creatinine continues to rise and CVP up to 14 Plan to start CRRT WBC down slightly  Elspeth C. Kerrin, MD [facility] [phone]

## 2024-08-26 NOTE — Progress Notes (Signed)
 [facility] NEPHROLOGY PROGRESS NOTE  Assessment/ Plan: Pt is a 70 y.o. yo female    # Acute kidney Injury: Suspected to have ATN secondary to ischemic and nephrotoxic etiologies.  Serologies negative for GN including vasculitis/anti-GBM given previous concerns of gross hematuria.  Likely ATN with continued worsening of renal function in the setting of need for diuresis for volume unloading, severe anemia with ongoing hemodynamic changes.  -Nonoliguric however patient is becoming more azotemic with no sign of renal recovery.  Also getting blood products for the management of anemia.  No improvement in respiratory status.  Plan to start CRRT after placement of catheter.  I have called patient's daughter who agreed to proceed with dialysis/CRRT.  Use all 4K bath, try to minimize heparin  because of low hemoglobin, UF goal around 50-100 cc/per hour as tolerated by BP. -Strict ins and out, monitor labs and adjust CRRT prescription as necessary.  # Esophageal leak following resection of esophageal diverticulum: Underwent placement of a new esophageal stent previous one migrated distally into the stomach.  EGD and esophagram without evidence of leak.   # Hypertensive urgency: Required Cleviprex  IV, monitor BP.  # Anemia: Getting blood transfusion.  Monitor hemoglobin.  # Acute hypoxic respiratory failure: Suspected to be multifactorial with pneumonia versus pneumonitis/acute lung injury and possibly superimposed pulmonary edema.  On broad-spectrum antibiotics with cefepime /fluconazole /linezolid  as well as ongoing diuretic.  Intubated on 1/27 and had bronchoscopy.  Discussed with ICU team and surgeon.  Subjective: Seen and examined in ICU.  Urine output is around 900.  Remains intubated, sedated.  No improvement in respiratory status.  BUN level has gone up to 144.  Reviewed with ICU team, surgery.  Patient's husband did not answer the phone therefore spoke with her daughter over the  phone.  Updated.  Objective Vital signs in last 24 hours: Vitals:   08/26/24 0915 08/26/24 0930 08/26/24 0945 08/26/24 1000  BP:      Pulse: (!) 101 98 98 99  Resp: (!) 0 (!) 0 (!) 0 (!) 21  Temp: 98.6 F (37 C) 98.6 F (37 C) 98.6 F (37 C) 98.6 F (37 C)  TempSrc:      SpO2: 95% 96% 96% 96%  Weight:      Height:       Weight change: 1.2 kg  Intake/Output Summary (Last 24 hours) at 08/26/2024 1015 Last data filed at 08/26/2024 1000 Gross per 24 hour  Intake 3810.66 ml  Output 1020 ml  Net 2790.66 ml       Labs: RENAL PANEL Recent Labs  Lab 08/20/24 0258 08/21/24 0549 08/21/24 0700 08/22/24 0222 08/23/24 0415 08/23/24 1808 08/24/24 0419 08/24/24 0505 08/24/24 1654 08/25/24 0409 08/25/24 0735 08/26/24 0334 08/26/24 0735  NA 135   < >  --  137 143   < > 141   < > 138 136 138 136 136  K 3.8   < >  --  3.8 3.9   < > 5.0   < > 3.9 3.9 4.1 4.1 4.0  CL 97*   < >  --  100 106  --  106  --   --  100  --  100  --   CO2 23   < >  --  22 23  --  21*  --   --  19*  --  18*  --   GLUCOSE 152*   < >  --  146* 128*  --  247*  --   --  278*  --  165*  --   BUN 78*   < >  --  91* 90*  --  94*  --   --  110*  --  144*  --   CREATININE 2.98*   < >  --  3.51* 3.78*  --  3.79*  --   --  4.23*  --  5.06*  --   CALCIUM 7.9*   < >  --  8.2* 8.9  --  8.9  --   --  8.5*  --  8.6*  --   MG 2.3  --   --   --  2.2  --  2.4  --   --  2.1  --  2.2  --   PHOS 3.7  --  4.3  --  3.5  --  5.2*  --   --  3.7  --  5.0*  --   ALBUMIN   --   --   --   --  2.6*  --   --   --   --  2.3*  --  2.4*  --    < > = values in this interval not displayed.    Liver Function Tests: Recent Labs  Lab 08/23/24 0415 08/25/24 0409 08/26/24 0334  AST 40 24 22  ALT 24 21 18   ALKPHOS 104 97 98  BILITOT 0.3 0.2 0.2  PROT 5.9* 5.3* 5.1*  ALBUMIN  2.6* 2.3* 2.4*   No results for input(s): LIPASE, AMYLASE in the last 168 hours. No results for input(s): AMMONIA in the last 168 hours. CBC: Recent Labs     08/25/24 0501 08/25/24 0735 08/25/24 1205 08/26/24 0334 08/26/24 0735  HGB 6.4* 6.1* 7.6* 7.0* 6.8*  MCV 90.1  --  89.1 90.5  --     Cardiac Enzymes: No results for input(s): CKTOTAL, CKMB, CKMBINDEX, TROPONINI in the last 168 hours. CBG: Recent Labs  Lab 08/25/24 1711 08/25/24 2032 08/26/24 0017 08/26/24 0332 08/26/24 0734  GLUCAP 176* 129* 103* 141* 100*    Iron Studies: No results for input(s): IRON, TIBC, TRANSFERRIN, FERRITIN in the last 72 hours. Studies/Results: DG Abd Portable 1V Result Date: 08/25/2024 EXAM: 1 VIEW XRAY OF THE ABDOMEN 08/25/2024 04:32:00 PM COMPARISON: Film from 4:05 pm. CLINICAL HISTORY: Encounter for feeding tube placement. ICD10: 738535 Encounter for feeding tube placement; [id number] On enteral nutrition. FINDINGS: LINES, TUBES AND DEVICES: Feeding tube in place with tip in the distal gastric body. Contrast is injected, demonstrating the catheter position. BOWEL: Nonobstructive bowel gas pattern. SOFT TISSUES: No abnormal calcifications. BONES: No acute fracture. IMPRESSION: 1. Feeding tube in place with tip in the distal gastric body, confirmed by contrast injection. Electronically signed by: Oneil Devonshire MD 08/25/2024 05:25 PM EST RP Workstation: [email or web address] Abd Portable 1V Result Date: 08/25/2024 EXAM: 1 VIEW XRAY OF THE ABDOMEN 08/25/2024 04:32:00 PM COMPARISON: Film from 4:02 pm. CLINICAL HISTORY: Encounter for feeding tube placement. ICD10: 738535 Encounter for feeding tube placement; 359233 On enteral nutrition. FINDINGS: LINES, TUBES AND DEVICES: Feeding tube in place with tip over the distal stomach, relatively stable from the prior exam. BOWEL: Nonobstructive bowel gas pattern. SOFT TISSUES: No abnormal calcifications. BONES: No acute fracture. IMPRESSION: 1. Feeding tube in place with tip projecting over the distal stomach. Electronically signed by: Oneil Devonshire MD 08/25/2024 05:24 PM EST RP Workstation: [email or web address]  Portable 1V Result Date: 08/25/2024 EXAM: 1 VIEW XRAY OF THE ABDOMEN 08/25/2024 04:32:00 PM COMPARISON: Film from  4:01 pm. CLINICAL HISTORY: Encounter for feeding tube placement. On enteral nutrition. ICD10: [id number] Encounter for feeding tube placement; [id number] On enteral nutrition. FINDINGS: LINES, TUBES AND DEVICES: Weighted tip enteric tube in the mid to distal stomach, slightly withdrawn when compared with the prior exam. BOWEL: Nonobstructive bowel gas pattern. SOFT TISSUES: No abnormal calcifications. BONES: No acute fracture. IMPRESSION: 1. Weighted tip enteric tube in the mid to distal stomach, slightly withdrawn compared with the prior exam. Electronically signed by: Oneil Devonshire MD 08/25/2024 05:23 PM EST RP Workstation: [email or web address]   DG Abd Portable 1V Result Date: 08/25/2024 EXAM: 1 VIEW XRAY OF THE ABDOMEN 08/25/2024 04:32:00 PM COMPARISON: Film from 4:01 pm. CLINICAL HISTORY: Encounter for feeding tube placement. ICD10: [id number] Encounter for feeding tube placement; [id number] On enteral nutrition. FINDINGS: LINES, TUBES AND DEVICES: Enteric tube in place with distal tip terminating in the distal stomach. BOWEL: Nonobstructive bowel gas pattern. SOFT TISSUES: No abnormal calcifications. BONES: No acute fracture. IMPRESSION: 1. Enteric tube in place with distal tip terminating in the distal stomach. Electronically signed by: Oneil Devonshire MD 08/25/2024 05:23 PM EST RP Workstation: [email or web address] Abd Portable 1V Result Date: 08/25/2024 EXAM: 1 VIEW XRAY OF THE ABDOMEN 08/25/2024 04:32:00 PM COMPARISON: Film from 4:00 pm. CLINICAL HISTORY: Encounter for feeding tube placement. ICD10: [id number] Encounter for feeding tube placement; [id number] On enteral nutrition. FINDINGS: LINES, TUBES AND DEVICES: Weighted enteric tube in place with tip in the distal stomach. LUNG BASES: Partially imaged lung bases with interstitial and airspace opacities. BOWEL: Nonobstructive bowel gas pattern. SOFT TISSUES: No abnormal  calcifications. BONES: No acute fracture. IMPRESSION: 1. Weighted enteric tube in place with tip in the distal stomach. 2. Partially imaged lung bases with interstitial and airspace opacities. Electronically signed by: Oneil Devonshire MD 08/25/2024 05:22 PM EST RP Workstation: [email or web address] Abd Portable 1V Result Date: 08/25/2024 EXAM: 1 VIEW XRAY OF THE ABDOMEN 08/25/2024 04:32:00 PM COMPARISON: Film from 04:00 pm. CLINICAL HISTORY: Encounter for feeding tube placement. On enteral nutrition. ICD10: [id number] Encounter for feeding tube placement; [id number] On enteral nutrition. FINDINGS: LINES, TUBES AND DEVICES: Weighted enteric tube in stable position. Partially visualized esophageal stent noted. BOWEL: Nonobstructive bowel gas pattern. SOFT TISSUES: No abnormal calcifications. BONES: No acute fracture. LUNG BASES: Patchy opacities in lung bases. IMPRESSION: 1. Weighted enteric tube in stable position. Electronically signed by: Oneil Devonshire MD 08/25/2024 05:21 PM EST RP Workstation: [email or web address] Abd Portable 1V Result Date: 08/25/2024 EXAM: 1 VIEW XRAY OF THE ABDOMEN 08/25/2024 04:32:00 PM COMPARISON: None available. CLINICAL HISTORY: Encounter for feeding tube placement. ICD10: [id number] Encounter for feeding tube placement; [id number] On enteral nutrition. FINDINGS: LINES, TUBES AND DEVICES: Feeding tube in place with tip in stable position, likely within the pylorus. Partially visualized midline stent noted. BOWEL: Nonobstructive bowel gas pattern. SOFT TISSUES: No abnormal calcifications. BONES: No acute fracture. LUNG BASES: Patchy opacities at lung bases. IMPRESSION: 1. Feeding tube in place with tip in stable position, likely within the pylorus. 2. Patchy opacities at the lung bases. Electronically signed by: Oneil Devonshire MD 08/25/2024 05:20 PM EST RP Workstation: [email or web address] Abd Portable 1V Result Date: 08/25/2024 EXAM: 1 VIEW XRAY OF THE ABDOMEN 08/25/2024 04:32:00 PM COMPARISON: None available. CLINICAL  HISTORY: Encounter for feeding tube placement; On enteral nutrition. FINDINGS: LINES, TUBES AND DEVICES: Enteric tube in place with distal tip likely within the pyloric channel. Midline stent noted. Right chest tube in place. BOWEL: Nonobstructive bowel gas pattern. SOFT TISSUES: No  abnormal calcifications. BONES: No acute fracture. LUNGS: Bibasilar airspace disease. IMPRESSION: 1. Enteric tube in place with distal tip likely within the pyloric channel. 2. Bibasilar airspace disease. Electronically signed by: Oneil Devonshire MD 08/25/2024 05:20 PM EST RP Workstation: [email or web address] Abd Portable 1V Result Date: 08/25/2024 EXAM: 1 VIEW XRAY OF THE ABDOMEN 08/25/2024 04:32:00 PM COMPARISON: Film from 3:57 pm. CLINICAL HISTORY: Encounter for feeding tube placement. ICD10: [id number] Encounter for feeding tube placement; [id number] On enteral nutrition. FINDINGS: LINES, TUBES AND DEVICES: Feeding tube in place with tip in expected position of distal stomach directed towards the pylorus. Partially seen midline esophageal stent. LUNG BASES: Coarse lung markings at the lung bases. BOWEL: Nonobstructive bowel gas pattern. SOFT TISSUES: No abnormal calcifications. BONES: No acute fracture. IMPRESSION: 1. Feeding tube in place with tip in expected position of distal stomach directed towards the pylorus. 2. Partially seen midline esophageal stent. Electronically signed by: Oneil Devonshire MD 08/25/2024 05:19 PM EST RP Workstation: [email or web address] [email or web address] Abd Portable 1V Result Date: 08/25/2024 EXAM: 1 VIEW XRAY OF THE ABDOMEN 08/25/2024 04:32:00 PM COMPARISON: Film from 3:57 pm. CLINICAL HISTORY: Encounter for feeding tube placement. ICD10: [id number] Encounter for feeding tube placement; [id number] On enteral nutrition. FINDINGS: LINES, TUBES AND DEVICES: Feeding tube in place with tip in gastric antrum. Esophageal stent noted overlying thoracic spine. LUNGS: Interstitial and airspace opacities in lung bases. BOWEL: Nonobstructive bowel gas pattern.  SOFT TISSUES: No abnormal calcifications. BONES: No acute fracture. IMPRESSION: 1. Feeding tube in place with tip in gastric antrum. 2. Esophageal stent noted overlying thoracic spine. 3. Interstitial and airspace opacities in lung bases. Electronically signed by: Oneil Devonshire MD 08/25/2024 05:17 PM EST RP Workstation: [email or web address] [email or web address] Abd Portable 1V Result Date: 08/25/2024 EXAM: 1 VIEW XRAY OF THE ABDOMEN 08/25/2024 04:32:00 PM COMPARISON: Film from 3:56 pm. CLINICAL HISTORY: Encounter for feeding tube placement. ICD10: [id number] Encounter for feeding tube placement; [id number] On enteral nutrition. FINDINGS: LINES, TUBES AND DEVICES: Feeding tube in place with tip overlying distal stomach in stable position. LUNG BASES: Hazy lung base opacities. BOWEL: Nonobstructive bowel gas pattern. SOFT TISSUES: No abnormal calcifications. BONES: No acute fracture. IMPRESSION: 1. Feeding tube tip overlies the distal stomach in stable position. Electronically signed by: Oneil Devonshire MD 08/25/2024 05:16 PM EST RP Workstation: [email or web address] [email or web address] Abd Portable 1V Result Date: 08/25/2024 EXAM: 1 VIEW XRAY OF THE ABDOMEN 08/25/2024 04:32:00 PM COMPARISON: Film from earlier in the same day at 3:55 pm. CLINICAL HISTORY: Encounter for feeding tube placement. ICD10: [id number] Encounter for feeding tube placement; [id number] On enteral nutrition. FINDINGS: LINES, TUBES AND DEVICES: Weighted enteric tube in place with tip below the diaphragm in the distal stomach directed towards the pylorus. Partially visualized esophageal stent in place in the mid to distal esophagus. BOWEL: Nonobstructive bowel gas pattern. SOFT TISSUES: No abnormal calcifications. BONES: No acute fracture. IMPRESSION: 1. Weighted enteric tube in place with tip below the diaphragm in the distal stomach directed towards the pylorus. Electronically signed by: Oneil Devonshire MD 08/25/2024 05:16 PM EST RP Workstation: [email or web address] [email or web address] Abd Portable 1V Result Date: 08/25/2024 EXAM: 1 VIEW  XRAY OF THE ABDOMEN 08/25/2024 04:32:00 PM COMPARISON: Chest x-ray from 08/25/2024 03:53:00 PM. CLINICAL HISTORY: Encounter for feeding tube placement. ICD10: [id number] Encounter for feeding tube placement; [id number] On enteral nutrition. FINDINGS: LINES, TUBES AND DEVICES: Weighted enteric tube in place with tip terminating over the distal stomach. Partially imaged midline esophageal stent noted. LUNGS: Partially imaged bilateral lung  base opacities. BOWEL: Nonobstructive bowel gas pattern. SOFT TISSUES: No abnormal calcifications. BONES: No acute fracture. IMPRESSION: 1. Weighted enteric tube in place with tip terminating over the distal stomach. 2. Partially imaged midline esophageal stent. Electronically signed by: Oneil Devonshire MD 08/25/2024 05:14 PM EST RP Workstation: [email or web address]   DG Chest Port 1 View Result Date: 08/25/2024 EXAM: 1 VIEW(S) XRAY OF THE CHEST 08/25/2024 04:32:00 PM COMPARISON: 08/25/2024 03:53 PM CLINICAL HISTORY: On enteral nutrition. FINDINGS: LINES, TUBES AND DEVICES: Right arm PICC with tip in expected region of superior vena cava junction. Endotracheal tube with tip 3.4 cm above carina. Enteric tube coursing below hemidiaphragm with tip and side port in place. Esophageal stent in place. LUNGS AND PLEURA: Similar-appearing diffuse bilateral airspace opacities, left greater than right. Probable trace bilateral pleural effusions. No pneumothorax. HEART AND MEDIASTINUM: Atherosclerotic plaque noted. No acute abnormality of the cardiac and mediastinal silhouettes. BONES AND SOFT TISSUES: No acute osseous abnormality. IMPRESSION: 1. Enteric tube courses below the diaphragm with tip in place. 2. Endotracheal tube tip 3.4 cm above the carina. 3. Right arm PICC tip in the expected region of the superior cavoatrial junction. 4. Similar diffuse bilateral airspace opacities, left greater than right. 5. Probable trace bilateral pleural effusions. Electronically signed by: Oneil Devonshire MD 08/25/2024 05:13 PM  EST RP Workstation: [email or web address]   DG Chest Port 1 View Result Date: 08/25/2024 EXAM: 1 VIEW(S) XRAY OF THE CHEST 08/25/2024 04:32:00 PM COMPARISON: 08/25/2024 03:52:00 PM CLINICAL HISTORY: On enteral nutrition. FINDINGS: LINES, TUBES AND DEVICES: Endotracheal tube in place with tip 4.1 cm above the carina. Esophageal stent in place in mid thoracic esophagus. Enteric tube in place with tip in upper thoracic esophagus. Stable right arm PICC. LUNGS AND PLEURA: Persistent bilateral airspace opacities, left greater than right. Small bilateral pleural effusions. No pneumothorax. HEART AND MEDIASTINUM: Aortic atherosclerosis. No acute abnormality of the cardiac and mediastinal silhouettes. BONES AND SOFT TISSUES: No acute osseous abnormality. IMPRESSION: 1. Enteric tube tip in the upper thoracic esophagus. Recommend advancement into the stomach before use. 2. Endotracheal tube tip 4.1 cm above the carina. Electronically signed by: Oneil Devonshire MD 08/25/2024 05:12 PM EST RP Workstation: [email or web address]   DG Chest Port 1 View Result Date: 08/25/2024 EXAM: 1 VIEW(S) XRAY OF THE CHEST 08/25/2024 04:32:00 PM COMPARISON: 08/25/2024 CLINICAL HISTORY: On enteral nutrition. FINDINGS: LINES, TUBES AND DEVICES: Endotracheal tube in place with tip terminating at midthoracic trachea. Right upper extremity PICC in place with tip at superior cavoatrial junction. Esophageal stent noted. Enteric tube with tip in proximal esophagus. LUNGS AND PLEURA: Persistent bilateral airspace opacities, left greater than right. Small bilateral pleural effusions. No pneumothorax. HEART AND MEDIASTINUM: Aortic atherosclerosis. No acute abnormality of the cardiac and mediastinal silhouettes. BONES AND SOFT TISSUES: No acute osseous abnormality. IMPRESSION: 1. Enteric tube tip terminates in the proximal esophagus; recommend advancement to appropriate position prior to use. 2. Persistent bilateral airspace opacities, left greater than right. 3. Small  bilateral pleural effusions. Electronically signed by: Oneil Devonshire MD 08/25/2024 05:10 PM EST RP Workstation: [id number]   DG CHEST PORT 1 VIEW Result Date: 08/25/2024 EXAM: 1 VIEW(S) XRAY OF THE CHEST 08/25/2024 04:32:48 PM COMPARISON: 08/25/2024 CLINICAL HISTORY: On enteral nutrition. FINDINGS: LINES, TUBES AND DEVICES: Esophageal Stent over midline. Right upper PICC with tip at superior junction. Endotracheal tube in place with tip 5 cm above carina. LUNGS AND PLEURA: Small bilateral pleural effusions, probable. Diffuse bilateral airspace opacities, left greater than right, increased compared to prior. No pneumothorax. HEART AND MEDIASTINUM:  Atherosclerotic calcifications. No acute abnormality of the cardiac and mediastinal silhouettes. BONES AND SOFT TISSUES: No acute osseous abnormality. IMPRESSION: 1. Diffuse bilateral airspace opacities, left greater than right, increased compared to prior. 2. Small bilateral pleural effusions, probable. Electronically signed by: Elsie Gravely MD 08/25/2024 05:10 PM EST RP Workstation: [id number]   DG CHEST PORT 1 VIEW Result Date: 08/25/2024 CLINICAL DATA:  Pneumonia EXAM: PORTABLE CHEST 1 VIEW COMPARISON:  Prior chest x-ray 08/24/2024 FINDINGS: The patient remains intubated. The tip of the endotracheal tube is 4.6 cm above the carina. An esophageal stent is in place. Similar appearance of diffuse bilateral interstitial and airspace opacities more confluent on the left than the right. There may be some slight interval clearing in the right upper lung. Nodular opacity in the right lower lobe again noted measuring approximately 1.8 cm. Probable small right pleural effusion. No evidence of pneumothorax. IMPRESSION: 1. Stable and satisfactory support apparatus. 2. Slight interval clearing of patchy airspace opacities from the right upper lung. Otherwise, similar diffuse bilateral patchy interstitial and airspace opacities more confluent on the left than the right. 3.  Probable small right pleural effusion. Electronically Signed   By: Wilkie Lent M.D.   On: 08/25/2024 09:22    Medications: Infusions:  acetaminophen      Followed by   NOREEN ON 08/27/2024] acetaminophen      ceFEPime  (MAXIPIME ) IV Stopped (08/25/24 1429)   clevidipine  7 mg/hr (08/26/24 1000)   famotidine  (PEPCID ) IV Stopped (08/25/24 1148)   fentaNYL  infusion INTRAVENOUS 250 mcg/hr (08/26/24 1000)   linezolid  (ZYVOX ) IV Stopped (08/26/24 0327)   metronidazole  Stopped (08/26/24 0836)   micafungin  (MYCAMINE ) 100 mg in sodium chloride  0.9 % 100 mL IVPB Stopped (08/25/24 1329)   midazolam  5 mg/hr (08/26/24 1000)   promethazine  (PHENERGAN ) injection (IM or IVPB) Stopped (08/19/24 1554)   propofol  (DIPRIVAN ) infusion 20 mcg/kg/min (08/26/24 1000)   TPN ADULT (ION) 30 mL/hr at 08/26/24 1000    Scheduled Medications:  sodium chloride    Intravenous Once   Chlorhexidine  Gluconate Cloth  6 each Topical Daily   cloNIDine   0.2 mg Transdermal Weekly   fentaNYL   1 patch Transdermal Q72H   hydrALAZINE   10 mg Intravenous Q6H   insulin  aspart  0-20 Units Subcutaneous Q4H   insulin  glargine  26 Units Subcutaneous Daily   ipratropium-albuterol   3 mL Nebulization TID   lidocaine   2 patch Transdermal Q24H   metoCLOPramide  (REGLAN ) injection  10 mg Intravenous Q6H   neomycin -bacitracin -polymyxin   Topical BID   nitroGLYCERIN   0.3 mg Transdermal Daily   mouth rinse  15 mL Mouth Rinse Q2H   sodium chloride  flush  10-40 mL Intracatheter Q12H    have reviewed scheduled and prn medications.  Physical Exam: General: Intubated, sedated. Heart:RRR, s1s2 nl Lungs: Diffuse Rales and crackles. Abdomen:soft, Non-tender, non-distended Extremities:No edema Neurology: Sedated.  Jahnyla Parrillo Prasad Ronson Hagins 08/26/2024,10:15 AM  LOS: 31 days

## 2024-08-26 NOTE — Progress Notes (Signed)
 Nutrition Follow-up  DOCUMENTATION CODES:   Severe malnutrition in context of acute illness/injury  INTERVENTION:   Continue TPN to meet nutritional needs -Cleviprex  rate down by almost half, propofol  started;  recommend increasing carbohydrate delivery as able, even if have to decrease protein delivery -Discussed TPN formulation for today with TPN Pharmacist; plan to increase carbohydrate  Tube Feeding Recommendations once ready to initiate: Vital AF 1.2 at rate of 20 ml/hr with goal rate of 60 ml/hr TF at goal provides 1728 kcals, 108 g of protein and 1166 mL of free water  Once able to use enteral route, plan to add renal MVI daily to account for water soluble vitamin losses via CRRT  NUTRITION DIAGNOSIS:   Severe Malnutrition related to acute illness as evidenced by moderate muscle depletion, energy intake < or equal to 50% for > or equal to 5 days.  Continues but being addressed via TPN  GOAL:   Patient will meet greater than or equal to 90% of their needs  Met via nutrition support  MONITOR:   Diet advancement, Supplement acceptance, PO intake, Weight trends  REASON FOR ASSESSMENT:   Consult New TPN/TNA  ASSESSMENT:   Pt with PMH significant for: T2DM, HLD, HTN, sleep apnea, anxiety and depression. 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 Now s/p right middle lobectomy and resection of esophageal diverticulum.  12/29 admitted; OR: middle lobectomy and resection of esophageal diverticulum 12/30 esophagram: small contained esophageal leak 12/31 PICC placed; TPN started 1/06 aspirated contrast - esophagram unable to be completed 1/07 esophageal stent placed; TPN at goal 1/08 diet adv CLD  1/09 reports of excessive coughing; esophageal stent found to be in wrong position 1/10 adjustment to esophageal stent 1/12 repeat esophagram; diet advanced to clear liquid diet 1/13 diet advanced DYS1/thins - increased  coughing 1/14 NPO; esophageal stent migration 1/15 CT: displaced esophageal stent, small R pleural effusion 1/16 EGD w/ stent replacement, bronch 1/17 TPN restarted 1/20 EGD and Esophogram with no signs of leak, diet advanced to CL 1/21 Diet adv to FL, +nausea 1/22 Diet adv to Regular 1/24 Back to strict NPO, worsening respiratory status, concern for aspiration  1/26 Intubated 1/27 Bronch- airway defect 1/28 Cortrak tube placed by Dr. Daniel using serial xrays (not Cortrak technology), tip gastric  Pt remains on vent support Cleviprex  at 10-14 ml/hr (480-672 kcals) Starting propofol  as well   TPN at 30 ml/hr: Provides 100 g of protein, 30 g of carbohydrates and 500 kcals in 720 mL volume. Additional lipid calorie provision via Cleviprex  infusion. Currently no electrolytes added.   10 french small bore feeding tube (utilized Cortrak tube) placed by Dr. Daniel yesterday using serial xray technique   Noted IV reglan  has been started  Worsening AKI, BUN/Creatinine rising. BUN:Cr ratio 29. Nephrology following, noted plan to start CRRT. UOP 940 mL in 24 hours Hgb dropped to 6.8. Also on solumedrol  Current Weight: 85.4 kg-weight trending up with decreased UOP Admission Weight: 70.3 kg Estimated Dry Weight: unknown at present; edema noted on exam  GI: Abdomen soft, BS present, +flatus. 2 small type 6 BMs on 1/26, 2 BMs on 1/21 (1 large type 6, medium type 7-bloody from hemorrhoids)   Lines/Drains: R double lumen PICC (07/28/24) R Pleural Chest tube 10 F Cortrak (gastric)  Labs: CBGs 103-141 Sodium 136 (wdl) BUN 90>94>110>144 Creatinine 3.78>3.79>4.23>5.06 Phosphorus 5.0 (H) Magnesium  2.0 (wdl) TG 172 (on 1/26)  Meds: SS novolog  Lantus  Reglan    Diet Order:  Diet Order             Diet NPO time specified  Diet effective now                   EDUCATION NEEDS:   Education needs have been addressed  Skin:  Skin Assessment: Reviewed RN Assessment  Last BM:  1/25  Small BM x 1 (last BM prior to this 1/21)  Height:   Ht Readings from Last 1 Encounters:  08/24/24 5' 3 (1.6 m)    Weight:   Wt Readings from Last 1 Encounters:  08/26/24 85.4 kg    Ideal Body Weight:  52.3 kg  BMI:  Body mass index is 33.35 kg/m.  Estimated Nutritional Needs:   Kcal:  1700-1900  Protein:  100-115g  Fluid:  >1.7L/day   Betsey Finger MS, RDN, LDN, CNSC Registered Dietitian 3 Clinical Nutrition RD Inpatient Contact Info in Amion

## 2024-08-26 NOTE — Progress Notes (Signed)
 PT Cancellation Note  Patient Details Name: [name] MRN: [id number] DOB: 1956/03/23   Cancelled Treatment:    Reason Eval/Treat Not Completed: (P) Patient not medically ready Pt is being placed on CRRT. PT will follow back another day for Re-Evaluation.  Lakeisa Heninger B. Fleeta Lapidus PT, DPT Acute Rehabilitation Services Please use secure chat or  Call Office [phone]    Almarie KATHEE Fleeta [facility] 08/26/2024, 8:41 AM

## 2024-08-26 NOTE — Progress Notes (Addendum)
 PHARMACY - TOTAL PARENTERAL NUTRITION CONSULT NOTE  Indication: Esophageal leak  Patient Measurements: Height: 5' 3 (160 cm) Weight: 85.4 kg (188 lb 4.4 oz) IBW/kg (Calculated) : 52.4 TPN AdjBW (KG): 55.8 Body mass index is 33.35 kg/m.  Assessment:  31 YOF with NSCLC here for RML lobectomy, resection of esophageal diverticulum, and lymph node dissection on 12/29.  Found to have a small contained leak post-op.   Patient reports an intentional weight loss of 40 lbs over a couple of years when she was diagnosed with DM.  She typically drinks one Orgain shake every morning for breakfast and eats one meal a day, which could be pizza, spaghetti, chicken, beef or fish along with vegetables and fruits.  Patient was eating normally until her planned surgery. RD concerned with patient being malnourished. Patient was on TPN 12/31 - 1/12 and was advanced to dysphagia 1 diet. However, she was NPO again 1/14-1/16. On 1/17 IR reported small amount of leak from distal esophageal stent. Patient developed AKI since TPN was held. Pharmacy reconsulted for TPN 1/17.  Patient intubated overnight on 1/27. Currently on propofol  and cleviprex . Trial of nicardipine  on 1/26 without adequate BP control. Requiring propofol  this AM for sedation, potential extubation on 1/30 so team would like to avoid versed . Precedex likely with inadequate effect for full wean. Potential for Jtube / PEG at the end of the week per TCTS.   Glucose / Insulin : hx DM, A1c 5.9% - BG 100-181, 18 units SSI/24h, glargine 35units/24hr per TCTS, insulin  removed from TPN on 1/27 (previously 20u, was stopped when TPN was adjusted for cleviprex )   Electrolytes: Na 136, K 4.1, CL 100, CO2 18 (max acetate 1/27, 150mEq sodium bicarb in past 24h), Phos: 5.0 (removed from TPN 1/27), Mg: 2.2 (removed from TPN 1/27), CoCa: 9.9, ical: 1.20 Renal: AKI - SCr 5.06 up (BL 0.8), BUN 144 - starting CRRT today  Hepatic: 1/29- LFTs / tbili WNL, albumin  2.4, TG 172  1/26 (noted clevidipine  added on 1/26)  Intake / Output; MIVF: UOP (0.5 ml/kg/hr), LBM 1/26, chest tube 10 mL , last lasix  1/24, reglan  10 Q6H  Net IO Since Admission: 27,692.71 mL [08/26/24 0836]  GI Imaging:   12/30 esophagram: Positive for contrast extravasation and an esophageal leak in the mid esophagus 1/2 esophagram: Persistent contrast extravasation and esophageal leak, similar to prior 1/6 esophagram: Limited contrast study due to patient aspiration and severe coughing spell. Study aborted. 1/12 esophagram: Patent esophageal stent. No evidence for extravasation or leak, Delayed esophageal emptying and gastroesophageal reflux leading to stasis of contrast in the esophagus. 1/15 CT: displaced esophageal stent in stomach, no bowel obstruction  1/17 esophagram: small volume esophageal leak at distal stent 1/20 esophagram: no evidence of any leak  GI Surgeries / Procedures:  12/30 right middle lobectomy, resection esophageal diverticulum, lymph node biopsy  1/7 EGD- stent placement  1/9 EGD stent repositioned d/t migration to stomach  1/16 EGD- stent placed at esophageal ulcer, removed gastric stent, normal duoenum  1/19 EGD - stent replaced / repositioned   Central access: PICC 07/28/24 TPN start date: 07/28/24 > 1/12; 1/17 >>   Nutritional Goals:  Goal unconcentrated TPN rate 75 ml/hr to provide 101g AA and 1721 kCal Goal concentrated TPN rate 60 mL/hr to provide 101 g AA and 1743 kCal  RD Estimated Needs Total Energy Estimated Needs: 1700-1900 Total Protein Estimated Needs: 100-115g Total Fluid Estimated Needs: >1.7L/day  Current Nutrition:  TPN 1/17 >> 1/08 CLD - passed swallow 1/09  CLD - patient feels can't swallow well > NPO 1/13 advanced to DYS1 1/14 NPO  1/21 FLD, Glucerna x1 on 1/21, milkshake per patient  1/24 NPO    Plan: Continue adjusted TPN to meeting kcal needs while taking into account cleviprex . Cleviprex  infusing at 5-7mg /hr (10-42mL/hr) for the  past several hours. Planning to attempt to wean in the next 24 hours using hydralazine  and clonidine . Total cleviprex  volume in 24 hours = 240-37mL, =480-670kcal from cleviprex .  Propofol  added this morning per CCM for potential extubation - unable to do any other sedatives. Infusing at 20mcg/kg/min = ~ 39mL/hr. Total volume in 24 hours = 260kcal. Total kcal from cleviprex  and propofol  = ~ 740 to 910.   Concentrated TPN at 30mL/hr @ 18:00. Will provide 110g of protein (increased with CRRT starting) and 946kcal (148g CHO). With cleviprex  and propofol  will be meeting approximately 100% of nutritional needs (1686-1856kcal).   Electrolytes in TPN: Na 0 mEq/L, K 0 mEq/L, Ca 0 mEq/L, Mg 0 mEq/L, Phos 0 mmol/L. Cl:Ac max acetate. Starting CRRT on 1/29 - will likely add Na and phos to TPN on 1/30 if able to increase electrolytes and TPN volume pending cleviprex  and propofol  contribution.  Add standard MVI and trace elements to TPN Continue resistant SSI to q4h and adjust as needed Insulin  glargine decreased to 26 units QD per TCTS.  Monitor TPN labs on Mon/Thurs, and daily given AKI. Recheck TG in the AM.   Thank you for allowing pharmacy to be a part of this patients care.  Powell Blush, PharmD, BCCCP  Clinical Pharmacist

## 2024-08-27 ENCOUNTER — Inpatient Hospital Stay (HOSPITAL_COMMUNITY)

## 2024-08-27 DIAGNOSIS — D649 Anemia, unspecified: Secondary | ICD-10-CM

## 2024-08-27 DIAGNOSIS — N170 Acute kidney failure with tubular necrosis: Secondary | ICD-10-CM | POA: Diagnosis not present

## 2024-08-27 DIAGNOSIS — C342 Malignant neoplasm of middle lobe, bronchus or lung: Secondary | ICD-10-CM | POA: Diagnosis not present

## 2024-08-27 DIAGNOSIS — I161 Hypertensive emergency: Secondary | ICD-10-CM | POA: Diagnosis not present

## 2024-08-27 DIAGNOSIS — E8770 Fluid overload, unspecified: Secondary | ICD-10-CM | POA: Diagnosis not present

## 2024-08-27 DIAGNOSIS — E43 Unspecified severe protein-calorie malnutrition: Secondary | ICD-10-CM | POA: Diagnosis not present

## 2024-08-27 DIAGNOSIS — E1165 Type 2 diabetes mellitus with hyperglycemia: Secondary | ICD-10-CM

## 2024-08-27 DIAGNOSIS — Z794 Long term (current) use of insulin: Secondary | ICD-10-CM | POA: Diagnosis not present

## 2024-08-27 DIAGNOSIS — K223 Perforation of esophagus: Secondary | ICD-10-CM

## 2024-08-27 DIAGNOSIS — J9601 Acute respiratory failure with hypoxia: Secondary | ICD-10-CM | POA: Diagnosis not present

## 2024-08-27 LAB — RENAL FUNCTION PANEL
Albumin: 2.6 g/dL — ABNORMAL LOW (ref 3.5–5.0)
Albumin: 2.6 g/dL — ABNORMAL LOW (ref 3.5–5.0)
Anion gap: 13 (ref 5–15)
Anion gap: 11 (ref 5–15)
BUN: 83 mg/dL — ABNORMAL HIGH (ref 8–23)
BUN: 67 mg/dL — ABNORMAL HIGH (ref 8–23)
CO2: 22 mmol/L (ref 22–32)
CO2: 23 mmol/L (ref 22–32)
Calcium: 7.9 mg/dL — ABNORMAL LOW (ref 8.9–10.3)
Calcium: 7.9 mg/dL — ABNORMAL LOW (ref 8.9–10.3)
Chloride: 100 mmol/L (ref 98–111)
Chloride: 100 mmol/L (ref 98–111)
Creatinine, Ser: 2.47 mg/dL — ABNORMAL HIGH (ref 0.44–1.00)
Creatinine, Ser: 1.91 mg/dL — ABNORMAL HIGH (ref 0.44–1.00)
GFR, Estimated: 21 mL/min — ABNORMAL LOW
GFR, Estimated: 28 mL/min — ABNORMAL LOW
Glucose, Bld: 117 mg/dL — ABNORMAL HIGH (ref 70–99)
Glucose, Bld: 171 mg/dL — ABNORMAL HIGH (ref 70–99)
Phosphorus: 2.9 mg/dL (ref 2.5–4.6)
Phosphorus: 2.8 mg/dL (ref 2.5–4.6)
Potassium: 3.7 mmol/L (ref 3.5–5.1)
Potassium: 4 mmol/L (ref 3.5–5.1)
Sodium: 134 mmol/L — ABNORMAL LOW (ref 135–145)
Sodium: 133 mmol/L — ABNORMAL LOW (ref 135–145)

## 2024-08-27 LAB — CBC
HCT: 27.2 % — ABNORMAL LOW (ref 36.0–46.0)
Hemoglobin: 9.2 g/dL — ABNORMAL LOW (ref 12.0–15.0)
MCH: 30.9 pg (ref 26.0–34.0)
MCHC: 33.8 g/dL (ref 30.0–36.0)
MCV: 91.3 fL (ref 80.0–100.0)
Platelets: 218 10*3/uL (ref 150–400)
RBC: 2.98 MIL/uL — ABNORMAL LOW (ref 3.87–5.11)
RDW: 15.9 % — ABNORMAL HIGH (ref 11.5–15.5)
WBC: 18.2 10*3/uL — ABNORMAL HIGH (ref 4.0–10.5)
nRBC: 0.2 % (ref 0.0–0.2)

## 2024-08-27 LAB — CULTURE, BLOOD (ROUTINE X 2)
Culture: NO GROWTH
Culture: NO GROWTH
Special Requests: ADEQUATE

## 2024-08-27 LAB — TYPE AND SCREEN
ABO/RH(D): A POS
Antibody Screen: NEGATIVE
Unit division: 0
Unit division: 0

## 2024-08-27 LAB — BPAM RBC
Blood Product Expiration Date: 202602092359
Blood Product Expiration Date: 202602182359
ISSUE DATE / TIME: 202601280902
ISSUE DATE / TIME: 202601290847
Unit Type and Rh: 6200
Unit Type and Rh: 6200

## 2024-08-27 LAB — COMPREHENSIVE METABOLIC PANEL WITH GFR
ALT: 19 U/L (ref 0–44)
AST: 23 U/L (ref 15–41)
Albumin: 2.6 g/dL — ABNORMAL LOW (ref 3.5–5.0)
Alkaline Phosphatase: 103 U/L (ref 38–126)
Anion gap: 12 (ref 5–15)
BUN: 84 mg/dL — ABNORMAL HIGH (ref 8–23)
CO2: 22 mmol/L (ref 22–32)
Calcium: 8 mg/dL — ABNORMAL LOW (ref 8.9–10.3)
Chloride: 100 mmol/L (ref 98–111)
Creatinine, Ser: 2.45 mg/dL — ABNORMAL HIGH (ref 0.44–1.00)
GFR, Estimated: 21 mL/min — ABNORMAL LOW
Glucose, Bld: 119 mg/dL — ABNORMAL HIGH (ref 70–99)
Potassium: 3.7 mmol/L (ref 3.5–5.1)
Sodium: 134 mmol/L — ABNORMAL LOW (ref 135–145)
Total Bilirubin: 0.2 mg/dL (ref 0.0–1.2)
Total Protein: 5.5 g/dL — ABNORMAL LOW (ref 6.5–8.1)

## 2024-08-27 LAB — ASPERGILLUS ANTIGEN, BAL/SERUM: Aspergillus Ag, BAL/Serum: 0.07 {index} (ref 0.00–0.49)

## 2024-08-27 LAB — PHOSPHORUS: Phosphorus: 2.9 mg/dL (ref 2.5–4.6)

## 2024-08-27 LAB — GLUCOSE, CAPILLARY
Glucose-Capillary: 100 mg/dL — ABNORMAL HIGH (ref 70–99)
Glucose-Capillary: 111 mg/dL — ABNORMAL HIGH (ref 70–99)
Glucose-Capillary: 124 mg/dL — ABNORMAL HIGH (ref 70–99)
Glucose-Capillary: 132 mg/dL — ABNORMAL HIGH (ref 70–99)
Glucose-Capillary: 134 mg/dL — ABNORMAL HIGH (ref 70–99)
Glucose-Capillary: 138 mg/dL — ABNORMAL HIGH (ref 70–99)
Glucose-Capillary: 151 mg/dL — ABNORMAL HIGH (ref 70–99)

## 2024-08-27 LAB — TRIGLYCERIDES: Triglycerides: 375 mg/dL — ABNORMAL HIGH

## 2024-08-27 LAB — MAGNESIUM: Magnesium: 2.2 mg/dL (ref 1.7–2.4)

## 2024-08-27 MED ORDER — DEXMEDETOMIDINE HCL IN NACL 400 MCG/100ML IV SOLN
0.0000 ug/kg/h | INTRAVENOUS | Status: DC
  Administered 2024-08-27: 0.9 ug/kg/h via INTRAVENOUS
  Administered 2024-08-27: 0.4 ug/kg/h via INTRAVENOUS
  Administered 2024-08-27: 0.8 ug/kg/h via INTRAVENOUS
  Administered 2024-08-28: 0.9 ug/kg/h via INTRAVENOUS
  Filled 2024-08-27 (×4): qty 100

## 2024-08-27 MED ORDER — TRACE MINERALS CU-MN-SE-ZN 300-55-60-3000 MCG/ML IV SOLN
INTRAVENOUS | Status: AC
  Filled 2024-08-27: qty 734.4

## 2024-08-27 MED ORDER — ACETAMINOPHEN 10 MG/ML IV SOLN
1000.0000 mg | Freq: Four times a day (QID) | INTRAVENOUS | Status: AC
  Administered 2024-08-29 – 2024-08-30 (×4): 1000 mg via INTRAVENOUS
  Filled 2024-08-27 (×4): qty 100

## 2024-08-27 MED ORDER — LINEZOLID 600 MG/300ML IV SOLN
600.0000 mg | Freq: Two times a day (BID) | INTRAVENOUS | Status: AC
  Administered 2024-08-28: 600 mg via INTRAVENOUS
  Filled 2024-08-27: qty 300

## 2024-08-27 MED ORDER — ACETAMINOPHEN 10 MG/ML IV SOLN
1000.0000 mg | Freq: Four times a day (QID) | INTRAVENOUS | Status: AC
  Administered 2024-08-28 – 2024-08-29 (×4): 1000 mg via INTRAVENOUS
  Filled 2024-08-27 (×4): qty 100

## 2024-08-27 MED ORDER — ACETAMINOPHEN 10 MG/ML IV SOLN
1000.0000 mg | Freq: Four times a day (QID) | INTRAVENOUS | Status: AC
  Administered 2024-08-27 – 2024-08-28 (×3): 1000 mg via INTRAVENOUS
  Filled 2024-08-27 (×3): qty 100

## 2024-08-27 NOTE — Progress Notes (Signed)
 [facility] NEPHROLOGY PROGRESS NOTE  Assessment/ Plan: Pt is a 69 y.o. yo female    # Acute kidney Injury: Suspected to have ATN secondary to ischemic and nephrotoxic etiologies.  Serologies negative for GN including vasculitis/anti-GBM given previous concerns of gross hematuria.  Likely ATN with continued worsening of renal function in the setting of need for diuresis for volume unloading, severe anemia with ongoing hemodynamic changes.  -Started CRRT on 1/29 for azotemia.  Tolerating CRRT well, UF around 50 cc an hour, monitor lab and adjust CRRT prescription.  Discussed with ICU team. -Strict ins and out, monitor labs.  # Esophageal leak following resection of esophageal diverticulum: Underwent placement of a new esophageal stent previous one migrated distally into the stomach.  EGD and esophagram without evidence of leak.   # Hypertensive urgency: Required Cleviprex  IV, monitor BP.  # Anemia: Getting blood transfusion.  Monitor hemoglobin.  # Acute hypoxic respiratory failure: Suspected to be multifactorial with pneumonia versus pneumonitis/acute lung injury and possibly superimposed pulmonary edema.  On broad-spectrum antibiotics with cefepime /fluconazole /linezolid  as well as ongoing diuretic.  Intubated on 1/27 and had bronchoscopy.  Discussed with ICU team and surgeon.  Subjective: Seen and examined in ICU.  Urine output is around 761 cc.  Currently intubated, sedated.  Tolerating CRRT well with around 50 cc an hour net UF, no problem with filter.  Objective Vital signs in last 24 hours: Vitals:   08/27/24 0800 08/27/24 0805 08/27/24 0810 08/27/24 0815  BP:      Pulse: 89 94 94 95  Resp: (!) 24 (!) 24 (!) 25 (!) 28  Temp: 98.4 F (36.9 C) 98.4 F (36.9 C) 98.4 F (36.9 C) 98.4 F (36.9 C)  TempSrc:      SpO2: 97% 97% 97% 95%  Weight:      Height:   5' 2.99 (1.6 m)    Weight change: -0.1 kg  Intake/Output Summary (Last 24 hours) at 08/27/2024 1014 Last  data filed at 08/27/2024 0859 Gross per 24 hour  Intake 4055.52 ml  Output 6686.7 ml  Net -2631.18 ml       Labs: RENAL PANEL Recent Labs  Lab 08/23/24 0415 08/23/24 1808 08/24/24 0419 08/24/24 0505 08/25/24 0409 08/25/24 0735 08/26/24 0334 08/26/24 0735 08/26/24 1600 08/27/24 0513  NA 143   < > 141   < > 136 138 136 136 136 134*  134*  K 3.9   < > 5.0   < > 3.9 4.1 4.1 4.0 4.0 3.7  3.7  CL 106  --  106  --  100  --  100  --  100 100  100  CO2 23  --  21*  --  19*  --  18*  --  19* 22  22  GLUCOSE 128*  --  247*  --  278*  --  165*  --  119* 117*  119*  BUN 90*  --  94*  --  110*  --  144*  --  117* 83*  84*  CREATININE 3.78*  --  3.79*  --  4.23*  --  5.06*  --  3.91* 2.47*  2.45*  CALCIUM 8.9  --  8.9  --  8.5*  --  8.6*  --  8.0* 7.9*  8.0*  MG 2.2  --  2.4  --  2.1  --  2.2  --   --  2.2  PHOS 3.5  --  5.2*  --  3.7  --  5.0*  --  4.3 2.9  2.9  ALBUMIN  2.6*  --   --   --  2.3*  --  2.4*  --  2.5* 2.6*  2.6*   < > = values in this interval not displayed.    Liver Function Tests: Recent Labs  Lab 08/25/24 0409 08/26/24 0334 08/26/24 1600 08/27/24 0513  AST 24 22  --  23  ALT 21 18  --  19  ALKPHOS 97 98  --  103  BILITOT 0.2 0.2  --  0.2  PROT 5.3* 5.1*  --  5.5*  ALBUMIN  2.3* 2.4* 2.5* 2.6*  2.6*   No results for input(s): LIPASE, AMYLASE in the last 168 hours. No results for input(s): AMMONIA in the last 168 hours. CBC: Recent Labs    08/25/24 1205 08/26/24 0334 08/26/24 0735 08/26/24 1241 08/27/24 0513  HGB 7.6* 7.0* 6.8* 8.4* 9.2*  MCV 89.1 90.5  --  91.5 91.3    Cardiac Enzymes: No results for input(s): CKTOTAL, CKMB, CKMBINDEX, TROPONINI in the last 168 hours. CBG: Recent Labs  Lab 08/26/24 1607 08/26/24 2025 08/27/24 0020 08/27/24 0338 08/27/24 0744  GLUCAP 109* 110* 111* 132* 100*    Iron Studies: No results for input(s): IRON, TIBC, TRANSFERRIN, FERRITIN in the last 72 hours. Studies/Results: DG  Abd Portable 1V Result Date: 08/27/2024 EXAM: 1 VIEW XRAY OF THE ABDOMEN 08/27/2024 08:08:00 AM COMPARISON: 08/26/2024 CLINICAL HISTORY: Nasogastric tube present. FINDINGS: LINES, TUBES AND DEVICES: Partially imaged esophageal stent in place. Stable weighted enteric tube with tip near the gastric antrum. BOWEL: Nonobstructive bowel gas pattern. SOFT TISSUES: No abnormal calcifications. BONES: No acute fracture. LUNGS: Stable bilateral airspace opacities in the visualized lungs. IMPRESSION: 1. Stable weighted enteric tube with tip near the gastric antrum. Electronically signed by: Waddell Calk MD 08/27/2024 08:42 AM EST RP Workstation: [id number]   DG CHEST PORT 1 VIEW Result Date: 08/27/2024 EXAM: 1 VIEW XRAY OF THE CHEST 08/27/2024 05:37:00 AM COMPARISON: 08/26/2024 CLINICAL HISTORY: Pneumonia. FINDINGS: LINES, TUBES AND DEVICES: Stable right IJ CVC, endotracheal tube, enteric tube and esophageal stent. LUNGS AND PLEURA: Bilateral airspace opacities, left greater than right, slightly improved. Small bilateral pleural effusions. No pneumothorax. HEART AND MEDIASTINUM: Aortic atherosclerosis. No acute abnormality of the cardiac and mediastinal silhouettes. BONES AND SOFT TISSUES: No acute osseous abnormality. IMPRESSION: 1. Bilateral airspace opacities, left greater than right, slightly improved. 2. Small bilateral pleural effusions. Electronically signed by: Waddell Calk MD 08/27/2024 07:10 AM EST RP Workstation: [id number]   DG Abd Portable 1V Result Date: 08/26/2024 CLINICAL DATA:  Status post feeding tube placement. EXAM: PORTABLE ABDOMEN - 1 VIEW COMPARISON:  August 25, 2024 FINDINGS: There is stable feeding tube positioning with its radiopaque distal tip seen within the expected region of the gastric antrum. The bowel gas pattern is normal. No radio-opaque calculi or other significant radiographic abnormality are seen. Marked severity bilateral airspace disease is noted. IMPRESSION: 1. Stable feeding  tube positioning. 2. Marked severity bilateral airspace disease. Electronically Signed   By: Suzen Dials M.D.   On: 08/26/2024 14:44   DG Chest Port 1 View Result Date: 08/26/2024 CLINICAL DATA:  Status post lobectomy status post feeding tube placement. EXAM: PORTABLE CHEST 1 VIEW COMPARISON:  August 25, 2024 FINDINGS: There is stable endotracheal tube, nasogastric tube and right-sided PICC line positioning. An esophageal stent is in place. The heart size and mediastinal contours are within normal limits. There is stable marked severity bilateral airspace disease, left greater than right. A very  small right pleural effusion is suspected. No pneumothorax is identified. The visualized skeletal structures are unremarkable. IMPRESSION: 1. Stable support apparatus. 2. Stable marked severity bilateral airspace disease, left greater than right. 3. Very small right pleural effusion. Electronically Signed   By: Suzen Dials M.D.   On: 08/26/2024 14:41   DG Chest Port 1 View Result Date: 08/26/2024 EXAM: 1 VIEW(S) XRAY OF THE CHEST 08/26/2024 10:50:00 AM COMPARISON: 08/25/2024 CLINICAL HISTORY: Encounter for central line placement. FINDINGS: LINES, TUBES AND DEVICES: Endotracheal and enteric tubes stable in position. Right upper extremity PICC stable in position. New right IJ central venous catheter with tip at mid SVC. Esophageal stent stable in position. LUNGS AND PLEURA: Small bilateral pleural effusions. Persistent diffuse bilateral airspace opacities, unchanged from prior exam. No pneumothorax. HEART AND MEDIASTINUM: Aortic atherosclerotic calcifications. No acute abnormality of the cardiac and mediastinal silhouettes. BONES AND SOFT TISSUES: No acute osseous abnormality. IMPRESSION: 1. New right internal jugular central venous catheter with tip projecting at the mid superior vena cava. No pneumothorax identified. 2. Persistent diffuse bilateral airspace opacities, unchanged from prior exam. 3. Small  bilateral pleural effusions. Electronically signed by: Waddell Calk MD 08/26/2024 11:25 AM EST RP Workstation: [id number]   DG Abd Portable 1V Result Date: 08/25/2024 EXAM: 1 VIEW XRAY OF THE ABDOMEN 08/25/2024 04:32:00 PM COMPARISON: Film from 4:05 pm. CLINICAL HISTORY: Encounter for feeding tube placement. ICD10: 738535 Encounter for feeding tube placement; [id number] On enteral nutrition. FINDINGS: LINES, TUBES AND DEVICES: Feeding tube in place with tip in the distal gastric body. Contrast is injected, demonstrating the catheter position. BOWEL: Nonobstructive bowel gas pattern. SOFT TISSUES: No abnormal calcifications. BONES: No acute fracture. IMPRESSION: 1. Feeding tube in place with tip in the distal gastric body, confirmed by contrast injection. Electronically signed by: Oneil Devonshire MD 08/25/2024 05:25 PM EST RP Workstation: [email or web address] Abd Portable 1V Result Date: 08/25/2024 EXAM: 1 VIEW XRAY OF THE ABDOMEN 08/25/2024 04:32:00 PM COMPARISON: Film from 4:02 pm. CLINICAL HISTORY: Encounter for feeding tube placement. ICD10: 738535 Encounter for feeding tube placement; [id number] On enteral nutrition. FINDINGS: LINES, TUBES AND DEVICES: Feeding tube in place with tip over the distal stomach, relatively stable from the prior exam. BOWEL: Nonobstructive bowel gas pattern. SOFT TISSUES: No abnormal calcifications. BONES: No acute fracture. IMPRESSION: 1. Feeding tube in place with tip projecting over the distal stomach. Electronically signed by: Oneil Devonshire MD 08/25/2024 05:24 PM EST RP Workstation: [email or web address] Abd Portable 1V Result Date: 08/25/2024 EXAM: 1 VIEW XRAY OF THE ABDOMEN 08/25/2024 04:32:00 PM COMPARISON: Film from 4:01 pm. CLINICAL HISTORY: Encounter for feeding tube placement. On enteral nutrition. ICD10: [id number] Encounter for feeding tube placement; [id number] On enteral nutrition. FINDINGS: LINES, TUBES AND DEVICES: Weighted tip enteric tube in the mid to distal stomach, slightly withdrawn  when compared with the prior exam. BOWEL: Nonobstructive bowel gas pattern. SOFT TISSUES: No abnormal calcifications. BONES: No acute fracture. IMPRESSION: 1. Weighted tip enteric tube in the mid to distal stomach, slightly withdrawn compared with the prior exam. Electronically signed by: Oneil Devonshire MD 08/25/2024 05:23 PM EST RP Workstation: [email or web address]   DG Abd Portable 1V Result Date: 08/25/2024 EXAM: 1 VIEW XRAY OF THE ABDOMEN 08/25/2024 04:32:00 PM COMPARISON: Film from 4:01 pm. CLINICAL HISTORY: Encounter for feeding tube placement. ICD10: [id number] Encounter for feeding tube placement; [id number] On enteral nutrition. FINDINGS: LINES, TUBES AND DEVICES: Enteric tube in place with distal tip terminating in the distal stomach. BOWEL: Nonobstructive bowel gas pattern. SOFT TISSUES:  No abnormal calcifications. BONES: No acute fracture. IMPRESSION: 1. Enteric tube in place with distal tip terminating in the distal stomach. Electronically signed by: Oneil Devonshire MD 08/25/2024 05:23 PM EST RP Workstation: [email or web address] Abd Portable 1V Result Date: 08/25/2024 EXAM: 1 VIEW XRAY OF THE ABDOMEN 08/25/2024 04:32:00 PM COMPARISON: Film from 4:00 pm. CLINICAL HISTORY: Encounter for feeding tube placement. ICD10: [id number] Encounter for feeding tube placement; [id number] On enteral nutrition. FINDINGS: LINES, TUBES AND DEVICES: Weighted enteric tube in place with tip in the distal stomach. LUNG BASES: Partially imaged lung bases with interstitial and airspace opacities. BOWEL: Nonobstructive bowel gas pattern. SOFT TISSUES: No abnormal calcifications. BONES: No acute fracture. IMPRESSION: 1. Weighted enteric tube in place with tip in the distal stomach. 2. Partially imaged lung bases with interstitial and airspace opacities. Electronically signed by: Oneil Devonshire MD 08/25/2024 05:22 PM EST RP Workstation: [email or web address] Abd Portable 1V Result Date: 08/25/2024 EXAM: 1 VIEW XRAY OF THE ABDOMEN 08/25/2024 04:32:00 PM COMPARISON:  Film from 04:00 pm. CLINICAL HISTORY: Encounter for feeding tube placement. On enteral nutrition. ICD10: [id number] Encounter for feeding tube placement; [id number] On enteral nutrition. FINDINGS: LINES, TUBES AND DEVICES: Weighted enteric tube in stable position. Partially visualized esophageal stent noted. BOWEL: Nonobstructive bowel gas pattern. SOFT TISSUES: No abnormal calcifications. BONES: No acute fracture. LUNG BASES: Patchy opacities in lung bases. IMPRESSION: 1. Weighted enteric tube in stable position. Electronically signed by: Oneil Devonshire MD 08/25/2024 05:21 PM EST RP Workstation: [email or web address] Abd Portable 1V Result Date: 08/25/2024 EXAM: 1 VIEW XRAY OF THE ABDOMEN 08/25/2024 04:32:00 PM COMPARISON: None available. CLINICAL HISTORY: Encounter for feeding tube placement. ICD10: [id number] Encounter for feeding tube placement; [id number] On enteral nutrition. FINDINGS: LINES, TUBES AND DEVICES: Feeding tube in place with tip in stable position, likely within the pylorus. Partially visualized midline stent noted. BOWEL: Nonobstructive bowel gas pattern. SOFT TISSUES: No abnormal calcifications. BONES: No acute fracture. LUNG BASES: Patchy opacities at lung bases. IMPRESSION: 1. Feeding tube in place with tip in stable position, likely within the pylorus. 2. Patchy opacities at the lung bases. Electronically signed by: Oneil Devonshire MD 08/25/2024 05:20 PM EST RP Workstation: [email or web address] Abd Portable 1V Result Date: 08/25/2024 EXAM: 1 VIEW XRAY OF THE ABDOMEN 08/25/2024 04:32:00 PM COMPARISON: None available. CLINICAL HISTORY: Encounter for feeding tube placement; On enteral nutrition. FINDINGS: LINES, TUBES AND DEVICES: Enteric tube in place with distal tip likely within the pyloric channel. Midline stent noted. Right chest tube in place. BOWEL: Nonobstructive bowel gas pattern. SOFT TISSUES: No abnormal calcifications. BONES: No acute fracture. LUNGS: Bibasilar airspace disease. IMPRESSION: 1. Enteric tube in  place with distal tip likely within the pyloric channel. 2. Bibasilar airspace disease. Electronically signed by: Oneil Devonshire MD 08/25/2024 05:20 PM EST RP Workstation: [email or web address] Abd Portable 1V Result Date: 08/25/2024 EXAM: 1 VIEW XRAY OF THE ABDOMEN 08/25/2024 04:32:00 PM COMPARISON: Film from 3:57 pm. CLINICAL HISTORY: Encounter for feeding tube placement. ICD10: [id number] Encounter for feeding tube placement; [id number] On enteral nutrition. FINDINGS: LINES, TUBES AND DEVICES: Feeding tube in place with tip in expected position of distal stomach directed towards the pylorus. Partially seen midline esophageal stent. LUNG BASES: Coarse lung markings at the lung bases. BOWEL: Nonobstructive bowel gas pattern. SOFT TISSUES: No abnormal calcifications. BONES: No acute fracture. IMPRESSION: 1. Feeding tube in place with tip in expected position of distal stomach directed towards the pylorus. 2. Partially seen midline esophageal stent. Electronically signed  by: Oneil Devonshire MD 08/25/2024 05:19 PM EST RP Workstation: [email or web address] Abd Portable 1V Result Date: 08/25/2024 EXAM: 1 VIEW XRAY OF THE ABDOMEN 08/25/2024 04:32:00 PM COMPARISON: Film from 3:57 pm. CLINICAL HISTORY: Encounter for feeding tube placement. ICD10: [id number] Encounter for feeding tube placement; [id number] On enteral nutrition. FINDINGS: LINES, TUBES AND DEVICES: Feeding tube in place with tip in gastric antrum. Esophageal stent noted overlying thoracic spine. LUNGS: Interstitial and airspace opacities in lung bases. BOWEL: Nonobstructive bowel gas pattern. SOFT TISSUES: No abnormal calcifications. BONES: No acute fracture. IMPRESSION: 1. Feeding tube in place with tip in gastric antrum. 2. Esophageal stent noted overlying thoracic spine. 3. Interstitial and airspace opacities in lung bases. Electronically signed by: Oneil Devonshire MD 08/25/2024 05:17 PM EST RP Workstation: [email or web address] Abd Portable 1V Result Date: 08/25/2024 EXAM: 1 VIEW XRAY OF  THE ABDOMEN 08/25/2024 04:32:00 PM COMPARISON: Film from 3:56 pm. CLINICAL HISTORY: Encounter for feeding tube placement. ICD10: [id number] Encounter for feeding tube placement; [id number] On enteral nutrition. FINDINGS: LINES, TUBES AND DEVICES: Feeding tube in place with tip overlying distal stomach in stable position. LUNG BASES: Hazy lung base opacities. BOWEL: Nonobstructive bowel gas pattern. SOFT TISSUES: No abnormal calcifications. BONES: No acute fracture. IMPRESSION: 1. Feeding tube tip overlies the distal stomach in stable position. Electronically signed by: Oneil Devonshire MD 08/25/2024 05:16 PM EST RP Workstation: [email or web address] Abd Portable 1V Result Date: 08/25/2024 EXAM: 1 VIEW XRAY OF THE ABDOMEN 08/25/2024 04:32:00 PM COMPARISON: Film from earlier in the same day at 3:55 pm. CLINICAL HISTORY: Encounter for feeding tube placement. ICD10: [id number] Encounter for feeding tube placement; [id number] On enteral nutrition. FINDINGS: LINES, TUBES AND DEVICES: Weighted enteric tube in place with tip below the diaphragm in the distal stomach directed towards the pylorus. Partially visualized esophageal stent in place in the mid to distal esophagus. BOWEL: Nonobstructive bowel gas pattern. SOFT TISSUES: No abnormal calcifications. BONES: No acute fracture. IMPRESSION: 1. Weighted enteric tube in place with tip below the diaphragm in the distal stomach directed towards the pylorus. Electronically signed by: Oneil Devonshire MD 08/25/2024 05:16 PM EST RP Workstation: [email or web address] Abd Portable 1V Result Date: 08/25/2024 EXAM: 1 VIEW XRAY OF THE ABDOMEN 08/25/2024 04:32:00 PM COMPARISON: Chest x-ray from 08/25/2024 03:53:00 PM. CLINICAL HISTORY: Encounter for feeding tube placement. ICD10: [id number] Encounter for feeding tube placement; [id number] On enteral nutrition. FINDINGS: LINES, TUBES AND DEVICES: Weighted enteric tube in place with tip terminating over the distal stomach. Partially imaged midline esophageal stent noted.  LUNGS: Partially imaged bilateral lung base opacities. BOWEL: Nonobstructive bowel gas pattern. SOFT TISSUES: No abnormal calcifications. BONES: No acute fracture. IMPRESSION: 1. Weighted enteric tube in place with tip terminating over the distal stomach. 2. Partially imaged midline esophageal stent. Electronically signed by: Oneil Devonshire MD 08/25/2024 05:14 PM EST RP Workstation: [email or web address]   DG Chest Port 1 View Result Date: 08/25/2024 EXAM: 1 VIEW(S) XRAY OF THE CHEST 08/25/2024 04:32:00 PM COMPARISON: 08/25/2024 03:53 PM CLINICAL HISTORY: On enteral nutrition. FINDINGS: LINES, TUBES AND DEVICES: Right arm PICC with tip in expected region of superior vena cava junction. Endotracheal tube with tip 3.4 cm above carina. Enteric tube coursing below hemidiaphragm with tip and side port in place. Esophageal stent in place. LUNGS AND PLEURA: Similar-appearing diffuse bilateral airspace opacities, left greater than right. Probable trace bilateral pleural effusions. No pneumothorax. HEART AND MEDIASTINUM: Atherosclerotic plaque noted. No acute abnormality of the cardiac and mediastinal silhouettes. BONES AND SOFT TISSUES:  No acute osseous abnormality. IMPRESSION: 1. Enteric tube courses below the diaphragm with tip in place. 2. Endotracheal tube tip 3.4 cm above the carina. 3. Right arm PICC tip in the expected region of the superior cavoatrial junction. 4. Similar diffuse bilateral airspace opacities, left greater than right. 5. Probable trace bilateral pleural effusions. Electronically signed by: Oneil Devonshire MD 08/25/2024 05:13 PM EST RP Workstation: [email or web address]   DG Chest Port 1 View Result Date: 08/25/2024 EXAM: 1 VIEW(S) XRAY OF THE CHEST 08/25/2024 04:32:00 PM COMPARISON: 08/25/2024 03:52:00 PM CLINICAL HISTORY: On enteral nutrition. FINDINGS: LINES, TUBES AND DEVICES: Endotracheal tube in place with tip 4.1 cm above the carina. Esophageal stent in place in mid thoracic esophagus. Enteric tube in place with tip  in upper thoracic esophagus. Stable right arm PICC. LUNGS AND PLEURA: Persistent bilateral airspace opacities, left greater than right. Small bilateral pleural effusions. No pneumothorax. HEART AND MEDIASTINUM: Aortic atherosclerosis. No acute abnormality of the cardiac and mediastinal silhouettes. BONES AND SOFT TISSUES: No acute osseous abnormality. IMPRESSION: 1. Enteric tube tip in the upper thoracic esophagus. Recommend advancement into the stomach before use. 2. Endotracheal tube tip 4.1 cm above the carina. Electronically signed by: Oneil Devonshire MD 08/25/2024 05:12 PM EST RP Workstation: [email or web address]   DG Chest Port 1 View Result Date: 08/25/2024 EXAM: 1 VIEW(S) XRAY OF THE CHEST 08/25/2024 04:32:00 PM COMPARISON: 08/25/2024 CLINICAL HISTORY: On enteral nutrition. FINDINGS: LINES, TUBES AND DEVICES: Endotracheal tube in place with tip terminating at midthoracic trachea. Right upper extremity PICC in place with tip at superior cavoatrial junction. Esophageal stent noted. Enteric tube with tip in proximal esophagus. LUNGS AND PLEURA: Persistent bilateral airspace opacities, left greater than right. Small bilateral pleural effusions. No pneumothorax. HEART AND MEDIASTINUM: Aortic atherosclerosis. No acute abnormality of the cardiac and mediastinal silhouettes. BONES AND SOFT TISSUES: No acute osseous abnormality. IMPRESSION: 1. Enteric tube tip terminates in the proximal esophagus; recommend advancement to appropriate position prior to use. 2. Persistent bilateral airspace opacities, left greater than right. 3. Small bilateral pleural effusions. Electronically signed by: Oneil Devonshire MD 08/25/2024 05:10 PM EST RP Workstation: [id number]   DG CHEST PORT 1 VIEW Result Date: 08/25/2024 EXAM: 1 VIEW(S) XRAY OF THE CHEST 08/25/2024 04:32:48 PM COMPARISON: 08/25/2024 CLINICAL HISTORY: On enteral nutrition. FINDINGS: LINES, TUBES AND DEVICES: Esophageal Stent over midline. Right upper PICC with tip at superior  junction. Endotracheal tube in place with tip 5 cm above carina. LUNGS AND PLEURA: Small bilateral pleural effusions, probable. Diffuse bilateral airspace opacities, left greater than right, increased compared to prior. No pneumothorax. HEART AND MEDIASTINUM: Atherosclerotic calcifications. No acute abnormality of the cardiac and mediastinal silhouettes. BONES AND SOFT TISSUES: No acute osseous abnormality. IMPRESSION: 1. Diffuse bilateral airspace opacities, left greater than right, increased compared to prior. 2. Small bilateral pleural effusions, probable. Electronically signed by: Elsie Gravely MD 08/25/2024 05:10 PM EST RP Workstation: [id number]    Medications: Infusions:  acetaminophen      ceFEPime  (MAXIPIME ) IV Stopped (08/27/24 0308)   clevidipine  13.5 mg/hr (08/27/24 0929)   famotidine  (PEPCID ) IV Stopped (08/26/24 1148)   fentaNYL  infusion INTRAVENOUS 300 mcg/hr (08/27/24 0859)   linezolid  (ZYVOX ) IV Stopped (08/27/24 0232)   metronidazole  Stopped (08/27/24 9141)   micafungin  (MYCAMINE ) 100 mg in sodium chloride  0.9 % 100 mL IVPB Stopped (08/26/24 1220)   midazolam  Stopped (08/26/24 1317)   prismasol  BGK 4/2.5 400 mL/hr at 08/27/24 0011   prismasol  BGK 4/2.5 400 mL/hr at 08/27/24 0032   prismasol  BGK 4/2.5 1,500 mL/hr at  08/27/24 0805   promethazine  (PHENERGAN ) injection (IM or IVPB) Stopped (08/19/24 1554)   propofol  (DIPRIVAN ) infusion 40 mcg/kg/min (08/27/24 0959)   TPN ADULT (ION) 40 mL/hr at 08/27/24 0859   TPN ADULT (ION)      Scheduled Medications:  sodium chloride    Intravenous Once   Chlorhexidine  Gluconate Cloth  6 each Topical Daily   cloNIDine   0.2 mg Transdermal Weekly   fentaNYL   1 patch Transdermal Q72H   hydrALAZINE   10 mg Intravenous Q6H   insulin  aspart  0-20 Units Subcutaneous Q4H   insulin  glargine  26 Units Subcutaneous Daily   ipratropium-albuterol   3 mL Nebulization TID   lidocaine   2 patch Transdermal Q24H   neomycin -bacitracin -polymyxin   Topical  BID   nitroGLYCERIN   0.3 mg Transdermal Daily   mouth rinse  15 mL Mouth Rinse Q2H   sodium chloride  flush  10-40 mL Intracatheter Q12H    have reviewed scheduled and prn medications.  Physical Exam: General: Intubated, sedated. Heart:RRR, s1s2 nl Lungs: Diffuse Rales and crackles. Abdomen:soft, Non-tender, non-distended Extremities:No edema Neurology: Sedated. Dialysis access: Right IJ temporary HD catheter placed by PCCM on 1/29.  Arlow Spiers Prasad Lelend Heinecke 08/27/2024,10:14 AM  LOS: 32 days

## 2024-08-27 NOTE — Progress Notes (Signed)
" ° °  [address], [place] [address]             [phone]   Intubated, sedated  BP (!) 156/56   Pulse 90   Temp 98.1 F (36.7 C)   Resp (!) 21   Ht 5' 2.99 (1.6 m)   Wt 85.3 kg   LMP 10/26/1998 Comment: ~IN 40s  SpO2 100%   BMI 33.32 kg/m   CVP 4  Intake/Output Summary (Last 24 hours) at 08/27/2024 1713 Last data filed at 08/27/2024 1700 Gross per 24 hour  Intake 4004 ml  Output 7230.4 ml  Net -3226.4 ml   Continue current care  Gwendola Hornaday C. Kerrin, MD [facility] [phone]  "

## 2024-08-27 NOTE — Progress Notes (Addendum)
 PHARMACY - TOTAL PARENTERAL NUTRITION CONSULT NOTE  Indication: Esophageal leak  Patient Measurements: Height: 5' 3 (160 cm) Weight: 85.3 kg (188 lb 0.8 oz) IBW/kg (Calculated) : 52.4 TPN AdjBW (KG): 55.8 Body mass index is 33.31 kg/m.  Assessment:  85 YOF with NSCLC here for RML lobectomy, resection of esophageal diverticulum, and lymph node dissection on 12/29.  Found to have a small contained leak post-op.   Patient reports an intentional weight loss of 40 lbs over a couple of years when she was diagnosed with DM.  She typically drinks one Orgain shake every morning for breakfast and eats one meal a day, which could be pizza, spaghetti, chicken, beef or fish along with vegetables and fruits.  Patient was eating normally until her planned surgery. RD concerned with patient being malnourished. Patient was on TPN 12/31 - 1/12 and was advanced to dysphagia 1 diet. However, she was NPO again 1/14-1/16. On 1/17 IR reported small amount of leak from distal esophageal stent. Patient developed AKI since TPN was held. Pharmacy reconsulted for TPN 1/17.  Patient intubated overnight on 1/27. Currently on propofol  and cleviprex . Trial of nicardipine  on 1/26 without adequate BP control. Requiring propofol  for sedation, potential extubation on 1/30 so team would like to avoid versed . Precedex  likely with inadequate effect for full wean. Potential for Jtube / PEG at the end of the week per TCTS.   1/30: CRRT started yesterday 1/29, glc control improving. TG noted.   Glucose / Insulin : hx DM, A1c 5.9% - BG 117-132, 6 units SSI/24h, glargine 26 units/24hr per TCTS, insulin  removed from TPN on 1/27 (previously 20u, was stopped when TPN was adjusted for cleviprex )   Electrolytes: Na 134, K 3.7, CL 100, CO2 22 (max acetate 1/27), Phos: 2.9 (removed from TPN 1/27), Mg: 2.2 (removed from TPN 1/27), CoCa: 9.1 Renal: AKI on CRRT- SCr 2.45 down (BL 0.8), BUN 83  Hepatic: 1/29- LFTs / tbili WNL, albumin  2.6, TG 375  1/30 (noted clevidipine  added on 1/26)  Intake / Output; MIVF: UOP 761 mL (0.4 ml/kg/hr), LBM 1/26, chest tube 130 mL , last lasix  1/24, reglan  10 Q6H  Net IO Since Admission: 25,461.93 mL [08/27/24 0722] CRRT removed 5.4 L   GI Imaging:   12/30 esophagram: Positive for contrast extravasation and an esophageal leak in the mid esophagus 1/2 esophagram: Persistent contrast extravasation and esophageal leak, similar to prior 1/6 esophagram: Limited contrast study due to patient aspiration and severe coughing spell. Study aborted. 1/12 esophagram: Patent esophageal stent. No evidence for extravasation or leak, Delayed esophageal emptying and gastroesophageal reflux leading to stasis of contrast in the esophagus. 1/15 CT: displaced esophageal stent in stomach, no bowel obstruction  1/17 esophagram: small volume esophageal leak at distal stent 1/20 esophagram: no evidence of any leak  GI Surgeries / Procedures:  12/30 right middle lobectomy, resection esophageal diverticulum, lymph node biopsy  1/7 EGD- stent placement  1/9 EGD stent repositioned d/t migration to stomach  1/16 EGD- stent placed at esophageal ulcer, removed gastric stent, normal duoenum  1/19 EGD - stent replaced / repositioned   Central access: PICC 07/28/24 TPN start date: 07/28/24 > 1/12; 1/17 >>   Nutritional Goals:  Goal unconcentrated TPN rate 75 ml/hr to provide 101g AA and 1721 kCal Goal concentrated TPN rate 60 mL/hr to provide 101 g AA and 1743 kCal  RD Estimated Needs Total Energy Estimated Needs: 1700-1900 Total Protein Estimated Needs: 100-115g Total Fluid Estimated Needs: >1.7L/day  Current Nutrition:  TPN 1/17 >>  1/08 CLD - passed swallow 1/09 CLD - patient feels can't swallow well > NPO 1/13 advanced to DYS1 1/14 NPO  1/21 FLD, Glucerna x1 on 1/21, milkshake per patient  1/24 NPO    Plan: Continue adjusted TPN to meet kcal needs while taking into account cleviprex  / propofol .   Cleviprex   infusing at 5-7.5 mg/hr (10-48mL/hr) for the past several hours. Planning to attempt to wean using hydralazine  and clonidine . Total cleviprex  volume in 24 hours = ~270 mL, = ~ 540 kcal from cleviprex .   Propofol  - unable to do any other sedatives. Infusing at 40 mcg/kg/min = ~ 20.5 mL/hr. Total volume in 24 hours ~380 mL = ~418 kcal. Total kcal from cleviprex  and propofol  = ~ 958 kcal  Concentrated TPN at 45 mL/hr @ 18:00. Will provide 110g of protein (increased with CRRT) and 954 kcal (151 g CHO). With cleviprex  and propofol  will be meeting approximately 100% of nutritional needs (~1912 kcal). No lipids in TPN.   Electrolytes in TPN: Na 50 mEq/L, K 0 mEq/L, Ca 0 mEq/L, Mg 0 mEq/L, Phos 15 mmol/L. Cl:Ac max chloride (1:2.36 ratio).  Started CRRT on 1/29, TPN adjusted accordingly as above  Add standard MVI and trace elements to TPN Continue resistant SSI to q4h and adjust as needed Insulin  glargine decreased to 26 units QD per TCTS on 1/29. May need further reduction. Monitor TPN labs on Mon/Thurs, and daily given AKI. CRRT labs are ordered.   Thank you for allowing pharmacy to be a part of this patients care.  Rankin Sams, PharmD, BCCCP Clinical Pharmacist

## 2024-08-27 NOTE — Progress Notes (Addendum)
 TCTS DAILY ICU PROGRESS NOTE                   [address]            [place] [address]          [phone]   11 Days Post-Op Procedures (LRB): EGD (ESOPHAGOGASTRODUODENOSCOPY) (N/A) CONTROL OF HEMORRHAGE, GI TRACT, ENDOSCOPIC, BY CLIPPING OR OVERSEWING 14 days s/p bronchoscopy         21 days s/p EGD, reposition esophageal stent 23 days s/p EGD, placement of esophageal stent 32 days s/p Xi RML, LN dissection, intercostal nerve block, resection of esophageal diverticulum  Total Length of Stay:  LOS: 32 days   Subjective: Patient intubated/sedated  Objective: Vital signs in last 24 hours: Temp:  [97.5 F (36.4 C)-98.6 F (37 C)] 98.2 F (36.8 C) (01/30 0700) Pulse Rate:  [84-105] 93 (01/30 0700) Cardiac Rhythm: Normal sinus rhythm (01/30 0400) Resp:  [0-29] 26 (01/30 0700) BP: (120-144)/(46-66) 143/59 (01/30 0700) SpO2:  [92 %-98 %] 92 % (01/30 0700) FiO2 (%):  [40 %-45 %] 40 % (01/30 0400) Weight:  [85.3 kg] 85.3 kg (01/30 0500)  Filed Weights   08/25/24 0500 08/26/24 0500 08/27/24 0500  Weight: 84.2 kg 85.4 kg 85.3 kg    Weight change: -0.1 kg   Hemodynamic parameters for last 24 hours: CVP:  [3 mmHg-16 mmHg] 6 mmHg  Intake/Output from previous day: 01/29 0701 - 01/30 0700 In: 4059.9 [I.V.:2214.9; Blood:315; IV Piggyback:1530] Out: 6290.7 [Urine:761; Chest Tube:130]  Intake/Output this shift: No intake/output data recorded.  Current Meds: Scheduled Meds:  sodium chloride    Intravenous Once   Chlorhexidine  Gluconate Cloth  6 each Topical Daily   cloNIDine   0.2 mg Transdermal Weekly   fentaNYL   1 patch Transdermal Q72H   hydrALAZINE   10 mg Intravenous Q6H   insulin  aspart  0-20 Units Subcutaneous Q4H   insulin  glargine  26 Units Subcutaneous Daily   ipratropium-albuterol   3 mL Nebulization TID   lidocaine   2 patch Transdermal Q24H   neomycin -bacitracin -polymyxin   Topical BID   nitroGLYCERIN   0.3 mg Transdermal Daily   mouth rinse   15 mL Mouth Rinse Q2H   sodium chloride  flush  10-40 mL Intracatheter Q12H   Continuous Infusions:  acetaminophen      ceFEPime  (MAXIPIME ) IV Stopped (08/27/24 0308)   clevidipine  7.5 mg/hr (08/27/24 0700)   famotidine  (PEPCID ) IV Stopped (08/26/24 1148)   fentaNYL  infusion INTRAVENOUS 300 mcg/hr (08/27/24 0700)   linezolid  (ZYVOX ) IV Stopped (08/27/24 0232)   metronidazole  Stopped (08/26/24 2049)   micafungin  (MYCAMINE ) 100 mg in sodium chloride  0.9 % 100 mL IVPB Stopped (08/26/24 1220)   midazolam  Stopped (08/26/24 1317)   prismasol  BGK 4/2.5 400 mL/hr at 08/27/24 0011   prismasol  BGK 4/2.5 400 mL/hr at 08/27/24 0032   prismasol  BGK 4/2.5 1,500 mL/hr at 08/27/24 0446   promethazine  (PHENERGAN ) injection (IM or IVPB) Stopped (08/19/24 1554)   propofol  (DIPRIVAN ) infusion 40 mcg/kg/min (08/27/24 0700)   TPN ADULT (ION) 40 mL/hr at 08/27/24 0700   PRN Meds:.bisacodyl , diphenhydrAMINE , fentaNYL , heparin , hydrALAZINE , HYDROmorphone  (DILAUDID ) injection, levalbuterol , lip balm, LORazepam , metoprolol  tartrate, midazolam , ondansetron  (ZOFRAN ) IV, phenol, promethazine  (PHENERGAN ) injection (IM or IVPB), sodium chloride  flush   Heart: RRR Lungs: Diminished bibasilar breath sounds Abdomen: Soft, occasional bowel sounds present Extremities: Boots in place on both legs Wound: Anterior chest wound clean, dry, no sign of infection  Lab Results: CBC: Recent Labs    08/26/24 1241 08/27/24 0513  WBC 21.5* 18.2*  HGB 8.4* 9.2*  HCT 24.9* 27.2*  PLT 226 218   BMET:  Recent Labs    08/26/24 1600 08/27/24 0513  NA 136 134*  134*  K 4.0 3.7  3.7  CL 100 100  100  CO2 19* 22  22  GLUCOSE 119* 117*  119*  BUN 117* 83*  84*  CREATININE 3.91* 2.47*  2.45*  CALCIUM 8.0* 7.9*  8.0*    CMET: Lab Results  Component Value Date   WBC 18.2 (H) 08/27/2024   HGB 9.2 (L) 08/27/2024   HCT 27.2 (L) 08/27/2024   PLT 218 08/27/2024   GLUCOSE 119 (H) 08/27/2024   GLUCOSE 117 (H)  08/27/2024   TRIG 375 (H) 08/27/2024   ALT 19 08/27/2024   AST 23 08/27/2024   NA 134 (L) 08/27/2024   NA 134 (L) 08/27/2024   K 3.7 08/27/2024   K 3.7 08/27/2024   CL 100 08/27/2024   CL 100 08/27/2024   CREATININE 2.45 (H) 08/27/2024   CREATININE 2.47 (H) 08/27/2024   BUN 84 (H) 08/27/2024   BUN 83 (H) 08/27/2024   CO2 22 08/27/2024   CO2 22 08/27/2024   TSH 3.532 02/28/2022   INR 1.0 08/18/2024   HGBA1C 5.9 (H) 07/20/2024      PT/INR: No results for input(s): LABPROT, INR in the last 72 hours. Radiology: DG CHEST PORT 1 VIEW Result Date: 08/27/2024 EXAM: 1 VIEW XRAY OF THE CHEST 08/27/2024 05:37:00 AM COMPARISON: 08/26/2024 CLINICAL HISTORY: Pneumonia. FINDINGS: LINES, TUBES AND DEVICES: Stable right IJ CVC, endotracheal tube, enteric tube and esophageal stent. LUNGS AND PLEURA: Bilateral airspace opacities, left greater than right, slightly improved. Small bilateral pleural effusions. No pneumothorax. HEART AND MEDIASTINUM: Aortic atherosclerosis. No acute abnormality of the cardiac and mediastinal silhouettes. BONES AND SOFT TISSUES: No acute osseous abnormality. IMPRESSION: 1. Bilateral airspace opacities, left greater than right, slightly improved. 2. Small bilateral pleural effusions. Electronically signed by: Waddell Calk MD 08/27/2024 07:10 AM EST RP Workstation: [id number]   DG Chest Port 1 View Result Date: 08/26/2024 EXAM: 1 VIEW(S) XRAY OF THE CHEST 08/26/2024 10:50:00 AM COMPARISON: 08/25/2024 CLINICAL HISTORY: Encounter for central line placement. FINDINGS: LINES, TUBES AND DEVICES: Endotracheal and enteric tubes stable in position. Right upper extremity PICC stable in position. New right IJ central venous catheter with tip at mid SVC. Esophageal stent stable in position. LUNGS AND PLEURA: Small bilateral pleural effusions. Persistent diffuse bilateral airspace opacities, unchanged from prior exam. No pneumothorax. HEART AND MEDIASTINUM: Aortic atherosclerotic  calcifications. No acute abnormality of the cardiac and mediastinal silhouettes. BONES AND SOFT TISSUES: No acute osseous abnormality. IMPRESSION: 1. New right internal jugular central venous catheter with tip projecting at the mid superior vena cava. No pneumothorax identified. 2. Persistent diffuse bilateral airspace opacities, unchanged from prior exam. 3. Small bilateral pleural effusions. Electronically signed by: Waddell Calk MD 08/26/2024 11:25 AM EST RP Workstation: [id number]     Assessment/Plan: S/P Procedures (LRB): EGD (ESOPHAGOGASTRODUODENOSCOPY) (N/A) CONTROL OF HEMORRHAGE, GI TRACT, ENDOSCOPIC, BY CLIPPING OR OVERSEWING 1. CV - SR this am. BP well controlled. On Cleviprex  drip,  Clonidine  0.2 patch, Hydralazine  10 mg IV Q 6 ,  and Nitroglycerin  patch 0.3 mg. 2.  Pulmonary - ARDS, concern for aspiration. Awaiting final cultures-?COP. If COP, will need steroids. Re intubated 01/27.  S/p bronchoscopy 01/27-airway defect along medial wall of the BI from mainstem bronchus. Chest tube is to water seal with 130 cc output last 24 hours.  CXR this  am shows bilateral patchy airspace disease L>R, and small bilateral pleural effusions (slightly improved). Continue Xopenex , Duo nebs. Appreciate pulmonary/CCM's assistance 4. CBGs 110/111/132. On TPN so continue accu checks. 5. Anemia-H and H this am slightly stable at  9.2 and 27.2 6. ID-on Cefepime , Zyvox , Micafungin , Flagyl   for esophageal leak, aspiration PNA.  WBC this am decreased to 18,200. BAL culture no growth 2 days, blood culture negative to date (no fungus) 7. GI-s/p Cortrak. Esophageal stent in place. She remains NPO, TPN 8. AKI worsening-Suspected to have ATN secondary to ischemic and nephrotoxic etiologies. CRRT started yesterday. She is making some urine. Creatinine this am slightly increased to 2.47. Nephrology following   Kyla HERO ZimmermanPA-C 08/27/2024 7:20 AM  Patient seen and examined, rounds with CCM CXR better after  CRRT started still making some urine but not surprisingly decreased with CRRT  WBC down to 18K Remains on broad spectrum antibiotics- cultures unrevealing  Elspeth C. Kerrin, MD [facility] [phone]

## 2024-08-27 NOTE — Progress Notes (Signed)
 PT Cancellation Note  Patient Details Name: [name] MRN: [id number] DOB: 1956/05/17   Cancelled Treatment:    Reason Eval/Treat Not Completed: (P) Patient not medically ready. Pt remains intubated and is now on CRRT. RN reports pt is not appropriate today. PT will reassess tomorrow.   [name] B. Fleeta Lapidus PT, DPT Acute Rehabilitation Services Please use secure chat or  Call Office [phone]    [name] 08/27/2024, 7:59 AM

## 2024-08-27 NOTE — TOC Progression Note (Signed)
 Transition of Care [facility]) - Progression Note    Patient Details  Name: [name] MRN: [id number] Date of Birth: 1955/10/19  Transition [facility] CM/SW Contact  Arlana JINNY Nicholaus [place] Phone Number: [phone] 08/27/2024, 8:18 PM  Clinical Narrative:   Per chart review, patient is not medically stable for dc. ICM will continue to follow.   Unit CSW/CM will continue to follow and monitor for dc readiness.     Expected Discharge Plan: Home/Self Care Barriers to Discharge: Continued Medical Work up               Expected Discharge Plan and Services       Living arrangements for the past 2 months: Single Family Home                                       Social Drivers of Health (SDOH) Interventions SDOH Screenings   Food Insecurity: No Food Insecurity (07/26/2024)  Housing: Low Risk (07/26/2024)  Transportation Needs: No Transportation Needs (07/26/2024)  Utilities: Not At Risk (07/26/2024)  Depression (PHQ2-9): Low Risk (06/16/2024)  Social Connections: Unknown (07/26/2024)  Tobacco Use: Medium Risk (08/16/2024)    Readmission Risk Interventions     No data to display

## 2024-08-28 DIAGNOSIS — I160 Hypertensive urgency: Secondary | ICD-10-CM | POA: Diagnosis not present

## 2024-08-28 DIAGNOSIS — J80 Acute respiratory distress syndrome: Secondary | ICD-10-CM

## 2024-08-28 DIAGNOSIS — N179 Acute kidney failure, unspecified: Secondary | ICD-10-CM | POA: Diagnosis not present

## 2024-08-28 DIAGNOSIS — D649 Anemia, unspecified: Secondary | ICD-10-CM | POA: Diagnosis not present

## 2024-08-28 DIAGNOSIS — R739 Hyperglycemia, unspecified: Secondary | ICD-10-CM | POA: Diagnosis not present

## 2024-08-28 DIAGNOSIS — C342 Malignant neoplasm of middle lobe, bronchus or lung: Secondary | ICD-10-CM | POA: Diagnosis not present

## 2024-08-28 DIAGNOSIS — E43 Unspecified severe protein-calorie malnutrition: Secondary | ICD-10-CM | POA: Diagnosis not present

## 2024-08-28 LAB — COMPREHENSIVE METABOLIC PANEL WITH GFR
ALT: 20 U/L (ref 0–44)
AST: 25 U/L (ref 15–41)
Albumin: 2.5 g/dL — ABNORMAL LOW (ref 3.5–5.0)
Alkaline Phosphatase: 127 U/L — ABNORMAL HIGH (ref 38–126)
Anion gap: 12 (ref 5–15)
BUN: 54 mg/dL — ABNORMAL HIGH (ref 8–23)
CO2: 22 mmol/L (ref 22–32)
Calcium: 7.8 mg/dL — ABNORMAL LOW (ref 8.9–10.3)
Chloride: 99 mmol/L (ref 98–111)
Creatinine, Ser: 1.42 mg/dL — ABNORMAL HIGH (ref 0.44–1.00)
GFR, Estimated: 41 mL/min — ABNORMAL LOW
Glucose, Bld: 172 mg/dL — ABNORMAL HIGH (ref 70–99)
Potassium: 4.2 mmol/L (ref 3.5–5.1)
Sodium: 133 mmol/L — ABNORMAL LOW (ref 135–145)
Total Bilirubin: 0.3 mg/dL (ref 0.0–1.2)
Total Protein: 5.3 g/dL — ABNORMAL LOW (ref 6.5–8.1)

## 2024-08-28 LAB — RENAL FUNCTION PANEL
Albumin: 2.9 g/dL — ABNORMAL LOW (ref 3.5–5.0)
Anion gap: 12 (ref 5–15)
BUN: 48 mg/dL — ABNORMAL HIGH (ref 8–23)
CO2: 22 mmol/L (ref 22–32)
Calcium: 8.4 mg/dL — ABNORMAL LOW (ref 8.9–10.3)
Chloride: 98 mmol/L (ref 98–111)
Creatinine, Ser: 1.26 mg/dL — ABNORMAL HIGH (ref 0.44–1.00)
GFR, Estimated: 47 mL/min — ABNORMAL LOW
Glucose, Bld: 178 mg/dL — ABNORMAL HIGH (ref 70–99)
Phosphorus: 2.3 mg/dL — ABNORMAL LOW (ref 2.5–4.6)
Potassium: 3.7 mmol/L (ref 3.5–5.1)
Sodium: 133 mmol/L — ABNORMAL LOW (ref 135–145)

## 2024-08-28 LAB — PHOSPHORUS: Phosphorus: 3.1 mg/dL (ref 2.5–4.6)

## 2024-08-28 LAB — TRIGLYCERIDES: Triglycerides: 213 mg/dL — ABNORMAL HIGH

## 2024-08-28 LAB — CBC
HCT: 26 % — ABNORMAL LOW (ref 36.0–46.0)
Hemoglobin: 8.9 g/dL — ABNORMAL LOW (ref 12.0–15.0)
MCH: 30.7 pg (ref 26.0–34.0)
MCHC: 34.2 g/dL (ref 30.0–36.0)
MCV: 89.7 fL (ref 80.0–100.0)
Platelets: 197 10*3/uL (ref 150–400)
RBC: 2.9 MIL/uL — ABNORMAL LOW (ref 3.87–5.11)
RDW: 15.1 % (ref 11.5–15.5)
WBC: 18.8 10*3/uL — ABNORMAL HIGH (ref 4.0–10.5)
nRBC: 0.1 % (ref 0.0–0.2)

## 2024-08-28 LAB — FUNGUS CULTURE WITH STAIN

## 2024-08-28 LAB — FUNGUS CULTURE RESULT

## 2024-08-28 LAB — GLUCOSE, CAPILLARY
Glucose-Capillary: 167 mg/dL — ABNORMAL HIGH (ref 70–99)
Glucose-Capillary: 146 mg/dL — ABNORMAL HIGH (ref 70–99)
Glucose-Capillary: 177 mg/dL — ABNORMAL HIGH (ref 70–99)
Glucose-Capillary: 164 mg/dL — ABNORMAL HIGH (ref 70–99)
Glucose-Capillary: 174 mg/dL — ABNORMAL HIGH (ref 70–99)
Glucose-Capillary: 177 mg/dL — ABNORMAL HIGH (ref 70–99)

## 2024-08-28 LAB — MAGNESIUM: Magnesium: 2.3 mg/dL (ref 1.7–2.4)

## 2024-08-28 LAB — PNEUMOCYSTIS PCR: Result Pneumocystis PCR: NEGATIVE

## 2024-08-28 MED ORDER — INSULIN ASPART 100 UNIT/ML IJ SOLN
INTRAMUSCULAR | Status: AC
  Filled 2024-08-28: qty 4

## 2024-08-28 MED ORDER — CLONIDINE HCL 0.3 MG/24HR TD PTWK
0.3000 mg | MEDICATED_PATCH | TRANSDERMAL | Status: AC
  Administered 2024-08-31: 0.3 mg via TRANSDERMAL
  Filled 2024-08-28: qty 1

## 2024-08-28 MED ORDER — METOPROLOL TARTRATE 5 MG/5ML IV SOLN
10.0000 mg | Freq: Four times a day (QID) | INTRAVENOUS | Status: DC
  Administered 2024-08-28 – 2024-08-30 (×8): 10 mg via INTRAVENOUS
  Filled 2024-08-28 (×8): qty 10

## 2024-08-28 MED ORDER — HYDRALAZINE HCL 20 MG/ML IJ SOLN
20.0000 mg | INTRAMUSCULAR | Status: AC | PRN
  Administered 2024-08-29 – 2024-09-01 (×6): 20 mg via INTRAVENOUS
  Filled 2024-08-28 (×8): qty 1

## 2024-08-28 MED ORDER — POTASSIUM PHOSPHATES 15 MMOLE/5ML IV SOLN
15.0000 mmol | Freq: Once | INTRAVENOUS | Status: AC
  Administered 2024-08-28: 15 mmol via INTRAVENOUS
  Filled 2024-08-28: qty 5

## 2024-08-28 MED ORDER — ORAL CARE MOUTH RINSE
15.0000 mL | OROMUCOSAL | Status: AC | PRN
  Administered 2024-08-30 (×3): 15 mL via OROMUCOSAL

## 2024-08-28 MED ORDER — HYDRALAZINE HCL 20 MG/ML IJ SOLN
10.0000 mg | Freq: Once | INTRAMUSCULAR | Status: AC
  Administered 2024-08-28: 10 mg via INTRAVENOUS
  Filled 2024-08-28: qty 1

## 2024-08-28 MED ORDER — HYDRALAZINE HCL 20 MG/ML IJ SOLN
20.0000 mg | Freq: Four times a day (QID) | INTRAMUSCULAR | Status: DC
  Administered 2024-08-28 – 2024-09-01 (×15): 20 mg via INTRAVENOUS
  Filled 2024-08-28 (×14): qty 1

## 2024-08-28 MED ORDER — TRACE MINERALS CU-MN-SE-ZN 300-55-60-3000 MCG/ML IV SOLN
INTRAVENOUS | Status: AC
  Filled 2024-08-28: qty 734.4

## 2024-08-28 NOTE — Evaluation (Signed)
 Physical Therapy Re-Evaluation Patient Details Name: [name] MRN: [id number] DOB: 28-Aug-1955 Today's Date: 08/28/2024  History of Present Illness  The pt is a 69 yo female presenting 07/26/24 for Right middle lobectomy to remove non-small cell carcinoma and resection of esophogeal diverticulum. Returned to OR 08/04/24 for EGD with esophageal stent, s/p esophageal perforation. 08/06/24 return to OR for replacement of stent due to migration into stomach. 1/16 bronchoscopy, and bronchoalveolar lavage, replacement of new esophagus stent with clipping, 08/16/24 upper GI endoscopy. New AKI. Pt desaturating requiring NRB on 1/24. Intubated 1/26-1/31. EFY:unajrrn abuse, positive tuberculosis test as a young woman, hypertension, hyperlipidemia, heart murmur, DM II, and anxiety.   Clinical Impression  Pt seen after extubation earlier this morning, presents with significantly decreased weakness compared to last PT evaluation, as well as decreased activity tolerance, alertness, and command following. Pt also presents with increased edema in BUE, tolerated 1-2 reps AAROM but then PROM to UE without complaints of pain. Pt's daughter present and educated on PROM and encouraging pt to participate as she is able. Pt demos improved activation in LLE and LUE this session, but could have been limited by fatigue by the time she attempted to use her R extremities. Given significant weakness, instability, and limited activity tolerance, will benefit from continued acute skilled PT to progress activity tolerance and mobility prior to intensive therapies >3hours/day once medically stable for d/c.       If plan is discharge home, recommend the following: Two people to help with walking and/or transfers;Two people to help with bathing/dressing/bathroom;Assistance with cooking/housework;Assistance with feeding;Direct supervision/assist for medications management;Direct supervision/assist for financial management;Assist for  transportation;Help with stairs or ramp for entrance;Supervision due to cognitive status   Can travel by private vehicle        Equipment Recommendations Wheelchair (measurements PT);Wheelchair cushion (measurements PT) (defer until ambulation attempted)  Recommendations for Other Services  Rehab consult    Functional Status Assessment Patient has had a recent decline in their functional status and demonstrates the ability to make significant improvements in function in a reasonable and predictable amount of time.     Precautions / Restrictions Precautions Precautions: Other (comment) Recall of Precautions/Restrictions: Impaired Precaution/Restrictions Comments: chest tube, TPN, HHFNC (reduced to Rhea at end of session by RT), flexiseal, foley, CRRT Restrictions Weight Bearing Restrictions Per Provider Order: No      Mobility  Bed Mobility Overal bed mobility: Needs Assistance             General bed mobility comments: totalA with chair position in bed, dependent on posterior support for trunk. able to hold head up without support and reposition. BP elevated but stable.    Transfers                   General transfer comment: deferred due to lethargy and limited assistance       Balance Overall balance assessment: Needs assistance   Sitting balance-Leahy Scale: Zero Sitting balance - Comments: dependent on trunk support Postural control: Posterior lean                                   Pertinent Vitals/Pain Pain Assessment Pain Assessment: Faces Faces Pain Scale: Hurts even more Pain Location: pt initially stated her back, then grimacing with UE movement but did not answer regarding any other pain Pain Descriptors / Indicators: Discomfort, Grimacing, Moaning Pain Intervention(s): Limited activity within patient's tolerance, Monitored during  session, Repositioned    Home Living Family/patient expects to be discharged to:: Private  residence Living Arrangements: Spouse/significant other Available Help at Discharge: Family;Available 24 hours/day Type of Home: House Home Access: Stairs to enter Entrance Stairs-Rails: Doctor, General Practice of Steps: 4   Home Layout: One level        Prior Function Prior Level of Function : Independent/Modified Independent             Mobility Comments: community ambulator, driver ADLs Comments: independent with ADLs and iADLs, enjoys gardening     Extremity/Trunk Assessment   Upper Extremity Assessment Upper Extremity Assessment: Generalized weakness (significant swelling bilaterally in hands and arms, better grip in LUE than RUE to command but fatigued after ~3 reps of opening and closing hands)    Lower Extremity Assessment Lower Extremity Assessment: LLE deficits/detail;Generalized weakness;RLE deficits/detail RLE Deficits / Details: limited AROM but demos small movements in toes to command 2-3 times and trace activation with LE extension against min resistance. trace activation hip abd/add RLE Sensation: WNL RLE Coordination: WNL LLE Deficits / Details: limited AROM but demos small movements in toes to command 2-3 times and trace activation with LE extension against min resistance. trace activation hip abd/add LLE Sensation: WNL LLE Coordination: WNL    Cervical / Trunk Assessment Cervical / Trunk Assessment: Other exceptions Cervical / Trunk Exceptions: chest tube  Communication   Communication Communication: Impaired Factors Affecting Communication: Reduced clarity of speech;Difficulty expressing self;Other (comment) (minimal attempts at verbalizations)    Cognition Arousal: Lethargic Behavior During Therapy: Flat affect   PT - Cognitive impairments: Difficult to assess Difficult to assess due to: Level of arousal                     PT - Cognition Comments: pt with minimal verbalizations, able to answer her name and follow commands  intermittently with all extremities. limited alertness, falling asleep quickly and then difficult to rouse Following commands: Impaired Following commands impaired: Follows one step commands inconsistently, Follows one step commands with increased time     Cueing Cueing Techniques: Verbal cues, Gestural cues, Tactile cues     General Comments General comments (skin integrity, edema, etc.): BP elevated 160-170s, RN present and preparing BP meds. SPO2 >96% on HHF [place] 30L, RT came in at end of session to transition to [facility]. Daughter Jane present and educated on ROM    Exercises General Exercises - Upper Extremity Shoulder Flexion: PROM, Both, 5 reps Shoulder ABduction: PROM, Both, 5 reps Elbow Flexion: PROM, Both, 5 reps Elbow Extension: PROM, Both, 5 reps Wrist Flexion: PROM, Both, 5 reps Wrist Extension: PROM, Both, 5 reps Digit Composite Flexion: PROM, AAROM, 10 reps Composite Extension: PROM, AAROM, Both, 10 reps General Exercises - Lower Extremity Ankle Circles/Pumps: AAROM, PROM, 10 reps, Both Short Arc Quad: PROM, Both, 5 reps Heel Slides: PROM, Both, 5 reps Hip ABduction/ADduction: PROM, AAROM, Both, 5 reps   Assessment/Plan    PT Assessment Patient needs continued PT services  PT Problem List Decreased strength;Decreased activity tolerance;Decreased balance;Decreased mobility;Cardiopulmonary status limiting activity;Pain       PT Treatment Interventions DME instruction;Gait training;Stair training;Functional mobility training;Therapeutic activities;Therapeutic exercise;Balance training;Cognitive remediation;Patient/family education    PT Goals (Current goals can be found in the Care Plan section)  Acute Rehab PT Goals Patient Stated Goal: none stated this session PT Goal Formulation: With patient Time For Goal Achievement: 09/11/24 Potential to Achieve Goals: Fair    Frequency Min 3X/week  AM-PAC PT 6 Clicks Mobility  Outcome Measure Help needed turning  from your back to your side while in a flat bed without using bedrails?: Total Help needed moving from lying on your back to sitting on the side of a flat bed without using bedrails?: Total Help needed moving to and from a bed to a chair (including a wheelchair)?: Total Help needed standing up from a chair using your arms (e.g., wheelchair or bedside chair)?: Total Help needed to walk in hospital room?: Total Help needed climbing 3-5 steps with a railing? : Total 6 Click Score: 6    End of Session Equipment Utilized During Treatment: Oxygen Activity Tolerance: Patient limited by fatigue Patient left: in bed;with call bell/phone within reach;with bed alarm set;with nursing/sitter in room;with family/visitor present Nurse Communication: Mobility status PT Visit Diagnosis: Unsteadiness on feet (R26.81);Muscle weakness (generalized) (M62.81);Difficulty in walking, not elsewhere classified (R26.2);Pain Pain - part of body:  (back)    Time: 8564-8495 PT Time Calculation (min) (ACUTE ONLY): 29 min   Charges:   PT Evaluation $PT Re-evaluation: 1 Re-eval PT Treatments $Therapeutic Exercise: 8-22 mins PT General Charges $$ ACUTE PT VISIT: 1 Visit         Izetta Call, PT, DPT   Acute Rehabilitation Department Office [phone] Secure Chat Communication Preferred  Izetta JULIANNA Call 08/28/2024, 3:41 PM

## 2024-08-28 NOTE — Progress Notes (Signed)
 "  NAME:  [name], MRN:  [id number], DOB:  07-Jan-1956, LOS: 33 ADMISSION DATE:  07/26/2024, CONSULTATION DATE:  1/15 REFERRING MD:  Kerrin, CHIEF COMPLAINT:  sepsis   History of Present Illness:  [name] is a 69 y/o woman with a history of tobacco abuse and lung adenocarcinoma status post RML lobectomy on 12/29.  At the time of surgery was a large cyst that was resected. Despite oversewing it time resection she later developed an esophageal leak.  She required an esophageal stent which she has had recurrent problems with related to positioning/migration of the stent (necessitating multiple EGDs for stent revision). From 1/15 through 1/18, the patient was consistently total body balance positive in setting of receiving NS @ 100 mL/hr and then starting TPN as of 1/18. Due to concomitant AKI, developed volume overload and pulmonary edema and hypertensive urgency ultimately requiring a Cardene  drip be started overnight 1/19. Also on 1/19, she underwent revision of the position of her esophageal stent to address a small esophageal leak.  Pertinent  Medical History  Lung adenocarcinoma Latent  TB, treated in 1980 HTN HLD DM OSA Tobacco abuse  Significant Hospital Events: Including procedures, antibiotic start and stop dates in addition to other pertinent events   12/29 RML lobectomy 1/7 EGD with esophageal stent 1/9 EGD  with stent reposition after migration to stomach 1/15 vanc added, zosyn  restarted. CT  1/19: EGD for stent repositioning due to ongoing small esophageal leak / stent migration 1/20: Gastrograffin study -> no esophageal leak. Started on clear liquid diet. 1/21: Full liquid diet. Diuresed. 1/23 rapid rise in oxygen requirements, back to NPO 1/24 CT with multilobar crazy paving, remained on NRB. Added linzolid 1/25 blood cultures drawn, changed zosyn > cefepime  + flagyl  1/26 intubated overnight 1/27 bronch with BAL, broadened antifungal coverage 1/28 1 unit pRBC, weaned  FiO2, cortrak placed by Dr Daniel 1/30 still awaiting fungal results.  Still with significant positive fluid balance but does not look vascularly overloaded.  Chest x-ray does look a little bit better.  Respiratory compliance looks good.  Heavily sedated, working on titrating sedation down 1/31 Extubated.  Interim History / Subjective:  Patient is off sedation this morning.  She is alert and following commands.  She is been on pressure support ventilation for almost an hour pulling 700 to 800 cc of tidal volume.  Her RSBI is 28.  Very minimal secretions on inline suction.  She is euvolemic this morning and nephrology plans to run her net even today.  Yesterday's chest x-ray shows slightly improved pulmonary infiltrates.  Objective    Blood pressure (!) 110/59, pulse 98, temperature 98.4 F (36.9 C), resp. rate 13, height 5' 2.99 (1.6 m), weight 81.4 kg, last menstrual period 10/26/1998, SpO2 100%. CVP:  [1 mmHg-19 mmHg] 3 mmHg  Vent Mode: PSV;CPAP FiO2 (%):  [40 %] 40 % Set Rate:  [24 bmp] 24 bmp Vt Set:  [420 mL] 420 mL PEEP:  [5 cmH20-8 cmH20] 5 cmH20 Pressure Support:  [5 cmH20] 5 cmH20 Plateau Pressure:  [19 cmH20-21 cmH20] 19 cmH20   Intake/Output Summary (Last 24 hours) at 08/28/2024 0914 Last data filed at 08/28/2024 0900 Gross per 24 hour  Intake 3756.37 ml  Output 6466 ml  Net -2709.63 ml   Filed Weights   08/26/24 0500 08/27/24 0500 08/28/24 0455  Weight: 85.4 kg 85.3 kg 81.4 kg    Examination    Physical exam: General: Crtitically ill-appearing female, orally intubated HEENT: [place]/AT, eyes anicteric.  ETT and  cortrak in place Neuro: Alert and following commands eyes are closed.  Pupils 3 mm bilateral reactive to light Chest: Coarse breath sounds, no wheezes or rhonchi Heart: Regular rate and rhythm, no murmurs or gallops Abdomen: Soft, nondistended, bowel sounds present  Resolved problem list   Assessment and Plan   Esophageal leak- S/p esophageal stenting c/b  migration, now s/p repositioning by GI most recently on 1/19 Plan  Continue to monitoring chest tube output  has small bore NGT in place; has not migrated yet, still positioning unchanged so we will hold off on trickle feeds.  Would like to see tip closer to pylorus. NPO and continue TPN. need to continue to discuss GJ tube with family  Acute respiratory insufficiency with hypoxia, ARDS-differential diagnosis at this point aspiration due to dysphagia and stent related reflux versus another inflammatory source versus COP/post infectious inflammatory condition Pulmonary edema seems unlikely given aggressive diuresis BAL with mixed cell type and low PMNs as well as low eosinophils, RVP was negative Plan Continuing full ventilator support with daily assessment for spontaneous breathing trial Continuing broad-spectrum antibiotics, although all bacterial cultures have been negative thus far had been on several days of fluconazole , this was stopped on the 26th.  Antibiotic coverage was widened on 25th currently on linezolid , cefepime , micafungin  and Flagyl  Awaiting fungal culture - NGTD in 6 days. If fungal culture is negative, and we are still having trouble with parenchymal lung disease and oxygenation we will consider steroid initiation.   Extubate today to high flow nasal cannula.   Lung adenocarcinoma, s/p lobectomy plan chest tube per TCTS  Bronchial defect in BI; confirmed on bronchoscopy Plan Continue chest tube  Hypertensive urgency Plan Continuing clevidipine  for systolic blood pressure less than 150 Will keep clonidine  patch in place Continuing scheduled IV hydralazine , hopefully can transition this to via tube once we can use the GI route Volume removal as able  Low grade fevers> resolved, but may be masked by CRRT Plan Antibiotics as above  AKI c/b ATN and hypervolemia> now appears euvolemic on exam and renal function is worsening with diuresis Plan CRRT per nephrology  -keeping her even today. Keep Foley Renal dose medications Strict intake output  Anemia of critical illness - Received unit of blood on the 29th of obvious bleeding  Plan Trend CBC Trigger for transfusion less than 7 hemoglobin   H/o tobacco abuse plan recommend cessation, previously counseled   Hyperglycemia,  Better control today Plan Continuing Lantus  26 units daily Continue sliding scale insulin  glucose goal 140-180 Holding Jardiance   Deconditioning PlaN eventually will need CIR  Severe protein energy malnutrition Plan con't TPN Tubefeeds as above once tip closer to pyloris not an IR candidate for GJ placement due to overlying colon   35 minutes of Critical Care time.   Tamela Stakes, MD  Attending Physician, Critical Care Medicine [place] [facility] See Amion for pager For contact information, see Amion. If no response to pager, please call PCCM 2H APP. After hours, 7PM- 7AM, please call on call APP for 2H.   "

## 2024-08-28 NOTE — Progress Notes (Signed)
 12 Days Post-Op Procedures (LRB): EGD (ESOPHAGOGASTRODUODENOSCOPY) (N/A) CONTROL OF HEMORRHAGE, GI TRACT, ENDOSCOPIC, BY CLIPPING OR OVERSEWING Subjective: Intubated, intermittently alert and anxious   Objective: Vital signs in last 24 hours: Temp:  [98.1 F (36.7 C)-99 F (37.2 C)] 98.4 F (36.9 C) (01/31 0820) Pulse Rate:  [75-112] 98 (01/31 0820) Cardiac Rhythm: Normal sinus rhythm (01/31 0000) Resp:  [0-27] 13 (01/31 0820) BP: (110-157)/(56-70) 110/59 (01/31 0538) SpO2:  [97 %-100 %] 100 % (01/31 0820) Arterial Line BP: (126-161)/(41-62) 150/52 (01/31 0045) FiO2 (%):  [40 %] 40 % (01/31 0400) Weight:  [81.4 kg] 81.4 kg (01/31 0455)  Hemodynamic parameters for last 24 hours: CVP:  [1 mmHg-23 mmHg] 3 mmHg  Intake/Output from previous day: 01/30 0701 - 01/31 0700 In: 3731.4 [I.V.:2249.6; IV Piggyback:1481.8] Out: 6422 [Urine:547; Chest Tube:40] Intake/Output this shift: Total I/O In: 169.7 [I.V.:69.7; IV Piggyback:100] Out: 311 [Urine:25]  General appearance: intubated Neurologic: intact Heart: regular rate and rhythm Lungs: clear to auscultation bilaterally Abdomen: normal findings: soft, non-tender  Lab Results: Recent Labs    08/27/24 0513 08/28/24 0404  WBC 18.2* 18.8*  HGB 9.2* 8.9*  HCT 27.2* 26.0*  PLT 218 197   BMET:  Recent Labs    08/27/24 1537 08/28/24 0404  NA 133* 133*  K 4.0 4.2  CL 100 99  CO2 23 22  GLUCOSE 171* 172*  BUN 67* 54*  CREATININE 1.91* 1.42*  CALCIUM 7.9* 7.8*    PT/INR: No results for input(s): LABPROT, INR in the last 72 hours. ABG    Component Value Date/Time   PHART 7.297 (L) 08/26/2024 0735   HCO3 19.2 (L) 08/26/2024 0735   TCO2 20 (L) 08/26/2024 0735   ACIDBASEDEF 7.0 (H) 08/26/2024 0735   O2SAT 93 08/26/2024 0735   CBG (last 3)  Recent Labs    08/27/24 2316 08/28/24 0423 08/28/24 0759  GLUCAP 138* 167* 146*    Assessment/Plan: S/P Procedures (LRB): EGD (ESOPHAGOGASTRODUODENOSCOPY)  (N/A) CONTROL OF HEMORRHAGE, GI TRACT, ENDOSCOPIC, BY CLIPPING OR OVERSEWING - NEURO- intact CV- in SR, remains hypertensive  On clevedipine drip RESP- vent wean in progress  CXR improved with CRRT but still underlying airspace opacities bilaterally ID- completed linezolid   Still on cefepime  and micafungin  RENAL- 2.7 L negative with CRRT  Appears dry now with CVP 3-4 ENDO- CBG Ok GI- esophageal stent in place  Cor track still at pylorus  NPO Deconditioning severe   LOS: 33 days    [name] 08/28/2024

## 2024-08-28 NOTE — Plan of Care (Signed)
" °  Problem: Bowel/Gastric: Goal: Gastrointestinal status for postoperative course will improve Outcome: Progressing   Problem: Cardiac: Goal: Ability to maintain an adequate cardiac output Outcome: Progressing Goal: Will show no evidence of cardiac arrhythmias Outcome: Progressing   Problem: Nutritional: Goal: Will attain and maintain optimal nutritional status Outcome: Progressing   Problem: Neurological: Goal: Will regain or maintain usual level of consciousness Outcome: Progressing   Problem: Clinical Measurements: Goal: Ability to maintain clinical measurements within normal limits Outcome: Progressing   "

## 2024-08-28 NOTE — Procedures (Signed)
 Extubation Procedure Note  Patient Details:   Name: [name] DOB: April 16, 1956 MRN: [id number]   Airway Documentation:    Vent end date: 08/28/24 Vent end time: 0945   Evaluation  O2 sats: stable throughout Complications: No apparent complications Patient did tolerate procedure well. Bilateral Breath Sounds: Clear, Diminished   Yes Extubated to heated high flow nasal cannula. Cuff leak present prior to extubation, no stridor noted.  Adelayde Minney E Damarian Priola 08/28/2024, 10:53 AM

## 2024-08-28 NOTE — Progress Notes (Signed)
 PHARMACY - TOTAL PARENTERAL NUTRITION CONSULT NOTE  Indication: Esophageal leak  Patient Measurements: Height: 5' 2.99 (160 cm) Weight: 81.4 kg (179 lb 5.9 oz) IBW/kg (Calculated) : 52.38 TPN AdjBW (KG): 55.8 Body mass index is 31.78 kg/m.  Assessment:  16 YOF with NSCLC here for RML lobectomy, resection of esophageal diverticulum, and lymph node dissection on 12/29.  Found to have a small contained leak post-op.   Patient reports an intentional weight loss of 40 lbs over a couple of years when she was diagnosed with DM.  She typically drinks one Orgain shake every morning for breakfast and eats one meal a day, which could be pizza, spaghetti, chicken, beef or fish along with vegetables and fruits.  Patient was eating normally until her planned surgery. RD concerned with patient being malnourished. Patient was on TPN 12/31 - 1/12 and was advanced to dysphagia 1 diet. However, she was NPO again 1/14-1/16. On 1/17 IR reported small amount of leak from distal esophageal stent. Patient developed AKI since TPN was held. Pharmacy reconsulted for TPN 1/17.  Patient intubated overnight on 1/27. Currently on propofol  and cleviprex . Trial of nicardipine  on 1/26 without adequate BP control. Requiring propofol  for sedation, potential extubation on 1/30 so team would like to avoid versed . Precedex  likely with inadequate effect for full wean. Potential for Jtube / PEG at the end of the week per TCTS.   1/30: CRRT started yesterday 1/29, glc control improving. TG noted.  1/31: TG down after propofol  stopped >> changed to precedex , clevi down to 2 mg/hr, reglan  stopped 1/30   Glucose / Insulin : hx DM, A1c 5.9% - BG 124-174, 17 units SSI/24h, glargine 26 units/24hr per TCTS, insulin  removed from TPN on 1/27 (previously 20u, was stopped when TPN was adjusted for cleviprex )   Electrolytes: Na 133, K 4.2, CL 99, CO2 22, Phos: 3.1 (removed from TPN 1/27), Mg: 2.3 (removed from TPN 1/27), CoCa: 9 Renal: AKI on  CRRT- SCr 1.42 down (BL 0.8), BUN 54  Hepatic: 1/29- LFTs / tbili WNL, albumin  2.6, TG 213 1/31 (noted clevidipine  added on 1/26)  Intake / Output; MIVF: UOP 527 mL, LBM 1/26, chest tube 40 mL , last lasix  1/24  Net IO Since Admission: 22,915.91 mL [08/28/24 0645] CRRT removed 5645 L   GI Imaging:   12/30 esophagram: Positive for contrast extravasation and an esophageal leak in the mid esophagus 1/2 esophagram: Persistent contrast extravasation and esophageal leak, similar to prior 1/6 esophagram: Limited contrast study due to patient aspiration and severe coughing spell. Study aborted. 1/12 esophagram: Patent esophageal stent. No evidence for extravasation or leak, Delayed esophageal emptying and gastroesophageal reflux leading to stasis of contrast in the esophagus. 1/15 CT: displaced esophageal stent in stomach, no bowel obstruction  1/17 esophagram: small volume esophageal leak at distal stent 1/20 esophagram: no evidence of any leak  GI Surgeries / Procedures:  12/30 right middle lobectomy, resection esophageal diverticulum, lymph node biopsy  1/7 EGD- stent placement  1/9 EGD stent repositioned d/t migration to stomach  1/16 EGD- stent placed at esophageal ulcer, removed gastric stent, normal duoenum  1/19 EGD - stent replaced / repositioned   Central access: PICC 07/28/24 TPN start date: 07/28/24 > 1/12; 1/17 >>   Nutritional Goals:  Goal unconcentrated TPN rate 75 ml/hr to provide 101g AA and 1721 kCal Goal concentrated TPN rate 60 mL/hr to provide 101 g AA and 1743 kCal  RD Estimated Needs Total Energy Estimated Needs: 1700-1900 Total Protein Estimated Needs: 100-115g Total  Fluid Estimated Needs: >1.7L/day  Current Nutrition:  TPN 1/17 >> 1/08 CLD - passed swallow 1/09 CLD - patient feels can't swallow well > NPO 1/13 advanced to DYS1 1/14 NPO  1/21 FLD, Glucerna x1 on 1/21, milkshake per patient  1/24 NPO    Plan: Continue adjusted TPN to meet kcal needs while  taking into account cleviprex  (propofol  discontinued).   Cleviprex  infusing at 4-16 mL/hr. Total cleviprex  volume in last 24 hours = ~ 332 mL, = ~ 664 kcal from cleviprex .   Propofol  discontinued at 13:13 on 1/30.  Concentrated TPN at 45 mL/hr @ 18:00. Will provide 110g of protein (increased with CRRT) and 1175 kcal total (with 216 g CHO). With cleviprex  will be meeting approximately 100% of nutritional needs (~1839 kcal). No lipids in TPN until cleviprex  off.   Electrolytes in TPN: Na 50 mEq/L, K 0 mEq/L, Ca 0 mEq/L, Mg 0 mEq/L, Phos 15 mmol/L. Cl:Ac max chloride (1:2.36 ratio).  Started CRRT on 1/29, TPN adjusted accordingly as above  Add standard MVI and trace elements to TPN Continue resistant SSI to q4h and adjust as needed Insulin  glargine decreased to 26 units QD per TCTS on 1/29.  Monitor TPN labs on Mon/Thurs, and daily given AKI. CRRT labs are ordered.   Thank you for allowing pharmacy to be a part of this patients care.  Rankin Sams, PharmD, BCCCP Clinical Pharmacist

## 2024-08-28 NOTE — Progress Notes (Addendum)
 [facility] NEPHROLOGY PROGRESS NOTE  Assessment/ Plan: Pt is a 69 y.o. yo female    # Acute kidney Injury: Suspected to have ATN secondary to ischemic and nephrotoxic etiologies.  Serologies negative for GN including vasculitis/anti-GBM given previous concerns of gross hematuria.  Likely ATN with continued worsening of renal function in the setting of need for diuresis for volume unloading, severe anemia with ongoing hemodynamic changes.  -Started CRRT on 1/29 for azotemia.  Tolerating CRRT well, lower UF goal to 0-50 cc an hour, monitor lab and adjust CRRT prescription.  May be able to wean off CRRT tomorrow morning.  Discussed with ICU team. -Strict ins and out, monitor labs.  # Esophageal leak following resection of esophageal diverticulum: Underwent placement of a new esophageal stent previous one migrated distally into the stomach.  EGD and esophagram without evidence of leak.   # Hypertensive urgency: Required Cleviprex  IV, monitor BP.  # Anemia: Requiring blood transfusion.  Monitor hemoglobin.  # Acute hypoxic respiratory failure: Suspected to be multifactorial with pneumonia versus pneumonitis/acute lung injury and possibly superimposed pulmonary edema.  On broad-spectrum antibiotics with cefepime /fluconazole /linezolid  as well as ongoing diuretic.  Intubated on 1/27 and had bronchoscopy.  Discussed with ICU team.  Subjective: Seen and examined in ICU.  Urine output is around 547 cc.  Tolerating CRRT well.  Remains intubated, sedated.  No major event. Objective Vital signs in last 24 hours: Vitals:   08/28/24 0915 08/28/24 0920 08/28/24 0925 08/28/24 0945  BP:      Pulse: (!) 104 (!) 103 (!) 106 (!) 116  Resp: 19 20 (!) 23 18  Temp: 98.6 F (37 C) 98.8 F (37.1 C) 98.8 F (37.1 C) 99 F (37.2 C)  TempSrc:      SpO2: 99% 100% 99% 97%  Weight:      Height:       Weight change: -3.94 kg  Intake/Output Summary (Last 24 hours) at 08/28/2024 1020 Last data  filed at 08/28/2024 0900 Gross per 24 hour  Intake 3638.49 ml  Output 6466 ml  Net -2827.51 ml       Labs: RENAL PANEL Recent Labs  Lab 08/24/24 0419 08/24/24 0505 08/25/24 0409 08/25/24 0735 08/26/24 0334 08/26/24 0735 08/26/24 1600 08/27/24 0513 08/27/24 1537 08/28/24 0404  NA 141   < > 136   < > 136 136 136 134*  134* 133* 133*  K 5.0   < > 3.9   < > 4.1 4.0 4.0 3.7  3.7 4.0 4.2  CL 106  --  100  --  100  --  100 100  100 100 99  CO2 21*  --  19*  --  18*  --  19* 22  22 23 22   GLUCOSE 247*  --  278*  --  165*  --  119* 117*  119* 171* 172*  BUN 94*  --  110*  --  144*  --  117* 83*  84* 67* 54*  CREATININE 3.79*  --  4.23*  --  5.06*  --  3.91* 2.47*  2.45* 1.91* 1.42*  CALCIUM 8.9  --  8.5*  --  8.6*  --  8.0* 7.9*  8.0* 7.9* 7.8*  MG 2.4  --  2.1  --  2.2  --   --  2.2  --  2.3  PHOS 5.2*  --  3.7  --  5.0*  --  4.3 2.9  2.9 2.8 3.1  ALBUMIN   --   --  2.3*  --  2.4*  --  2.5* 2.6*  2.6* 2.6* 2.5*   < > = values in this interval not displayed.    Liver Function Tests: Recent Labs  Lab 08/26/24 0334 08/26/24 1600 08/27/24 0513 08/27/24 1537 08/28/24 0404  AST 22  --  23  --  25  ALT 18  --  19  --  20  ALKPHOS 98  --  103  --  127*  BILITOT 0.2  --  0.2  --  0.3  PROT 5.1*  --  5.5*  --  5.3*  ALBUMIN  2.4*   < > 2.6*  2.6* 2.6* 2.5*   < > = values in this interval not displayed.   No results for input(s): LIPASE, AMYLASE in the last 168 hours. No results for input(s): AMMONIA in the last 168 hours. CBC: Recent Labs    08/26/24 0334 08/26/24 0735 08/26/24 1241 08/27/24 0513 08/28/24 0404  HGB 7.0* 6.8* 8.4* 9.2* 8.9*  MCV 90.5  --  91.5 91.3 89.7    Cardiac Enzymes: No results for input(s): CKTOTAL, CKMB, CKMBINDEX, TROPONINI in the last 168 hours. CBG: Recent Labs  Lab 08/27/24 1534 08/27/24 1944 08/27/24 2316 08/28/24 0423 08/28/24 0759  GLUCAP 151* 134* 138* 167* 146*    Iron Studies: No results for  input(s): IRON, TIBC, TRANSFERRIN, FERRITIN in the last 72 hours. Studies/Results: DG Abd Portable 1V Result Date: 08/27/2024 EXAM: 1 VIEW XRAY OF THE ABDOMEN 08/27/2024 08:08:00 AM COMPARISON: 08/26/2024 CLINICAL HISTORY: Nasogastric tube present. FINDINGS: LINES, TUBES AND DEVICES: Partially imaged esophageal stent in place. Stable weighted enteric tube with tip near the gastric antrum. BOWEL: Nonobstructive bowel gas pattern. SOFT TISSUES: No abnormal calcifications. BONES: No acute fracture. LUNGS: Stable bilateral airspace opacities in the visualized lungs. IMPRESSION: 1. Stable weighted enteric tube with tip near the gastric antrum. Electronically signed by: Waddell Calk MD 08/27/2024 08:42 AM EST RP Workstation: [id number]   DG CHEST PORT 1 VIEW Result Date: 08/27/2024 EXAM: 1 VIEW XRAY OF THE CHEST 08/27/2024 05:37:00 AM COMPARISON: 08/26/2024 CLINICAL HISTORY: Pneumonia. FINDINGS: LINES, TUBES AND DEVICES: Stable right IJ CVC, endotracheal tube, enteric tube and esophageal stent. LUNGS AND PLEURA: Bilateral airspace opacities, left greater than right, slightly improved. Small bilateral pleural effusions. No pneumothorax. HEART AND MEDIASTINUM: Aortic atherosclerosis. No acute abnormality of the cardiac and mediastinal silhouettes. BONES AND SOFT TISSUES: No acute osseous abnormality. IMPRESSION: 1. Bilateral airspace opacities, left greater than right, slightly improved. 2. Small bilateral pleural effusions. Electronically signed by: Waddell Calk MD 08/27/2024 07:10 AM EST RP Workstation: [id number]   DG Chest Port 1 View Result Date: 08/26/2024 EXAM: 1 VIEW(S) XRAY OF THE CHEST 08/26/2024 10:50:00 AM COMPARISON: 08/25/2024 CLINICAL HISTORY: Encounter for central line placement. FINDINGS: LINES, TUBES AND DEVICES: Endotracheal and enteric tubes stable in position. Right upper extremity PICC stable in position. New right IJ central venous catheter with tip at mid SVC. Esophageal stent  stable in position. LUNGS AND PLEURA: Small bilateral pleural effusions. Persistent diffuse bilateral airspace opacities, unchanged from prior exam. No pneumothorax. HEART AND MEDIASTINUM: Aortic atherosclerotic calcifications. No acute abnormality of the cardiac and mediastinal silhouettes. BONES AND SOFT TISSUES: No acute osseous abnormality. IMPRESSION: 1. New right internal jugular central venous catheter with tip projecting at the mid superior vena cava. No pneumothorax identified. 2. Persistent diffuse bilateral airspace opacities, unchanged from prior exam. 3. Small bilateral pleural effusions. Electronically signed by: Waddell Calk MD 08/26/2024 11:25 AM EST RP Workstation: [id number]    Medications:  Infusions:  acetaminophen      Followed by   NOREEN ON 08/29/2024] acetaminophen      ceFEPime  (MAXIPIME ) IV Stopped (08/28/24 0232)   clevidipine  16 mg/hr (08/28/24 1009)   dexmedetomidine  (PRECEDEX ) IV infusion Stopped (08/28/24 0859)   famotidine  (PEPCID ) IV 10 mg (08/28/24 0933)   fentaNYL  infusion INTRAVENOUS Stopped (08/28/24 0604)   metronidazole  100 mL/hr at 08/28/24 0900   micafungin  (MYCAMINE ) 100 mg in sodium chloride  0.9 % 100 mL IVPB Stopped (08/27/24 1209)   midazolam  Stopped (08/26/24 1317)   prismasol  BGK 4/2.5 400 mL/hr at 08/27/24 2344   prismasol  BGK 4/2.5 400 mL/hr at 08/27/24 2344   prismasol  BGK 4/2.5 1,500 mL/hr at 08/28/24 0757   promethazine  (PHENERGAN ) injection (IM or IVPB) Stopped (08/19/24 1554)   TPN ADULT (ION) 45 mL/hr at 08/28/24 0900   TPN ADULT (ION)      Scheduled Medications:  sodium chloride    Intravenous Once   Chlorhexidine  Gluconate Cloth  6 each Topical Daily   cloNIDine   0.2 mg Transdermal Weekly   fentaNYL   1 patch Transdermal Q72H   hydrALAZINE   10 mg Intravenous Q6H   insulin  aspart  0-20 Units Subcutaneous Q4H   insulin  glargine  26 Units Subcutaneous Daily   ipratropium-albuterol   3 mL Nebulization TID   lidocaine   2 patch Transdermal  Q24H   neomycin -bacitracin -polymyxin   Topical BID   nitroGLYCERIN   0.3 mg Transdermal Daily   mouth rinse  15 mL Mouth Rinse Q2H   sodium chloride  flush  10-40 mL Intracatheter Q12H    have reviewed scheduled and prn medications.  Physical Exam: General: Intubated, sedated. Heart:RRR, s1s2 nl Lungs: Diffuse Rales and crackles. Abdomen:soft, Non-tender, non-distended Extremities:No edema Neurology: Sedated. Dialysis access: Right IJ temporary HD catheter placed by PCCM on 1/29.  Anasia Agro Prasad Jamisen Hawes 08/28/2024,10:20 AM  LOS: 33 days

## 2024-08-28 NOTE — Progress Notes (Signed)
 Interim CCM Progress Note:   Notified by RN that phos was 2.3. Patient on CRRT   Patient also on TPN, but per pharm note removed from TPN on 1/27.   Notified pharmacy of low phos and may need to be added back to TPN since on CRRT in the next or so.   Order 15 mmol of K Phos    Repeat labs in AM   [facility] Pulmonary & Critical Care 08/12/2024, 8:41 PM  Call Cell: [phone] if have any emergent needs - 7p-7a

## 2024-08-28 NOTE — Progress Notes (Signed)
" ° °  [address], [place] [address]             [phone]   Extubated earlier today Denies pain  BP (!) 110/59   Pulse (!) 105   Temp 99.7 F (37.6 C)   Resp (!) 23   Ht 5' 2.99 (1.6 m)   Wt 81.4 kg   LMP 10/26/1998 Comment: ~IN 40s  SpO2 97%   BMI 31.78 kg/m   I 1400/ O 2038  K 3.6  BP control remains problematic  Rosalia Mcavoy C. Kerrin, MD [facility] [phone]  "

## 2024-08-29 ENCOUNTER — Inpatient Hospital Stay (HOSPITAL_COMMUNITY)

## 2024-08-29 DIAGNOSIS — C342 Malignant neoplasm of middle lobe, bronchus or lung: Secondary | ICD-10-CM | POA: Diagnosis not present

## 2024-08-29 DIAGNOSIS — I160 Hypertensive urgency: Secondary | ICD-10-CM | POA: Diagnosis not present

## 2024-08-29 DIAGNOSIS — J80 Acute respiratory distress syndrome: Secondary | ICD-10-CM | POA: Diagnosis not present

## 2024-08-29 DIAGNOSIS — N179 Acute kidney failure, unspecified: Secondary | ICD-10-CM | POA: Diagnosis not present

## 2024-08-29 LAB — GLUCOSE, CAPILLARY
Glucose-Capillary: 157 mg/dL — ABNORMAL HIGH (ref 70–99)
Glucose-Capillary: 110 mg/dL — ABNORMAL HIGH (ref 70–99)
Glucose-Capillary: 175 mg/dL — ABNORMAL HIGH (ref 70–99)
Glucose-Capillary: 155 mg/dL — ABNORMAL HIGH (ref 70–99)
Glucose-Capillary: 142 mg/dL — ABNORMAL HIGH (ref 70–99)

## 2024-08-29 LAB — CBC
HCT: 27.6 % — ABNORMAL LOW (ref 36.0–46.0)
Hemoglobin: 9.9 g/dL — ABNORMAL LOW (ref 12.0–15.0)
MCH: 31.4 pg (ref 26.0–34.0)
MCHC: 35.9 g/dL (ref 30.0–36.0)
MCV: 87.6 fL (ref 80.0–100.0)
Platelets: 251 10*3/uL (ref 150–400)
RBC: 3.15 MIL/uL — ABNORMAL LOW (ref 3.87–5.11)
RDW: 15.4 % (ref 11.5–15.5)
WBC: 32.9 10*3/uL — ABNORMAL HIGH (ref 4.0–10.5)
nRBC: 0.3 % — ABNORMAL HIGH (ref 0.0–0.2)

## 2024-08-29 LAB — RENAL FUNCTION PANEL
Albumin: 2.8 g/dL — ABNORMAL LOW (ref 3.5–5.0)
Anion gap: 13 (ref 5–15)
BUN: 41 mg/dL — ABNORMAL HIGH (ref 8–23)
CO2: 21 mmol/L — ABNORMAL LOW (ref 22–32)
Calcium: 8.2 mg/dL — ABNORMAL LOW (ref 8.9–10.3)
Chloride: 97 mmol/L — ABNORMAL LOW (ref 98–111)
Creatinine, Ser: 1.12 mg/dL — ABNORMAL HIGH (ref 0.44–1.00)
GFR, Estimated: 54 mL/min — ABNORMAL LOW
Glucose, Bld: 170 mg/dL — ABNORMAL HIGH (ref 70–99)
Phosphorus: 3.3 mg/dL (ref 2.5–4.6)
Potassium: 4.2 mmol/L (ref 3.5–5.1)
Sodium: 131 mmol/L — ABNORMAL LOW (ref 135–145)

## 2024-08-29 LAB — COMPREHENSIVE METABOLIC PANEL WITH GFR
ALT: 45 U/L — ABNORMAL HIGH (ref 0–44)
AST: 49 U/L — ABNORMAL HIGH (ref 15–41)
Albumin: 2.8 g/dL — ABNORMAL LOW (ref 3.5–5.0)
Alkaline Phosphatase: 127 U/L — ABNORMAL HIGH (ref 38–126)
Anion gap: 12 (ref 5–15)
BUN: 48 mg/dL — ABNORMAL HIGH (ref 8–23)
CO2: 22 mmol/L (ref 22–32)
Calcium: 8.1 mg/dL — ABNORMAL LOW (ref 8.9–10.3)
Chloride: 99 mmol/L (ref 98–111)
Creatinine, Ser: 1.23 mg/dL — ABNORMAL HIGH (ref 0.44–1.00)
GFR, Estimated: 48 mL/min — ABNORMAL LOW
Glucose, Bld: 161 mg/dL — ABNORMAL HIGH (ref 70–99)
Potassium: 3.7 mmol/L (ref 3.5–5.1)
Sodium: 133 mmol/L — ABNORMAL LOW (ref 135–145)
Total Bilirubin: 0.4 mg/dL (ref 0.0–1.2)
Total Protein: 5.7 g/dL — ABNORMAL LOW (ref 6.5–8.1)

## 2024-08-29 LAB — FUNGUS CULTURE, BLOOD: Culture: NO GROWTH

## 2024-08-29 LAB — PHOSPHORUS: Phosphorus: 2.9 mg/dL (ref 2.5–4.6)

## 2024-08-29 LAB — MAGNESIUM: Magnesium: 2.5 mg/dL — ABNORMAL HIGH (ref 1.7–2.4)

## 2024-08-29 MED ORDER — IPRATROPIUM-ALBUTEROL 0.5-2.5 (3) MG/3ML IN SOLN
3.0000 mL | Freq: Two times a day (BID) | RESPIRATORY_TRACT | Status: AC
  Administered 2024-08-29 – 2024-09-03 (×9): 3 mL via RESPIRATORY_TRACT
  Filled 2024-08-29 (×11): qty 3

## 2024-08-29 MED ORDER — DEXTROSE 10 % IV SOLN: INTRAVENOUS | Status: AC

## 2024-08-29 MED ORDER — TRACE MINERALS CU-MN-SE-ZN 300-55-60-3000 MCG/ML IV SOLN
INTRAVENOUS | Status: AC
  Filled 2024-08-29: qty 739.2

## 2024-08-29 MED ORDER — GERHARDT'S BUTT CREAM
TOPICAL_CREAM | Freq: Every day | CUTANEOUS | Status: AC
  Administered 2024-08-30 – 2024-09-03 (×3): 1 via TOPICAL
  Filled 2024-08-29: qty 60

## 2024-08-29 NOTE — Progress Notes (Addendum)
 PHARMACY - TOTAL PARENTERAL NUTRITION CONSULT NOTE  Indication: Esophageal leak  Patient Measurements: Height: 5' 2.99 (160 cm) Weight: 78.9 kg (173 lb 15.1 oz) IBW/kg (Calculated) : 52.38 TPN AdjBW (KG): 55.8 Body mass index is 30.82 kg/m.  Assessment:  29 YOF with NSCLC here for RML lobectomy, resection of esophageal diverticulum, and lymph node dissection on 12/29.  Found to have a small contained leak post-op.   Patient reports an intentional weight loss of 40 lbs over a couple of years when she was diagnosed with DM.  She typically drinks one Orgain shake every morning for breakfast and eats one meal a day, which could be pizza, spaghetti, chicken, beef or fish along with vegetables and fruits.  Patient was eating normally until her planned surgery. RD concerned with patient being malnourished. Patient was on TPN 12/31 - 1/12 and was advanced to dysphagia 1 diet. However, she was NPO again 1/14-1/16. On 1/17 IR reported small amount of leak from distal esophageal stent. Patient developed AKI since TPN was held. Pharmacy reconsulted for TPN 1/17.  Patient intubated overnight on 1/27. Currently on propofol  and cleviprex . Trial of nicardipine  on 1/26 without adequate BP control. Requiring propofol  for sedation, potential extubation on 1/30 so team would like to avoid versed . Precedex  likely with inadequate effect for full wean. Potential for Jtube / PEG at the end of the week per TCTS.   1/30: CRRT started yesterday 1/29, glc control improving. TG noted.  1/31: TG down after propofol  stopped >> changed to precedex , clevi down to 2 mg/hr, reglan  stopped 1/30  2/1 adjusted weight = 63 kg  Glucose / Insulin : hx DM, A1c 5.9% - BG 146-178, 23 units SSI/24h, glargine 26 units/24hr per TCTS, insulin  removed from TPN on 1/27 (previously 20u, was stopped when TPN was adjusted for cleviprex )   Electrolytes: Na 133, Phos 2.9 (team gave kphos 15 mmol, phos 16 mmol in TPN), Mg: 2.5, CoCa: 9 Renal: AKI  on CRRT- SCr 1.23 (BL 0.8), BUN 48 Hepatic: alk phos 2.9, AST49, ALT 45, tbili WNL, albumin  2.6, TG 213 1/31 (noted clevidipine  added on 1/26)  Intake / Output; MIVF: UOP 0.4 mL/kg/hr, LBM 1/31, chest tube 20 mL, CRRT Net IO Since Admission: 22,105.76 mL [08/29/24 0726]  GI Imaging:   12/30 esophagram: Positive for contrast extravasation and an esophageal leak in the mid esophagus 1/2 esophagram: Persistent contrast extravasation and esophageal leak, similar to prior 1/6 esophagram: Limited contrast study due to patient aspiration and severe coughing spell. Study aborted. 1/12 esophagram: Patent esophageal stent. No evidence for extravasation or leak, Delayed esophageal emptying and gastroesophageal reflux leading to stasis of contrast in the esophagus. 1/15 CT: displaced esophageal stent in stomach, no bowel obstruction  1/17 esophagram: small volume esophageal leak at distal stent 1/20 esophagram: no evidence of any leak  1/28 KUB: Nonobstructive bowel gas pattern  1/29 KUB: bowel gas pattern is normal 1/30 KUB: Nonobstructive bowel gas pattern GI Surgeries / Procedures:  12/30 right middle lobectomy, resection esophageal diverticulum, lymph node biopsy  1/7 EGD- stent placement  1/9 EGD stent repositioned d/t migration to stomach  1/16 EGD- stent placed at esophageal ulcer, removed gastric stent, normal duoenum  1/19 EGD - stent replaced / repositioned   Central access: PICC 07/28/24 TPN start date: 07/28/24 > 1/12; 1/17 >>   Nutritional Goals:  Goal unconcentrated TPN rate 75 ml/hr to provide 101g AA and 1721 kCal Goal concentrated TPN rate 60 mL/hr to provide 101 g AA and 1743 kCal  RD Estimated Needs Total Energy Estimated Needs: 1700-1900 Total Protein Estimated Needs: 100-115g Total Fluid Estimated Needs: >1.7L/day  Current Nutrition:  TPN 1/17 >> 1/08 CLD - passed swallow 1/09 CLD - patient feels can't swallow well > NPO 1/13 advanced to DYS1 1/14 NPO  1/21  FLD, Glucerna x1 on 1/21, milkshake per patient  1/24 NPO  1/31 ~05:00 TPN pump alerting for air-in-line though RN could not find any issue with it. Switched to D10 for 12 hours   Plan: Adjust TPN without lipids to 35 ml/hr, provides 111g protein and 601 kcals, meeting 100% goals with cleviprex    Cleviprex  infusing at 20-42 mL/hr at 2 kcal/ml, = ~1632 kcal from cleviprex  Propofol  discontinued at 13:13 on 1/30.  Electrolytes in TPN: increase Na 100 mEq/L, K 0 mEq/L, Ca 0 mEq/L, Mg 0 mEq/L, decrease Phos 15 mmol/L. Cl:Ac max chloride (1:1.4).  Add standard MVI and trace elements to TPN Continue resistant SSI to q4h and adjust as needed Discussed with CCM, stop Insulin  glargine 26 units given giving minimal dextrose  in TPN  Monitor TPN labs on Mon/Thurs, and daily given CRRT  F/u CRRT plans, adjust TPN accordingly    Thank you for allowing pharmacy to be a part of this patients care.  Jinnie Door, PharmD, BCPS, BCCP Clinical Pharmacist  Please check AMION for all [facility] phone numbers After 10:00 PM, call Main Pharmacy [phone]

## 2024-08-29 NOTE — Progress Notes (Signed)
 [facility] NEPHROLOGY PROGRESS NOTE  Assessment/ Plan: Pt is a 69 y.o. yo female    # Acute kidney Injury: Suspected to have ATN secondary to ischemic and nephrotoxic etiologies.  Serologies negative for GN including vasculitis/anti-GBM given previous concerns of gross hematuria.  Likely ATN with continued worsening of renal function in the setting of need for diuresis for volume unloading, severe anemia with ongoing hemodynamic changes.  -Started CRRT on 1/29 for azotemia.  Tolerating CRRT well, BP elevated, change UF goal to 50-100 cc an hour.  Continue current CRRT prescription and may be able to discontinue tomorrow morning. Discussed with ICU team. -Strict ins and out, monitor labs.  # Esophageal leak following resection of esophageal diverticulum: Underwent placement of a new esophageal stent previous one migrated distally into the stomach.  EGD and esophagram without evidence of leak.   # Hypertensive urgency: Required Cleviprex  IV, monitor BP.  # Anemia: Requiring blood transfusion.  Monitor hemoglobin.  # Acute hypoxic respiratory failure: Suspected to be multifactorial with pneumonia versus pneumonitis/acute lung injury and possibly superimposed pulmonary edema.  On broad-spectrum antibiotics with cefepime /fluconazole /linezolid  as well as ongoing diuretic.  Intubated on 1/27 and had bronchoscopy.  # Hypophosphatemia due to CRRT, requiring repletion.  Discussed with ICU team and patient's daughter at the bedside.  Subjective: Seen and examined in ICU.  Urine output 715 cc.  She was extubated currently on nasal cannula.  Alert awake and following simple commands.  No other new event.  Family member at bedside. Objective Vital signs in last 24 hours: Vitals:   08/29/24 0925 08/29/24 0930 08/29/24 0935 08/29/24 0940  BP:      Pulse: 92 90 92 96  Resp: (!) 21 (!) 21 (!) 22 (!) 28  Temp: 98.6 F (37 C) 98.6 F (37 C) 98.6 F (37 C) 98.6 F (37 C)  TempSrc:       SpO2: 99% 99% 99% 98%  Weight:      Height:       Weight change: -2.46 kg  Intake/Output Summary (Last 24 hours) at 08/29/2024 1024 Last data filed at 08/29/2024 1000 Gross per 24 hour  Intake 3239.19 ml  Output 3816 ml  Net -576.81 ml       Labs: RENAL PANEL Recent Labs  Lab 08/25/24 0409 08/25/24 0735 08/26/24 0334 08/26/24 0735 08/27/24 0513 08/27/24 1537 08/28/24 0404 08/28/24 1602 08/29/24 0418  NA 136   < > 136   < > 134*  134* 133* 133* 133* 133*  K 3.9   < > 4.1   < > 3.7  3.7 4.0 4.2 3.7 3.7  CL 100  --  100   < > 100  100 100 99 98 99  CO2 19*  --  18*   < > 22  22 23 22 22 22   GLUCOSE 278*  --  165*   < > 117*  119* 171* 172* 178* 161*  BUN 110*  --  144*   < > 83*  84* 67* 54* 48* 48*  CREATININE 4.23*  --  5.06*   < > 2.47*  2.45* 1.91* 1.42* 1.26* 1.23*  CALCIUM 8.5*  --  8.6*   < > 7.9*  8.0* 7.9* 7.8* 8.4* 8.1*  MG 2.1  --  2.2  --  2.2  --  2.3  --  2.5*  PHOS 3.7  --  5.0*   < > 2.9  2.9 2.8 3.1 2.3* 2.9  ALBUMIN  2.3*  --  2.4*   < > 2.6*  2.6* 2.6* 2.5* 2.9* 2.8*   < > = values in this interval not displayed.    Liver Function Tests: Recent Labs  Lab 08/27/24 0513 08/27/24 1537 08/28/24 0404 08/28/24 1602 08/29/24 0418  AST 23  --  25  --  49*  ALT 19  --  20  --  45*  ALKPHOS 103  --  127*  --  127*  BILITOT 0.2  --  0.3  --  0.4  PROT 5.5*  --  5.3*  --  5.7*  ALBUMIN  2.6*  2.6*   < > 2.5* 2.9* 2.8*   < > = values in this interval not displayed.   No results for input(s): LIPASE, AMYLASE in the last 168 hours. No results for input(s): AMMONIA in the last 168 hours. CBC: Recent Labs    08/26/24 0735 08/26/24 1241 08/27/24 0513 08/28/24 0404 08/29/24 0418  HGB 6.8* 8.4* 9.2* 8.9* 9.9*  MCV  --  91.5 91.3 89.7 87.6    Cardiac Enzymes: No results for input(s): CKTOTAL, CKMB, CKMBINDEX, TROPONINI in the last 168 hours. CBG: Recent Labs  Lab 08/28/24 1556 08/28/24 1925 08/28/24 2316 08/29/24 0337  08/29/24 0806  GLUCAP 164* 174* 177* 157* 110*    Iron Studies: No results for input(s): IRON, TIBC, TRANSFERRIN, FERRITIN in the last 72 hours. Studies/Results: DG Chest Port 1 View Result Date: 08/29/2024 EXAM: 1 VIEW(S) XRAY OF THE CHEST 08/29/2024 05:35:00 AM COMPARISON: 08/27/2024 CLINICAL HISTORY: Status post lobectomy of lung. ICD-9: 241.489 Acquired absence of lung. FINDINGS: LINES, TUBES AND DEVICES: Right internal jugular central venous catheter in place with tip at [facility]. Right upper extremity PICC (peripherally inserted central catheter) in place with tip at [facility]. Enteric tube in place, extending below diaphragm with tip out of field of view. Esophageal stent in place. LUNGS AND PLEURA: Low lung volumes. Right mid lung postsurgical changes. Persistent bilateral airspace opacities, left greater than right. Small bilateral pleural effusions, unchanged. No pneumothorax. HEART AND MEDIASTINUM: Cardiac leads noted. No acute abnormality of the cardiac and mediastinal silhouettes. BONES AND SOFT TISSUES: No acute osseous abnormality. IMPRESSION: 1. Persistent bilateral interstitial and airspace opacities, left greater than right. 2. Small bilateral pleural effusions, unchanged. Electronically signed by: Waddell Calk MD 08/29/2024 06:23 AM EST RP Workstation: [id number]    Medications: Infusions:  acetaminophen      ceFEPime  (MAXIPIME ) IV Stopped (08/29/24 0240)   clevidipine  21 mg/hr (08/29/24 1000)   dexmedetomidine  (PRECEDEX ) IV infusion Stopped (08/28/24 0859)   dextrose  45 mL/hr at 08/29/24 1000   famotidine  (PEPCID ) IV 10 mg (08/29/24 1009)   fentaNYL  infusion INTRAVENOUS Stopped (08/28/24 0604)   metronidazole  100 mL/hr at 08/29/24 1000   micafungin  (MYCAMINE ) 100 mg in sodium chloride  0.9 % 100 mL IVPB Stopped (08/28/24 1223)   midazolam  Stopped (08/26/24 1317)   prismasol  BGK 4/2.5 400 mL/hr at 08/29/24 0654   prismasol  BGK  4/2.5 400 mL/hr at 08/29/24 0206   prismasol  BGK 4/2.5 1,500 mL/hr at 08/29/24 0726   promethazine  (PHENERGAN ) injection (IM or IVPB) Stopped (08/19/24 1554)   TPN ADULT (ION) Stopped (08/29/24 0508)   TPN ADULT (ION)      Scheduled Medications:  sodium chloride    Intravenous Once   Chlorhexidine  Gluconate Cloth  6 each Topical Daily   [START ON 08/31/2024] cloNIDine   0.3 mg Transdermal Weekly   fentaNYL   1 patch Transdermal Q72H   hydrALAZINE   20 mg Intravenous Q6H   insulin   aspart  0-20 Units Subcutaneous Q4H   ipratropium-albuterol   3 mL Nebulization TID   lidocaine   2 patch Transdermal Q24H   metoprolol  tartrate  10 mg Intravenous Q6H   neomycin -bacitracin -polymyxin   Topical BID   nitroGLYCERIN   0.3 mg Transdermal Daily   sodium chloride  flush  10-40 mL Intracatheter Q12H    have reviewed scheduled and prn medications.  Physical Exam: General: Ill-looking female, extubated, alert and awake. Heart:RRR, s1s2 nl Lungs: Diffuse Rales and crackles. Abdomen:soft, Non-tender, non-distended Extremities:No edema Neurology: Alert awake and following command Dialysis access: Right IJ temporary HD catheter placed by PCCM on 1/29.  Kemonte Ullman Prasad Viridiana Spaid 08/29/2024,10:24 AM  LOS: 34 days

## 2024-08-29 NOTE — Progress Notes (Signed)
 13 Days Post-Op Procedures (LRB): EGD (ESOPHAGOGASTRODUODENOSCOPY) (N/A) CONTROL OF HEMORRHAGE, GI TRACT, ENDOSCOPIC, BY CLIPPING OR OVERSEWING Subjective: Extubated yesterday Answers simple questions, not talkative  Objective: Vital signs in last 24 hours: Temp:  [97.7 F (36.5 C)-100 F (37.8 C)] 98.4 F (36.9 C) (02/01 0821) Pulse Rate:  [83-119] 92 (02/01 0821) Cardiac Rhythm: Normal sinus rhythm (02/01 0730) Resp:  [0-36] 23 (02/01 0821) BP: (180-190)/(57-60) 190/60 (02/01 0444) SpO2:  [89 %-100 %] 100 % (02/01 0821) FiO2 (%):  [35 %-40 %] 35 % (01/31 1438) Weight:  [78.9 kg] 78.9 kg (02/01 0500)  Hemodynamic parameters for last 24 hours: CVP:  [1 mmHg-43 mmHg] 10 mmHg  Intake/Output from previous day: 01/31 0701 - 02/01 0700 In: 3324.4 [I.V.:1950.2; IV Piggyback:1314.2] Out: 3990 [Urine:715; Stool:305; Chest Tube:20] Intake/Output this shift: Total I/O In: 80.9 [I.V.:80.9] Out: 81 [Urine:30]  General appearance: alert, cooperative, and no distress Neurologic: nonfocal Heart: regular rate and rhythm Lungs: coarse BS bilaterally Abdomen: normal findings: soft, non-tender Wound: CT site minimal erythema  Lab Results: Recent Labs    08/28/24 0404 08/29/24 0418  WBC 18.8* 32.9*  HGB 8.9* 9.9*  HCT 26.0* 27.6*  PLT 197 251   BMET:  Recent Labs    08/28/24 1602 08/29/24 0418  NA 133* 133*  K 3.7 3.7  CL 98 99  CO2 22 22  GLUCOSE 178* 161*  BUN 48* 48*  CREATININE 1.26* 1.23*  CALCIUM 8.4* 8.1*    PT/INR: No results for input(s): LABPROT, INR in the last 72 hours. ABG    Component Value Date/Time   PHART 7.297 (L) 08/26/2024 0735   HCO3 19.2 (L) 08/26/2024 0735   TCO2 20 (L) 08/26/2024 0735   ACIDBASEDEF 7.0 (H) 08/26/2024 0735   O2SAT 93 08/26/2024 0735   CBG (last 3)  Recent Labs    08/28/24 2316 08/29/24 0337 08/29/24 0806  GLUCAP 177* 157* 110*    Assessment/Plan: S/P Procedures (LRB): EGD (ESOPHAGOGASTRODUODENOSCOPY)  (N/A) CONTROL OF HEMORRHAGE, GI TRACT, ENDOSCOPIC, BY CLIPPING OR OVERSEWING Remains critically ill with multiple organ system failure NEURO- less animated than normal, no focal deficit, suspect lingering effects from high doses of sedation while on vent  CV- hypertension remains problematic  Continue present Rx  RESP- down to 3L [place], CXr unchanged  ID- on Maxipime , Flagyl , micafungin   Afebrile but WBC up to 32K  Consider line changes, rescan  RENAL- on CRRT with plan to stop tomorrow  ENDO- CBG mildly elevated  Continue insulin   GI- NPO, on TNA for nutrition  Would benefit from PEG/J     LOS: 34 days    [name] 08/29/2024

## 2024-08-29 NOTE — Progress Notes (Signed)
" ° °  [address] [place] [place] [address]             [phone]   Watching TV  BP (!) 190/60   Pulse 85   Temp 98.4 F (36.9 C)   Resp (!) 30   Ht 5' 2.99 (1.6 m)   Wt 78.9 kg   LMP 10/26/1998 Comment: ~IN 40s  SpO2 95%   BMI 30.82 kg/m    Intake/Output Summary (Last 24 hours) at 08/29/2024 1813 Last data filed at 08/29/2024 1700 Gross per 24 hour  Intake 3019.7 ml  Output 3791 ml  Net -771.3 ml   No new issues today  Plan repeat Ct tomorrow after off CRRT if WBC remains elevated  Elspeth C. Kerrin, MD [facility] [phone]  "

## 2024-08-29 NOTE — Progress Notes (Signed)
 "  NAME:  [name], MRN:  [id number], DOB:  [birth year], LOS: 34 ADMISSION DATE:  07/26/2024, CONSULTATION DATE:  1/15 REFERRING MD:  Kerrin, CHIEF COMPLAINT:  sepsis   History of Present Illness:  [name] is a 69 y/o woman with a history of tobacco abuse and lung adenocarcinoma status post RML lobectomy on 12/29.  At the time of surgery was a large cyst that was resected. Despite oversewing it time resection she later developed an esophageal leak.  She required an esophageal stent which she has had recurrent problems with related to positioning/migration of the stent (necessitating multiple EGDs for stent revision). From 1/15 through 1/18, the patient was consistently total body balance positive in setting of receiving NS @ 100 mL/hr and then starting TPN as of 1/18. Due to concomitant AKI, developed volume overload and pulmonary edema and hypertensive urgency ultimately requiring a Cardene  drip be started overnight 1/19. Also on 1/19, she underwent revision of the position of her esophageal stent to address a small esophageal leak.  Pertinent  Medical History  Lung adenocarcinoma Latent  TB, treated in 1980 HTN HLD DM OSA Tobacco abuse  Significant Hospital Events: Including procedures, antibiotic start and stop dates in addition to other pertinent events   12/29 RML lobectomy 1/7 EGD with esophageal stent 1/9 EGD  with stent reposition after migration to stomach 1/15 vanc added, zosyn  restarted. CT  1/19: EGD for stent repositioning due to ongoing small esophageal leak / stent migration 1/20: Gastrograffin study -> no esophageal leak. Started on clear liquid diet. 1/21: Full liquid diet. Diuresed. 1/23 rapid rise in oxygen requirements, back to NPO 1/24 CT with multilobar crazy paving, remained on NRB. Added linzolid 1/25 blood cultures drawn, changed zosyn > cefepime  + flagyl  1/26 intubated overnight 1/27 bronch with BAL, broadened antifungal coverage 1/28 1 unit pRBC, weaned  FiO2, cortrak placed by Dr Daniel 1/30 still awaiting fungal results.  Still with significant positive fluid balance but does not look vascularly overloaded.  Chest x-ray does look a little bit better.  Respiratory compliance looks good.  Heavily sedated, working on titrating sedation down 1/31 Extubated.  Interim History / Subjective:  Patient is doing well postextubation.  She is on 3 L oxygen.  She is resting well.  Her blood pressure is persistently high needing Cleviprex .  She is n.p.o. therefore IV metoprolol  is scheduled.  She already has clonidine  patch.  Her white cell count is up but she has no fevers.  Fungal culture still pending.  Objective    Blood pressure (!) 190/60, pulse 79, temperature 98.4 F (36.9 C), resp. rate (!) 27, height 5' 2.99 (1.6 m), weight 78.9 kg, last menstrual period 10/26/1998, SpO2 96%. CVP:  [0 mmHg-43 mmHg] 5 mmHg  FiO2 (%):  [35 %] 35 %   Intake/Output Summary (Last 24 hours) at 08/29/2024 1214 Last data filed at 08/29/2024 1100 Gross per 24 hour  Intake 3130.57 ml  Output 3554 ml  Net -423.43 ml   Filed Weights   08/27/24 0500 08/28/24 0455 08/29/24 0500  Weight: 85.3 kg 81.4 kg 78.9 kg    Examination  Physical exam: General: Chronically ill-appearing female, lying on the bed.  CRRT ongoing. HEENT: [place]/AT, eyes anicteric.  moist mucus membranes Neuro: Sleeping but wakes up to alert, awake following commands Chest: Coarse breath sounds, no wheezes or rhonchi Heart: Regular rate and rhythm, no murmurs or gallops Abdomen: Soft, nontender, nondistended, bowel sounds present      Resolved problem list   Assessment  and Plan   Esophageal leak- S/p esophageal stenting c/b migration, now s/p repositioning by GI most recently on 1/19 Plan  Continue to monitoring chest tube output  has small bore NGT in place; has not migrated yet, still positioning unchanged so we will hold off on trickle feeds.  Would like to see tip closer to pylorus. NPO and  continue TPN. need to continue to discuss GJ tube with family  Acute respiratory insufficiency with hypoxia, ARDS-differential diagnosis at this point aspiration due to dysphagia and stent related reflux versus another inflammatory source versus COP/post infectious inflammatory condition Pulmonary edema seems unlikely given aggressive diuresis BAL with mixed cell type and low PMNs as well as low eosinophils, RVP was negative Plan Continuing full ventilator support with daily assessment for spontaneous breathing trial Continuing broad-spectrum antibiotics, although all bacterial cultures have been negative thus far had been on several days of fluconazole , this was stopped on the 26th.  Antibiotic coverage was widened on 25th currently on, cefepime , micafungin  and Flagyl .  Has been off Zyvox . Awaiting fungal culture - NGTD in 6 days. If fungal culture is negative, and we are still having trouble with parenchymal lung disease and oxygenation we will consider steroid initiation.   Extubate today to high flow nasal cannula. Repeat CT chest as the patient had leukocytosis.  Also performed abdomen and pelvis.   Lung adenocarcinoma, s/p lobectomy plan chest tube per TCTS  Bronchial defect in BI; confirmed on bronchoscopy Plan Continue chest tube  Hypertensive urgency Plan Continuing clevidipine  for systolic blood pressure less than 150 Will keep clonidine  patch in place Scheduled metoprolol  IV. Continuing scheduled IV hydralazine , hopefully can transition this to via tube once we can use the GI route Volume removal as able If anxious we can try a little bit of Precedex  to help with blood pressure.  Low grade fevers> resolved, but may be masked by CRRT Plan Antibiotics as above  AKI c/b ATN and hypervolemia> now appears euvolemic on exam and renal function is worsening with diuresis Plan CRRT per nephrology -pulling fluid today.  Plans to stop CRRT tomorrow.  Keep Foley -patient is  producing some urine. Renal dose medications Strict intake output  Anemia of critical illness - Received unit of blood on the 29th of obvious bleeding  Plan Trend CBC Trigger for transfusion less than 7 hemoglobin   H/o tobacco abuse plan recommend cessation, previously counseled   Hyperglycemia,  Better control today Plan Continuing Lantus  26 units daily Continue sliding scale insulin  glucose goal 140-180 Holding Jardiance   Deconditioning PlaN eventually will need CIR  Severe protein energy malnutrition Plan con't TPN Tubefeeds as above once tip closer to pyloris not an IR candidate for GJ placement due to overlying colon  40 minutes of critical care time.   Tamela Stakes, MD  Attending Physician, Critical Care Medicine [facility] See Amion for pager For contact information, see Amion. If no response to pager, please call PCCM 2H APP. After hours, 7PM- 7AM, please call on call APP for 2H.   "

## 2024-08-30 ENCOUNTER — Inpatient Hospital Stay (HOSPITAL_COMMUNITY)

## 2024-08-30 DIAGNOSIS — J9601 Acute respiratory failure with hypoxia: Secondary | ICD-10-CM | POA: Diagnosis not present

## 2024-08-30 DIAGNOSIS — K223 Perforation of esophagus: Secondary | ICD-10-CM | POA: Diagnosis not present

## 2024-08-30 DIAGNOSIS — J9809 Other diseases of bronchus, not elsewhere classified: Secondary | ICD-10-CM

## 2024-08-30 DIAGNOSIS — T85591A Other mechanical complication of esophageal anti-reflux device, initial encounter: Secondary | ICD-10-CM

## 2024-08-30 DIAGNOSIS — R6520 Severe sepsis without septic shock: Secondary | ICD-10-CM | POA: Diagnosis not present

## 2024-08-30 DIAGNOSIS — I10 Essential (primary) hypertension: Secondary | ICD-10-CM | POA: Diagnosis not present

## 2024-08-30 DIAGNOSIS — A419 Sepsis, unspecified organism: Secondary | ICD-10-CM | POA: Diagnosis not present

## 2024-08-30 DIAGNOSIS — R739 Hyperglycemia, unspecified: Secondary | ICD-10-CM | POA: Diagnosis not present

## 2024-08-30 DIAGNOSIS — N179 Acute kidney failure, unspecified: Secondary | ICD-10-CM | POA: Diagnosis not present

## 2024-08-30 DIAGNOSIS — D649 Anemia, unspecified: Secondary | ICD-10-CM | POA: Diagnosis not present

## 2024-08-30 DIAGNOSIS — E43 Unspecified severe protein-calorie malnutrition: Secondary | ICD-10-CM | POA: Diagnosis not present

## 2024-08-30 LAB — CBC
HCT: 25.4 % — ABNORMAL LOW (ref 36.0–46.0)
Hemoglobin: 9.5 g/dL — ABNORMAL LOW (ref 12.0–15.0)
MCH: 33.8 pg (ref 26.0–34.0)
MCHC: 37.4 g/dL — ABNORMAL HIGH (ref 30.0–36.0)
MCV: 90.4 fL (ref 80.0–100.0)
Platelets: 207 10*3/uL (ref 150–400)
RBC: 2.81 MIL/uL — ABNORMAL LOW (ref 3.87–5.11)
RDW: 15.5 % (ref 11.5–15.5)
WBC: 37 10*3/uL — ABNORMAL HIGH (ref 4.0–10.5)
nRBC: 0.2 % (ref 0.0–0.2)

## 2024-08-30 LAB — RENAL FUNCTION PANEL
Albumin: 2.7 g/dL — ABNORMAL LOW (ref 3.5–5.0)
Anion gap: 12 (ref 5–15)
BUN: 37 mg/dL — ABNORMAL HIGH (ref 8–23)
CO2: 22 mmol/L (ref 22–32)
Calcium: 8.1 mg/dL — ABNORMAL LOW (ref 8.9–10.3)
Chloride: 99 mmol/L (ref 98–111)
Creatinine, Ser: 0.96 mg/dL (ref 0.44–1.00)
GFR, Estimated: >60 mL/min
Glucose, Bld: 115 mg/dL — ABNORMAL HIGH (ref 70–99)
Phosphorus: 2.8 mg/dL (ref 2.5–4.6)
Potassium: 3.7 mmol/L (ref 3.5–5.1)
Sodium: 133 mmol/L — ABNORMAL LOW (ref 135–145)

## 2024-08-30 LAB — MAGNESIUM: Magnesium: 2.4 mg/dL (ref 1.7–2.4)

## 2024-08-30 LAB — C DIFFICILE (CDIFF) QUICK SCRN (NO PCR REFLEX)
C Diff antigen: NEGATIVE
C Diff interpretation: NOT DETECTED
C Diff toxin: NEGATIVE

## 2024-08-30 LAB — GLUCOSE, CAPILLARY
Glucose-Capillary: 135 mg/dL — ABNORMAL HIGH (ref 70–99)
Glucose-Capillary: 100 mg/dL — ABNORMAL HIGH (ref 70–99)
Glucose-Capillary: 122 mg/dL — ABNORMAL HIGH (ref 70–99)
Glucose-Capillary: 142 mg/dL — ABNORMAL HIGH (ref 70–99)
Glucose-Capillary: 123 mg/dL — ABNORMAL HIGH (ref 70–99)
Glucose-Capillary: 115 mg/dL — ABNORMAL HIGH (ref 70–99)

## 2024-08-30 MED ORDER — VANCOMYCIN 50 MG/ML ORAL SOLUTION
125.0000 mg | Freq: Four times a day (QID) | ORAL | Status: DC
  Administered 2024-08-30 (×2): 125 mg
  Filled 2024-08-30 (×4): qty 2.5

## 2024-08-30 MED ORDER — ACETAMINOPHEN 10 MG/ML IV SOLN
1000.0000 mg | Freq: Four times a day (QID) | INTRAVENOUS | Status: AC
  Administered 2024-08-30 – 2024-08-31 (×4): 1000 mg via INTRAVENOUS
  Filled 2024-08-30 (×4): qty 100

## 2024-08-30 MED ORDER — FUROSEMIDE 10 MG/ML IJ SOLN
80.0000 mg | Freq: Every day | INTRAMUSCULAR | Status: DC
  Administered 2024-08-30 – 2024-09-03 (×5): 80 mg via INTRAVENOUS
  Filled 2024-08-30 (×5): qty 8

## 2024-08-30 MED ORDER — VANCOMYCIN 50 MG/ML ORAL SOLUTION
125.0000 mg | Freq: Four times a day (QID) | ORAL | Status: DC
  Administered 2024-08-30 – 2024-08-31 (×2): 125 mg
  Filled 2024-08-30 (×4): qty 2.5

## 2024-08-30 MED ORDER — TRACE MINERALS CU-MN-SE-ZN 300-55-60-3000 MCG/ML IV SOLN: INTRAVENOUS | Status: DC

## 2024-08-30 MED ORDER — TRACE MINERALS CU-MN-SE-ZN 300-55-60-3000 MCG/ML IV SOLN
INTRAVENOUS | Status: AC
  Filled 2024-08-30: qty 739.2

## 2024-08-30 MED ORDER — METOPROLOL TARTRATE 5 MG/5ML IV SOLN
10.0000 mg | Freq: Four times a day (QID) | INTRAVENOUS | Status: DC
  Administered 2024-08-30 – 2024-08-31 (×4): 10 mg via INTRAVENOUS
  Filled 2024-08-30 (×4): qty 10

## 2024-08-30 MED ORDER — SODIUM CHLORIDE 0.9 % IV SOLN
3.0000 g | Freq: Three times a day (TID) | INTRAVENOUS | Status: DC
  Administered 2024-08-30 – 2024-09-01 (×7): 3 g via INTRAVENOUS
  Filled 2024-08-30 (×7): qty 8

## 2024-08-30 MED ORDER — LABETALOL HCL 5 MG/ML IV SOLN
0.5000 mg/min | Status: DC
  Filled 2024-08-30: qty 80

## 2024-08-30 MED ORDER — ACETAMINOPHEN 10 MG/ML IV SOLN
1000.0000 mg | Freq: Four times a day (QID) | INTRAVENOUS | Status: DC
  Administered 2024-08-31 – 2024-09-01 (×4): 1000 mg via INTRAVENOUS
  Filled 2024-08-30 (×4): qty 100

## 2024-08-30 MED ORDER — PANTOPRAZOLE SODIUM 40 MG IV SOLR
40.0000 mg | Freq: Two times a day (BID) | INTRAVENOUS | Status: AC
  Administered 2024-08-30 – 2024-09-03 (×9): 40 mg via INTRAVENOUS
  Filled 2024-08-30 (×8): qty 10

## 2024-08-30 NOTE — Progress Notes (Signed)
 [name]  Assessment/ Plan: Pt is a 69 y.o. yo female    # Acute kidney Injury: Suspected to have ATN secondary to ischemic and nephrotoxic etiologies.  Serologies negative for GN including vasculitis/anti-GBM given previous concerns of gross hematuria.   -Started CRRT on 1/29 for azotemia.  Stopped CRRT on the morning of 2/1 -Start IV Lasix  80 mg daily -Consider hemodialysis tomorrow -Strict ins and out, monitor labs.  # Esophageal leak following resection of esophageal diverticulum: Underwent placement of a new esophageal stent previous one migrated distally into the stomach.  EGD and esophagram without evidence of leak.   # Hypertensive urgency: Required Cleviprex  IV, monitor BP.  Continue other oral medications.  Consider addition of amlodipine   # Anemia: Requiring blood transfusion.  Continue transfusions as needed per primary team  # Acute hypoxic respiratory failure: No longer intubated.  Management per primary team     Subjective: Patient unable to talk but can communicate with gestures.  Says she is feeling okay.  Clotted off circuit this morning.  Now off CRRT.  Objective Vital signs in last 24 hours: Vitals:   08/30/24 1000 08/30/24 1015 08/30/24 1030 08/30/24 1045  BP:      Pulse: 93 94 91 93  Resp: (!) 21 (!) 26 (!) 24 (!) 21  Temp:      TempSrc:      SpO2: 96% 96% 95% 92%  Weight:      Height:       Weight change: -2 kg  Intake/Output Summary (Last 24 hours) at 08/30/2024 1059 Last data filed at 08/30/2024 1000 Gross per 24 hour  Intake 2910.38 ml  Output 4296 ml  Net -1385.62 ml       Labs: RENAL PANEL Recent Labs  Lab 08/26/24 0334 08/26/24 0735 08/27/24 0513 08/27/24 1537 08/28/24 0404 08/28/24 1602 08/29/24 0418 08/29/24 1510 08/30/24 0448  NA 136   < > 134*  134*   < > 133* 133* 133* 131* 133*  K 4.1   < > 3.7  3.7   < > 4.2 3.7 3.7 4.2 3.7  CL 100   < > 100  100   < > 99 98 99 97* 99  CO2  18*   < > 22  22   < > 22 22 22  21* 22  GLUCOSE 165*   < > 117*  119*   < > 172* 178* 161* 170* 115*  BUN 144*   < > 83*  84*   < > 54* 48* 48* 41* 37*  CREATININE 5.06*   < > 2.47*  2.45*   < > 1.42* 1.26* 1.23* 1.12* 0.96  CALCIUM 8.6*   < > 7.9*  8.0*   < > 7.8* 8.4* 8.1* 8.2* 8.1*  MG 2.2  --  2.2  --  2.3  --  2.5*  --  2.4  PHOS 5.0*   < > 2.9  2.9   < > 3.1 2.3* 2.9 3.3 2.8  ALBUMIN  2.4*   < > 2.6*  2.6*   < > 2.5* 2.9* 2.8* 2.8* 2.7*   < > = values in this interval not displayed.    Liver Function Tests: Recent Labs  Lab 08/27/24 0513 08/27/24 1537 08/28/24 0404 08/28/24 1602 08/29/24 0418 08/29/24 1510 08/30/24 0448  AST 23  --  25  --  49*  --   --   ALT 19  --  20  --  45*  --   --  ALKPHOS 103  --  127*  --  127*  --   --   BILITOT 0.2  --  0.3  --  0.4  --   --   PROT 5.5*  --  5.3*  --  5.7*  --   --   ALBUMIN  2.6*  2.6*   < > 2.5*   < > 2.8* 2.8* 2.7*   < > = values in this interval not displayed.   No results for input(s): LIPASE, AMYLASE in the last 168 hours. No results for input(s): AMMONIA in the last 168 hours. CBC: Recent Labs    08/26/24 1241 08/27/24 0513 08/28/24 0404 08/29/24 0418 08/30/24 0448  HGB 8.4* 9.2* 8.9* 9.9* 9.5*  MCV 91.5 91.3 89.7 87.6 90.4    Cardiac Enzymes: No results for input(s): CKTOTAL, CKMB, CKMBINDEX, TROPONINI in the last 168 hours. CBG: Recent Labs  Lab 08/29/24 1530 08/29/24 1941 08/30/24 0318 08/30/24 0805 08/30/24 1053  GLUCAP 155* 142* 135* 100* 122*    Iron Studies: No results for input(s): IRON, TIBC, TRANSFERRIN, FERRITIN in the last 72 hours. Studies/Results: DG Chest Port 1 View Result Date: 08/30/2024 CLINICAL DATA:  Status post lobectomy. EXAM: PORTABLE CHEST 1 VIEW COMPARISON:  08/29/2024 FINDINGS: Right IJ central line tip overlies the region of the innominate vein confluence. Esophageal stent again noted. Right PICC line tip overlies the proximal to mid SVC level.  Diffuse interstitial opacity in the left lung is similar to prior. Interval decrease in patchy airspace disease seen previously in the right mid and lower lung. Heart size stable. Telemetry leads overlie the chest. No discernible pneumothorax. IMPRESSION: 1. Interval decrease in patchy airspace disease in the right mid and lower lung. 2. No discernible pneumothorax. Electronically Signed   By: Camellia Candle M.D.   On: 08/30/2024 08:10   DG Chest Port 1 View Result Date: 08/29/2024 EXAM: 1 VIEW(S) XRAY OF THE CHEST 08/29/2024 05:35:00 AM COMPARISON: 08/27/2024 CLINICAL HISTORY: Status post lobectomy of lung. ICD-9: 241.489 Acquired absence of lung. FINDINGS: LINES, TUBES AND DEVICES: Right internal jugular central venous catheter in place with tip at [facility]. Right upper extremity PICC (peripherally inserted central catheter) in place with tip at [facility]. Enteric tube in place, extending below diaphragm with tip out of field of view. Esophageal stent in place. LUNGS AND PLEURA: Low lung volumes. Right mid lung postsurgical changes. Persistent bilateral airspace opacities, left greater than right. Small bilateral pleural effusions, unchanged. No pneumothorax. HEART AND MEDIASTINUM: Cardiac leads noted. No acute abnormality of the cardiac and mediastinal silhouettes. BONES AND SOFT TISSUES: No acute osseous abnormality. IMPRESSION: 1. Persistent bilateral interstitial and airspace opacities, left greater than right. 2. Small bilateral pleural effusions, unchanged. Electronically signed by: Waddell Calk MD 08/29/2024 06:23 AM EST RP Workstation: [id number]    Medications: Infusions:  acetaminophen      [START ON 08/31/2024] acetaminophen      ampicillin -sulbactam (UNASYN ) IV     clevidipine  19 mg/hr (08/30/24 1029)   famotidine  (PEPCID ) IV 50 mL/hr at 08/30/24 1000   labetalol  (NORMODYNE ) infusion 5 mg/mL     micafungin  (MYCAMINE ) 100 mg in sodium chloride  0.9 % 100 mL  IVPB 100 mg (08/30/24 1027)   promethazine  (PHENERGAN ) injection (IM or IVPB) Stopped (08/19/24 1554)   TPN ADULT (ION) 35 mL/hr at 08/30/24 1000    Scheduled Medications:  sodium chloride    Intravenous Once   Chlorhexidine  Gluconate Cloth  6 each Topical Daily   [START ON 08/31/2024] cloNIDine   0.3  mg Transdermal Weekly   fentaNYL   1 patch Transdermal Q72H   Gerhardt's butt cream   Topical Daily   hydrALAZINE   20 mg Intravenous Q6H   insulin  aspart  0-20 Units Subcutaneous Q4H   ipratropium-albuterol   3 mL Nebulization BID   lidocaine   2 patch Transdermal Q24H   neomycin -bacitracin -polymyxin   Topical BID   nitroGLYCERIN   0.3 mg Transdermal Daily   sodium chloride  flush  10-40 mL Intracatheter Q12H   vancomycin   125 mg Per Tube QID    have reviewed scheduled and prn medications.  Physical Exam: General: Chronically ill-appearing, lying in bed, no distress Heart: Normal rate, no rub Lungs: Coarse upper airway sounds Abdomen:soft, Non-tender, non-distended Extremities:No edema Neurology: Alert awake and following command Dialysis access: Right IJ temporary HD catheter placed by PCCM on 1/29.  Jefferson Lailany Enoch 08/30/2024,10:59 AM  LOS: 35 days

## 2024-08-30 NOTE — Progress Notes (Signed)
 14 Days Post-Op Procedures (LRB): EGD (ESOPHAGOGASTRODUODENOSCOPY) (N/A) CONTROL OF HEMORRHAGE, GI TRACT, ENDOSCOPIC, BY CLIPPING OR OVERSEWING Subjective: Denies pain at present but requesting PCA  Objective: Vital signs in last 24 hours: Temp:  [95.2 F (35.1 C)-98.8 F (37.1 C)] 97.9 F (36.6 C) (02/02 0655) Pulse Rate:  [76-101] 85 (02/02 0655) Cardiac Rhythm: Normal sinus rhythm (02/02 0500) Resp:  [17-38] 34 (02/02 0655) BP: (167-171)/(50-56) 171/56 (02/02 0404) SpO2:  [90 %-100 %] 95 % (02/02 0655) Weight:  [76.9 kg] 76.9 kg (02/02 0449)  Hemodynamic parameters for last 24 hours: CVP:  [0 mmHg-12 mmHg] 0 mmHg  Intake/Output from previous day: 02/01 0701 - 02/02 0700 In: 2962.6 [I.V.:1948; IV Piggyback:984.6] Out: 4537 [Urine:555; Stool:50; Chest Tube:20] Intake/Output this shift: No intake/output data recorded.  General appearance: alert, cooperative, and no distress Neurologic: intact Heart: regular rate and rhythm Lungs: clear to auscultation bilaterally Abdomen: normal findings: soft, non-tender Wound: clean  Lab Results: Recent Labs    08/29/24 0418 08/30/24 0448  WBC 32.9* 37.0*  HGB 9.9* 9.5*  HCT 27.6* 25.4*  PLT 251 207   BMET:  Recent Labs    08/29/24 1510 08/30/24 0448  NA 131* 133*  K 4.2 3.7  CL 97* 99  CO2 21* 22  GLUCOSE 170* 115*  BUN 41* 37*  CREATININE 1.12* 0.96  CALCIUM 8.2* 8.1*    PT/INR: No results for input(s): LABPROT, INR in the last 72 hours. ABG    Component Value Date/Time   PHART 7.297 (L) 08/26/2024 0735   HCO3 19.2 (L) 08/26/2024 0735   TCO2 20 (L) 08/26/2024 0735   ACIDBASEDEF 7.0 (H) 08/26/2024 0735   O2SAT 93 08/26/2024 0735   CBG (last 3)  Recent Labs    08/29/24 1530 08/29/24 1941 08/30/24 0318  GLUCAP 155* 142* 135*    Assessment/Plan: S/P Procedures (LRB): EGD (ESOPHAGOGASTRODUODENOSCOPY) (N/A) CONTROL OF HEMORRHAGE, GI TRACT, ENDOSCOPIC, BY CLIPPING OR OVERSEWING Remains critically  ill Neuro- intact CV- in SR, BP remains elevated  CVP 0 RESP_ VDRF- resolved  On RA with good sats  CXR left lung stable, right lung improved ID- afebrile but WBC continues to climb now 37K  On cefepime , metronidazole  and micafungin   Diarrhea raises concern for C diff-  RENAL-has been on CRRT  CVP 0, creatinine 0.96  Does not need to continue CRRT, has BP to tlerate intermittent HD if needed GI- diarrhea- concerning for C diff given broad spectrum antibiotics  PO Vanco SCD for DVT prophylaxis, heparin  in CRRT circuit Deconditioning - severe   LOS: 35 days    [name] 08/30/2024

## 2024-08-30 NOTE — Progress Notes (Signed)
 Occupational Therapy Re-Evaluation and Treatment Patient Details Name: [name] MRN: [id number] DOB: Jul 08, 1956 Today's Date: 08/30/2024   History of present illness The pt is a 69 yo female presenting 07/26/24 for Right middle lobectomy to remove non-small cell carcinoma and resection of esophogeal diverticulum. Returned to OR 08/04/24 for EGD with esophageal stent, s/p esophageal perforation. 08/06/24 return to OR for replacement of stent due to migration into stomach. 1/16 bronchoscopy, and bronchoalveolar lavage, replacement of new esophagus stent with clipping, 08/16/24 upper GI endoscopy. New AKI. Pt desaturating requiring NRB on 1/24. Intubated 1/26-1/31. EFY:unajrrn abuse, positive tuberculosis test as a young woman, hypertension, hyperlipidemia, heart murmur, DM II, and anxiety.   OT comments  Pt re-evaluated s/p re-intubation and now extubated. Pt lethargic throughout session; RN reports sedatives could still be in system but not actively on any. Pt limited by attention, initiation, awareness, strength. Pt seen at bed level with bed moved into chair position to optimize functional participation. Pt noted to fatigue easily and needing up to mod-max cues for sustaining and attending to BADL and UE therapeutic exercise. Will continue to follow. Recommendation of inpatient rehab >3 hours/day remains appropriate.       If plan is discharge home, recommend the following:  A lot of help with walking and/or transfers;Two people to help with walking and/or transfers;A little help with bathing/dressing/bathroom;Assistance with cooking/housework;Assist for transportation;Help with stairs or ramp for entrance   Equipment Recommendations  Other (comment) (defer)    Recommendations for Other Services      Precautions / Restrictions Precautions Precautions: Other (comment) Recall of Precautions/Restrictions: Impaired Precaution/Restrictions Comments: chest tube, TPN, HHFNC (reduced to Trion at end of  session by RT), flexiseal, foley, CRRT Restrictions Weight Bearing Restrictions Per Provider Order: No       Mobility Bed Mobility Overal bed mobility: Needs Assistance             General bed mobility comments: totalA with chair position in bed, dependent on posterior support for trunk. able to hold head up without support and reposition. BP elevated but stable.    Transfers                   General transfer comment: deferred due to lethargy and limited assistance     Balance Overall balance assessment: Needs assistance   Sitting balance-Leahy Scale: Zero Sitting balance - Comments: dependent on trunk support Postural control: Posterior lean                                 ADL either performed or assessed with clinical judgement   ADL Overall ADL's : Needs assistance/impaired     Grooming: Minimal assistance;Wash/dry face Grooming Details (indicate cue type and reason): min A with bed in chair position with multimodal cues for initiation                                    Extremity/Trunk Assessment Upper Extremity Assessment Upper Extremity Assessment: Generalized weakness (LUE noted to be edematous; repositioned at end of session on pillows)   Lower Extremity Assessment Lower Extremity Assessment: Defer to PT evaluation        Vision       Perception     Praxis     Communication Communication Communication: Impaired Factors Affecting Communication: Reduced clarity of speech;Difficulty expressing self;Other (comment) (minimal attempts at  verbalizations)   Cognition Arousal: Lethargic Behavior During Therapy: Flat affect                                 Following commands: Impaired Following commands impaired: Follows one step commands inconsistently, Follows one step commands with increased time      Cueing   Cueing Techniques: Verbal cues, Gestural cues, Tactile cues  Exercises Exercises:  General Upper Extremity, General Lower Extremity General Exercises - Upper Extremity Shoulder Flexion: PROM, Both, 5 reps Shoulder ABduction: PROM, Both, 5 reps Elbow Flexion: PROM, Both, 5 reps Elbow Extension: PROM, Both, 5 reps Wrist Flexion: PROM, Both, 5 reps Wrist Extension: PROM, Both, 5 reps Digit Composite Flexion: PROM, AAROM, 10 reps Composite Extension: PROM, AAROM, Both, 10 reps General Exercises - Lower Extremity Ankle Circles/Pumps: AAROM, PROM, 10 reps, Both Short Arc Quad: PROM, Both, 5 reps Heel Slides: PROM, Both, 5 reps Hip ABduction/ADduction: PROM, AAROM, Both, 5 reps    Shoulder Instructions       General Comments      Pertinent Vitals/ Pain       Pain Assessment Pain Assessment: Faces Faces Pain Scale: Hurts even more Pain Location: pt initially stated her back, then grimacing with UE movement but did not answer regarding any other pain Pain Descriptors / Indicators: Discomfort, Grimacing, Moaning  Home Living                                          Prior Functioning/Environment              Frequency  Min 2X/week        Progress Toward Goals  OT Goals(current goals can now be found in the care plan section)  Progress towards OT goals: Goals updated  Acute Rehab OT Goals Patient Stated Goal: get better OT Goal Formulation: With patient Time For Goal Achievement: 09/06/24 Potential to Achieve Goals: Good ADL Goals Pt Will Perform Grooming: with min assist;standing Pt Will Transfer to Toilet: with min assist;ambulating Additional ADL Goal #1: pt will perform 2+ mins OOB ADL with VSS  Plan      Co-evaluation                 AM-PAC OT 6 Clicks Daily Activity     Outcome Measure   Help from another person eating meals?: Total Help from another person taking care of personal grooming?: A Lot Help from another person toileting, which includes using toliet, bedpan, or urinal?: Total Help from another  person bathing (including washing, rinsing, drying)?: A Lot Help from another person to put on and taking off regular upper body clothing?: A Lot Help from another person to put on and taking off regular lower body clothing?: A Lot 6 Click Score: 10    End of Session    OT Visit Diagnosis: Unsteadiness on feet (R26.81);Muscle weakness (generalized) (M62.81);Other symptoms and signs involving cognitive function   Activity Tolerance Patient limited by fatigue;Patient limited by lethargy   Patient Left in bed;with call bell/phone within reach;with bed alarm set   Nurse Communication Mobility status        Time: 8843-8779 OT Time Calculation (min): 24 min  Charges: OT General Charges $OT Visit: 1 Visit OT Evaluation $OT Re-eval: 1 Re-eval  Elma JONETTA Lebron FREDERICK, OTR/L [facility] Acute Rehabilitation Office: [phone]  Asami Lambright D Walton 08/30/2024, 1:45 PM

## 2024-08-30 NOTE — Progress Notes (Addendum)
 "  NAME:  [name], MRN:  [id number], DOB:  09-27-1955, LOS: 35 ADMISSION DATE:  07/26/2024, CONSULTATION DATE:  1/15 REFERRING MD:  [name], CHIEF COMPLAINT:  sepsis   History of Present Illness:  [name] is a 69 y/o woman with a history of tobacco abuse and lung adenocarcinoma status post RML lobectomy on 12/29.  At the time of surgery was a large cyst that was resected. Despite oversewing it time resection she later developed an esophageal leak.  She required an esophageal stent which she has had recurrent problems with related to positioning/migration of the stent (necessitating multiple EGDs for stent revision). From 1/15 through 1/18, the patient was consistently total body balance positive in setting of receiving NS @ 100 mL/hr and then starting TPN as of 1/18. Due to concomitant AKI, developed volume overload and pulmonary edema and hypertensive urgency ultimately requiring a Cardene  drip be started overnight 1/19. Also on 1/19, she underwent revision of the position of her esophageal stent to address a small esophageal leak.  Pertinent  Medical History  Lung adenocarcinoma Latent  TB, treated in 1980 HTN HLD DM OSA Tobacco abuse  Significant Hospital Events: Including procedures, antibiotic start and stop dates in addition to other pertinent events   12/29 RML lobectomy 1/7 EGD with esophageal stent 1/9 EGD  with stent reposition after migration to stomach 1/15 vanc added, zosyn  restarted. CT  1/19: EGD for stent repositioning due to ongoing small esophageal leak / stent migration 1/20: Gastrograffin study -> no esophageal leak. Started on clear liquid diet. 1/21: Full liquid diet. Diuresed. 1/23 rapid rise in oxygen requirements, back to NPO 1/24 CT with multilobar crazy paving, remained on NRB. Added linzolid 1/25 blood cultures drawn, changed zosyn > cefepime  + flagyl  1/26 intubated overnight 1/27 bronch with BAL, broadened antifungal coverage 1/28 1 unit pRBC, weaned  FiO2, cortrak placed by [name] 1/30 still awaiting fungal results.  Still with significant positive fluid balance but does not look vascularly overloaded.  Chest x-ray does look a little bit better.  Respiratory compliance looks good.  Heavily sedated, working on titrating sedation down 1/31 Extubated. 2/2 Stop CRRT. CT c/a/p, sent c diff   Interim History / Subjective:  WBC up to 37, afebrile   Objective    Blood pressure (!) 171/56, pulse 91, temperature 97.9 F (36.6 C), resp. rate (!) 24, height 5' 2.99 (1.6 m), weight 76.9 kg, last menstrual period 10/26/1998, SpO2 95%. CVP:  [0 mmHg-10 mmHg] 0 mmHg      Intake/Output Summary (Last 24 hours) at 08/30/2024 1041 Last data filed at 08/30/2024 1000 Gross per 24 hour  Intake 2910.38 ml  Output 4296 ml  Net -1385.62 ml   Filed Weights   08/28/24 0455 08/29/24 0500 08/30/24 0449  Weight: 81.4 kg 78.9 kg 76.9 kg    Examination  Physical exam:  General: chronically and acutely ill middle aged F Psych:flat affect Neuro: AAOx4  HEENT: NCAT pink mm  CV: rr s1s2  Pulm: shallow respirations, diminished bases  Abdomen: soft + bowel sounds. Liquid stool. FMS GU: foley     Resolved problem list  ARDS Assessment and Plan    Esophageal leak s/p esophageal stent c/b migration now s/p repositioning  P -cont TPA   Acute resp failure w hypoxia, improving Lung ca s/p lobectomy  Bronchial defect in BI  Hx tobacco use  P -chest tube per cvts  -IS, mobility, wean O2 as able for goal > 92   Sepsis unclear source -has  been on broad coverage + antifungals w/o identified source -has had diarrhea for a few days, c/f GI  P -CT c/a/p -send Cdiff -PO vanc started this morning. ID rec to cont mica, change flagyl  cefepimt to unasyn    HTN P -wean clevi gtt SBP < 150  -cont clonidine  patch + SCH hydral + SCH metop + PRN hydral PRN metop   Acute renal failure P -trial off CRRT 2/2 -follow renal indices, UOP   Anemia  -critical  dz P -follow CBC, transfuse for goal > 7   Hyperglycemia  P -SSI  Deconditioning Severe protein energy malnutrition Plan -cont TPN  -PT, OT   Possible hypoactive ICU delirium -delirium precautions   CRITICAL CARE Performed by: Kelly FORBES Turner   Total critical care time: 42 minutes  Critical care time was exclusive of separately billable procedures and treating other patients. Critical care was necessary to treat or prevent imminent or life-threatening deterioration.  Critical care was time spent personally by me on the following activities: development of treatment plan with patient and/or surrogate as well as nursing, discussions with consultants, evaluation of patient's response to treatment, examination of patient, obtaining history from patient or surrogate, ordering and performing treatments and interventions, ordering and review of laboratory studies, ordering and review of radiographic studies, pulse oximetry and re-evaluation of patient's condition.   Kelly Gave MSN, AGACNP-BC [facility] Amion for pager  08/30/2024, 10:41 AM   "

## 2024-08-30 NOTE — Progress Notes (Signed)
 Nutrition Follow-up  DOCUMENTATION CODES:   Severe malnutrition in context of acute illness/injury  INTERVENTION:   Continue TPN to meet nutritional needs   NUTRITION DIAGNOSIS:   Severe Malnutrition related to acute illness as evidenced by moderate muscle depletion, energy intake < or equal to 50% for > or equal to 5 days.  ***  GOAL:   Patient will meet greater than or equal to 90% of their needs  ***  MONITOR:   Diet advancement, Supplement acceptance, PO intake, Weight trends  REASON FOR ASSESSMENT:   Consult New TPN/TNA  ASSESSMENT:   Pt with PMH significant for: T2DM, HLD, HTN, sleep apnea, anxiety and depression. 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 Now s/p right middle lobectomy and resection of esophageal diverticulum.  12/29 admitted; OR: middle lobectomy and resection of esophageal diverticulum 12/30 esophagram: small contained esophageal leak 12/31 PICC placed; TPN started 1/06 aspirated contrast - esophagram unable to be completed 1/07 esophageal stent placed; TPN at goal 1/08 diet adv CLD  1/09 reports of excessive coughing; esophageal stent found to be in wrong position 1/10 adjustment to esophageal stent 1/12 repeat esophagram; diet advanced to clear liquid diet 1/13 diet advanced DYS1/thins - increased coughing 1/14 NPO; esophageal stent migration 1/15 CT: displaced esophageal stent, small R pleural effusion 1/16 EGD w/ stent replacement, bronch 1/17 TPN restarted 1/20 EGD and Esophogram with no signs of leak, diet advanced to CL 1/21 Diet adv to FL, +nausea 1/22 Diet adv to Regular 1/24 Back to strict NPO, worsening respiratory status, concern for aspiration  1/26 Intubated 1/27 Bronch- airway defect 1/28 Cortrak tube placed by Dr. Daniel using serial xrays (not Cortrak technology), tip gastric 1/30 Reglan   1/31 Extubated 2/01 CRRT discontinued ***  Remains NPO Noted checking C.Diff today,  plan for CT C/A/P  Cortrak remains in place, per abd imaging, tip remains gastric  TPN continues, current rate of 35 ml/hr. Remains on Cleviprex  gtt (   Current Weight: 85.4 kg-weight trending up with decreased UOP Admission Weight: 70.3 kg Estimated Dry Weight: unknown at present; edema noted on exam    Labs:  Meds  NUTRITION - FOCUSED PHYSICAL EXAM:  {RD Focused Exam List:21252}  Diet Order:   Diet Order             Diet NPO time specified  Diet effective now                   EDUCATION NEEDS:   Education needs have been addressed  Skin:  Skin Assessment: Reviewed RN Assessment  Last BM:  1/26 small type 6  Height:   Ht Readings from Last 1 Encounters:  08/27/24 5' 2.99 (1.6 m)    Weight:   Wt Readings from Last 1 Encounters:  08/30/24 76.9 kg    Ideal Body Weight:  52.3 kg  BMI:  Body mass index is 30.04 kg/m.  Estimated Nutritional Needs:   Kcal:  1700-1900  Protein:  100-115g  Fluid:  >1.7L/day   Betsey Finger MS, RDN, LDN, CNSC Registered Dietitian 3 Clinical Nutrition RD Inpatient Contact Info in Amion

## 2024-08-31 ENCOUNTER — Inpatient Hospital Stay (HOSPITAL_COMMUNITY)

## 2024-08-31 DIAGNOSIS — R6520 Severe sepsis without septic shock: Secondary | ICD-10-CM | POA: Diagnosis not present

## 2024-08-31 DIAGNOSIS — J9601 Acute respiratory failure with hypoxia: Secondary | ICD-10-CM | POA: Diagnosis not present

## 2024-08-31 DIAGNOSIS — A419 Sepsis, unspecified organism: Secondary | ICD-10-CM | POA: Diagnosis not present

## 2024-08-31 DIAGNOSIS — N179 Acute kidney failure, unspecified: Secondary | ICD-10-CM | POA: Diagnosis not present

## 2024-08-31 LAB — RENAL FUNCTION PANEL
Albumin: 2.6 g/dL — ABNORMAL LOW (ref 3.5–5.0)
Anion gap: 15 (ref 5–15)
BUN: 71 mg/dL — ABNORMAL HIGH (ref 8–23)
CO2: 19 mmol/L — ABNORMAL LOW (ref 22–32)
Calcium: 7.8 mg/dL — ABNORMAL LOW (ref 8.9–10.3)
Chloride: 100 mmol/L (ref 98–111)
Creatinine, Ser: 1.7 mg/dL — ABNORMAL HIGH (ref 0.44–1.00)
GFR, Estimated: 33 mL/min — ABNORMAL LOW
Glucose, Bld: 150 mg/dL — ABNORMAL HIGH (ref 70–99)
Phosphorus: 4.9 mg/dL — ABNORMAL HIGH (ref 2.5–4.6)
Potassium: 3.3 mmol/L — ABNORMAL LOW (ref 3.5–5.1)
Sodium: 134 mmol/L — ABNORMAL LOW (ref 135–145)

## 2024-08-31 LAB — CBC
HCT: 23.1 % — ABNORMAL LOW (ref 36.0–46.0)
Hemoglobin: 8.1 g/dL — ABNORMAL LOW (ref 12.0–15.0)
MCH: 32.3 pg (ref 26.0–34.0)
MCHC: 35.1 g/dL (ref 30.0–36.0)
MCV: 92 fL (ref 80.0–100.0)
Platelets: 177 10*3/uL (ref 150–400)
RBC: 2.51 MIL/uL — ABNORMAL LOW (ref 3.87–5.11)
RDW: 16.2 % — ABNORMAL HIGH (ref 11.5–15.5)
WBC: 32.6 10*3/uL — ABNORMAL HIGH (ref 4.0–10.5)
nRBC: 0.1 % (ref 0.0–0.2)

## 2024-08-31 LAB — GLUCOSE, CAPILLARY
Glucose-Capillary: 122 mg/dL — ABNORMAL HIGH (ref 70–99)
Glucose-Capillary: 124 mg/dL — ABNORMAL HIGH (ref 70–99)
Glucose-Capillary: 126 mg/dL — ABNORMAL HIGH (ref 70–99)
Glucose-Capillary: 127 mg/dL — ABNORMAL HIGH (ref 70–99)
Glucose-Capillary: 144 mg/dL — ABNORMAL HIGH (ref 70–99)
Glucose-Capillary: 147 mg/dL — ABNORMAL HIGH (ref 70–99)

## 2024-08-31 LAB — MAGNESIUM: Magnesium: 2.1 mg/dL (ref 1.7–2.4)

## 2024-08-31 MED ORDER — TRACE MINERALS CU-MN-SE-ZN 300-55-60-3000 MCG/ML IV SOLN
INTRAVENOUS | Status: AC
  Filled 2024-08-31: qty 448

## 2024-08-31 MED ORDER — IOHEXOL 300 MG/ML  SOLN
100.0000 mL | Freq: Once | INTRAMUSCULAR | Status: AC | PRN
  Administered 2024-08-31: 25 mL via ORAL

## 2024-08-31 MED ORDER — POTASSIUM CHLORIDE 10 MEQ/50ML IV SOLN
10.0000 meq | INTRAVENOUS | Status: AC
  Administered 2024-08-31 (×4): 10 meq via INTRAVENOUS
  Filled 2024-08-31 (×4): qty 50

## 2024-08-31 MED ORDER — LABETALOL HCL 5 MG/ML IV SOLN
0.5000 mg/min | Status: DC
  Administered 2024-08-31: 0.5 mg/min via INTRAVENOUS
  Filled 2024-08-31: qty 80

## 2024-08-31 MED ORDER — CARVEDILOL 6.25 MG PO TABS
6.2500 mg | ORAL_TABLET | Freq: Two times a day (BID) | ORAL | Status: DC
  Administered 2024-08-31 – 2024-09-01 (×2): 6.25 mg via ORAL
  Filled 2024-08-31 (×2): qty 1

## 2024-08-31 NOTE — Progress Notes (Signed)
 TCTS afternoon rounds:  Afebrile, hemodynamically stable in NSR.  Sats 96% RA.  UO good.  Started clear liquids.

## 2024-09-01 ENCOUNTER — Encounter: Payer: Self-pay | Admitting: Thoracic Surgery (Cardiothoracic Vascular Surgery)

## 2024-09-01 ENCOUNTER — Ambulatory Visit (HOSPITAL_BASED_OUTPATIENT_CLINIC_OR_DEPARTMENT_OTHER): Admitting: Pulmonary Disease

## 2024-09-01 DIAGNOSIS — D72829 Elevated white blood cell count, unspecified: Secondary | ICD-10-CM | POA: Diagnosis not present

## 2024-09-01 DIAGNOSIS — E43 Unspecified severe protein-calorie malnutrition: Secondary | ICD-10-CM | POA: Diagnosis not present

## 2024-09-01 DIAGNOSIS — J690 Pneumonitis due to inhalation of food and vomit: Secondary | ICD-10-CM | POA: Diagnosis not present

## 2024-09-01 DIAGNOSIS — A419 Sepsis, unspecified organism: Secondary | ICD-10-CM | POA: Diagnosis not present

## 2024-09-01 DIAGNOSIS — I10 Essential (primary) hypertension: Secondary | ICD-10-CM | POA: Diagnosis not present

## 2024-09-01 DIAGNOSIS — D649 Anemia, unspecified: Secondary | ICD-10-CM | POA: Diagnosis not present

## 2024-09-01 DIAGNOSIS — C342 Malignant neoplasm of middle lobe, bronchus or lung: Secondary | ICD-10-CM | POA: Diagnosis not present

## 2024-09-01 DIAGNOSIS — N179 Acute kidney failure, unspecified: Secondary | ICD-10-CM | POA: Diagnosis not present

## 2024-09-01 LAB — RENAL FUNCTION PANEL
Albumin: 2.6 g/dL — ABNORMAL LOW (ref 3.5–5.0)
Anion gap: 18 — ABNORMAL HIGH (ref 5–15)
BUN: 96 mg/dL — ABNORMAL HIGH (ref 8–23)
CO2: 17 mmol/L — ABNORMAL LOW (ref 22–32)
Calcium: 8 mg/dL — ABNORMAL LOW (ref 8.9–10.3)
Chloride: 97 mmol/L — ABNORMAL LOW (ref 98–111)
Creatinine, Ser: 2.22 mg/dL — ABNORMAL HIGH (ref 0.44–1.00)
GFR, Estimated: 24 mL/min — ABNORMAL LOW
Glucose, Bld: 131 mg/dL — ABNORMAL HIGH (ref 70–99)
Phosphorus: 7.2 mg/dL — ABNORMAL HIGH (ref 2.5–4.6)
Potassium: 3.8 mmol/L (ref 3.5–5.1)
Sodium: 132 mmol/L — ABNORMAL LOW (ref 135–145)

## 2024-09-01 LAB — GLUCOSE, CAPILLARY
Glucose-Capillary: 155 mg/dL — ABNORMAL HIGH (ref 70–99)
Glucose-Capillary: 116 mg/dL — ABNORMAL HIGH (ref 70–99)
Glucose-Capillary: 215 mg/dL — ABNORMAL HIGH (ref 70–99)
Glucose-Capillary: 130 mg/dL — ABNORMAL HIGH (ref 70–99)
Glucose-Capillary: 114 mg/dL — ABNORMAL HIGH (ref 70–99)
Glucose-Capillary: 140 mg/dL — ABNORMAL HIGH (ref 70–99)
Glucose-Capillary: 118 mg/dL — ABNORMAL HIGH (ref 70–99)

## 2024-09-01 LAB — CBC
HCT: 21 % — ABNORMAL LOW (ref 36.0–46.0)
Hemoglobin: 8.1 g/dL — ABNORMAL LOW (ref 12.0–15.0)
MCH: 35.4 pg — ABNORMAL HIGH (ref 26.0–34.0)
MCHC: 38.6 g/dL — ABNORMAL HIGH (ref 30.0–36.0)
MCV: 91.7 fL (ref 80.0–100.0)
Platelets: 163 10*3/uL (ref 150–400)
RBC: 2.29 MIL/uL — ABNORMAL LOW (ref 3.87–5.11)
RDW: 16.3 % — ABNORMAL HIGH (ref 11.5–15.5)
WBC: 26.6 10*3/uL — ABNORMAL HIGH (ref 4.0–10.5)
nRBC: 0.1 % (ref 0.0–0.2)

## 2024-09-01 LAB — MAGNESIUM: Magnesium: 2 mg/dL (ref 1.7–2.4)

## 2024-09-01 MED ORDER — SODIUM CHLORIDE 0.9 % IV SOLN
3.0000 g | Freq: Two times a day (BID) | INTRAVENOUS | Status: DC
  Administered 2024-09-01 – 2024-09-02 (×3): 3 g via INTRAVENOUS
  Filled 2024-09-01 (×3): qty 8

## 2024-09-01 MED ORDER — TRACE MINERALS CU-MN-SE-ZN 300-55-60-3000 MCG/ML IV SOLN
INTRAVENOUS | Status: AC
  Filled 2024-09-01: qty 448

## 2024-09-01 MED ORDER — CARVEDILOL 25 MG PO TABS
25.0000 mg | ORAL_TABLET | Freq: Two times a day (BID) | ORAL | Status: AC
  Administered 2024-09-01 – 2024-09-03 (×5): 25 mg via ORAL
  Filled 2024-09-01 (×5): qty 1

## 2024-09-01 MED ORDER — CARVEDILOL 12.5 MG PO TABS: 12.5000 mg | ORAL_TABLET | Freq: Two times a day (BID) | ORAL | Status: DC

## 2024-09-01 MED ORDER — OXYCODONE HCL 5 MG PO TABS
5.0000 mg | ORAL_TABLET | ORAL | Status: AC | PRN
  Administered 2024-09-01 (×3): 10 mg via ORAL
  Filled 2024-09-01 (×3): qty 2

## 2024-09-01 MED ORDER — HYDRALAZINE HCL 50 MG PO TABS
50.0000 mg | ORAL_TABLET | Freq: Three times a day (TID) | ORAL | Status: DC
  Administered 2024-09-01 – 2024-09-02 (×3): 50 mg via ORAL
  Filled 2024-09-01 (×3): qty 1

## 2024-09-01 MED ORDER — AMLODIPINE BESYLATE 5 MG PO TABS
5.0000 mg | ORAL_TABLET | Freq: Every day | ORAL | Status: DC
  Administered 2024-09-01: 5 mg via ORAL
  Filled 2024-09-01 (×2): qty 1

## 2024-09-01 MED ORDER — ACETAMINOPHEN 160 MG/5ML PO SOLN
650.0000 mg | Freq: Four times a day (QID) | ORAL | Status: AC | PRN
  Filled 2024-09-01: qty 20.3

## 2024-09-01 NOTE — Progress Notes (Addendum)
 PHARMACY - TOTAL PARENTERAL NUTRITION CONSULT NOTE  Indication: Esophageal leak  Patient Measurements: Height: 5' 2.99 (160 cm) Weight: 81.1 kg (178 lb 12.7 oz) IBW/kg (Calculated) : 52.38 TPN AdjBW (KG): 55.8 Body mass index is 31.68 kg/m.  Assessment:  78 YOF with NSCLC here for RML lobectomy, resection of esophageal diverticulum, and lymph node dissection on 12/29.  Found to have a small contained leak post-op.   Patient reports an intentional weight loss of 40 lbs over a couple of years when she was diagnosed with DM.  She typically drinks one Orgain shake every morning for breakfast and eats one meal a day, which could be pizza, spaghetti, chicken, beef or fish along with vegetables and fruits.  Patient was eating normally until her planned surgery. RD concerned with patient being malnourished. Patient was on TPN 12/31 - 1/12 and was advanced to dysphagia 1 diet. However, she was NPO again 1/14-1/16. On 1/17 IR reported small amount of leak from distal esophageal stent. Patient developed AKI since TPN was held. Pharmacy reconsulted for TPN 1/17.  Patient intubated overnight on 1/27. Currently on propofol  and cleviprex . Trial of nicardipine  on 1/26 without adequate BP control. Requiring propofol  for sedation, potential extubation on 1/30 so team would like to avoid versed . Precedex  likely with inadequate effect for full wean. Potential for Jtube / PEG at the end of the week per TCTS.   1/30: CRRT started yesterday 1/29, glc control improving. TG noted.  1/31: TG down after propofol  stopped >> changed to precedex , clevi down to 2 mg/hr, reglan  stopped 1/30  2/3 Off CRRT, diuresing w/ K supplementation. Remains on clevidipine  at high dose.  2/4 Patient able to swallow and started on clear liquids yesterday. Coreg  started yesterday but remains on high doses of clevidipine .   Glucose / Insulin : hx DM, A1c 5.9% - BG 116-155, 15 units SSI/24h (no glargine), insulin  removed from TPN on 1/27  (previously 20u, was stopped when TPN was adjusted for cleviprex )   Electrolytes: Na 132, K 3.8, CO2 17, Phos 7.2 (none in TPN, was removed 2/3), CoCa: 9.1 Renal: AKI, CRRT stopped 2/2 AM. Considering iHD but currently diuresing and may have renal recovery. SCr 2.22 (BL 0.8), BUN 96 Hepatic: alk phos 127, AST49, ALT 45, tbili WNL, albumin  2.7, TG 213 1/31 (noted clevidipine  added on 1/26)  Intake / Output; MIVF: UOP 0.69 mL/kg/hr (on lasix  80 daily), LBM 2/3 diarrhea, chest tube 60 mL Net IO Since Admission: 22,863.13 mL [09/01/24 0654]  GI Imaging:   12/30 esophagram: Positive for contrast extravasation and an esophageal leak in the mid esophagus 1/2 esophagram: Persistent contrast extravasation and esophageal leak, similar to prior 1/6 esophagram: Limited contrast study due to patient aspiration and severe coughing spell. Study aborted. 1/12 esophagram: Patent esophageal stent. No evidence for extravasation or leak, Delayed esophageal emptying and gastroesophageal reflux leading to stasis of contrast in the esophagus. 1/15 CT: displaced esophageal stent in stomach, no bowel obstruction  1/17 esophagram: small volume esophageal leak at distal stent 1/20 esophagram: no evidence of any leak  1/28 KUB: Nonobstructive bowel gas pattern  1/29 KUB: bowel gas pattern is normal 1/30 KUB: Nonobstructive bowel gas pattern 2/3 esophagram no leak, mild esophageal dysmotility, stent in place  GI Surgeries / Procedures:  12/30 right middle lobectomy, resection esophageal diverticulum, lymph node biopsy  1/7 EGD- stent placement  1/9 EGD stent repositioned d/t migration to stomach  1/16 EGD- stent placed at esophageal ulcer, removed gastric stent, normal duoenum  1/19 EGD -  stent replaced / repositioned   Central access: PICC 07/28/24 TPN start date: 07/28/24 > 1/12; 1/17 >>   Nutritional Goals:  Goal unconcentrated TPN rate 75 ml/hr to provide 101g AA and 1721 kCal Goal concentrated TPN rate 60  mL/hr to provide 101 g AA and 1743 kCal  Temporary goal off CRRT while on high dose clevidipine  will be 35 ml/hr to provide ~ 697 kcal and 1.2 g/kg AA (67.2 g), and 126 g dextrose . This provides 1847 kcal/day. Hopeful to wean clevidipine  off, and increase dextrose  / kcal provision from TPN soon.  RD Estimated Needs Total Energy Estimated Needs: 1700-1900 Total Protein Estimated Needs: 85-105 g Total Fluid Estimated Needs: >1.7L/day  Current Nutrition:  TPN 1/17 >> 1/08 CLD - passed swallow 1/09 CLD - patient feels can't swallow well > NPO 1/13 advanced to DYS1 1/14 NPO  1/21 FLD, Glucerna x1 on 1/21, milkshake per patient  1/24 NPO  1/31 ~05:00 TPN pump alerting for air-in-line though RN could not find any issue with it. Switched to D10 for 12 hours  2/3 Clear liquids started   Plan: Continue TPN without lipids to 35 ml/hr, provides 67 g protein and 697 kcals, meeting % goals with cleviprex    Cleviprex  infusing at 2-42 mL/hr at 2 kcal/ml, = ~ 1147 kcal from cleviprex  in last 24 hours  Propofol  discontinued at 13:13 on 1/30.  Electrolytes in TPN: Na 100 mEq/L, K 10 mEq/L, Ca 5 mEq/L, Mg 4 mEq/L, Phos 0 mmol/L. Cl:Ac max acetate (changed to max acetate on 2/3)  Add standard MVI and trace elements to TPN Continue resistant SSI to q4h and adjust as needed As discussed with CCM, stopped Insulin  glargine 26 units on 2/1 as TPN decreased with minimal dextrose  while on Cleviprex . Add 8 units of insulin  to TPN now that increasing dextrose  in TPN  Monitor TPN labs on Mon/Thurs, and daily until stable  Thank you for allowing pharmacy to be a part of this patients care.  Rankin Sams, PharmD, BCCCP Clinical Pharmacist

## 2024-09-01 NOTE — Progress Notes (Signed)
 [name] NEPHROLOGY PROGRESS NOTE  Assessment/ Plan: Pt is a 69 y.o. yo female    # Acute kidney Injury: Suspected to have ATN secondary to ischemic and nephrotoxic etiologies.  Serologies negative for GN including vasculitis/anti-GBM given previous concerns of gross hematuria.   -Started CRRT on 1/29 for azotemia.  Stopped CRRT on the morning of 2/1 - Continue IV Lasix  80 mg daily -HD today given rapidly rising BUN -Reassess HD needs daily given good UOP and signs of potential recovery -Strict ins and out, monitor labs.  # Esophageal leak following resection of esophageal diverticulum: Underwent placement of a new esophageal stent previous one migrated distally into the stomach.  EGD and esophagram without evidence of leak.   # Hypertensive urgency: Required Cleviprex  IV, monitor BP.  Continue other oral medications.  Consider addition of amlodipine   # Anemia: Requiring blood transfusion.  Continue transfusions as needed per primary team  # Acute hypoxic respiratory failure: No longer intubated.  Much improved     Subjective: Minimally conversant today. Shakes her head no when I mention dialysis but we discuss why this is important. No other complaints.  Objective Vital signs in last 24 hours: Vitals:   09/01/24 0915 09/01/24 0930 09/01/24 0945 09/01/24 1000  BP:      Pulse: 79 78 78 80  Resp: 19 18 16  (!) 25  Temp: (!) 97.5 F (36.4 C) (!) 97.5 F (36.4 C) (!) 97.5 F (36.4 C) (!) 97.5 F (36.4 C)  TempSrc:      SpO2: 91% 93% 93% 91%  Weight:      Height:       Weight change: 4.9 kg  Intake/Output Summary (Last 24 hours) at 09/01/2024 1033 Last data filed at 09/01/2024 1000 Gross per 24 hour  Intake 3701.11 ml  Output 1515 ml  Net 2186.11 ml       Labs: RENAL PANEL Recent Labs  Lab 08/28/24 0404 08/28/24 1602 08/29/24 0418 08/29/24 1510 08/30/24 0448 08/31/24 0424 08/31/24 0500 09/01/24 0356  NA 133*   < > 133* 131* 133* 134*  --   132*  K 4.2   < > 3.7 4.2 3.7 3.3*  --  3.8  CL 99   < > 99 97* 99 100  --  97*  CO2 22   < > 22 21* 22 19*  --  17*  GLUCOSE 172*   < > 161* 170* 115* 150*  --  131*  BUN 54*   < > 48* 41* 37* 71*  --  96*  CREATININE 1.42*   < > 1.23* 1.12* 0.96 1.70*  --  2.22*  CALCIUM 7.8*   < > 8.1* 8.2* 8.1* 7.8*  --  8.0*  MG 2.3  --  2.5*  --  2.4  --  2.1 2.0  PHOS 3.1   < > 2.9 3.3 2.8 4.9*  --  7.2*  ALBUMIN  2.5*   < > 2.8* 2.8* 2.7* 2.6*  --  2.6*   < > = values in this interval not displayed.    Liver Function Tests: Recent Labs  Lab 08/27/24 0513 08/27/24 1537 08/28/24 0404 08/28/24 1602 08/29/24 0418 08/29/24 1510 08/30/24 0448 08/31/24 0424 09/01/24 0356  AST 23  --  25  --  49*  --   --   --   --   ALT 19  --  20  --  45*  --   --   --   --   CICERO  103  --  127*  --  127*  --   --   --   --   BILITOT 0.2  --  0.3  --  0.4  --   --   --   --   PROT 5.5*  --  5.3*  --  5.7*  --   --   --   --   ALBUMIN  2.6*  2.6*   < > 2.5*   < > 2.8*   < > 2.7* 2.6* 2.6*   < > = values in this interval not displayed.   No results for input(s): LIPASE, AMYLASE in the last 168 hours. No results for input(s): AMMONIA in the last 168 hours. CBC: Recent Labs    08/27/24 0513 08/28/24 0404 08/29/24 0418 08/30/24 0448 08/31/24 1010  HGB 9.2* 8.9* 9.9* 9.5* 8.1*  MCV 91.3 89.7 87.6 90.4 92.0    Cardiac Enzymes: No results for input(s): CKTOTAL, CKMB, CKMBINDEX, TROPONINI in the last 168 hours. CBG: Recent Labs  Lab 08/31/24 1925 08/31/24 1954 08/31/24 2337 09/01/24 0334 09/01/24 0800  GLUCAP 147* 155* 127* 116* 215*    Iron Studies: No results for input(s): IRON, TIBC, TRANSFERRIN, FERRITIN in the last 72 hours. Studies/Results: DG ESOPHAGUS W SINGLE CM (SOL OR THIN BA) Result Date: 08/31/2024 CLINICAL DATA:  69 year old female with history of a right middle lobe lobectomy and midthoracic esophageal diverticular resection on 07/26/24 complicated by  persistent esophageal perforation. Patient is status post esophageal stent placement on 08/04/24 which migrated into the stomach. A new stent was placed on 08/13/24 with old migrated stent removed at that time. Subsequent esophagram on 08/17/24 demonstrated no leak. Received requested for repeat esophagram to check for leak. EXAM: ESOPHAGUS/BARIUM SWALLOW STUDY TECHNIQUE: Single contrast examination was performed using Omnipaque  300. This exam was performed by Rayfield Buff, NP, and was supervised and interpreted by Dr. Rockey Kilts. FLUOROSCOPY: Radiation Exposure Index (as provided by the fluoroscopic device): 20.1 mGy Kerma COMPARISON:  CT Chest Abdomen Pelvis 08/30/2024, DG esophagram 08/17/2024 FINDINGS: Esophagus: Upper thoracic esophageal stent is similar in position. Small volume contrast does track around the outside of the stent, greater left than right. No contrast extravasation outside of the esophageal lumen. Esophageal motility: Mild dysmotility with stent in place. Hiatal Hernia: None. Other: None. IMPRESSION: No evidence of esophageal leak. Electronically Signed   By: Rockey Kilts M.D.   On: 08/31/2024 13:48   CT CHEST ABDOMEN PELVIS WO CONTRAST Result Date: 08/30/2024 EXAM: CT CHEST, ABDOMEN AND PELVIS WITHOUT CONTRAST 08/30/2024 03:20:00 PM TECHNIQUE: CT of the chest, abdomen and pelvis was performed without the administration of intravenous contrast. Multiplanar reformatted images are provided for review. Automated exposure control, iterative reconstruction, and/or weight based adjustment of the mA/kV was utilized to reduce the radiation dose to as low as reasonably achievable. COMPARISON: Comparison with same day chest x-ray and CT 08/12/2024 and CT chest 08/21/2024. CLINICAL HISTORY: Sepsis; Follow up on bilateral pneumonia and airspace disease. Intraabdominal source of infection. FINDINGS: CHEST: MEDIASTINUM AND LYMPH NODES: Right IJ CVC tip in the mid SVC. Esophageal stent. Similar mild  circumferential wall thickening of the distal esophagus. Trace periesophageal free fluid. Small pocket of air in the mediastinum communicating with the posterior bronchus intermedius on series 5 image 59 is similar to 08/12/2024. No pathologic mediastinal or hilar lymphadenopathy noting decreased sensitivity of noncontrast exam. Heart and pericardium are unremarkable. LUNGS AND PLEURA: Postoperative change of right middle lobectomy. Right chest tube located along the posterior  and medial right lung. Small right pleural effusion with thin high density rim is similar. Decreased small left pleural effusion compared to 08/21/2024. Interval improvement in ground glass opacities and crazy paving pattern compared to 08/21/2024. There is ongoing ground glass and interlobular septal thickening in both lungs greatest in the left upper lobe. No pneumothorax. ABDOMEN AND PELVIS: LIVER: Unremarkable. GALLBLADDER AND BILE DUCTS: Distended gallbladder. No biliary dilation or radiopaque stone. SPLEEN: No acute abnormality. PANCREAS: No acute abnormality. There is trace peripancreatic stranding felt to be secondary to severe duodenitis. ADRENAL GLANDS: No acute abnormality. KIDNEYS, URETERS AND BLADDER: Bilateral punctate nonobstructing nephrolithiasis. No stones in the ureters. No hydronephrosis. No perinephric or periureteral stranding. Foley catheter in the decompressed bladder. GI AND BOWEL: Wall thickening of the stomach and severe wall thickening of the duodenum. Adjacent periduodenal fat stranding. Rectal tube. There is no bowel obstruction. REPRODUCTIVE ORGANS: No acute abnormality. PERITONEUM AND RETROPERITONEUM: Small volume abdominal pelvic ascites, increased from 08/12/2024. No drainable abscess or evidence of perforation. VASCULATURE: Aorta is normal in caliber. ABDOMINAL AND PELVIS LYMPH NODES: No lymphadenopathy. BONES AND SOFT TISSUES: No acute osseous abnormality. No focal soft tissue abnormality. IMPRESSION: 1.  Severe infectious or inflammatory duodenitis. Ischemia is not excluded. 2. Improved but ongoing widespread infectious / inflammatory change in the lungs. 3. Gastric wall thickening compatible with gastritis. 4. Similar wall thickening of the lower esophagus . 5. Small volume abdominal pelvic ascites, increased from 08/12/24. No abscess or free air. 6. Small pocket of mediastinal air communicating with the posterior bronchus intermedius is similar to 08/12/24. Electronically signed by: Norman Gatlin MD 08/30/2024 05:28 PM EST RP Workstation: [id number]    Medications: Infusions:  ampicillin -sulbactam (UNASYN ) IV Stopped (09/01/24 0422)   clevidipine  21 mg/hr (09/01/24 1000)   labetalol  (NORMODYNE ) infusion 5 mg/mL Stopped (08/31/24 1540)   micafungin  (MYCAMINE ) 100 mg in sodium chloride  0.9 % 100 mL IVPB 100 mg (09/01/24 1011)   promethazine  (PHENERGAN ) injection (IM or IVPB) Stopped (08/31/24 0008)   TPN ADULT (ION) 35 mL/hr at 09/01/24 1000   TPN ADULT (ION)      Scheduled Medications:  sodium chloride    Intravenous Once   amLODipine   5 mg Oral Daily   carvedilol   12.5 mg Oral BID WC   Chlorhexidine  Gluconate Cloth  6 each Topical Daily   cloNIDine   0.3 mg Transdermal Weekly   fentaNYL   1 patch Transdermal Q72H   furosemide   80 mg Intravenous Daily   Gerhardt's butt cream   Topical Daily   hydrALAZINE   20 mg Intravenous Q6H   insulin  aspart  0-20 Units Subcutaneous Q4H   ipratropium-albuterol   3 mL Nebulization BID   lidocaine   2 patch Transdermal Q24H   neomycin -bacitracin -polymyxin   Topical BID   nitroGLYCERIN   0.3 mg Transdermal Daily   pantoprazole  (PROTONIX ) IV  40 mg Intravenous Q12H   sodium chloride  flush  10-40 mL Intracatheter Q12H    have reviewed scheduled and prn medications.  Physical Exam: General: Chronically ill-appearing, lying in bed, no distress Heart: Normal rate, no rub Lungs: Coarse upper airway sounds Abdomen:soft, Non-tender,  non-distended Extremities:trace edema Neurology: Alert awake and following command Dialysis access: Right IJ temporary HD catheter placed by PCCM on 1/29.  Jefferson Hendy Brindle 09/01/2024,10:33 AM  LOS: 37 days

## 2024-09-01 NOTE — Progress Notes (Signed)
 "  NAME:  [name], MRN:  [id number], DOB:  Feb 26, 1956, LOS: 37 ADMISSION DATE:  07/26/2024, CONSULTATION DATE:  1/15 REFERRING MD:  Kerrin, CHIEF COMPLAINT:  sepsis   History of Present Illness:  Ms. [name] is a 69 y/o woman with a history of tobacco abuse and lung adenocarcinoma status post RML lobectomy on 12/29.  At the time of surgery was a large cyst that was resected. Despite oversewing it time resection she later developed an esophageal leak.  She required an esophageal stent which she has had recurrent problems with related to positioning/migration of the stent (necessitating multiple EGDs for stent revision). From 1/15 through 1/18, the patient was consistently total body balance positive in setting of receiving NS @ 100 mL/hr and then starting TPN as of 1/18. Due to concomitant AKI, developed volume overload and pulmonary edema and hypertensive urgency ultimately requiring a Cardene  drip be started overnight 1/19. Also on 1/19, she underwent revision of the position of her esophageal stent to address a small esophageal leak.  Pertinent  Medical History  Lung adenocarcinoma Latent  TB, treated in 1980 HTN HLD DM OSA Tobacco abuse  Significant Hospital Events: Including procedures, antibiotic start and stop dates in addition to other pertinent events   12/29 RML lobectomy 1/7 EGD with esophageal stent 1/9 EGD  with stent reposition after migration to stomach 1/15 vanc added, zosyn  restarted. CT  1/19: EGD for stent repositioning due to ongoing small esophageal leak / stent migration 1/20: Gastrograffin study -> no esophageal leak. Started on clear liquid diet. 1/21: Full liquid diet. Diuresed. 1/23 rapid rise in oxygen requirements, back to NPO 1/24 CT with multilobar crazy paving, remained on NRB. Added linzolid 1/25 blood cultures drawn, changed zosyn > cefepime  + flagyl  1/26 intubated overnight 1/27 bronch with BAL, broadened antifungal coverage 1/28 1 unit pRBC, weaned  FiO2, cortrak placed by Dr Daniel 1/30 still awaiting fungal results.  Still with significant positive fluid balance but does not look vascularly overloaded.  Chest x-ray does look a little bit better.  Respiratory compliance looks good.  Heavily sedated, working on titrating sedation down 1/31 Extubated. 2/2 Stop CRRT. CT c/a/p, sent c diff which was neg. Cont unasyn  mica 2/3 stop vanc   Interim History / Subjective:  NAEON; still on fair amount of clevi - adding more oral agents. Really needs PT/OT Objective    Blood pressure (!) 138/43, pulse 78, temperature (!) 97.5 F (36.4 C), resp. rate 16, height 5' 2.99 (1.6 m), weight 81.1 kg, last menstrual period 10/26/1998, SpO2 93%. CVP:  [0 mmHg-21 mmHg] 0 mmHg      Intake/Output Summary (Last 24 hours) at 09/01/2024 1008 Last data filed at 09/01/2024 1000 Gross per 24 hour  Intake 3701.11 ml  Output 1475 ml  Net 2226.11 ml   Filed Weights   08/30/24 0449 08/31/24 0430 09/01/24 0416  Weight: 76.9 kg 76.2 kg 81.1 kg    Examination General: Middle-age female, laying in bed, deconditioned and tired appearing Psych: flat affect  Neuro: Awake, alert and oriented, nonfocal HEENT: NCAT, anicteric sclera, mucous membranes moist CV: S1-S2, no murmur, puffy hands and feet, warm Pulm: Room air, respirations are even and unlabored, diminished bases, weak cough Abdomen: Rounded, soft GU: Foley, yellow urine    Resolved problem list  ARDS Assessment and Plan    Esophageal leak s/p esophageal stent c/b migration now s/p repositioning  P -cont TPN -for swallow study 2/3, but do not see that this was completed, will touch base  with SLP.  Per nursing is not coughing immediately after drinking water and no signs of dysphagia. -Repeat plain film 2/3 with no evidence of esophageal leak  Acute resp failure w hypoxia, improving Lung ca s/p lobectomy  HCAP/aspiraiton Bronchial defect in BI  Hx tobacco use  P -chest tube with minimal output,  however TCTS would like to keep for now -IS, mobility, sat greater than 92% -cont unasyn , assume 5-day course but will touch base with TCTS  Sepsis -CT c/a/p w ASD, duodenitis  -had 9d flagyl , 8d cefepime , 7d linezolid   -Cdiff was r/o  -has been extended course of abx -- zosyn  fluconazole , changed to cefepime  flagyl  linezolid  and mica 1/27. Changed to PO vanc and unasyn  + mica 2/2  -fungal cx neg  P -dc PO vanc -continuing current abx (unasyn , micafungin )-no end date at current -follow white count fever curve   HTN P -wean clevi gtt SBP < 150 -currently what is keeping her in ICU - Increase Coreg  to 25 mg twice daily - Add amlodipine  5 mg daily - Switch hydralazine  to p.o. 50 mg every 8 hours - Clonidine  patch 0.3 mg  Nonoliguric AKI  P -nephro following - SCr 1.7> 2.22.  BUN 71> 96 - Lasix  80 mg daily - HD today  Anemia  -critical dz P -follow CBC, transfuse for goal > 7   Hyperglycemia  P -SSI  Deconditioning Severe protein energy malnutrition Plan -cont TPN  -PT, OT   Possible hypoactive ICU delirium -delirium precautions - Oriented  CC time: 56 The patient is critically ill with multiple organ system failure and requires high complexity decision making for assessment and support, frequent evaluation and titration of therapies, advanced monitoring, review of radiographic studies and interpretation of complex data.    Critical Care Time devoted to patient care services, exclusive of separately billable procedures, described in this note is 25   Tinnie FORBES Adolph DEVONNA [facility] 09/01/24 11:47 AM  Please see [email or web address] for pager details. From 7A-7P if no response, please call [phone]     "

## 2024-09-01 NOTE — Progress Notes (Addendum)
 [facility] DAILY ICU PROGRESS NOTE                   [address]            [place] [address]          [phone]   16 Days Post-Op Procedures (LRB): EGD (ESOPHAGOGASTRODUODENOSCOPY) (N/A) CONTROL OF HEMORRHAGE, GI TRACT, ENDOSCOPIC, BY CLIPPING OR OVERSEWING 19 days s/p bronchoscopy         26 days s/p EGD, reposition esophageal stent 28 days s/p EGD, placement of esophageal stent 37 days s/p Xi RML, LN dissection, intercostal nerve block, resection of esophageal diverticulum  Total Length of Stay:  LOS: 37 days   Subjective: Patient asking for help lifting broth up so she can use straw to sip on it. I assisted her with this and she drank it all. She had finished 2 juices prior to this.   Objective: Vital signs in last 24 hours: Temp:  [96.3 F (35.7 C)-98.4 F (36.9 C)] 96.8 F (36 C) (02/04 0715) Pulse Rate:  [65-96] 80 (02/04 0715) Cardiac Rhythm: Normal sinus rhythm (02/03 2358) Resp:  [11-28] 19 (02/04 0715) BP: (131-165)/(51-59) 165/59 (02/03 1700) SpO2:  [89 %-100 %] 92 % (02/04 0715) Arterial Line BP: (97-173)/(38-81) 139/44 (02/04 0715) Weight:  [81.1 kg] 81.1 kg (02/04 0416)  Filed Weights   08/30/24 0449 08/31/24 0430 09/01/24 0416  Weight: 76.9 kg 76.2 kg 81.1 kg    Weight change: 4.9 kg   Hemodynamic parameters for last 24 hours: CVP:  [1 mmHg-5 mmHg] 5 mmHg  Intake/Output from previous day: 02/03 0701 - 02/04 0700 In: 2933 [P.O.:590; I.V.:1424.3; IV Piggyback:918.7] Out: 1400 [Urine:1340; Chest Tube:60]  Intake/Output this shift: No intake/output data recorded.  Current Meds: Scheduled Meds:  sodium chloride    Intravenous Once   carvedilol   6.25 mg Oral BID WC   Chlorhexidine  Gluconate Cloth  6 each Topical Daily   cloNIDine   0.3 mg Transdermal Weekly   fentaNYL   1 patch Transdermal Q72H   furosemide   80 mg Intravenous Daily   Gerhardt's butt cream   Topical Daily   hydrALAZINE   20 mg Intravenous Q6H   insulin  aspart  0-20  Units Subcutaneous Q4H   ipratropium-albuterol   3 mL Nebulization BID   lidocaine   2 patch Transdermal Q24H   neomycin -bacitracin -polymyxin   Topical BID   nitroGLYCERIN   0.3 mg Transdermal Daily   pantoprazole  (PROTONIX ) IV  40 mg Intravenous Q12H   sodium chloride  flush  10-40 mL Intracatheter Q12H   Continuous Infusions:  acetaminophen  Stopped (09/01/24 0100)   ampicillin -sulbactam (UNASYN ) IV Stopped (09/01/24 0422)   clevidipine  21 mg/hr (09/01/24 0700)   labetalol  (NORMODYNE ) infusion 5 mg/mL Stopped (08/31/24 1540)   micafungin  (MYCAMINE ) 100 mg in sodium chloride  0.9 % 100 mL IVPB Stopped (08/31/24 1147)   promethazine  (PHENERGAN ) injection (IM or IVPB) Stopped (08/31/24 0008)   TPN ADULT (ION) 35 mL/hr at 09/01/24 0600   TPN ADULT (ION)     PRN Meds:.bisacodyl , diphenhydrAMINE , hydrALAZINE , HYDROmorphone  (DILAUDID ) injection, levalbuterol , lip balm, LORazepam , metoprolol  tartrate, ondansetron  (ZOFRAN ) IV, mouth rinse, phenol, promethazine  (PHENERGAN ) injection (IM or IVPB), sodium chloride  flush  Heart: RRR Lungs: Coarse Abdomen: Soft, non tender bowel sounds present Extremities: Mild LE edema Wound: Anterior chest wound clean, dry, no sign of infection Chest tube: to water seal  Lab Results: CBC: Recent Labs    08/30/24 0448 08/31/24 1010  WBC 37.0* 32.6*  HGB 9.5* 8.1*  HCT 25.4* 23.1*  PLT 207 177   BMET:  Recent Labs    08/31/24 0424 09/01/24 0356  NA 134* 132*  K 3.3* 3.8  CL 100 97*  CO2 19* 17*  GLUCOSE 150* 131*  BUN 71* 96*  CREATININE 1.70* 2.22*  CALCIUM 7.8* 8.0*    CMET: Lab Results  Component Value Date   WBC 32.6 (H) 08/31/2024   HGB 8.1 (L) 08/31/2024   HCT 23.1 (L) 08/31/2024   PLT 177 08/31/2024   GLUCOSE 131 (H) 09/01/2024   TRIG 213 (H) 08/28/2024   ALT 45 (H) 08/29/2024   AST 49 (H) 08/29/2024   NA 132 (L) 09/01/2024   K 3.8 09/01/2024   CL 97 (L) 09/01/2024   CREATININE 2.22 (H) 09/01/2024   BUN 96 (H) 09/01/2024   CO2  17 (L) 09/01/2024   TSH 3.532 02/28/2022   INR 1.0 08/18/2024   HGBA1C 5.9 (H) 07/20/2024   PT/INR: No results for input(s): LABPROT, INR in the last 72 hours. Radiology: DG ESOPHAGUS W SINGLE CM (SOL OR THIN BA) Result Date: 08/31/2024 CLINICAL DATA:  69 year old female with history of a right middle lobe lobectomy and midthoracic esophageal diverticular resection on 07/26/24 complicated by persistent esophageal perforation. Patient is status post esophageal stent placement on 08/04/24 which migrated into the stomach. A new stent was placed on 08/13/24 with old migrated stent removed at that time. Subsequent esophagram on 08/17/24 demonstrated no leak. Received requested for repeat esophagram to check for leak. EXAM: ESOPHAGUS/BARIUM SWALLOW STUDY TECHNIQUE: Single contrast examination was performed using Omnipaque  300. This exam was performed by Rayfield Buff, NP, and was supervised and interpreted by Dr. Rockey Kilts. FLUOROSCOPY: Radiation Exposure Index (as provided by the fluoroscopic device): 20.1 mGy Kerma COMPARISON:  CT Chest Abdomen Pelvis 08/30/2024, DG esophagram 08/17/2024 FINDINGS: Esophagus: Upper thoracic esophageal stent is similar in position. Small volume contrast does track around the outside of the stent, greater left than right. No contrast extravasation outside of the esophageal lumen. Esophageal motility: Mild dysmotility with stent in place. Hiatal Hernia: None. Other: None. IMPRESSION: No evidence of esophageal leak. Electronically Signed   By: Rockey Kilts M.D.   On: 08/31/2024 13:48     Assessment/Plan: S/P Procedures (LRB): EGD (ESOPHAGOGASTRODUODENOSCOPY) (N/A) CONTROL OF HEMORRHAGE, GI TRACT, ENDOSCOPIC, BY CLIPPING OR OVERSEWING  1. CV - SR and hypertensive this am. On Cleviprex  drip,  Clonidine  0.3 patch, Hydralazine  20 mg IV Q 6 ,  Coreg  6.25 mg bid, and Nitroglycerin  patch 0.3 mg. 2.  Pulmonary - ARF. CT done 02/02 showed severe improved but widespread  infectious/inflammatory changes in the lungs and small pocket of mediastinal air communicating with the posterior bronchus intermedius is similar to 1/15. Extubated 01/31. Chest tube is to water seal with 60 ml output for 24 hours. Continue Xopenex , Duo nebs. Appreciate pulmonary/CCM's assistance 4. CBGs 155/127/116. On TPN so continue accu checks. 5. Anemia-H and H this am decreased to 8.1 and 23.1 6. ID-on Unasyn  and Micafungin  for esophageal leak, aspiration PNA.  WBC decreased to 32,600.  7. GI- Esophageal stent in place. Esophagram done yesterday showed no evidence of leak, mild esophageal dysmotility with stent in place. On clear liquid diet, TPN. CT scan done 02/02 showed severe gastritis and duodenitis and she is on Protonix  IV bid. 8. AKI -Suspected to have ATN secondary to ischemic and nephrotoxic etiologies. CRRT started 01/29 and stopped 02/01. She is on Lasix  80 mg IV daily and making urine. Creatinine this am slightly increased to 2.22.  May need iHD;nephrology following   Kyla HERO ZimmermanPA-C 09/01/2024 7:29 AM  Patient seen and examined, agree with above Rounded with CCM this AM Will try full liquids Pain control remains an issue Refused to work with PT/OT WBC down to 26K this morning, afebrile on Unasyn  and micafungin  BP control remains an issue- CCM managing  Elspeth C. Kerrin, MD [facility] [phone]

## 2024-09-01 NOTE — Progress Notes (Signed)
 PT Cancellation Note  Patient Details Name: [name] MRN: [id number] DOB: 03-15-1956   Cancelled Treatment:    Reason Eval/Treat Not Completed: Pain limiting ability to participate this afternoon despite premedication. Pt reports it has been a bad day and declined offers of PT returning later in afternoon. Will continue to attempt as time/schedule allow.   Izetta Call, PT, DPT   Acute Rehabilitation Department Office [phone] Secure Chat Communication Preferred   Izetta JULIANNA Call 09/01/2024, 2:26 PM

## 2024-09-01 NOTE — Plan of Care (Signed)
" °  Problem: Nutritional: Goal: Will attain and maintain optimal nutritional status Outcome: Progressing   Problem: Neurological: Goal: Will regain or maintain usual level of consciousness Outcome: Progressing   Problem: Respiratory: Goal: Respiratory status will improve Outcome: Progressing   "

## 2024-09-01 NOTE — Consult Note (Addendum)
" °  CLINICAL SUPPORT TEAM - WOUND OSTOMY AND CONTINENCE TEAM  CONSULTATION SERVICES   WOC Nurse-Inpatient Note   WOC Nurse Consult Note: Reason for Consult: Consult requested for sacrum.  Performed remotely after review of progress notes and photos in the EMR.  Pt is critically ill with multiple systemic factors which can impair healing.  They are on a low airloss mattress to reduce pressure.  Sacrum with darker-colored red-purple skin; appearance is consistent with Deep tissue pressure injury. (DTPI) Approx 2X2cm, according to bedside nurses' wound care flow sheet. These types are pressure injuries ane high risk to evolve into full thickness skin loss with-in 7-10 days.     Pressure Injury POA: No Topical treatment orders provided for bedside nurses to perform as follows: Apply Xeroform gauze to sacrum wound Q day, then cover with foam dressing.  Change foam dressing Q 3 days or PRN soiling.  WOC team will reassess again in 7-10 days to determine if a change in the plan of care is indicated at that time.   Thank-you,  Stephane Fought MSN, RN, CWOCN, CWCN-AP, CNS Contact Mon-Fri 0700-1500: [phone]      "

## 2024-09-02 ENCOUNTER — Inpatient Hospital Stay (HOSPITAL_COMMUNITY)

## 2024-09-02 DIAGNOSIS — K9189 Other postprocedural complications and disorders of digestive system: Secondary | ICD-10-CM | POA: Diagnosis not present

## 2024-09-02 DIAGNOSIS — I10 Essential (primary) hypertension: Secondary | ICD-10-CM | POA: Diagnosis not present

## 2024-09-02 DIAGNOSIS — A419 Sepsis, unspecified organism: Secondary | ICD-10-CM | POA: Diagnosis not present

## 2024-09-02 DIAGNOSIS — E43 Unspecified severe protein-calorie malnutrition: Secondary | ICD-10-CM | POA: Diagnosis not present

## 2024-09-02 DIAGNOSIS — C342 Malignant neoplasm of middle lobe, bronchus or lung: Secondary | ICD-10-CM | POA: Diagnosis not present

## 2024-09-02 DIAGNOSIS — D649 Anemia, unspecified: Secondary | ICD-10-CM | POA: Diagnosis not present

## 2024-09-02 DIAGNOSIS — R739 Hyperglycemia, unspecified: Secondary | ICD-10-CM | POA: Diagnosis not present

## 2024-09-02 DIAGNOSIS — J9601 Acute respiratory failure with hypoxia: Secondary | ICD-10-CM | POA: Diagnosis not present

## 2024-09-02 LAB — CBC
HCT: 20.1 % — ABNORMAL LOW (ref 36.0–46.0)
Hemoglobin: 6.9 g/dL — CL (ref 12.0–15.0)
MCH: 32.1 pg (ref 26.0–34.0)
MCHC: 34.3 g/dL (ref 30.0–36.0)
MCV: 93.5 fL (ref 80.0–100.0)
Platelets: 164 10*3/uL (ref 150–400)
RBC: 2.15 MIL/uL — ABNORMAL LOW (ref 3.87–5.11)
RDW: 16.8 % — ABNORMAL HIGH (ref 11.5–15.5)
WBC: 25.6 10*3/uL — ABNORMAL HIGH (ref 4.0–10.5)
nRBC: 0.2 % (ref 0.0–0.2)

## 2024-09-02 LAB — HEPATIC FUNCTION PANEL
ALT: 27 U/L (ref 0–44)
AST: 28 U/L (ref 15–41)
Albumin: 2.8 g/dL — ABNORMAL LOW (ref 3.5–5.0)
Alkaline Phosphatase: 97 U/L (ref 38–126)
Bilirubin, Direct: 0.3 mg/dL — ABNORMAL HIGH (ref 0.0–0.2)
Indirect Bilirubin: 0 mg/dL — ABNORMAL LOW (ref 0.3–0.9)
Total Bilirubin: 0.3 mg/dL (ref 0.0–1.2)
Total Protein: 5.1 g/dL — ABNORMAL LOW (ref 6.5–8.1)

## 2024-09-02 LAB — RENAL FUNCTION PANEL
Albumin: 2.7 g/dL — ABNORMAL LOW (ref 3.5–5.0)
Anion gap: 18 — ABNORMAL HIGH (ref 5–15)
BUN: 101 mg/dL — ABNORMAL HIGH (ref 8–23)
CO2: 18 mmol/L — ABNORMAL LOW (ref 22–32)
Calcium: 7.9 mg/dL — ABNORMAL LOW (ref 8.9–10.3)
Chloride: 94 mmol/L — ABNORMAL LOW (ref 98–111)
Creatinine, Ser: 2.64 mg/dL — ABNORMAL HIGH (ref 0.44–1.00)
GFR, Estimated: 19 mL/min — ABNORMAL LOW
Glucose, Bld: 136 mg/dL — ABNORMAL HIGH (ref 70–99)
Phosphorus: 7 mg/dL — ABNORMAL HIGH (ref 2.5–4.6)
Potassium: 3.4 mmol/L — ABNORMAL LOW (ref 3.5–5.1)
Sodium: 130 mmol/L — ABNORMAL LOW (ref 135–145)

## 2024-09-02 LAB — GLUCOSE, CAPILLARY
Glucose-Capillary: 106 mg/dL — ABNORMAL HIGH (ref 70–99)
Glucose-Capillary: 111 mg/dL — ABNORMAL HIGH (ref 70–99)
Glucose-Capillary: 130 mg/dL — ABNORMAL HIGH (ref 70–99)
Glucose-Capillary: 135 mg/dL — ABNORMAL HIGH (ref 70–99)
Glucose-Capillary: 146 mg/dL — ABNORMAL HIGH (ref 70–99)
Glucose-Capillary: 165 mg/dL — ABNORMAL HIGH (ref 70–99)

## 2024-09-02 LAB — MAGNESIUM: Magnesium: 1.9 mg/dL (ref 1.7–2.4)

## 2024-09-02 MED ORDER — HYDRALAZINE HCL 50 MG PO TABS
100.0000 mg | ORAL_TABLET | Freq: Three times a day (TID) | ORAL | Status: AC
  Administered 2024-09-02 – 2024-09-03 (×4): 100 mg via ORAL
  Filled 2024-09-02 (×5): qty 2

## 2024-09-02 MED ORDER — POTASSIUM CHLORIDE 20 MEQ PO PACK
40.0000 meq | PACK | Freq: Once | ORAL | Status: AC
  Administered 2024-09-02: 40 meq via ORAL
  Filled 2024-09-02: qty 2

## 2024-09-02 MED ORDER — TRACE MINERALS CU-MN-SE-ZN 300-55-60-3000 MCG/ML IV SOLN
INTRAVENOUS | Status: AC
  Filled 2024-09-02: qty 448

## 2024-09-02 MED ORDER — AMLODIPINE BESYLATE 10 MG PO TABS
10.0000 mg | ORAL_TABLET | Freq: Every day | ORAL | Status: AC
  Administered 2024-09-02 – 2024-09-03 (×2): 10 mg via ORAL
  Filled 2024-09-02 (×2): qty 1

## 2024-09-02 MED ORDER — HEPARIN SODIUM (PORCINE) 1000 UNIT/ML IJ SOLN
INTRAMUSCULAR | Status: AC
  Filled 2024-09-02: qty 4

## 2024-09-02 NOTE — Progress Notes (Signed)
 "  NAME:  [name], MRN:  [id number], DOB:  Apr 20, 1956, LOS: 38 ADMISSION DATE:  07/26/2024, CONSULTATION DATE:  1/15 REFERRING MD:  Kerrin, CHIEF COMPLAINT:  sepsis   History of Present Illness:  [name] is a 69 y/o woman with a history of tobacco abuse and lung adenocarcinoma status post RML lobectomy on 12/29.  At the time of surgery was a large cyst that was resected. Despite oversewing it time resection she later developed an esophageal leak.  She required an esophageal stent which she has had recurrent problems with related to positioning/migration of the stent (necessitating multiple EGDs for stent revision). From 1/15 through 1/18, the patient was consistently total body balance positive in setting of receiving NS @ 100 mL/hr and then starting TPN as of 1/18. Due to concomitant AKI, developed volume overload and pulmonary edema and hypertensive urgency ultimately requiring a Cardene  drip be started overnight 1/19. Also on 1/19, she underwent revision of the position of her esophageal stent to address a small esophageal leak.  Pertinent  Medical History  Lung adenocarcinoma Latent  TB, treated in 1980 HTN HLD DM OSA Tobacco abuse  Significant Hospital Events: Including procedures, antibiotic start and stop dates in addition to other pertinent events   12/29 RML lobectomy 1/7 EGD with esophageal stent 1/9 EGD  with stent reposition after migration to stomach 1/15 vanc added, zosyn  restarted. CT  1/19: EGD for stent repositioning due to ongoing small esophageal leak / stent migration 1/20: Gastrograffin study -> no esophageal leak. Started on clear liquid diet. 1/21: Full liquid diet. Diuresed. 1/23 rapid rise in oxygen requirements, back to NPO 1/24 CT with multilobar crazy paving, remained on NRB. Added linzolid 1/25 blood cultures drawn, changed zosyn > cefepime  + flagyl  1/26 intubated overnight 1/27 bronch with BAL, broadened antifungal coverage 1/28 1 unit pRBC, weaned  FiO2, cortrak placed by Dr Daniel 1/30 still awaiting fungal results.  Still with significant positive fluid balance but does not look vascularly overloaded.  Chest x-ray does look a little bit better.  Respiratory compliance looks good.  Heavily sedated, working on titrating sedation down 1/31 Extubated. 2/2 Stop CRRT. CT c/a/p, sent c diff which was neg. Cont unasyn  mica 2/3 stop vanc  2/4 concerns for ongoing aspiration, surgery consulted   Interim History / Subjective:  Remains on maximal cleviprex  No acute overnight events   Objective    Blood pressure (!) 131/40, pulse 84, temperature 99 F (37.2 C), temperature source Bladder, resp. rate 19, height 5' 2.99 (1.6 m), weight 82.4 kg, last menstrual period 10/26/1998, SpO2 93%. CVP:  [0 mmHg-45 mmHg] 7 mmHg      Intake/Output Summary (Last 24 hours) at 09/02/2024 9074 Last data filed at 09/02/2024 0900 Gross per 24 hour  Intake 2724.86 ml  Output 1945 ml  Net 779.86 ml   Filed Weights   09/01/24 0416 09/02/24 0500 09/02/24 0847  Weight: 81.1 kg 82.4 kg 82.4 kg    General:  chronically ill-appearing F resting in bed, fatigued  HEENT: MM pink/moist Neuro: intermittently somnolent but awakens to voice, oriented  CV: s1s2 rrr, no m/r/g PULM:  diminished bilaterally without distress, rhonchi or wheezing  GI: soft, non-tender  Extremities: warm/dry, no edema    Resolved problem list  ARDS Assessment and Plan    Esophageal leak s/p esophageal stent c/b migration now s/p repositioning  -evaluated again by speech, continues to have some fluid leak around the stent, but is not truly aspirating.  This leak causes continued coughing  and discomfort, will consult surgery for consideration of G-J tube  -cont TPN -Repeat plain film 2/3 with no evidence of esophageal leak  Acute resp failure w hypoxia, improving Lung ca s/p lobectomy  HCAP/aspiraiton Bronchial defect in BI  Hx tobacco use  -chest tubes out today -IS, mobility, sat  greater than 92% -cont unasyn  and micafungin   Sepsis CT c/a/p w ASD, duodenitis  Cdiff was r/o  Sepsis physiology resolved -had 9d flagyl , 8d cefepime , 7d linezolid   -has been extended course of abx -- zosyn  fluconazole , changed to cefepime  flagyl  linezolid  and mica 1/27. Changed to PO vanc and unasyn  + mica 2/2  -continuing current abx (unasyn , micafungin )-no end date at current, consider transition to difulcan  -follow white count fever curve   HTN -wean clevi gtt SBP < 150 -currently what is keeping her in ICU - Increase Coreg  to 25 mg twice daily - Amlodipine  increased from 5 to 10, hydralazine  to  - Switch hydralazine  to p.o. 50 mg every 8 hours - Clonidine  patch 0.3 mg  Nonoliguric AKI  P -nephro following - SCr 1.7> 2.22.  BUN 71> 96 - Lasix  80 mg daily - HD today  Anemia  -critical dz -follow CBC, transfuse for goal > 7  -6.9 today, decision made to monitor in the setting of hypertension   Hyperglycemia  -SSI  Deconditioning Severe protein energy malnutrition  -cont TPN  -PT, OT   Possible hypoactive ICU delirium -delirium precautions - Oriented  CC time: 40 minutes   CRITICAL CARE Performed by: [name] [name]   Total critical care time: 40 minutes  Critical care time was exclusive of separately billable procedures and treating other patients.  Critical care was necessary to treat or prevent imminent or life-threatening deterioration.  Critical care was time spent personally by me on the following activities: development of treatment plan with patient and/or surrogate as well as nursing, discussions with consultants, evaluation of patient's response to treatment, examination of patient, obtaining history from patient or surrogate, ordering and performing treatments and interventions, ordering and review of laboratory studies, ordering and review of radiographic studies, pulse oximetry and re-evaluation of patient's condition.  [name] Kelly Bloyd,  PA-C [facility] 7a-7p: For contact information, see AMION. If no response to pager, please call PCCM 2-H APP. After 7p: Please call PCCM APP on-call for 2-H.   "

## 2024-09-02 NOTE — Progress Notes (Signed)
" °   09/02/24 1230  Vitals  Temp 99.3 F (37.4 C)  Temp Source Bladder  BP (!) 124/55  MAP (mmHg) 75  BP Location Left Arm  BP Method Automatic  Patient Position (if appropriate) Lying  Pulse Rate 80  Pulse Rate Source Monitor  ECG Heart Rate 87  Resp 15  Oxygen Therapy  SpO2 92 %  O2 Device Nasal Cannula  O2 Flow Rate (L/min) 1 L/min  Post Treatment  Dialyzer Clearance Lightly streaked  Hemodialysis Intake (mL) 0 mL  Liters Processed 63  Fluid Removed (mL) 0 mL  Tolerated HD Treatment Yes  Post-Hemodialysis Comments resting tolerated well  Note  Patient Observations sleeping  Hemodialysis Catheter Right Internal jugular  Placement Date/Time: 08/26/24 1100   Placed prior to admission: No  Time Out: Correct site;Correct patient;Correct procedure  Maximum sterile barrier precautions: Hand hygiene;Cap;Mask;Sterile gown;Sterile gloves;Large sterile sheet;Sterile probe cove...  Site Condition No complications  Blue Lumen Status Flushed;Heparin  locked;Antimicrobial dead end cap  Red Lumen Status Flushed;Heparin  locked;Antimicrobial dead end cap  Catheter fill solution Heparin  1000 units/ml  Catheter fill volume (Arterial) 1.2 cc  Catheter fill volume (Venous) 1.2  Dressing Type Transparent  Dressing Status Clean, Dry, Intact  Interventions New dressing  Drainage Description None  Dressing Change Due 09/09/24  Post treatment catheter status Capped and Clamped   Received patient in bed to unit.  Alert and oriented.  Informed consent signed and in chart.   TX duration:3  Patient tolerated well.  Transported back to the room  Alert, without acute distress.  Hand-off given to patient's nurse.   Access used: rt IJ Access issues: none  Total UF removed: 0 Medication(s) given: none Post HD VS: see above Post HD weight: n/a   [name] "

## 2024-09-02 NOTE — Progress Notes (Signed)
" ° °  Inpatient Rehabilitation Admissions Coordinator   Patient continues to not be at a level to pursue CIR admit. Other venues should be pursued.  Heron Leavell, RN, MSN Rehab Admissions Coordinator [phone] 09/02/2024 6:46 PM  "

## 2024-09-02 NOTE — Progress Notes (Incomplete)
 [facility] DAILY ICU PROGRESS NOTE                   [address]            [place] [address]          [phone]   17 Days Post-Op Procedures (LRB): EGD (ESOPHAGOGASTRODUODENOSCOPY) (N/A) CONTROL OF HEMORRHAGE, GI TRACT, ENDOSCOPIC, BY CLIPPING OR OVERSEWING 20 days s/p bronchoscopy         27 days s/p EGD, reposition esophageal stent 29 days s/p EGD, placement of esophageal stent 38 days s/p Xi RML, LN dissection, intercostal nerve block, resection of esophageal diverticulum  Total Length of Stay:  LOS: 38 days   Subjective: Patient   Objective: Vital signs in last 24 hours: Temp:  [97.3 F (36.3 C)-99.1 F (37.3 C)] 99 F (37.2 C) (02/05 0700) Pulse Rate:  [74-93] 89 (02/05 0700) Cardiac Rhythm: Normal sinus rhythm (02/05 0400) Resp:  [0-29] 19 (02/05 0700) BP: (138-151)/(43-54) 151/54 (02/04 1632) SpO2:  [87 %-97 %] 89 % (02/05 0804) Arterial Line BP: (130-154)/(40-54) 148/46 (02/05 0700) Weight:  [82.4 kg] 82.4 kg (02/05 0500)  Filed Weights   08/31/24 0430 09/01/24 0416 09/02/24 0500  Weight: 76.2 kg 81.1 kg 82.4 kg    Weight change: 1.3 kg   Hemodynamic parameters for last 24 hours: CVP:  [0 mmHg-45 mmHg] 9 mmHg  Intake/Output from previous day: 02/04 0701 - 02/05 0700 In: 3558 [P.O.:1330; I.V.:1823; IV Piggyback:405] Out: 2030 [Urine:1770; Stool:200; Chest Tube:60]  Intake/Output this shift: Total I/O In: 76.8 [I.V.:76.8] Out: 175 [Urine:175]  Current Meds: Scheduled Meds:  sodium chloride    Intravenous Once   amLODipine   5 mg Oral Daily   carvedilol   25 mg Oral BID WC   Chlorhexidine  Gluconate Cloth  6 each Topical Daily   cloNIDine   0.3 mg Transdermal Weekly   fentaNYL   1 patch Transdermal Q72H   furosemide   80 mg Intravenous Daily   Gerhardt's butt cream   Topical Daily   hydrALAZINE   50 mg Oral Q8H   insulin  aspart  0-20 Units Subcutaneous Q4H   ipratropium-albuterol   3 mL Nebulization BID   lidocaine   2 patch Transdermal  Q24H   neomycin -bacitracin -polymyxin   Topical BID   nitroGLYCERIN   0.3 mg Transdermal Daily   pantoprazole  (PROTONIX ) IV  40 mg Intravenous Q12H   sodium chloride  flush  10-40 mL Intracatheter Q12H   Continuous Infusions:  ampicillin -sulbactam (UNASYN ) IV Stopped (09/01/24 2357)   clevidipine  21 mg/hr (09/02/24 0802)   micafungin  (MYCAMINE ) 100 mg in sodium chloride  0.9 % 100 mL IVPB Stopped (09/01/24 1111)   promethazine  (PHENERGAN ) injection (IM or IVPB) Stopped (08/31/24 0008)   TPN ADULT (ION) 35 mL/hr at 09/02/24 0800   PRN Meds:.acetaminophen  (TYLENOL ) oral liquid 160 mg/5 mL, bisacodyl , diphenhydrAMINE , hydrALAZINE , HYDROmorphone  (DILAUDID ) injection, levalbuterol , lip balm, LORazepam , metoprolol  tartrate, ondansetron  (ZOFRAN ) IV, mouth rinse, oxyCODONE , phenol, promethazine  (PHENERGAN ) injection (IM or IVPB), sodium chloride  flush  Heart: RRR Lungs: Coarse Abdomen: Soft, non tender bowel sounds present Extremities: Mild LE edema Wound: Anterior chest wound clean, dry, no sign of infection Chest tube: to water seal  Lab Results: CBC: Recent Labs    08/31/24 1010 09/01/24 1026  WBC 32.6* 26.6*  HGB 8.1* 8.1*  HCT 23.1* 21.0*  PLT 177 163   BMET:  Recent Labs    09/01/24 0356 09/02/24 0329  NA 132* 130*  K 3.8 3.4*  CL 97* 94*  CO2 17* 18*  GLUCOSE 131*  136*  BUN 96* 101*  CREATININE 2.22* 2.64*  CALCIUM 8.0* 7.9*    CMET: Lab Results  Component Value Date   WBC 26.6 (H) 09/01/2024   HGB 8.1 (L) 09/01/2024   HCT 21.0 (L) 09/01/2024   PLT 163 09/01/2024   GLUCOSE 136 (H) 09/02/2024   TRIG 213 (H) 08/28/2024   ALT 27 09/02/2024   AST 28 09/02/2024   NA 130 (L) 09/02/2024   K 3.4 (L) 09/02/2024   CL 94 (L) 09/02/2024   CREATININE 2.64 (H) 09/02/2024   BUN 101 (H) 09/02/2024   CO2 18 (L) 09/02/2024   TSH 3.532 02/28/2022   INR 1.0 08/18/2024   HGBA1C 5.9 (H) 07/20/2024   PT/INR: No results for input(s): LABPROT, INR in the last 72  hours. Radiology: No results found.    Assessment/Plan: S/P Procedures (LRB): EGD (ESOPHAGOGASTRODUODENOSCOPY) (N/A) CONTROL OF HEMORRHAGE, GI TRACT, ENDOSCOPIC, BY CLIPPING OR OVERSEWING  1. CV - SR and hypertensive this am. On Cleviprex  drip,  Clonidine  0.3 patch, Hydralazine  50 mg IV Q 8 ,  Coreg  25 mg bid, and Nitroglycerin  patch 0.3 mg. 2.  Pulmonary - ARF. CT done 02/02 showed severe improved but widespread infectious/inflammatory changes in the lungs and small pocket of mediastinal air communicating with the posterior bronchus intermedius is similar to 1/15. Chest tube is to water seal with minor output. Continue Xopenex , Duo nebs. Appreciate pulmonary/CCM's assistance 4. CBGs 118/135/165. On TPN so continue accu checks. 5. Anemia-H and H yesterday decreased to 8.1 and 23.1.  Await this am's result 6. ID-on Unasyn  and Micafungin  for esophageal leak, aspiration PNA.  WBC yesterday decreased to 32,600. Await this am's result 7. GI- Esophageal stent in place. On full liquid diet, TPN. CT scan done 02/02 showed severe gastritis and duodenitis and she is on Protonix  IV bid. 8. AKI -Suspected to have ATN secondary to ischemic and nephrotoxic etiologies. CRRT started 01/29 and stopped 02/01. She is on Lasix  80 mg IV daily and making urine. Creatinine this am slightly increased to 2.64. May need iHD;nephrology following  9. Very deconditioned-PT/OT. Hopefully, she will participate today  Kyla HERO ZimmermanPA-C 09/02/2024 8:16 AM

## 2024-09-02 NOTE — Progress Notes (Signed)
 [facility] NEPHROLOGY PROGRESS NOTE  Assessment/ Plan: Pt is a 69 y.o. yo female    # Acute kidney Injury: Suspected to have ATN secondary to ischemic and nephrotoxic etiologies.  Serologies negative for GN including vasculitis/anti-GBM given previous concerns of gross hematuria.   -Started CRRT on 1/29 for azotemia.  Stopped CRRT on the morning of 2/1 - Continue IV Lasix  80 mg daily -HD today given rapidly rising BUN -Reassess HD needs daily given good UOP and signs of potential recovery -Strict ins and out, monitor labs.  # Esophageal leak following resection of esophageal diverticulum: Underwent placement of a new esophageal stent previous one migrated distally into the stomach.  EGD and esophagram without evidence of leak.   # Hypertensive urgency: Required Cleviprex  IV, monitor BP.  Continue other oral medications.   # Anemia: Requiring blood transfusion.  Continue transfusions as needed per primary team.  Consider ESA  # Acute hypoxic respiratory failure: No longer intubated.  Much improved     Subjective: Patient states she is tired and has back pain but otherwise denies any complaints.  Did not receive dialysis yesterday.  Planning to get it this morning.  Good urine output but creatinine and BUN still rising.  Objective Vital signs in last 24 hours: Vitals:   09/02/24 0929 09/02/24 0930 09/02/24 0945 09/02/24 1000  BP:  (!) 112/49 (!) 116/51 (!) 115/51  Pulse: 85 85 86 85  Resp: 20 20 20 17   Temp: 98.8 F (37.1 C) 98.8 F (37.1 C) 98.8 F (37.1 C) 98.8 F (37.1 C)  TempSrc:      SpO2: 94% 94% 95% 95%  Weight:      Height:       Weight change: 1.3 kg  Intake/Output Summary (Last 24 hours) at 09/02/2024 1007 Last data filed at 09/02/2024 0900 Gross per 24 hour  Intake 2409.16 ml  Output 1830 ml  Net 579.16 ml       Labs: RENAL PANEL Recent Labs  Lab 08/29/24 0418 08/29/24 1510 08/30/24 0448 08/31/24 0424 08/31/24 0500 09/01/24 0356  09/02/24 0329  NA 133* 131* 133* 134*  --  132* 130*  K 3.7 4.2 3.7 3.3*  --  3.8 3.4*  CL 99 97* 99 100  --  97* 94*  CO2 22 21* 22 19*  --  17* 18*  GLUCOSE 161* 170* 115* 150*  --  131* 136*  BUN 48* 41* 37* 71*  --  96* 101*  CREATININE 1.23* 1.12* 0.96 1.70*  --  2.22* 2.64*  CALCIUM 8.1* 8.2* 8.1* 7.8*  --  8.0* 7.9*  MG 2.5*  --  2.4  --  2.1 2.0 1.9  PHOS 2.9 3.3 2.8 4.9*  --  7.2* 7.0*  ALBUMIN  2.8* 2.8* 2.7* 2.6*  --  2.6* 2.8*  2.7*    Liver Function Tests: Recent Labs  Lab 08/28/24 0404 08/28/24 1602 08/29/24 0418 08/29/24 1510 08/31/24 0424 09/01/24 0356 09/02/24 0329  AST 25  --  49*  --   --   --  28  ALT 20  --  45*  --   --   --  27  ALKPHOS 127*  --  127*  --   --   --  97  BILITOT 0.3  --  0.4  --   --   --  0.3  PROT 5.3*  --  5.7*  --   --   --  5.1*  ALBUMIN  2.5*   < > 2.8*   < >  2.6* 2.6* 2.8*  2.7*   < > = values in this interval not displayed.   No results for input(s): LIPASE, AMYLASE in the last 168 hours. No results for input(s): AMMONIA in the last 168 hours. CBC: Recent Labs    08/29/24 0418 08/30/24 0448 08/31/24 1010 09/01/24 1026 09/02/24 0744  HGB 9.9* 9.5* 8.1* 8.1* 6.9*  MCV 87.6 90.4 92.0 91.7 93.5    Cardiac Enzymes: No results for input(s): CKTOTAL, CKMB, CKMBINDEX, TROPONINI in the last 168 hours. CBG: Recent Labs  Lab 09/01/24 1527 09/01/24 1958 09/01/24 2326 09/02/24 0352 09/02/24 0802  GLUCAP 114* 140* 118* 135* 165*    Iron Studies: No results for input(s): IRON, TIBC, TRANSFERRIN, FERRITIN in the last 72 hours. Studies/Results: DG CHEST PORT 1 VIEW Result Date: 09/02/2024 CLINICAL DATA:  10031 Cough 10031 EXAM: PORTABLE CHEST 1 VIEW COMPARISON:  08/30/2024. FINDINGS: Low lung volume. Redemonstration of diffuse increased interstitial markings throughout bilateral lungs, increased since the prior study. Findings are nonspecific but concerning for combination of atypical pneumonia with  superimposed worsening pulmonary edema. Correlate clinically. There is subtle blunting of bilateral lateral costophrenic angles, which may represent trace pleural effusions. Stable cardio-mediastinal silhouette. No acute osseous abnormalities. The soft tissues are within normal limits. Upper esophageal stent noted. There is right IJ hemodialysis catheter with its tip overlying the cavoatrial junction region. Right-sided PICC line noted with its tip overlying the lower portion of superior vena cava. IMPRESSION: *Redemonstration of diffuse increased interstitial markings throughout bilateral lungs, increased since the prior study. Findings are nonspecific but concerning for combination of atypical pneumonia with superimposed worsening pulmonary edema. Electronically Signed   By: Ree Molt M.D.   On: 09/02/2024 09:23   DG ESOPHAGUS W SINGLE CM (SOL OR THIN BA) Result Date: 08/31/2024 CLINICAL DATA:  69 year old female with history of a right middle lobe lobectomy and midthoracic esophageal diverticular resection on 07/26/24 complicated by persistent esophageal perforation. Patient is status post esophageal stent placement on 08/04/24 which migrated into the stomach. A new stent was placed on 08/13/24 with old migrated stent removed at that time. Subsequent esophagram on 08/17/24 demonstrated no leak. Received requested for repeat esophagram to check for leak. EXAM: ESOPHAGUS/BARIUM SWALLOW STUDY TECHNIQUE: Single contrast examination was performed using Omnipaque  300. This exam was performed by Rayfield Buff, NP, and was supervised and interpreted by Dr. Rockey Kilts. FLUOROSCOPY: Radiation Exposure Index (as provided by the fluoroscopic device): 20.1 mGy Kerma COMPARISON:  CT Chest Abdomen Pelvis 08/30/2024, DG esophagram 08/17/2024 FINDINGS: Esophagus: Upper thoracic esophageal stent is similar in position. Small volume contrast does track around the outside of the stent, greater left than right. No contrast  extravasation outside of the esophageal lumen. Esophageal motility: Mild dysmotility with stent in place. Hiatal Hernia: None. Other: None. IMPRESSION: No evidence of esophageal leak. Electronically Signed   By: Rockey Kilts M.D.   On: 08/31/2024 13:48    Medications: Infusions:  ampicillin -sulbactam (UNASYN ) IV Stopped (09/01/24 2357)   clevidipine  21 mg/hr (09/02/24 0900)   micafungin  (MYCAMINE ) 100 mg in sodium chloride  0.9 % 100 mL IVPB Stopped (09/01/24 1111)   promethazine  (PHENERGAN ) injection (IM or IVPB) Stopped (08/31/24 0008)   TPN ADULT (ION) 35 mL/hr at 09/02/24 0900    Scheduled Medications:  sodium chloride    Intravenous Once   amLODipine   5 mg Oral Daily   carvedilol   25 mg Oral BID WC   Chlorhexidine  Gluconate Cloth  6 each Topical Daily   cloNIDine   0.3 mg  Transdermal Weekly   fentaNYL   1 patch Transdermal Q72H   furosemide   80 mg Intravenous Daily   Gerhardt's butt cream   Topical Daily   hydrALAZINE   50 mg Oral Q8H   insulin  aspart  0-20 Units Subcutaneous Q4H   ipratropium-albuterol   3 mL Nebulization BID   lidocaine   2 patch Transdermal Q24H   neomycin -bacitracin -polymyxin   Topical BID   nitroGLYCERIN   0.3 mg Transdermal Daily   pantoprazole  (PROTONIX ) IV  40 mg Intravenous Q12H   sodium chloride  flush  10-40 mL Intracatheter Q12H    have reviewed scheduled and prn medications.  Physical Exam: General: Chronically ill-appearing, lying in bed, no distress Heart: Normal rate, no rub Lungs: Left chest rise, mild increased work of breathing Abdomen:soft, Non-tender, non-distended Extremities:trace edema Neurology: Alert awake and following command Dialysis access: Right IJ temporary HD catheter placed by PCCM on 1/29.  Jefferson Dontrey Snellgrove 09/02/2024,10:07 AM  LOS: 38 days

## 2024-09-02 NOTE — Progress Notes (Addendum)
 EVENING ROUNDS NOTE :     [address] [employer] [address]             [phone]               17 Days Post-Op Procedures (LRB): EGD (ESOPHAGOGASTRODUODENOSCOPY) (N/A) CONTROL OF HEMORRHAGE, GI TRACT, ENDOSCOPIC, BY CLIPPING OR OVERSEWING   Total Length of Stay:  LOS: 38 days  Events:   No events Awaiting formal swallow     BP (!) 165/47   Pulse 89   Temp (!) 100.4 F (38 C)   Resp (!) 23   Ht 5' 2.99 (1.6 m)   Wt 82.4 kg   LMP 10/26/1998 Comment: ~IN 40s  SpO2 95%   BMI 32.19 kg/m   CVP:  [0 mmHg-44 mmHg] 16 mmHg      ampicillin -sulbactam (UNASYN ) IV Stopped (09/02/24 1318)   clevidipine  21 mg/hr (09/02/24 1800)   micafungin  (MYCAMINE ) 100 mg in sodium chloride  0.9 % 100 mL IVPB Stopped (09/02/24 1529)   promethazine  (PHENERGAN ) injection (IM or IVPB) Stopped (08/31/24 0008)   TPN ADULT (ION) 35 mL/hr at 09/02/24 1800    I/O last 3 completed shifts: In: 4820.7 [P.O.:1330; I.V.:2644; IV Piggyback:846.7] Out: 2430 [Urine:2170; Stool:200; Chest Tube:60]      Latest Ref Rng & Units 09/02/2024    7:44 AM 09/01/2024   10:26 AM 08/31/2024   10:10 AM  CBC  WBC 4.0 - 10.5 K/uL 25.6  26.6  32.6   Hemoglobin 12.0 - 15.0 g/dL 6.9  8.1  8.1   Hematocrit 36.0 - 46.0 % 20.1  21.0  23.1   Platelets 150 - 400 K/uL 164  163  177        Latest Ref Rng & Units 09/02/2024    3:29 AM 09/01/2024    3:56 AM 08/31/2024    4:24 AM  BMP  Glucose 70 - 99 mg/dL 863  868  849   BUN 8 - 23 mg/dL 898  96  71   Creatinine 0.44 - 1.00 mg/dL 7.35  7.77  8.29   Sodium 135 - 145 mmol/L 130  132  134   Potassium 3.5 - 5.1 mmol/L 3.4  3.8  3.3   Chloride 98 - 111 mmol/L 94  97  100   CO2 22 - 32 mmol/L 18  17  19    Calcium 8.9 - 10.3 mg/dL 7.9  8.0  7.8     ABG    Component Value Date/Time   PHART 7.297 (L) 08/26/2024 0735   PCO2ART 39.3 08/26/2024 0735   PO2ART 76 (L) 08/26/2024 0735   HCO3 19.2 (L) 08/26/2024 0735   TCO2 20 (L) 08/26/2024 0735    ACIDBASEDEF 7.0 (H) 08/26/2024 0735   O2SAT 93 08/26/2024 0735       Kelly Rayas, MD 09/02/2024 6:20 PM

## 2024-09-02 NOTE — Plan of Care (Signed)
" °  Problem: Cardiac: Goal: Ability to maintain an adequate cardiac output Outcome: Progressing Goal: Will show no evidence of cardiac arrhythmias Outcome: Progressing   Problem: Neurological: Goal: Will regain or maintain usual level of consciousness Outcome: Progressing   Problem: Respiratory: Goal: Will regain and/or maintain adequate ventilation Outcome: Progressing Goal: Respiratory status will improve Outcome: Progressing   Problem: Skin Integrity: Goal: Demonstrates signs of wound healing without infection Outcome: Progressing   Problem: Urinary Elimination: Goal: Will remain free from infection Outcome: Progressing   Problem: Clinical Measurements: Goal: Will remain free from infection Outcome: Progressing Goal: Diagnostic test results will improve Outcome: Progressing Goal: Respiratory complications will improve Outcome: Progressing Goal: Cardiovascular complication will be avoided Outcome: Progressing   Problem: Elimination: Goal: Will not experience complications related to bowel motility Outcome: Progressing Goal: Will not experience complications related to urinary retention Outcome: Progressing   Problem: Safety: Goal: Ability to remain free from injury will improve Outcome: Progressing   Problem: Cardiac: Goal: Will achieve and/or maintain hemodynamic stability Outcome: Progressing   Problem: Clinical Measurements: Goal: Postoperative complications will be avoided or minimized Outcome: Progressing   Problem: Respiratory: Goal: Respiratory status will improve Outcome: Progressing   Problem: Skin Integrity: Goal: Wound healing without signs and symptoms infection will improve Outcome: Progressing   Problem: Fluid Volume: Goal: Ability to maintain a balanced intake and output will improve Outcome: Progressing   Problem: Metabolic: Goal: Ability to maintain appropriate glucose levels will improve Outcome: Progressing   Problem: Skin  Integrity: Goal: Risk for impaired skin integrity will decrease Outcome: Progressing   Problem: Tissue Perfusion: Goal: Adequacy of tissue perfusion will improve Outcome: Progressing   "

## 2024-09-02 NOTE — Progress Notes (Signed)
 Physical Therapy Treatment Patient Details Name: [name] MRN: [id number] DOB: 26-Mar-1956 Today's Date: 09/02/2024   History of Present Illness The pt is a 69 yo female presenting 07/26/24 for Right middle lobectomy to remove non-small cell carcinoma and resection of esophogeal diverticulum. Returned to OR 08/04/24 for EGD with esophageal stent, s/p esophageal perforation. 08/06/24 return to OR for replacement of stent due to migration into stomach. 1/16 bronchoscopy, and bronchoalveolar lavage, replacement of new esophagus stent with clipping, 08/16/24 upper GI endoscopy. New AKI. Pt desaturating requiring NRB on 1/24. Intubated 1/26-1/31. EFY:unajrrn abuse, positive tuberculosis test as a young woman, hypertension, hyperlipidemia, heart murmur, DM II, and anxiety.    PT Comments  The pt needs encouragement to participate at this time as she reports I feel sick, even though premedicated for nausea. Educated pt on importance of progressing mobility and even just sitting upright to improve pulmonary and gut health. She demonstrated poor initiation with mobility due to lethargy after HD earlier today, thereby needing total assist for all bed mobility. She did sit EOB ~5 min but then declined to sit longer, insisting on laying back down due to fatigue. When sitting EOB she relied heavily on the therapist to support her. Extended period of time spent rolling pt for pericare and linen change during session due to flexiseal leaking. RN notified. Will continue to follow acutely.     If plan is discharge home, recommend the following: Two people to help with walking and/or transfers;Two people to help with bathing/dressing/bathroom;Assistance with cooking/housework;Assistance with feeding;Direct supervision/assist for medications management;Direct supervision/assist for financial management;Assist for transportation;Help with stairs or ramp for entrance;Supervision due to cognitive status   Can travel by private  vehicle        Equipment Recommendations  Wheelchair (measurements PT);Wheelchair cushion (measurements PT);BSC/3in1;Hospital bed;Hoyer lift;Rolling walker (2 wheels) (TBD further as pt progresses)    Recommendations for Other Services Rehab consult     Precautions / Restrictions Precautions Precautions: Other (comment);Fall Recall of Precautions/Restrictions: Impaired Precaution/Restrictions Comments: chest tube, TPN, flexiseal, foley Restrictions Weight Bearing Restrictions Per Provider Order: No     Mobility  Bed Mobility Overal bed mobility: Needs Assistance Bed Mobility: Rolling, Supine to Sit, Sit to Supine Rolling: Total assist   Supine to sit: Total assist, HOB elevated Sit to supine: Total assist, HOB elevated   General bed mobility comments: Pt demonstrating poor initiation to assist with bed mobility, only following simple cues intermittently. Pt not assisting >/= 25% and thereby needing total assist for supine <> sit R EOB and to roll bil for pericare and linen change.    Transfers                   General transfer comment: deferred due to lethargy and pt declining    Ambulation/Gait               General Gait Details: unable to attempt this date   Stairs             Wheelchair Mobility     Tilt Bed    Modified Rankin (Stroke Patients Only)       Balance Overall balance assessment: Needs assistance Sitting-balance support: Single extremity supported, Bilateral upper extremity supported, Feet supported Sitting balance-Leahy Scale: Poor Sitting balance - Comments: Pt often leaning against therapist to support her maxA when sitting statically EOB, brief moments of only minA though. Pt sat EOB a total of ~5 min before insisting on laying back down  Standing balance comment: deferred                            Communication Communication Communication: Impaired Factors Affecting Communication: Reduced  clarity of speech;Difficulty expressing self;Other (comment) (perseverates on ok bye and just leave me)  Cognition Arousal: Lethargic Behavior During Therapy: Flat affect (irritable)   PT - Cognitive impairments: Difficult to assess, Attention, Initiation, Sequencing, Problem solving, Safety/Judgement, Awareness, Memory                       PT - Cognition Comments: Pt with limited poor initiation and not following simple cues, unsure if due to impaired cognition or more behavioral. Pt had moments where she was resistive to work with therapist and needed encouragement to participate. Pt perseverating on stating ok bye or leave me alone while sitting EOB, seemingly with poor awareness that if PT did leave she would likely fall and get injured, demonstrating poor safety awareness. Poor awareness of need for pericare as flexiseal leaked, needing encouragement to roll to clean skin and prevent skin breakdown. Pt did eventually state You're so good to me, thank you at end of session. Following commands: Impaired Following commands impaired: Follows one step commands inconsistently, Follows one step commands with increased time    Cueing Cueing Techniques: Verbal cues, Tactile cues, Gestural cues, Visual cues  Exercises      General Comments General comments (skin integrity, edema, etc.): VSS on 1L [place]      Pertinent Vitals/Pain Pain Assessment Pain Assessment: Faces Faces Pain Scale: Hurts little more Pain Location: pt moaning with pericare Pain Descriptors / Indicators: Discomfort, Moaning, Grimacing, Guarding, Sore Pain Intervention(s): Monitored during session, Limited activity within patient's tolerance, Repositioned    Home Living                          Prior Function            PT Goals (current goals can now be found in the care plan section) Acute Rehab PT Goals Patient Stated Goal: to feel better PT Goal Formulation: With patient/family Time For  Goal Achievement: 09/11/24 Potential to Achieve Goals: Fair Progress towards PT goals: Progressing toward goals (minimally and slowly)    Frequency    Min 3X/week      PT Plan      Co-evaluation              AM-PAC PT 6 Clicks Mobility   Outcome Measure  Help needed turning from your back to your side while in a flat bed without using bedrails?: Total Help needed moving from lying on your back to sitting on the side of a flat bed without using bedrails?: Total Help needed moving to and from a bed to a chair (including a wheelchair)?: Total Help needed standing up from a chair using your arms (e.g., wheelchair or bedside chair)?: Total Help needed to walk in hospital room?: Total Help needed climbing 3-5 steps with a railing? : Total 6 Click Score: 6    End of Session Equipment Utilized During Treatment: Oxygen Activity Tolerance: Patient limited by lethargy Patient left: in bed;with call bell/phone within reach;with bed alarm set;with family/visitor present Nurse Communication: Mobility status;Other (comment) (flexiseal leaking) PT Visit Diagnosis: Unsteadiness on feet (R26.81);Muscle weakness (generalized) (M62.81);Difficulty in walking, not elsewhere classified (R26.2);Pain     Time: 8569-8491 PT Time Calculation (min) (ACUTE ONLY): 38  min  Charges:    $Therapeutic Activity: 38-52 mins PT General Charges $$ ACUTE PT VISIT: 1 Visit                     Theo Ferretti, PT, DPT Acute Rehabilitation Services  Office: [phone]   Theo CHRISTELLA Ferretti 09/02/2024, 4:42 PM

## 2024-09-02 NOTE — Progress Notes (Signed)
 17 Days Post-Op Procedures (LRB): EGD (ESOPHAGOGASTRODUODENOSCOPY) (N/A) CONTROL OF HEMORRHAGE, GI TRACT, ENDOSCOPIC, BY CLIPPING OR OVERSEWING Subjective: Does not feel well, back pain remains an issue  Objective: Vital signs in last 24 hours: Temp:  [97.3 F (36.3 C)-99.1 F (37.3 C)] 99 F (37.2 C) (02/05 0700) Pulse Rate:  [74-93] 89 (02/05 0700) Cardiac Rhythm: Normal sinus rhythm (02/05 0400) Resp:  [0-29] 19 (02/05 0700) BP: (138-151)/(43-54) 151/54 (02/04 1632) SpO2:  [87 %-97 %] 89 % (02/05 0804) Arterial Line BP: (130-154)/(40-54) 148/46 (02/05 0700) Weight:  [82.4 kg] 82.4 kg (02/05 0500)  Hemodynamic parameters for last 24 hours: CVP:  [0 mmHg-45 mmHg] 9 mmHg  Intake/Output from previous day: 02/04 0701 - 02/05 0700 In: 3558 [P.O.:1330; I.V.:1823; IV Piggyback:405] Out: 2030 [Urine:1770; Stool:200; Chest Tube:60] Intake/Output this shift: Total I/O In: 76.8 [I.V.:76.8] Out: 175 [Urine:175]  General appearance: alert, cooperative, and no distress Neurologic: intact Heart: regular rate and rhythm Lungs: diminished breath sounds bibasilar Abdomen: normal findings: soft, non-tender  Lab Results: Recent Labs    08/31/24 1010 09/01/24 1026  WBC 32.6* 26.6*  HGB 8.1* 8.1*  HCT 23.1* 21.0*  PLT 177 163   BMET:  Recent Labs    09/01/24 0356 09/02/24 0329  NA 132* 130*  K 3.8 3.4*  CL 97* 94*  CO2 17* 18*  GLUCOSE 131* 136*  BUN 96* 101*  CREATININE 2.22* 2.64*  CALCIUM 8.0* 7.9*    PT/INR: No results for input(s): LABPROT, INR in the last 72 hours. ABG    Component Value Date/Time   PHART 7.297 (L) 08/26/2024 0735   HCO3 19.2 (L) 08/26/2024 0735   TCO2 20 (L) 08/26/2024 0735   ACIDBASEDEF 7.0 (H) 08/26/2024 0735   O2SAT 93 08/26/2024 0735   CBG (last 3)  Recent Labs    09/01/24 2326 09/02/24 0352 09/02/24 0802  GLUCAP 118* 135* 165*    Assessment/Plan: S/P Procedures (LRB): EGD (ESOPHAGOGASTRODUODENOSCOPY) (N/A) CONTROL OF  HEMORRHAGE, GI TRACT, ENDOSCOPIC, BY CLIPPING OR OVERSEWING - NEURO- intact CV- in SR, CVP 9 Hypertension- remains extremely difficult to control  CCM to assess possible underlying causes-  RESP- sat Ok on 1L [place]   Report of coughing with PO intake  Patient denies- will have speech reassess RENAL- nonoliguric ARF  For HD today- filtration, no volume removal ENDO- CBG mildly elevated GI- taking limited POs  On TNA for nutritional support  If aspirating will need GJ tube ID- on Unasyn / micafungin  Deconditioning- severe   LOS: 38 days    [name] 09/02/2024

## 2024-09-02 NOTE — Progress Notes (Signed)
 PHARMACY - TOTAL PARENTERAL NUTRITION CONSULT NOTE  Indication: Esophageal leak  Patient Measurements: Height: 5' 2.99 (160 cm) Weight: 82.4 kg (181 lb 10.5 oz) IBW/kg (Calculated) : 52.38 TPN AdjBW (KG): 55.8 Body mass index is 32.19 kg/m.  Assessment:  40 YOF with NSCLC here for RML lobectomy, resection of esophageal diverticulum, and lymph node dissection on 12/29.  Found to have a small contained leak post-op.   Patient reports an intentional weight loss of 40 lbs over a couple of years when she was diagnosed with DM.  She typically drinks one [place] shake every morning for breakfast and eats one meal a day, which could be pizza, spaghetti, chicken, beef or fish along with vegetables and fruits.  Patient was eating normally until her planned surgery. RD concerned with patient being malnourished. Patient was on TPN 12/31 - 1/12 and was advanced to dysphagia 1 diet. However, she was NPO again 1/14-1/16. On 1/17 IR reported small amount of leak from distal esophageal stent. Patient developed AKI since TPN was held. Pharmacy reconsulted for TPN 1/17.  Patient intubated overnight on 1/27. Currently on propofol  and cleviprex . Trial of nicardipine  on 1/26 without adequate BP control. Requiring propofol  for sedation, potential extubation on 1/30 so team would like to avoid versed . Precedex  likely with inadequate effect for full wean. Potential for Jtube / PEG at the end of the week per TCTS.   1/30: CRRT started yesterday 1/29, glc control improving. TG noted.  1/31: TG down after propofol  stopped >> changed to precedex , clevi down to 2 mg/hr, reglan  stopped 1/30  2/3 Off CRRT, diuresing w/ K supplementation. Remains on clevidipine  at high dose.  2/4 Patient able to swallow and started on clear liquids yesterday. Coreg  started yesterday but remains on high doses of clevidipine .   Glucose / Insulin : hx DM, A1c 5.9% - BG 116-155, 16 units SSI/24h (no glargine) with 8u insulin  in TPN (previously  20u, was stopped when TPN was adjusted for cleviprex )   Electrolytes: Na 130, K 3.4, CO2 18 Phos 7.0 (none in TPN, was removed 2/3), CoCa: 8.9 Renal: AKI, CRRT stopped 2/2 AM. Considering iHD but currently diuresing and may have renal recovery. SCr 2.64 (BL 0.8), BUN 101 Hepatic: LFTs WNL, tbili WNL, albumin  2.8, TG 213 1/31 (noted clevidipine  added on 1/26)  Intake / Output; MIVF: UOP 0.9 mL/kg/hr (on lasix  80 daily), LBM 2/5 diarrhea, chest tube 60 mL Net IO Since Admission: 24,406.64 mL [09/02/24 0939]  GI Imaging:   12/30 esophagram: Positive for contrast extravasation and an esophageal leak in the mid esophagus 1/2 esophagram: Persistent contrast extravasation and esophageal leak, similar to prior 1/6 esophagram: Limited contrast study due to patient aspiration and severe coughing spell. Study aborted. 1/12 esophagram: Patent esophageal stent. No evidence for extravasation or leak, Delayed esophageal emptying and gastroesophageal reflux leading to stasis of contrast in the esophagus. 1/15 CT: displaced esophageal stent in stomach, no bowel obstruction  1/17 esophagram: small volume esophageal leak at distal stent 1/20 esophagram: no evidence of any leak  1/28 KUB: Nonobstructive bowel gas pattern  1/29 KUB: bowel gas pattern is normal 1/30 KUB: Nonobstructive bowel gas pattern 2/3 esophagram no leak, mild esophageal dysmotility, stent in place  GI Surgeries / Procedures:  12/30 right middle lobectomy, resection esophageal diverticulum, lymph node biopsy  1/7 EGD- stent placement  1/9 EGD stent repositioned d/t migration to stomach  1/16 EGD- stent placed at esophageal ulcer, removed gastric stent, normal duoenum  1/19 EGD - stent replaced / repositioned  Central access: PICC 07/28/24 TPN start date: 07/28/24 > 1/12; 1/17 >>   Nutritional Goals:  Goal unconcentrated TPN rate 75 ml/hr to provide 101g AA and 1721 kCal Goal concentrated TPN rate 60 mL/hr to provide 101 g AA and 1743  kCal  Temporary goal off CRRT while on high dose clevidipine  will be 35 ml/hr to provide ~ 697 kcal and 1.2 g/kg AA (67.2 g), and 126 g dextrose . This provides 1847 kcal/day. Hopeful to wean clevidipine  off, and increase dextrose  / kcal provision from TPN soon.  RD Estimated Needs Total Energy Estimated Needs: 1700-1900 Total Protein Estimated Needs: 85-105 g Total Fluid Estimated Needs: >1.7L/day  Current Nutrition:  TPN 1/17 >> 1/08 CLD - passed swallow 1/09 CLD - patient feels can't swallow well > NPO 1/13 advanced to DYS1 1/14 NPO  1/21 FLD, Glucerna x1 on 1/21, milkshake per patient  1/24 NPO  1/31 ~05:00 TPN pump alerting for air-in-line though RN could not find any issue with it. Switched to D10 for 12 hours  2/3 CLD 2/4 FLD  Plan: Continue TPN without lipids to 35 ml/hr, provides 67 g protein and 697 kcals, meeting % goals with cleviprex    Cleviprex  infusing at 42 mL/hr at 2 kcal/ml, = ~2000 kcal per day at this rate  Electrolytes in TPN: Na 120 mEq/L, K 20 mEq/L, Ca 8 mEq/L, Mg 8 mEq/L, Phos 0 mmol/L. Cl:Ac 1:2 Continue standard MVI and trace elements to TPN Continue resistant SSI to q4h and adjust as needed Increase to 12 units of insulin  in TPN Monitor TPN labs on Mon/Thurs, and daily until stable On lasix  80 mg IV daily , 40meq PO K given by primary team this AM Plan for iHD today per nephro  Thank you for allowing pharmacy to be a part of this patients care.  Sharyne Glatter, PharmD, BCCCP Critical Care Clinical Pharmacist 09/02/2024 9:39 AM

## 2024-09-02 NOTE — Plan of Care (Signed)
" °  Problem: Neurological: Goal: Will regain or maintain usual level of consciousness Outcome: Progressing   Problem: Respiratory: Goal: Will regain and/or maintain adequate ventilation Outcome: Progressing   Problem: Skin Integrity: Goal: Risk for impaired skin integrity will decrease Outcome: Not Progressing   "

## 2024-09-02 NOTE — Progress Notes (Addendum)
 Nutrition Follow-up  DOCUMENTATION CODES:   Severe malnutrition in context of acute illness/injury  INTERVENTION:   Agree with plan for G-J consideration at this time. This will allow for consistent enteral route for adequate nutrition and allow ability to wean from TPN  Recommend continuing TPN  Cleviprex  gtt continues to provide significant amount of lipid calories. Discussed with PCCM and team adjusting meds today in hopes to wean Cleviprex  No enteral access so unable to transition from TPN to EN TPN currently providing less protein than recommended; reduction in protein did not improve azotemia. However, favor reduction of protein in effort to increase carbohydrate delivery until able to wean off Cleviprex  gtt  Recommend addition of Renal MVI daily once able to address water soluble losses with RRT  Tube Feeding Recommendations for G-J (if placed): Vital AF 1.2 at 60 ml/hr Begin at low rate of 20 ml/hr with slow titration to goal rate.  TF goal provides 1728 kcals, 108 g of protein and 1166 mL of free water    NUTRITION DIAGNOSIS:   Severe Malnutrition related to acute illness as evidenced by moderate muscle depletion, energy intake < or equal to 50% for > or equal to 5 days.  Continues but being addressed  GOAL:   Patient will meet greater than or equal to 90% of their needs  Being addressed via TPN  MONITOR:   Diet advancement, Supplement acceptance, PO intake, Weight trends  REASON FOR ASSESSMENT:   Consult New TPN/TNA  ASSESSMENT:   Pt with PMH significant for: T2DM, HLD, HTN, sleep apnea, anxiety and depression. 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 Now s/p right middle lobectomy and resection of esophageal diverticulum.  12/29 admitted; OR: middle lobectomy and resection of esophageal diverticulum 12/30 esophagram: small contained esophageal leak 12/31 PICC placed; TPN started 1/06 aspirated contrast -  esophagram unable to be completed 1/07 esophageal stent placed; TPN at goal 1/08 diet adv CLD  1/09 reports of excessive coughing; esophageal stent found to be in wrong position 1/10 adjustment to esophageal stent 1/12 repeat esophagram; diet advanced to clear liquid diet 1/13 diet advanced DYS1/thins - increased coughing 1/14 NPO; esophageal stent migration 1/15 CT: displaced esophageal stent, small R pleural effusion 1/16 EGD w/ stent replacement, bronch 1/17 TPN restarted 1/20 EGD and Esophogram with no signs of leak, diet advanced to CL 1/21 Diet adv to FL, +nausea 1/22 Diet adv to Regular 1/24 Back to strict NPO, worsening respiratory status, concern for aspiration  1/26 Intubated 1/27 Bronch- airway defect 1/28 Cortrak tube placed by Dr. Daniel using serial xrays (not Cortrak technology), tip gastric 1/30 Reglan   1/31 Extubated, TPN interruption,  2/01 CRRT discontinued, C.diff negative, CT C/A/P with severe gastritis and duodenitis 2/03 Esophogram with mild esophageal dysmotility and no evidence of leak (Small volume contrast does track around the outside of the stent, greater left than right. No contrast extravasation outside of the esophageal lumen  Cleviprex  at max rate (42 ml/hr) providing around 2000 kcals via lipids in 24 hours period. Noted plan to repeat TG this AM TPN continues at rate of 35 ml/hr  Receiving iHD on visit today, pt lethargic after receiving ativan  this AM Making urine with diuresis, 1.8 L in 24 hours  Diet advanced to FL, per MD notes-pt coughing with po intake, SLP reassessed today. Plan for CL Diet, allow meds with water or in puree, with strict aspiration precautions  Per chart review, it appears that pt's aspiration events  have occurred with refluxing of liquids.   Noted Cortrak tube (placed by Dr. Daniel) has been removed. No enteral access at present  +diarrhea, FMS inserted on 1/31, C.diff negative yesterday  No pressure injuries or skin breakdown  per RN skin assessment   TPN Prescription Hx: Receiving standard MVI and Trace Minerals 1/17 TPN restarted 1/26 TPN at concentrated goal of 60 ml/hr,  1/27 TPN at 40 ml/hr, 100 g of protein, 118 g of Carbs, Cleviprex  restarted 1/28 TPN at 30 ml/hr, 100 g of protein, 30 g Carbs 1/29 TPN at 30 ml/hr, 110 g of protein, 148 g Carbs 1/30 TPN at 45 mlhr, 110 g of protein, 151 g Carbs 1/31 TPN at 45 ml/hr, 110 g of protein, 216 g carbs 2/01 TPN at 35 ml/hr, 111 g of protein, 46 g of Carbs 2/02 TPN at 35 ml/hr, 111 g of protein, 46 g of Carbs, min sterile water 2/03 TPN at 35 ml/hr,  67 g of protein, 46 g of Carbs, increased sterile water 2/04 TPN at 35 ml/hr, 67 g of protein, 126 g of Dextrose  2/05 TPN at 35 ml/hr providing 67 g of protein, 126 g of Dextrose    Current Weight: 82.4 kg, trending back up off CRRT. 76.2 kg > 81.1 kg > 82.4 kg Admission Weight: 70.3 kg Estimated Dry Weight: unknown at present; edema noted on exam    Labs: CBGs 118-165 Sodium 130 (L) Potassium 3.4 (L) BUN 101 (H) Creatinine 2.64 Phosphorus 7.0 (H) Magnesium  1.9 (wdl)  Meds SS novolog  IV protonix  BID Lasix  80 mg daily SS Novolog    Diet Order:   Diet Order             Diet full liquid Room service appropriate? Yes; Fluid consistency: Thin  Diet effective now                   EDUCATION NEEDS:   Education needs have been addressed  Skin:  Skin Assessment: Reviewed RN Assessment  Last BM:  2/5  Height:   Ht Readings from Last 1 Encounters:  08/27/24 5' 2.99 (1.6 m)    Weight:   Wt Readings from Last 1 Encounters:  09/02/24 82.4 kg    BMI:  Body mass index is 32.19 kg/m.  Estimated Nutritional Needs:   Kcal:  1700-1900  Protein:  85-105 g  Fluid:  >1.7L/day   Betsey Finger MS, RDN, LDN, CNSC Registered Dietitian 3 Clinical Nutrition RD Inpatient Contact Info in Amion

## 2024-09-02 NOTE — Progress Notes (Addendum)
 SLP Cancellation Note  Patient Details Name: [name] MRN: [id number] DOB: 17-Jun-1956   Cancelled treatment:       Reason Eval/Treat Not Completed: Patient's level of consciousness. Pt is too lethargic for adequate assessment of swallowing today.  Lengthy review of chart since prior consult - see history below. Pt has a pattern of increased coughing when diet advanced past sips of clears. SLP reviewed most recent esophagram which did not show any laryngeal aspiration, though there is concern for an esophageal dysmotility and risk of reflux and contrast is seen around exterior of stent, which could also trigger come coughing. Pt has repeatedly not tolerated adequate oral nutrition and suspect this will be an ongoing pattern. SLP will continue to treat patient for safe oral intake, though expect significant restriction on quantity and quality to reduce distressful coughing and risk of reflux. Suggested to provider that reflux of full liquid diet is a higher risk of postprandial aspiration. Consider holding diet at clears, meds with water or whole in puree. Pt must be fully upright and stay upright after sips.  Support consideration for G/J tube.   History Pertinent to Swallowing:   Pt presented for surgery r/t diagnosis of non-small cell carcinoma of her right middle lobe. S/P right middle lobectomy and resection of esophageal diverticulum on 12/29. 12/30 esophagram: Positive for contrast extravasation and an esophageal leak in the mid esophagus; 1/2 esophagram: Persistent contrast extravasation and esophageal leak, similar to prior 1/6 esophagram: SLP consulted due to trace aspiration on esophagram, though there is a persistent leak leading to entrance of barium into bronchi and being expectorated. 1/7 - first esophageal stent placed (no esophagram performed to confirm); 1/8 - SLP consulted due to coughing- MBS shows ongoing leak and no stent in place. 1/9 malpositioned stent moved back into place 1/12  esophagram: Patent esophageal stent. No evidence for extravasation or leak, Delayed esophageal emptying and gastroesophageal reflux leading to stasis of contrast in the esophagus. Pt started on puree/thin diet. Started coughing on 1/14, made NPO. 1/15 CT: displaced esophageal stent in stomach. Initial diagnosis of aspiration pna. GI consulted and reports mild narrowing of the esophagus proximal to the stent. Critical care reports CT personally reviewed> possible BE fistula vs redundant esophagus Bronchoscopy confirms. 1/16 esophageal stent #2 placed, first stent retrieved from stomach. GI warns stent could migrate again. 1/17 esophagram: small volume esophageal leak at distal stent. 1/19 - Stent #2 removed and stent #3 placed by GI with clips. 1/20 esophagram: no evidence of any leak. Clears resumed. 1/21 no complaints with swallowing, tolerating clears., advanced to fulls, pt reports nausea. 1/23 Dr Gretta reports Poor PO intake, gets full easily. No increase in coughing when eating. 1/23 diet advanced to regular/thin. 1/24 pt had respiratory distress, made NPO. concern for aspiration due to reflux. 1/26 resumed clears. Intubated 1/27-1/31 Bronch shows BAL is mixed cell type with surprisingly low PMN counts and very low eosinophils. Possible cyptogenic Organizing Pneumonia.. Esophagram 2/3 shows Upper thoracic esophageal stent is similar in position. Small volume contrast does track around the outside of the stent, greater left than right. No contrast extravasation outside of the esophageal lumen. Esophageal motility: Mild dysmotility with stent in place. SLP personally reviewed study, no laryngeal aspiration seen. 2/3 started clears. 2/4 fulls. 2/5 pt reports coughing with all intake. consideration for GJ tube if aspirating.   Takeru Bose, Consuelo Fitch 09/02/2024, 1:09 PM

## 2024-09-03 ENCOUNTER — Inpatient Hospital Stay (HOSPITAL_COMMUNITY)

## 2024-09-03 DIAGNOSIS — I150 Renovascular hypertension: Secondary | ICD-10-CM

## 2024-09-03 LAB — RENAL FUNCTION PANEL
Albumin: 2.5 g/dL — ABNORMAL LOW (ref 3.5–5.0)
Anion gap: 12 (ref 5–15)
BUN: 54 mg/dL — ABNORMAL HIGH (ref 8–23)
CO2: 26 mmol/L (ref 22–32)
Calcium: 7.6 mg/dL — ABNORMAL LOW (ref 8.9–10.3)
Chloride: 96 mmol/L — ABNORMAL LOW (ref 98–111)
Creatinine, Ser: 1.84 mg/dL — ABNORMAL HIGH (ref 0.44–1.00)
GFR, Estimated: 30 mL/min — ABNORMAL LOW
Glucose, Bld: 129 mg/dL — ABNORMAL HIGH (ref 70–99)
Phosphorus: 4.5 mg/dL (ref 2.5–4.6)
Potassium: 3.1 mmol/L — ABNORMAL LOW (ref 3.5–5.1)
Sodium: 134 mmol/L — ABNORMAL LOW (ref 135–145)

## 2024-09-03 LAB — HEPATITIS B SURFACE ANTIBODY,QUALITATIVE: Hep B S Ab: NONREACTIVE

## 2024-09-03 LAB — CBC
HCT: 18.8 % — ABNORMAL LOW (ref 36.0–46.0)
Hemoglobin: 6.6 g/dL — CL (ref 12.0–15.0)
MCH: 32.7 pg (ref 26.0–34.0)
MCHC: 35.1 g/dL (ref 30.0–36.0)
MCV: 93.1 fL (ref 80.0–100.0)
Platelets: 126 10*3/uL — ABNORMAL LOW (ref 150–400)
RBC: 2.02 MIL/uL — ABNORMAL LOW (ref 3.87–5.11)
RDW: 16.9 % — ABNORMAL HIGH (ref 11.5–15.5)
WBC: 19.2 10*3/uL — ABNORMAL HIGH (ref 4.0–10.5)
nRBC: 0.2 % (ref 0.0–0.2)

## 2024-09-03 LAB — TYPE AND SCREEN
ABO/RH(D): A POS
Antibody Screen: NEGATIVE
Unit division: 0

## 2024-09-03 LAB — GLUCOSE, CAPILLARY
Glucose-Capillary: 101 mg/dL — ABNORMAL HIGH (ref 70–99)
Glucose-Capillary: 132 mg/dL — ABNORMAL HIGH (ref 70–99)
Glucose-Capillary: 107 mg/dL — ABNORMAL HIGH (ref 70–99)
Glucose-Capillary: 132 mg/dL — ABNORMAL HIGH (ref 70–99)
Glucose-Capillary: 73 mg/dL (ref 70–99)
Glucose-Capillary: 97 mg/dL (ref 70–99)

## 2024-09-03 LAB — BPAM RBC
Blood Product Expiration Date: 202602182359
ISSUE DATE / TIME: 202602060828
Unit Type and Rh: 6200

## 2024-09-03 LAB — TSH: TSH: 7.34 u[IU]/mL — ABNORMAL HIGH (ref 0.350–4.500)

## 2024-09-03 LAB — CORTISOL: Cortisol, Plasma: 18.7 ug/dL

## 2024-09-03 LAB — PREPARE RBC (CROSSMATCH)

## 2024-09-03 LAB — HEMOGLOBIN AND HEMATOCRIT, BLOOD
HCT: 20.9 % — ABNORMAL LOW (ref 36.0–46.0)
Hemoglobin: 7 g/dL — ABNORMAL LOW (ref 12.0–15.0)

## 2024-09-03 LAB — TRIGLYCERIDES: Triglycerides: 314 mg/dL — ABNORMAL HIGH

## 2024-09-03 LAB — MAGNESIUM: Magnesium: 1.7 mg/dL (ref 1.7–2.4)

## 2024-09-03 MED ORDER — CHLORTHALIDONE 25 MG PO TABS
25.0000 mg | ORAL_TABLET | Freq: Every day | ORAL | Status: AC
  Administered 2024-09-03: 25 mg via ORAL
  Filled 2024-09-03: qty 1

## 2024-09-03 MED ORDER — FLUCONAZOLE 200 MG PO TABS: 200.0000 mg | ORAL_TABLET | Freq: Every day | ORAL | Status: AC

## 2024-09-03 MED ORDER — FUROSEMIDE 10 MG/ML IJ SOLN
80.0000 mg | Freq: Two times a day (BID) | INTRAMUSCULAR | Status: AC
  Administered 2024-09-03: 80 mg via INTRAVENOUS
  Filled 2024-09-03: qty 8

## 2024-09-03 MED ORDER — SODIUM CHLORIDE 0.9% IV SOLUTION: Freq: Once | INTRAVENOUS | Status: AC

## 2024-09-03 MED ORDER — PIPERACILLIN-TAZOBACTAM IN DEX 2-0.25 GM/50ML IV SOLN
2.2500 g | Freq: Three times a day (TID) | INTRAVENOUS | Status: DC
  Administered 2024-09-03: 2.25 g via INTRAVENOUS
  Filled 2024-09-03 (×3): qty 50

## 2024-09-03 MED ORDER — POTASSIUM CHLORIDE 10 MEQ/100ML IV SOLN
10.0000 meq | INTRAVENOUS | Status: AC
  Administered 2024-09-03 (×2): 10 meq via INTRAVENOUS
  Filled 2024-09-03 (×2): qty 100

## 2024-09-03 MED ORDER — TRACE MINERALS CU-MN-SE-ZN 300-55-60-3000 MCG/ML IV SOLN
INTRAVENOUS | Status: AC
  Filled 2024-09-03: qty 448

## 2024-09-03 MED ORDER — PIPERACILLIN-TAZOBACTAM 3.375 G IVPB
3.3750 g | Freq: Three times a day (TID) | INTRAVENOUS | Status: AC
  Administered 2024-09-03: 3.375 g via INTRAVENOUS
  Filled 2024-09-03: qty 50

## 2024-09-03 MED ORDER — MAGNESIUM SULFATE 2 GM/50ML IV SOLN
2.0000 g | Freq: Once | INTRAVENOUS | Status: AC
  Administered 2024-09-03: 2 g via INTRAVENOUS
  Filled 2024-09-03: qty 50

## 2024-09-03 NOTE — Progress Notes (Signed)
 "  NAME:  Emonnie Turner, MRN:  [id number], DOB:  09-09-1955, LOS: 39 ADMISSION DATE:  07/26/2024, CONSULTATION DATE:  1/15 REFERRING MD:  Kerrin, CHIEF COMPLAINT:  sepsis   History of Present Illness:  Ms. [name] is a 69 y/o woman with a history of tobacco abuse and lung adenocarcinoma status post RML lobectomy on 12/29.  At the time of surgery was a large cyst that was resected. Despite oversewing it time resection she later developed an esophageal leak.  She required an esophageal stent which she has had recurrent problems with related to positioning/migration of the stent (necessitating multiple EGDs for stent revision). From 1/15 through 1/18, the patient was consistently total body balance positive in setting of receiving NS @ 100 mL/hr and then starting TPN as of 1/18. Due to concomitant AKI, developed volume overload and Turner edema and hypertensive urgency ultimately requiring a Cardene  drip be started overnight 1/19. Also on 1/19, she underwent revision of the position of her esophageal stent to address a small esophageal leak.  Pertinent  Medical History  Lung adenocarcinoma Latent  TB, treated in 1980 HTN HLD DM OSA Tobacco abuse  Significant Hospital Events: Including procedures, antibiotic start and stop dates in addition to other pertinent events   12/29 RML lobectomy 1/7 EGD with esophageal stent 1/9 EGD  with stent reposition after migration to stomach 1/15 vanc added, zosyn  restarted. CT  1/19: EGD for stent repositioning due to ongoing small esophageal leak / stent migration 1/20: Gastrograffin study -> no esophageal leak. Started on clear liquid diet. 1/21: Full liquid diet. Diuresed. 1/23 rapid rise in oxygen requirements, back to NPO 1/24 CT with multilobar crazy paving, remained on NRB. Added linzolid 1/25 blood cultures drawn, changed zosyn > cefepime  + flagyl  1/26 intubated overnight 1/27 bronch with BAL, broadened antifungal coverage 1/28 1 unit pRBC, weaned  FiO2, cortrak placed by Dr Daniel 1/30 still awaiting fungal results.  Still with significant positive fluid balance but does not look vascularly overloaded.  Chest x-ray does look a little bit better.  Respiratory compliance looks good.  Heavily sedated, working on titrating sedation down 1/31 Extubated. 2/2 Stop CRRT. CT c/a/p, sent c diff which was neg. Cont unasyn  mica 2/3 stop vanc  2/4 concerns for ongoing aspiration, surgery consulted for possible G-J 2/5 IR ok with doing enteral tube, still on max clev  Interim History / Subjective:  No acute events Discussed feeding tube Remains on maximal cleviprex    Objective    Blood pressure (!) 151/53, pulse 91, temperature 99.3 F (37.4 C), temperature source Bladder, resp. rate (!) 23, height 5' 2.99 (1.6 m), weight 82.1 kg, last menstrual period 10/26/1998, SpO2 92%. CVP:  [1 mmHg-38 mmHg] 8 mmHg      Intake/Output Summary (Last 24 hours) at 09/03/2024 0918 Last data filed at 09/03/2024 0600 Gross per 24 hour  Intake 1914.6 ml  Output 1455 ml  Net 459.6 ml   Filed Weights   09/02/24 0500 09/02/24 0847 09/03/24 0433  Weight: 82.4 kg 82.4 kg 82.1 kg    General:  chronically ill-appearing F resting in bed, fatigued  HEENT: MM pink/moist Neuro: intermittently somnolent but awakens to voice, oriented  CV: s1s2 rrr, no m/r/g PULM:  diminished bilaterally without distress, rhonchi or wheezing  GI: soft, non-tender  Extremities: warm/dry, no edema    Resolved problem list  ARDS Assessment and Plan    Esophageal leak s/p esophageal stent c/b migration now s/p repositioning  -evaluated again by speech, continues to have some  fluid leak around the stent, but is not truly aspirating.  This leak causes continued coughing and discomfort, IR reviewed case again and are willing to place G-J -cont TPN -continue clear sips with meds    Acute resp failure w hypoxia, improving Lung ca s/p lobectomy  HCAP/aspiraiton Bronchial defect in BI   Hx tobacco use  -chest tubes out 2/5  -IS, mobility, sat greater than 92% -cont unasyn  and change micafungin  to diflucan , fungal cx negative   Sepsis CT c/a/p w ASD, duodenitis  Cdiff was r/o  Sepsis physiology resolved -had 9d flagyl , 8d cefepime , 7d linezolid   -has been extended course of abx -- zosyn  fluconazole , changed to cefepime  flagyl  linezolid  and mica 1/27. Changed to PO vanc and unasyn  + mica 2/2, transition mico to fluconazole  2/6 -continuing current abx (unasyn , micafungin )-no end date at current, consider transition to difulcan  -follow white count fever curve   HTN -wean clevi gtt SBP < 160 -currently what is keeping her in ICU - Increase Coreg  to 25 mg twice daily - Amlodipine  10 - Hydralazine  100mg  tid  - Clonidine  patch 0.3 mg, duragesic  patch  -consider Entresto as an option, discussing with pharmacy and nephrology  Nonoliguric AKI  -nephro following - SCr 1.7> 2.22.  BUN 71> 96 - Lasix  80 mg daily - HD prn  Anemia  -critical dz -follow CBC, transfuse for goal > 7  -hgb drift to 6.6, transfused 1 unit PRBC's  Hyperglycemia  -SSI  Deconditioning Severe protein energy malnutrition -cont TPN  -PT, OT   Possible hypoactive ICU delirium -delirium precautions - Oriented  CC time: 35 minutes   CRITICAL CARE Performed by: [name]   Total critical care time: 35 minutes  Critical care time was exclusive of separately billable procedures and treating other patients.  Critical care was necessary to treat or prevent imminent or life-threatening deterioration.  Critical care was time spent personally by me on the following activities: development of treatment plan with patient and/or surrogate as well as nursing, discussions with consultants, evaluation of patient's response to treatment, examination of patient, obtaining history from patient or surrogate, ordering and performing treatments and interventions, ordering and review of laboratory  studies, ordering and review of radiographic studies, pulse oximetry and re-evaluation of patient's condition.  Kelly SAUNDERS Kelly Mentel, PA-C [name] Critical care 7a-7p: For contact information, see AMION. If no response to pager, please call PCCM 2-H APP. After 7p: Please call PCCM APP on-call for 2-H.   "

## 2024-09-03 NOTE — Plan of Care (Signed)
" °  Problem: Cardiac: Goal: Ability to maintain an adequate cardiac output Outcome: Progressing Goal: Will show no evidence of cardiac arrhythmias Outcome: Progressing   Problem: Nutritional: Goal: Will attain and maintain optimal nutritional status Outcome: Progressing   Problem: Clinical Measurements: Goal: Ability to maintain clinical measurements within normal limits Outcome: Progressing   Problem: Coping: Goal: Level of anxiety will decrease Outcome: Not Progressing   "

## 2024-09-03 NOTE — Evaluation (Signed)
 Clinical/Bedside Swallow Evaluation Patient Details  Name: [name] MRN: [id number] Date of Birth: 1956/05/16  Today's Date: 09/03/2024 Time: SLP Start Time (ACUTE ONLY): 1436 SLP Stop Time (ACUTE ONLY): 1519 SLP Time Calculation (min) (ACUTE ONLY): 43 min  Past Medical History:  Past Medical History:  Diagnosis Date   Anxiety    Depression    Diabetes mellitus without complication (HCC)    Dysrhythmia    PVCs, SVT   Heart murmur    1981; when pt was pregnant   Hyperlipidemia    Hypertension    Sleep apnea    Tachycardia    Tuberculosis 1980   was treated; latent   Past Surgical History:  Past Surgical History:  Procedure Laterality Date   APPENDECTOMY     BRONCHIAL WASHINGS  08/13/2024   Procedure: IRRIGATION, BRONCHUS;  Surgeon: Kerrin Elspeth BROCKS, MD;  Location: [facility] ENDOSCOPY;  Service: Thoracic;;   CESAREAN SECTION     ENDOBRONCHIAL ULTRASOUND Bilateral 06/03/2024   Procedure: ENDOBRONCHIAL ULTRASOUND (EBUS);  Surgeon: Catherine Cools, MD;  Location: [facility] ENDOSCOPY;  Service: Pulmonary;  Laterality: Bilateral;   ESOPHAGEAL STENT PLACEMENT N/A 08/13/2024   Procedure: INSERTION, STENT, ESOPHAGUS;  Surgeon: Rollin Dover, MD;  Location: [facility] ENDOSCOPY;  Service: Gastroenterology;  Laterality: N/A;   ESOPHAGOGASTRODUODENOSCOPY N/A 08/04/2024   Procedure: EGD (ESOPHAGOGASTRODUODENOSCOPY) With X  WALLFLEX ESOPHAGEAL STENT PLACEMENT;  Surgeon: Shyrl Linnie KIDD, MD;  Location: [facility] OR;  Service: Thoracic;  Laterality: N/A;   ESOPHAGOGASTRODUODENOSCOPY N/A 08/06/2024   Procedure: EGD (ESOPHAGOGASTRODUODENOSCOPY);  Surgeon: Daniel Con RAMAN, MD;  Location: [facility] OR;  Service: Thoracic;  Laterality: N/A;   ESOPHAGOGASTRODUODENOSCOPY N/A 08/13/2024   Procedure: EGD (ESOPHAGOGASTRODUODENOSCOPY);  Surgeon: Rollin Dover, MD;  Location: [facility] ENDOSCOPY;  Service: Gastroenterology;  Laterality: N/A;  needs stent removal with placement of another esophageal stent   ESOPHAGOGASTRODUODENOSCOPY  N/A 08/16/2024   Procedure: EGD (ESOPHAGOGASTRODUODENOSCOPY);  Surgeon: Rollin Dover, MD;  Location: [facility] ENDOSCOPY;  Service: Gastroenterology;  Laterality: N/A;  esophageal stent reposition   FLEXIBLE BRONCHOSCOPY  08/13/2024   Procedure: BRONCHOSCOPY, FLEXIBLE;  Surgeon: Kerrin Elspeth BROCKS, MD;  Location: [facility] ENDOSCOPY;  Service: Thoracic;;   HEMOSTASIS CLIP PLACEMENT  08/13/2024   Procedure: CONTROL OF HEMORRHAGE, GI TRACT, ENDOSCOPIC, BY CLIPPING OR OVERSEWING;  Surgeon: Rollin Dover, MD;  Location: [facility] ENDOSCOPY;  Service: Gastroenterology;;   HEMOSTASIS CLIP PLACEMENT  08/16/2024   Procedure: CONTROL OF HEMORRHAGE, GI TRACT, ENDOSCOPIC, BY CLIPPING OR OVERSEWING;  Surgeon: Rollin Dover, MD;  Location: [facility] ENDOSCOPY;  Service: Gastroenterology;;   LOBECTOMY, LUNG, ROBOT-ASSISTED, USING VATS Right 07/26/2024   Procedure: ROBOT ASSISTED RIGHT MIDDLE LOBECTOMY; RESECTION OF ESOPHAGEAL DIVERTICULUM;  Surgeon: Kerrin Elspeth BROCKS, MD;  Location: [facility] OR;  Service: Thoracic;  Laterality: Right;  ROBOTIC RIGHT MIDDLE LOBECTOMY   LYMPH NODE BIOPSY  07/26/2024   Procedure: LYMPH NODE BIOPSY;  Surgeon: Kerrin Elspeth BROCKS, MD;  Location: [facility] OR;  Service: Thoracic;;   STENT REMOVAL  08/13/2024   Procedure: CLEDA REMOVAL;  Surgeon: Rollin Dover, MD;  Location: [facility] ENDOSCOPY;  Service: Gastroenterology;;   TONSILLECTOMY  1974   VIDEO BRONCHOSCOPY WITH ENDOBRONCHIAL NAVIGATION Right 06/03/2024   Procedure: VIDEO BRONCHOSCOPY WITH ENDOBRONCHIAL NAVIGATION;  Surgeon: Catherine Cools, MD;  Location: [facility] ENDOSCOPY;  Service: Pulmonary;  Laterality: Right;   HPI:  Pt presented for surgery r/t diagnosis of non-small cell carcinoma of her right middle lobe. S/P right middle lobectomy and resection of esophageal diverticulum on 12/29. 12/30 esophagram: Positive for contrast extravasation and an esophageal leak in the  mid esophagus; 1/2 esophagram: Persistent contrast extravasation and esophageal leak, similar to  prior 1/6 esophagram: SLp consulted due to trace aspiration on esophgram, though persistent leak leading to entrance of barium into bronchi and being expectorated. 1/7 - first esophageal stent placed (no esophagram performed to confirm); 1/8 - SLP consulted due to coughing- MBS shows ongoing leak and no stent in place. 1/9 malpositioned stent moved back into place 1/12 esophagram: Patent esophageal stent. No evidence for extravasation or leak, Delayed esophageal emptying and gastroesophageal reflux leading to stasis of contrast in the esophagus. Pt started on puree/thin diet. Started coughing on 1/14, made NPO.  1/15 CT: displaced esophageal stent in stomach. Initial diagnosis of aspiration pna. GI consulted and reports  mild narrowing of the esophagus proximal to the stent. Critical care reports CT personally reviewed> possible BE fistula vs redundant esophagus Bronchoscopy confirms. 1/16 esophageal stent #2 placed, first stent retrieved from stomach. GI warns stent could migrate again. 1/17 esophagram: small volume esophageal leak at distal stent. 1/19 - Stent #2 removed and stent #3 placed by GI with clips.   1/20 esophagram: no evidence of any leak. Clears resumed. 1/21 no complaints with swallowing, tolerating clears., advanced to fulls, pt reports nausea. 1/23 Dr Gretta reports Poor PO intake, gets full easily.  No increase in coughing when eating. 1/23 diet advanced to regular/thin. 1/24 pt had respiratory distress, made NPO. concern for aspiration due to reflux. 1/26 resumed clears. Intubated 1/27-1/31 Bronch shows BAL is mixed cell type with surprisingly low PMN counts and very low eosinophils. Possible cyptogenic Organizing Pneumonia.. Esophagram 2/3 shows Upper thoracic esophageal stent is similar in position. Small volume contrast does track around the outside of the stent, greater left than right. No contrast extravasation outside of the esophageal lumen. Esophageal motility: Mild dysmotility with  stent in place. SLP personally reviewed study, no laryngeal aspiration seen. 2/3 started clears. 2/4 fulls. 2/5 pt reports coughing with all intake. consideration for GJ tube if aspirating.    Assessment / Plan / Recommendation  Clinical Impression  Pt presents with poor awareness of diagnosis and recovery related to swallowing. Strongly suspect pt is coughing due to sensation of globus around stent and related esophageal movement.  Pt also at increased risk of reflux due to stent holding open esophagus and reflux observed on imaging.  Agree with MD and other providers that a G-J tube will give pt adequate nutrition with lower risk of reflux. Pt unlikely to tolerate large amount of oral nutrition with stent in place due to discomfort with swallow and reflux risk. Also recommend only giving clear liquids with strict reflux precautions at this time to reduce risk of postprandial aspiration of gastric and milk-based contents due to vulnerable lungs. SLP will f/u to reinforce and make adjustments to oral intake as appropriate.   SLP observed one immediate cough when pt drinking while reclined and one delayed cough when repositioned fully upright and drinking. Very low suspicion for immediate laryngeal aspiration events; pt has primarily delayed coughing and has not aspirated on recent esophagrams or prior MBS. Provided extensive education with pt and family including review of imaging, anatomy diagrams, clear written precautions and posted signage for family. Daughter and patient demonstrated understanding of clinical decision making process with appropriate follow up questions and demonstration of bed positioning.    SLP Visit Diagnosis: Dysphagia, unspecified (R13.10)    Aspiration Risk  Risk for inadequate nutrition/hydration;Moderate aspiration risk    Diet Recommendation  Other Recommendations Oral Care Recommendations: Oral care BID Caregiver Recommendations: Have oral suction  available     Swallow Evaluation Recommendations Recommendations: PO diet PO Diet Recommendation: Clear liquid diet Liquid Administration via: Straw Medication Administration: Whole meds with liquid Supervision: Set-up assistance for safety Swallowing strategies  : Slow rate;Small bites/sips Postural changes: Position pt fully upright for meals;Stay upright 30-60 min after meals Oral care recommendations: Oral care BID (2x/day) Caregiver Recommendations: Have oral suction available   Assistance Recommended at Discharge    Functional Status Assessment Patient has had a recent decline in their functional status and demonstrates the ability to make significant improvements in function in a reasonable and predictable amount of time.  Frequency and Duration min 2x/week  2 weeks       Prognosis Prognosis for improved oropharyngeal function: Good      Swallow Study   General HPI: Pt presented for surgery r/t diagnosis of non-small cell carcinoma of her right middle lobe. S/P right middle lobectomy and resection of esophageal diverticulum on 12/29. 12/30 esophagram: Positive for contrast extravasation and an esophageal leak in the mid esophagus; 1/2 esophagram: Persistent contrast extravasation and esophageal leak, similar to prior 1/6 esophagram: SLp consulted due to trace aspiration on esophgram, though persistent leak leading to entrance of barium into bronchi and being expectorated. 1/7 - first esophageal stent placed (no esophagram performed to confirm); 1/8 - SLP consulted due to coughing- MBS shows ongoing leak and no stent in place. 1/9 malpositioned stent moved back into place 1/12 esophagram: Patent esophageal stent. No evidence for extravasation or leak, Delayed esophageal emptying and gastroesophageal reflux leading to stasis of contrast in the esophagus. Pt started on puree/thin diet. Started coughing on 1/14, made NPO.  1/15 CT: displaced esophageal stent in stomach. Initial diagnosis  of aspiration pna. GI consulted and reports  mild narrowing of the esophagus proximal to the stent. Critical care reports CT personally reviewed> possible BE fistula vs redundant esophagus Bronchoscopy confirms. 1/16 esophageal stent #2 placed, first stent retrieved from stomach. GI warns stent could migrate again. 1/17 esophagram: small volume esophageal leak at distal stent. 1/19 - Stent #2 removed and stent #3 placed by GI with clips.   1/20 esophagram: no evidence of any leak. Clears resumed. 1/21 no complaints with swallowing, tolerating clears., advanced to fulls, pt reports nausea. 1/23 Dr Gretta reports Poor PO intake, gets full easily.  No increase in coughing when eating. 1/23 diet advanced to regular/thin. 1/24 pt had respiratory distress, made NPO. concern for aspiration due to reflux. 1/26 resumed clears. Intubated 1/27-1/31 Bronch shows BAL is mixed cell type with surprisingly low PMN counts and very low eosinophils. Possible cyptogenic Organizing Pneumonia.. Esophagram 2/3 shows Upper thoracic esophageal stent is similar in position. Small volume contrast does track around the outside of the stent, greater left than right. No contrast extravasation outside of the esophageal lumen. Esophageal motility: Mild dysmotility with stent in place. SLP personally reviewed study, no laryngeal aspiration seen. 2/3 started clears. 2/4 fulls. 2/5 pt reports coughing with all intake. consideration for GJ tube if aspirating. Type of Study: Bedside Swallow Evaluation Previous Swallow Assessment: MBS, esophagram see HPI Diet Prior to this Study: NPO Temperature Spikes Noted: No Respiratory Status: Room air Behavior/Cognition: Alert;Cooperative;Pleasant mood Oral Cavity Assessment: Within Functional Limits Oral Care Completed by SLP: No Oral Cavity - Dentition: Adequate natural dentition Vision: Functional for self-feeding Self-Feeding Abilities: Needs assist Patient Positioning: Upright in  bed Baseline Vocal Quality: Hoarse Volitional Cough: Congested Volitional Swallow:  Able to elicit    Oral/Motor/Sensory Function Overall Oral Motor/Sensory Function: Within functional limits   Ice Chips     Thin Liquid Thin Liquid: Impaired Presentation: Straw Pharyngeal  Phase Impairments: Cough - Delayed;Cough - Immediate    Nectar Thick Nectar Thick Liquid: Not tested   Honey Thick Honey Thick Liquid: Not tested   Puree Puree: Not tested   Solid     Solid: Not tested      Anett Ranker, Consuelo Fitch 09/03/2024,3:38 PM

## 2024-09-03 NOTE — Progress Notes (Signed)
" ° ° °  IR Note  Request made for Gastric - jejunal tube placement per Dr JONETTA Sharps Aspiration Speech recommendation per notes   Came to discuss with pt and Dtr in room Pt wants to talk with her husband before she talks to me at all. He is coming to see pt tomorrow per Dtr in room  Concerning: Temp today 99.7                      WBC 19.2--- trending down  Will recheck labs Kept npo for 2/9- in case pt wants to move ahead  Thank you "

## 2024-09-03 NOTE — Progress Notes (Signed)
 Hgb drift to 6.6  -Send T&S -1 PRBC -H/H post transfusion   K 3.1 Mag 1.7 -modest K replacement (2 runs) -- expect that above product will raise K some, but is also on TPN so would d/w day shift pharmD. May need add'l replacement  -defer Mag replacement for now pending day shift d/w pharmD re TPN contents     Ronnald Gave MSN, AGACNP-BC Amion for pager  09/03/2024, 5:56 AM

## 2024-09-03 NOTE — Progress Notes (Addendum)
 [facility] NEPHROLOGY PROGRESS NOTE  Assessment/ Plan: Pt is a 69 y.o. yo female    # Acute kidney Injury: Suspected to have ATN secondary to ischemic and nephrotoxic etiologies.  Serologies negative for GN including vasculitis/anti-GBM given previous concerns of gross hematuria.   -Started CRRT on 1/29 for azotemia.  Stopped CRRT on the morning of 2/1 -Had HD on 2/5 -> likely on 2/7 as well -Likely retaining a fair amount of fluid; increase lasix  to twice daily -Reassess HD needs daily given good UOP and signs of potential recovery -Strict ins and out, monitor labs.  # Esophageal leak following resection of esophageal diverticulum: Underwent placement of a new esophageal stent previous one migrated distally into the stomach.  EGD and esophagram without evidence of leak.   # Hypertensive urgency: Requiring Cleviprex  IV and extensive oral meds. Likely volume a driving factor. UF with HD and diuretics as above  # Anemia: Requiring blood transfusion.  Continue transfusions as needed per primary team.  Consider ESA  # Acute hypoxic respiratory failure: No longer intubated.  Much improved  # Fevers; w/u and mgmt per primary team. Possible aspiration     Subjective: Had HD yesterday. Minimally interactive today. Ongoing pain but no other complaints.  Objective Vital signs in last 24 hours: Vitals:   09/03/24 0840 09/03/24 0855 09/03/24 0859 09/03/24 0902  BP:   (!) 149/51 (!) 151/53  Pulse: 86 90 91   Resp: (!) 21 (!) 23    Temp: 99.3 F (37.4 C) 99.3 F (37.4 C)    TempSrc: Bladder Bladder    SpO2: 93% 92%    Weight:      Height:       Weight change: 0 kg  Intake/Output Summary (Last 24 hours) at 09/03/2024 0956 Last data filed at 09/03/2024 0600 Gross per 24 hour  Intake 1914.6 ml  Output 1455 ml  Net 459.6 ml       Labs: RENAL PANEL Recent Labs  Lab 08/30/24 0448 08/31/24 0424 08/31/24 0500 09/01/24 0356 09/02/24 0329 09/03/24 0412  NA 133*  134*  --  132* 130* 134*  K 3.7 3.3*  --  3.8 3.4* 3.1*  CL 99 100  --  97* 94* 96*  CO2 22 19*  --  17* 18* 26  GLUCOSE 115* 150*  --  131* 136* 129*  BUN 37* 71*  --  96* 101* 54*  CREATININE 0.96 1.70*  --  2.22* 2.64* 1.84*  CALCIUM 8.1* 7.8*  --  8.0* 7.9* 7.6*  MG 2.4  --  2.1 2.0 1.9 1.7  PHOS 2.8 4.9*  --  7.2* 7.0* 4.5  ALBUMIN  2.7* 2.6*  --  2.6* 2.8*  2.7* 2.5*    Liver Function Tests: Recent Labs  Lab 08/28/24 0404 08/28/24 1602 08/29/24 0418 08/29/24 1510 09/01/24 0356 09/02/24 0329 09/03/24 0412  AST 25  --  49*  --   --  28  --   ALT 20  --  45*  --   --  27  --   ALKPHOS 127*  --  127*  --   --  97  --   BILITOT 0.3  --  0.4  --   --  0.3  --   PROT 5.3*  --  5.7*  --   --  5.1*  --   ALBUMIN  2.5*   < > 2.8*   < > 2.6* 2.8*  2.7* 2.5*   < > = values in this  interval not displayed.   No results for input(s): LIPASE, AMYLASE in the last 168 hours. No results for input(s): AMMONIA in the last 168 hours. CBC: Recent Labs    08/30/24 0448 08/31/24 1010 09/01/24 1026 09/02/24 0744 09/03/24 0412  HGB 9.5* 8.1* 8.1* 6.9* 6.6*  MCV 90.4 92.0 91.7 93.5 93.1    Cardiac Enzymes: No results for input(s): CKTOTAL, CKMB, CKMBINDEX, TROPONINI in the last 168 hours. CBG: Recent Labs  Lab 09/02/24 1626 09/02/24 2002 09/02/24 2354 09/03/24 0418 09/03/24 0825  GLUCAP 146* 130* 106* 101* 132*    Iron Studies: No results for input(s): IRON, TIBC, TRANSFERRIN, FERRITIN in the last 72 hours. Studies/Results: DG CHEST PORT 1 VIEW Result Date: 09/02/2024 CLINICAL DATA:  10031 Cough 10031 EXAM: PORTABLE CHEST 1 VIEW COMPARISON:  08/30/2024. FINDINGS: Low lung volume. Redemonstration of diffuse increased interstitial markings throughout bilateral lungs, increased since the prior study. Findings are nonspecific but concerning for combination of atypical pneumonia with superimposed worsening pulmonary edema. Correlate clinically. There is subtle  blunting of bilateral lateral costophrenic angles, which may represent trace pleural effusions. Stable cardio-mediastinal silhouette. No acute osseous abnormalities. The soft tissues are within normal limits. Upper esophageal stent noted. There is right IJ hemodialysis catheter with its tip overlying the cavoatrial junction region. Right-sided PICC line noted with its tip overlying the lower portion of superior vena cava. IMPRESSION: *Redemonstration of diffuse increased interstitial markings throughout bilateral lungs, increased since the prior study. Findings are nonspecific but concerning for combination of atypical pneumonia with superimposed worsening pulmonary edema. Electronically Signed   By: Ree Molt M.D.   On: 09/02/2024 09:23    Medications: Infusions:  ampicillin -sulbactam (UNASYN ) IV Stopped (09/03/24 0005)   clevidipine  21 mg/hr (09/03/24 0926)   magnesium  sulfate bolus IVPB     micafungin  (MYCAMINE ) 100 mg in sodium chloride  0.9 % 100 mL IVPB 100 mg (09/03/24 0928)   promethazine  (PHENERGAN ) injection (IM or IVPB) Stopped (08/31/24 0008)   TPN ADULT (ION) 35 mL/hr at 09/03/24 0600    Scheduled Medications:  sodium chloride    Intravenous Once   amLODipine   10 mg Oral Daily   carvedilol   25 mg Oral BID WC   Chlorhexidine  Gluconate Cloth  6 each Topical Daily   cloNIDine   0.3 mg Transdermal Weekly   fentaNYL   1 patch Transdermal Q72H   furosemide   80 mg Intravenous Daily   Gerhardt's butt cream   Topical Daily   hydrALAZINE   100 mg Oral Q8H   insulin  aspart  0-20 Units Subcutaneous Q4H   ipratropium-albuterol   3 mL Nebulization BID   lidocaine   2 patch Transdermal Q24H   neomycin -bacitracin -polymyxin   Topical BID   nitroGLYCERIN   0.3 mg Transdermal Daily   pantoprazole  (PROTONIX ) IV  40 mg Intravenous Q12H   sodium chloride  flush  10-40 mL Intracatheter Q12H    have reviewed scheduled and prn medications.  Physical Exam: General: Chronically ill-appearing, lying  in bed, no distress Heart: Normal rate, no rub Lungs: bilateral chest rise, mild increased work of breathing Abdomen:soft, Non-tender, non-distended Extremities:trace edema Neurology: Alert awake and following command Dialysis access: Right IJ temporary HD catheter placed by PCCM on 1/29.  Jefferson Keylen Eckenrode 09/03/2024,9:56 AM  LOS: 39 days

## 2024-09-03 NOTE — Progress Notes (Signed)
 Physical Therapy Treatment Patient Details Name: [name] MRN: [id number] DOB: [birth year] Today's Date: 09/03/2024   History of Present Illness The pt is a 69 yo female presenting 07/26/24 for Right middle lobectomy to remove non-small cell carcinoma and resection of esophogeal diverticulum. Returned to OR 08/04/24 for EGD with esophageal stent, s/p esophageal perforation. 08/06/24 return to OR for replacement of stent due to migration into stomach. 1/16 bronchoscopy, and bronchoalveolar lavage, replacement of new esophagus stent with clipping, 08/16/24 upper GI endoscopy. New AKI. Pt desaturating requiring NRB on 1/24. Intubated 1/26-1/31. EFY:unajrrn abuse, positive tuberculosis test as a young woman, hypertension, hyperlipidemia, heart murmur, DM II, and anxiety.    PT Comments  The pt is making good functional progress. She does get anxious with mobility progression and benefits from encouragement from her family. She was able to come to long sit in bed with just minA today, but needed modA to manage her legs and pivot her buttocks to EOB due to noted deficits in lower extremity motor planning, initiation, and strength. She was able to stand with minAx2 and transfer OOB to the recliner using the stedy today. She does display lower extremity weakness with poor eccentric control when transferring from sitting from standing, needing modAx2. Completed several seated lower extremity AROM exercises to address her noted weakness. She remains at risk for falls and is demonstrating improved activity tolerance and making good, quick progress now. Thus, she could be a good candidate for intensive inpatient rehab, > 3 hours/day. Will continue to follow acutely.    If plan is discharge home, recommend the following: Two people to help with walking and/or transfers;Two people to help with bathing/dressing/bathroom;Assistance with cooking/housework;Assistance with feeding;Direct supervision/assist for medications  management;Direct supervision/assist for financial management;Assist for transportation;Help with stairs or ramp for entrance;Supervision due to cognitive status   Can travel by private vehicle        Equipment Recommendations  Wheelchair (measurements PT);Wheelchair cushion (measurements PT);BSC/3in1;Hospital bed;Rolling walker (2 wheels) (pending progress)    Recommendations for Other Services Rehab consult     Precautions / Restrictions Precautions Precautions: Other (comment);Fall Recall of Precautions/Restrictions: Impaired Precaution/Restrictions Comments: TPN, flexiseal, foley Restrictions Weight Bearing Restrictions Per Provider Order: No     Mobility  Bed Mobility Overal bed mobility: Needs Assistance Bed Mobility: Supine to Sit     Supine to sit: Mod assist, +2 for safety/equipment, HOB elevated     General bed mobility comments: Pt able to come to long sitting in bed with minA, but needed modA to scoot her hips to pivot to EOB using the bed pad. She had difficulty motor planning moving her legs off EOB, needing assistance to direct her legs as well.    Transfers Overall transfer level: Needs assistance Equipment used: Ambulation equipment used Transfers: Sit to/from Stand, Bed to chair/wheelchair/BSC Sit to Stand: Min assist, +2 physical assistance, +2 safety/equipment, Mod assist, From elevated surface           General transfer comment: Pt needed multi-modal cues and a count down from 3 to initiate transfer to stand 1x from elevated EOB and 1x from stedy flaps. Bed pad used to facilitate hip extension. MinA x2 to power up to stand each rep but modAx2 needed for eccentric control to sit down in low chair at the end after transfer OOB to the chair using the stedy Transfer via Lift Equipment: Stedy  Ambulation/Gait               General Gait Details: unable at  this time   Optometrist     Tilt Bed    Modified  Rankin (Stroke Patients Only)       Balance Overall balance assessment: Needs assistance Sitting-balance support: Single extremity supported, Bilateral upper extremity supported, Feet supported Sitting balance-Leahy Scale: Poor Sitting balance - Comments: UE support to sit statically with intermittent minA for balance   Standing balance support: Bilateral upper extremity supported, During functional activity, Reliant on assistive device for balance Standing balance-Leahy Scale: Poor Standing balance comment: Reliant on UE support and intermittent minA to stand statically in stedy                            Communication Communication Communication: Impaired Factors Affecting Communication: Reduced clarity of speech;Difficulty expressing self  Cognition Arousal: Lethargic, Alert Behavior During Therapy: Flat affect, Anxious (irritable)   PT - Cognitive impairments: Attention, Initiation, Sequencing, Problem solving, Safety/Judgement, Awareness, Memory                       PT - Cognition Comments: Pt needs encouragement to participate but she was overall more agreeable today, especially with encouragement from her family. She can get anxious and then irritable when anxious, stating I think we did too much during difficult scary tasks, like transferring OOB. Pt with delayed initiation and poor motor planning, especially with legs, needing multi-modal cues to sequence movements. Needs redirecting Following commands: Impaired Following commands impaired: Follows one step commands with increased time, Follows multi-step commands inconsistently, Follows multi-step commands with increased time    Cueing Cueing Techniques: Verbal cues, Tactile cues, Gestural cues, Visual cues  Exercises General Exercises - Lower Extremity Long Arc Quad: AROM, Strengthening, Both, 5 reps, Seated (x5 sec isometric hold) Hip Flexion/Marching: AROM, Strengthening, Both, 5 reps, Seated     General Comments General comments (skin integrity, edema, etc.): VSS on 2L      Pertinent Vitals/Pain Pain Assessment Pain Assessment: Faces Faces Pain Scale: Hurts little more Pain Location: back Pain Descriptors / Indicators: Discomfort, Grimacing, Guarding, Sore Pain Intervention(s): Limited activity within patient's tolerance, Monitored during session, Premedicated before session, Repositioned    Home Living                          Prior Function            PT Goals (current goals can now be found in the care plan section) Acute Rehab PT Goals Patient Stated Goal: to feel better and go home to her dog PT Goal Formulation: With patient/family Time For Goal Achievement: 09/11/24 Potential to Achieve Goals: Fair Progress towards PT goals: Progressing toward goals    Frequency    Min 3X/week      PT Plan      Co-evaluation   Reason for Co-Treatment: For patient/therapist safety;To address functional/ADL transfers;Necessary to address cognition/behavior during functional activity PT goals addressed during session: Mobility/safety with mobility;Balance;Proper use of DME;Strengthening/ROM OT goals addressed during session: ADL's and self-care;Strengthening/ROM      AM-PAC PT 6 Clicks Mobility   Outcome Measure  Help needed turning from your back to your side while in a flat bed without using bedrails?: A Lot Help needed moving from lying on your back to sitting on the side of a flat bed without using bedrails?: A Lot Help needed moving to and  from a bed to a chair (including a wheelchair)?: Total Help needed standing up from a chair using your arms (e.g., wheelchair or bedside chair)?: Total Help needed to walk in hospital room?: Total Help needed climbing 3-5 steps with a railing? : Total 6 Click Score: 8    End of Session Equipment Utilized During Treatment: Gait belt;Oxygen Activity Tolerance: Patient tolerated treatment well Patient left: in  chair;with call bell/phone within reach;with nursing/sitter in room;with family/visitor present Nurse Communication: Mobility status;Need for lift equipment (stedy, no chair alarm in chair) PT Visit Diagnosis: Unsteadiness on feet (R26.81);Muscle weakness (generalized) (M62.81);Difficulty in walking, not elsewhere classified (R26.2);Pain     Time: 1221-1254 PT Time Calculation (min) (ACUTE ONLY): 33 min  Charges:    $Therapeutic Exercise: 8-22 mins $Therapeutic Activity: 8-22 mins PT General Charges $$ ACUTE PT VISIT: 1 Visit                     Theo Ferretti, PT, DPT Acute Rehabilitation Services  Office: [phone]    Theo CHRISTELLA Ferretti 09/03/2024, 5:47 PM

## 2024-09-03 NOTE — Progress Notes (Signed)
 Renal artery doppler has been completed.  Results can be found in chart review under CV Proc.  09/03/2024 12:26 PM  Sherine Cortese Elden Appl, RVT.

## 2024-09-03 NOTE — Progress Notes (Signed)
 18 Days Post-Op Procedures (LRB): EGD (ESOPHAGOGASTRODUODENOSCOPY) (N/A) CONTROL OF HEMORRHAGE, GI TRACT, ENDOSCOPIC, BY CLIPPING OR OVERSEWING Subjective: Unable to undergo swallow eval yesterday due to lethargy Still on high dose of clevidipine  Making urine  Objective: Vital signs in last 24 hours: Temp:  [98.8 F (37.1 C)-100.4 F (38 C)] 99.7 F (37.6 C) (02/06 0615) Pulse Rate:  [75-96] 88 (02/06 0615) Cardiac Rhythm: Normal sinus rhythm (02/05 1200) Resp:  [0-28] 24 (02/06 0615) BP: (96-165)/(39-55) 165/47 (02/05 1719) SpO2:  [88 %-98 %] 93 % (02/06 0615) Arterial Line BP: (122-179)/(36-70) 154/51 (02/06 0615) Weight:  [82.1 kg-82.4 kg] 82.1 kg (02/06 0433)  Hemodynamic parameters for last 24 hours: CVP:  [1 mmHg-38 mmHg] 8 mmHg  Intake/Output from previous day: 02/05 0701 - 02/06 0700 In: 2068.3 [I.V.:1733.3; IV Piggyback:305] Out: 1630 [Urine:1470; Stool:100; Chest Tube:60] Intake/Output this shift: No intake/output data recorded.  General appearance: Opens eyes, very sleepy Neurologic: intact Heart: regular rate and rhythm Lungs: diminished breath sounds bibasilar Abdomen: normal findings: soft, non-tender  Lab Results: Recent Labs    09/02/24 0744 09/03/24 0412  WBC 25.6* 19.2*  HGB 6.9* 6.6*  HCT 20.1* 18.8*  PLT 164 126*   BMET:  Recent Labs    09/02/24 0329 09/03/24 0412  NA 130* 134*  K 3.4* 3.1*  CL 94* 96*  CO2 18* 26  GLUCOSE 136* 129*  BUN 101* 54*  CREATININE 2.64* 1.84*  CALCIUM 7.9* 7.6*    PT/INR: No results for input(s): LABPROT, INR in the last 72 hours. ABG    Component Value Date/Time   PHART 7.297 (L) 08/26/2024 0735   HCO3 19.2 (L) 08/26/2024 0735   TCO2 20 (L) 08/26/2024 0735   ACIDBASEDEF 7.0 (H) 08/26/2024 0735   O2SAT 93 08/26/2024 0735   CBG (last 3)  Recent Labs    09/02/24 2002 09/02/24 2354 09/03/24 0418  GLUCAP 130* 106* 101*    Assessment/Plan: S/P Procedures (LRB): EGD  (ESOPHAGOGASTRODUODENOSCOPY) (N/A) CONTROL OF HEMORRHAGE, GI TRACT, ENDOSCOPIC, BY CLIPPING OR OVERSEWING - NEURO- intact CV- in SR, CVP 8-10 Hypertension- remains extremely difficult to control, continue clevidipine   Pheo work up pending RESP- sat Ok on 1-2L [place]   Report of coughing with PO intake  Speech eval when possible RENAL- nonoliguric ARF  iHD per nephrology ENDO- CBG mildly elevated GI- taking limited POs  On TNA for nutritional support  Gen surg to evaluation for surgical GJ tube ID- on Unasyn / micafungin  Deconditioning- severe   LOS: 39 days    Con RAMAN Emri Sample 09/03/2024

## 2024-09-03 NOTE — Progress Notes (Signed)
 Occupational Therapy Treatment Patient Details Name: [name] MRN: [id number] DOB: 09/10/1955 Today's Date: 09/03/2024   History of present illness The pt is a 69 yo female presenting 07/26/24 for Right middle lobectomy to remove non-small cell carcinoma and resection of esophogeal diverticulum. Returned to OR 08/04/24 for EGD with esophageal stent, s/p esophageal perforation. 08/06/24 return to OR for replacement of stent due to migration into stomach. 1/16 bronchoscopy, and bronchoalveolar lavage, replacement of new esophagus stent with clipping, 08/16/24 upper GI endoscopy. New AKI. Pt desaturating requiring NRB on 1/24. Intubated 1/26-1/31. EFY:unajrrn abuse, positive tuberculosis test as a young woman, hypertension, hyperlipidemia, heart murmur, DM II, and anxiety.   OT comments  Pt able to progress OOB today with use of +2 mod A and Stedy. Level of arousal improved tremendously once upright and OOB. Pt overall mod to max A with ADL tasks due to decreased on-line awareness, impaired attention and executive level skills and  generalized weakness. Daughter present and supportive throughout session. Discussed importance of getting up to chair everyday with staff. VSS on 2L throughout session. Patient will benefit from intensive inpatient follow-up therapy, >3 hours/day to maximize functional level of independence with goal of returning home with assistance of family. Acute OT to follow.       If plan is discharge home, recommend the following:  A lot of help with walking and/or transfers;Two people to help with walking and/or transfers;A little help with bathing/dressing/bathroom;Assistance with cooking/housework;Assist for transportation;Help with stairs or ramp for entrance   Equipment Recommendations  Other (comment);BSC/3in1    Recommendations for Other Services Rehab consult    Precautions / Restrictions Precautions Precautions: Other (comment);Fall Recall of Precautions/Restrictions:  Impaired Precaution/Restrictions Comments: TPN, flexiseal, foley Restrictions Weight Bearing Restrictions Per Provider Order: No       Mobility Bed Mobility Overal bed mobility: Needs Assistance Bed Mobility: Supine to Sit     Supine to sit: Mod assist, +2 for safety/equipment  Mod vc for sequencing to move EOB        Transfers Overall transfer level: Needs assistance                 General transfer comment: +2 mod A; uncontrolled descent Transfer via Lift Equipment: Stedy   Balance Overall balance assessment: Needs assistance Sitting-balance support: Single extremity supported, Bilateral upper extremity supported, Feet supported Sitting balance-Leahy Scale: Fair       Standing balance-Leahy Scale: Poor Standing balance comment: midline in Stedy                           ADL either performed or assessed with clinical judgement   ADL Overall ADL's : Needs assistance/impaired Eating/Feeding: Minimal assistance Eating/Feeding Details (indicate cue type and reason): can bring cup to her mouth to drink Grooming: Minimal assistance                                 General ADL Comments: daughteer presen - educated on need to have her mom do what she can for herself    Extremity/Trunk Assessment Upper Extremity Assessment Upper Extremity Assessment: Generalized weakness            Vision   Additional Comments: weras glasses   Perception     Praxis     Communication Communication Communication: Impaired Factors Affecting Communication: Reduced clarity of speech;Difficulty expressing self   Cognition Arousal: Lethargic, Alert Behavior During  Therapy: Flat affect (irritable); anxious Cognition: Cognition impaired     Awareness: Online awareness impaired Memory impairment (select all impairments): Working civil service fast streamer, Conservation officer, historic buildings Attention impairment (select first level of impairment): Sustained  attention Executive functioning impairment (select all impairments): Initiation, Organization, Sequencing, Reasoning, Problem solving                   Following commands: Impaired Following commands impaired: Follows one step commands with increased time      Cueing   Cueing Techniques: Verbal cues, Tactile cues, Gestural cues, Visual cues  Exercises Exercises: General Upper Extremity (pt declined EOB exercises) General Exercises - Upper Extremity Shoulder Flexion: Both, AAROM, 10 reps Elbow Flexion: Both, AROM, 10 reps Elbow Extension: Both, AROM, 10 reps    Shoulder Instructions       General Comments VSS on 2L; once up in [place] pt became more anxious I don't like this. Educated pt/daughter on importance of mobility and the use of equipment to help achieve her goal of getting stronger with eventual DC home    Pertinent Vitals/ Pain       Pain Assessment Pain Assessment: Faces Faces Pain Scale: Hurts little more Pain Location: back Pain Descriptors / Indicators: Discomfort, Grimacing, Guarding, Sore Pain Intervention(s): Limited activity within patient's tolerance  Home Living                                          Prior Functioning/Environment              Frequency  Min 2X/week        Progress Toward Goals  OT Goals(current goals can now be found in the care plan section)  Progress towards OT goals: Progressing toward goals  Acute Rehab OT Goals OT Goal Formulation: With patient Time For Goal Achievement: 09/06/24 Potential to Achieve Goals: Good  Plan      Co-evaluation    PT/OT/SLP Co-Evaluation/Treatment: Yes Reason for Co-Treatment: For patient/therapist safety;To address functional/ADL transfers;Necessary to address cognition/behavior during functional activity          AM-PAC OT 6 Clicks Daily Activity     Outcome Measure   Help from another person eating meals?: A Lot Help from another person taking care  of personal grooming?: A Lot Help from another person toileting, which includes using toliet, bedpan, or urinal?: Total Help from another person bathing (including washing, rinsing, drying)?: A Lot Help from another person to put on and taking off regular upper body clothing?: A Lot Help from another person to put on and taking off regular lower body clothing?: A Lot 6 Click Score: 11    End of Session Equipment Utilized During Treatment: Oxygen (2L)  OT Visit Diagnosis: Unsteadiness on feet (R26.81);Muscle weakness (generalized) (M62.81);Other symptoms and signs involving cognitive function;Pain;Other abnormalities of gait and mobility (R26.89) Pain - part of body:  (back)   Activity Tolerance Patient tolerated treatment well   Patient Left with call bell/phone within reach;in chair;with family/visitor present   Nurse Communication Mobility status;Need for lift equipment (stedy)        Time: 8783-8744 OT Time Calculation (min): 39 min  Charges: OT General Charges $OT Visit: 1 Visit OT Treatments $Self Care/Home Management : 8-22 mins Kreg Sink, OT/L   Acute OT Clinical Specialist Acute Rehabilitation Services Pager [phone] Office [phone]   [facility] 09/03/2024, 1:12 PM

## 2024-09-03 NOTE — Progress Notes (Signed)
 PHARMACY - TOTAL PARENTERAL NUTRITION CONSULT NOTE  Indication: Esophageal leak  Patient Measurements: Height: 5' 2.99 (160 cm) Weight: 82.1 kg (181 lb) IBW/kg (Calculated) : 52.38 TPN AdjBW (KG): 55.8 Body mass index is 32.07 kg/m.  Assessment:  6 YOF with NSCLC here for RML lobectomy, resection of esophageal diverticulum, and lymph node dissection on 12/29.  Found to have a small contained leak post-op.   Patient reports an intentional weight loss of 40 lbs over a couple of years when she was diagnosed with DM.  She typically drinks one Orgain shake every morning for breakfast and eats one meal a day, which could be pizza, spaghetti, chicken, beef or fish along with vegetables and fruits.  Patient was eating normally until her planned surgery. RD concerned with patient being malnourished. Patient was on TPN 12/31 - 1/12 and was advanced to dysphagia 1 diet. However, she was NPO again 1/14-1/16. On 1/17 IR reported small amount of leak from distal esophageal stent. Patient developed AKI since TPN was held. Pharmacy reconsulted for TPN 1/17.  Patient intubated overnight on 1/27. Currently on propofol  and cleviprex . Trial of nicardipine  on 1/26 without adequate BP control. Requiring propofol  for sedation, potential extubation on 1/30 so team would like to avoid versed . Precedex  likely with inadequate effect for full wean. Potential for Jtube / PEG at the end of the week per TCTS.   1/30: CRRT started yesterday 1/29, glc control improving. TG noted.  1/31: TG down after propofol  stopped >> changed to precedex , clevi down to 2 mg/hr, reglan  stopped 1/30  2/3 Off CRRT, diuresing w/ K supplementation. Remains on clevidipine  at high dose.  2/4 Patient able to swallow and started on clear liquids yesterday. Coreg  started yesterday but remains on high doses of clevidipine .   Glucose / Insulin : hx DM, A1c 5.9% - CBG<180 10 units SSI/24h with 12u insulin  in TPN (previously 20u, was stopped when TPN  was adjusted for cleviprex )   Electrolytes: Na 869, K 3.1, CO2 26 Phos 7.0 (none in TPN, removed 2/3), CoCa: 8.9 Renal: CRRT stopped 2/2. Transition to iHD -Last iHD completed x 3h 2/5  Hepatic: LFTs WNL, tbili WNL, albumin  2.8, TG 314 2/6 (clevidipine  added on 1/26)  Intake / Output; MIVF: UOP 0.7 mL/kg/hr (on lasix  80 daily), LBM 2/6 diarrhea, chest tube 60 mL Net IO Since Admission: 24,866.24 mL [09/03/24 0922]  GI Imaging:   12/30 esophagram: Positive for contrast extravasation and an esophageal leak in the mid esophagus 1/2 esophagram: Persistent contrast extravasation and esophageal leak, similar to prior 1/6 esophagram: Limited contrast study due to patient aspiration and severe coughing spell. Study aborted. 1/12 esophagram: Patent esophageal stent. No evidence for extravasation or leak, Delayed esophageal emptying and gastroesophageal reflux leading to stasis of contrast in the esophagus. 1/15 CT: displaced esophageal stent in stomach, no bowel obstruction  1/17 esophagram: small volume esophageal leak at distal stent 1/20 esophagram: no evidence of any leak  1/28 KUB: Nonobstructive bowel gas pattern  1/29 KUB: bowel gas pattern is normal 1/30 KUB: Nonobstructive bowel gas pattern 2/3 esophagram no leak, mild esophageal dysmotility, stent in place   GI Surgeries / Procedures:  12/30 right middle lobectomy, resection esophageal diverticulum, lymph node biopsy  1/7 EGD- stent placement  1/9 EGD stent repositioned d/t migration to stomach  1/16 EGD- stent placed at esophageal ulcer, removed gastric stent, normal duoenum  1/19 EGD - stent replaced / repositioned   Central access: PICC 07/28/24 TPN start date: 07/28/24 > 1/12; 1/17 >  Nutritional Goals:  Goal unconcentrated TPN rate 75 ml/hr to provide 101g AA and 1721 kCal Goal concentrated TPN rate 60 mL/hr to provide 101 g AA and 1743 kCal  Temporary goal off CRRT while on high dose clevidipine  will be 35 ml/hr to provide  ~ 697 kcal and 1.2 g/kg AA (67.2 g), and 126 g dextrose . This provides 1847 kcal/day. Hopeful to wean clevidipine  off, and increase dextrose  / kcal provision from TPN soon.  RD Estimated Needs Total Energy Estimated Needs: 1700-1900 Total Protein Estimated Needs: 85-105 g Total Fluid Estimated Needs: >1.7L/day  Current Nutrition:  TPN 1/17 >> 1/08 CLD - passed swallow 1/09 CLD - patient feels can't swallow well > NPO 1/13 advanced to DYS1 1/14 NPO  1/21 FLD, Glucerna x1 on 1/21, milkshake per patient  1/24 NPO  1/31 ~05:00 TPN pump alerting for air-in-line though RN could not find any issue with it. Switched to D10 for 12 hours  2/3 CLD 2/4 FLD  Plan: Continue TPN without lipids to 35 ml/hr, provides 67 g protein and 697 kcals, meeting 78% of protein goals  and 100% of kcal goal with cleviprex    Cleviprex  infusing at 42 mL/hr at 2 kcal/ml, = ~2000 kcal per day at this rate  Electrolytes in TPN: Na 130 mEq/L, K 30 mEq/L, Ca 8 mEq/L, Mg 10 mEq/L, Phos 0 mmol/L. Cl:Ac 2:1 -modest adjustments given diuresis + dialysis with variable response. Will supplement outside TPN if needed Continue standard MVI and trace elements to TPN Continue resistant SSI to q4h and adjust as needed Mag 2g IV x1 outside TPN today; KCl 20 meq per primary team Continue 12 units of insulin  in TPN Monitor TPN labs on Mon/Thurs, and daily until stable On lasix  80 mg IV BID, plan for iHD on 2/7 per nephrology  Thank you for allowing pharmacy to be a part of this patients care.  Sharyne Glatter, PharmD, BCCCP Critical Care Clinical Pharmacist 09/03/2024 9:22 AM
